# Patient Record
Sex: Female | Born: 1955
Health system: Southern US, Community
[De-identification: ages and names within clinical notes are randomized; demographics above are authoritative.]

## PROBLEM LIST (undated history)

## (undated) DIAGNOSIS — C73 Malignant neoplasm of thyroid gland: Secondary | ICD-10-CM

## (undated) DIAGNOSIS — T8859XA Other complications of anesthesia, initial encounter: Secondary | ICD-10-CM

## (undated) DIAGNOSIS — Z9889 Other specified postprocedural states: Secondary | ICD-10-CM

## (undated) DIAGNOSIS — E039 Hypothyroidism, unspecified: Secondary | ICD-10-CM

## (undated) DIAGNOSIS — E785 Hyperlipidemia, unspecified: Secondary | ICD-10-CM

## (undated) DIAGNOSIS — D649 Anemia, unspecified: Secondary | ICD-10-CM

## (undated) DIAGNOSIS — J189 Pneumonia, unspecified organism: Secondary | ICD-10-CM

## (undated) DIAGNOSIS — R112 Nausea with vomiting, unspecified: Secondary | ICD-10-CM

## (undated) HISTORY — DX: Anemia, unspecified: D64.9

## (undated) HISTORY — PX: THYROIDECTOMY: SHX17

---

## 1998-01-08 ENCOUNTER — Other Ambulatory Visit: Admission: RE | Admit: 1998-01-08 | Discharge: 1998-01-08 | Payer: Self-pay | Admitting: Obstetrics and Gynecology

## 1998-12-12 ENCOUNTER — Inpatient Hospital Stay (HOSPITAL_COMMUNITY): Admission: EM | Admit: 1998-12-12 | Discharge: 1998-12-15 | Payer: Self-pay | Admitting: Internal Medicine

## 1998-12-12 ENCOUNTER — Encounter: Payer: Self-pay | Admitting: Internal Medicine

## 1999-03-11 ENCOUNTER — Encounter: Admission: RE | Admit: 1999-03-11 | Discharge: 1999-03-11 | Payer: Self-pay | Admitting: Obstetrics and Gynecology

## 1999-03-11 ENCOUNTER — Encounter: Payer: Self-pay | Admitting: Obstetrics and Gynecology

## 1999-05-25 ENCOUNTER — Other Ambulatory Visit: Admission: RE | Admit: 1999-05-25 | Discharge: 1999-05-25 | Payer: Self-pay | Admitting: Obstetrics and Gynecology

## 1999-07-09 ENCOUNTER — Encounter: Admission: RE | Admit: 1999-07-09 | Discharge: 1999-07-09 | Payer: Self-pay | Admitting: Urology

## 1999-07-09 ENCOUNTER — Encounter: Payer: Self-pay | Admitting: Urology

## 2000-06-21 ENCOUNTER — Other Ambulatory Visit: Admission: RE | Admit: 2000-06-21 | Discharge: 2000-06-21 | Payer: Self-pay | Admitting: Obstetrics and Gynecology

## 2000-08-23 ENCOUNTER — Other Ambulatory Visit: Admission: RE | Admit: 2000-08-23 | Discharge: 2000-08-23 | Payer: Self-pay | Admitting: Endocrinology

## 2001-07-11 ENCOUNTER — Other Ambulatory Visit: Admission: RE | Admit: 2001-07-11 | Discharge: 2001-07-11 | Payer: Self-pay | Admitting: Obstetrics and Gynecology

## 2002-02-27 ENCOUNTER — Encounter: Admission: RE | Admit: 2002-02-27 | Discharge: 2002-02-27 | Payer: Self-pay | Admitting: Obstetrics and Gynecology

## 2002-02-27 ENCOUNTER — Encounter: Payer: Self-pay | Admitting: Obstetrics and Gynecology

## 2002-09-10 ENCOUNTER — Other Ambulatory Visit: Admission: RE | Admit: 2002-09-10 | Discharge: 2002-09-10 | Payer: Self-pay | Admitting: Obstetrics and Gynecology

## 2003-03-20 ENCOUNTER — Encounter: Admission: RE | Admit: 2003-03-20 | Discharge: 2003-03-20 | Payer: Self-pay | Admitting: Obstetrics and Gynecology

## 2003-09-26 ENCOUNTER — Other Ambulatory Visit: Admission: RE | Admit: 2003-09-26 | Discharge: 2003-09-26 | Payer: Self-pay | Admitting: Obstetrics and Gynecology

## 2004-03-30 ENCOUNTER — Encounter: Admission: RE | Admit: 2004-03-30 | Discharge: 2004-03-30 | Payer: Self-pay | Admitting: Obstetrics and Gynecology

## 2004-09-23 ENCOUNTER — Other Ambulatory Visit: Admission: RE | Admit: 2004-09-23 | Discharge: 2004-09-23 | Payer: Self-pay | Admitting: Obstetrics and Gynecology

## 2005-07-29 IMAGING — CT CT CHEST W/O CM
2 of 6 series · 15 of 37 positions shown, 18 images · non-contrast
Comparison: NONE

CLINICAL DATA: Cough since [REDACTED].  Chest pain. 

CT CHEST FOR EVALUATION OF PULMONARY EMBOLI
TECHNIQUE: Spiral 3 millimeter thick axial images at 
3 mm increments were obtained from the lung apex through the upper 
abdomen after the administration of 73 cc of IV contrast.  Coronal 
reconstructions were obtained.

[Series 5: arterial 2x2 · axial · arterial · 0.68mm/px · z∈[+1437,+1715]mm · 12 of 167 slices shown, 15 images]
[im 14/167  mediastinal]
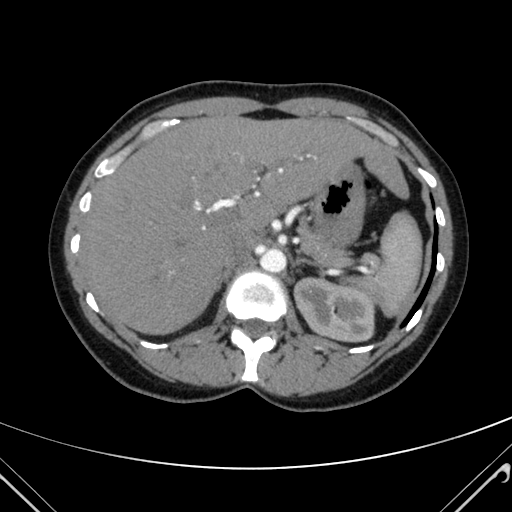
[im 14/167  lung]
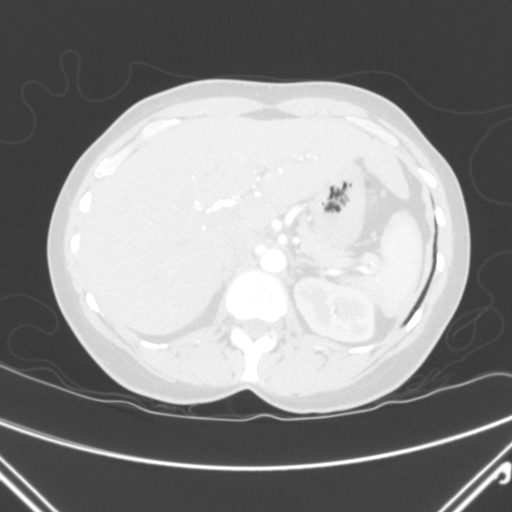
[im 28/167  lung]
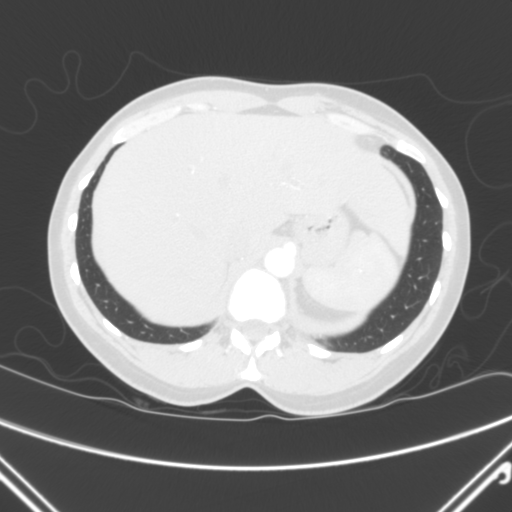
[im 42/167  lung]
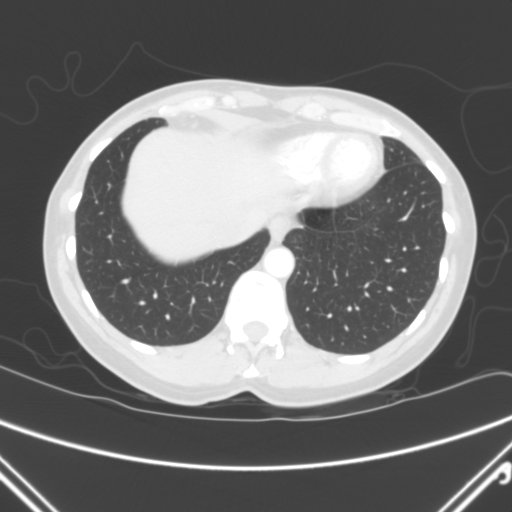
[im 56/167  lung]
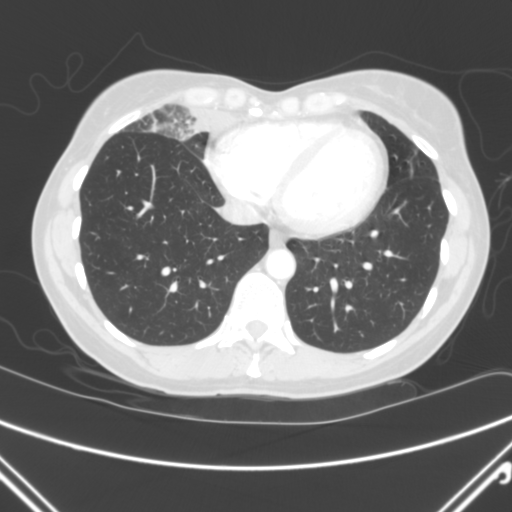
[im 70/167  mediastinal]
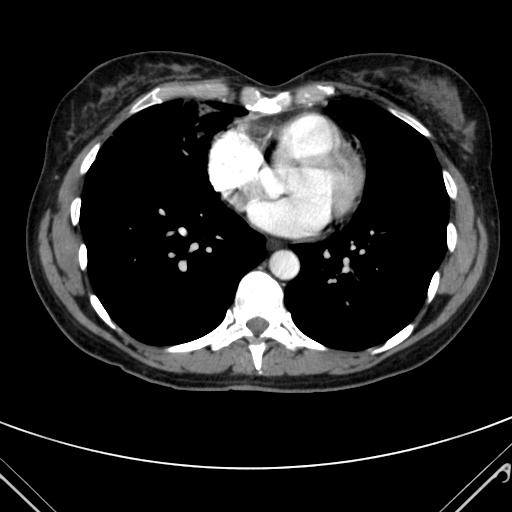
[im 70/167  lung]
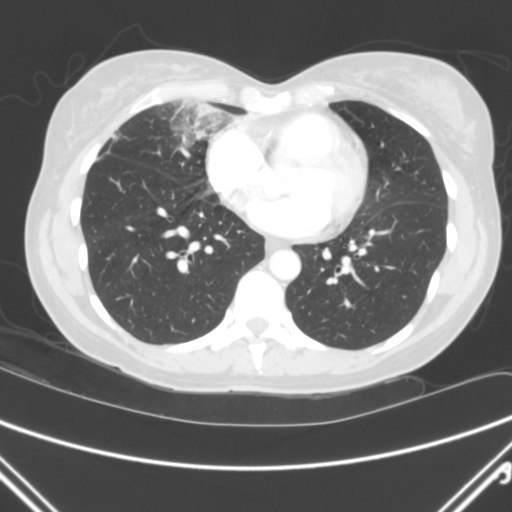
[im 81/167  lung]
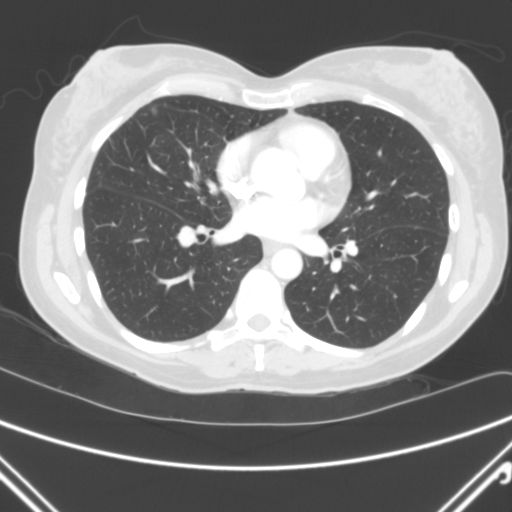
[im 84/167  lung]
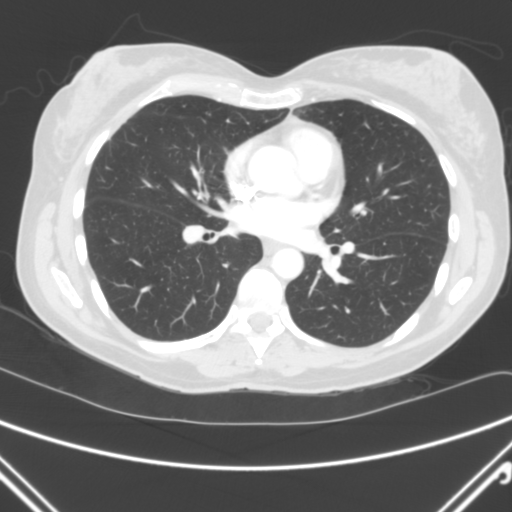
[im 97/167  lung]
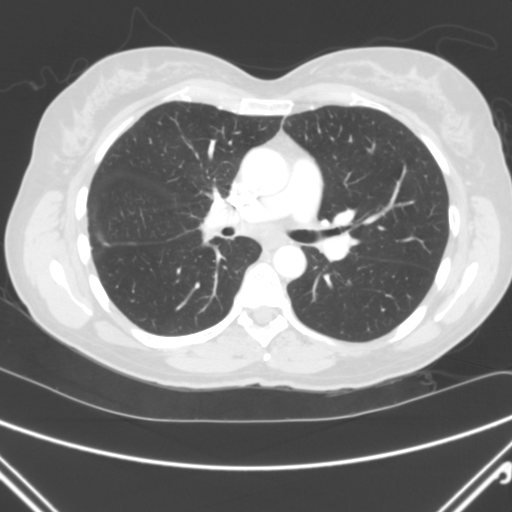
[im 111/167  mediastinal]
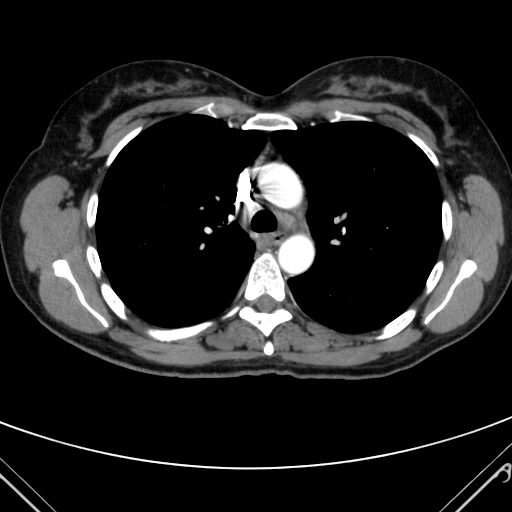
[im 111/167  lung]
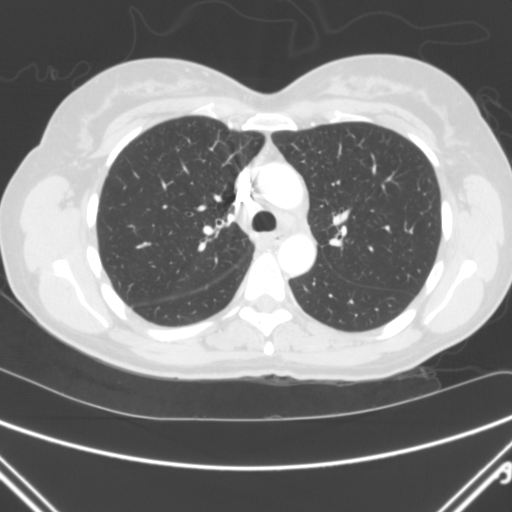
[im 125/167  lung]
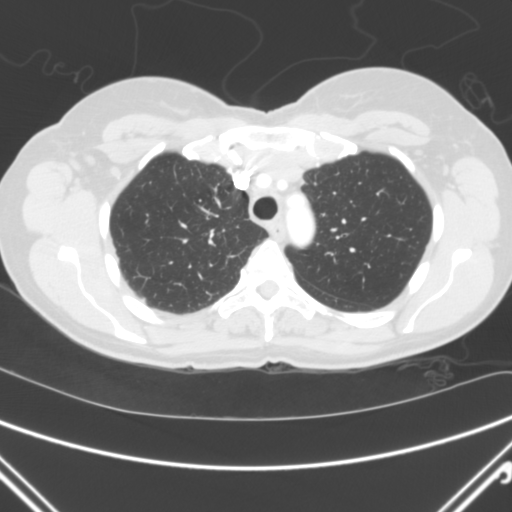
[im 139/167  lung]
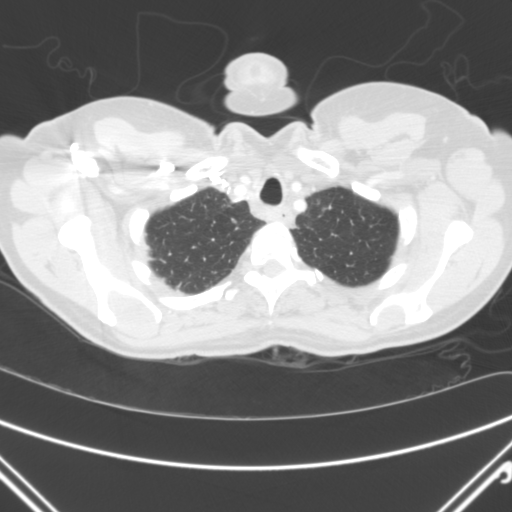
[im 153/167  lung]
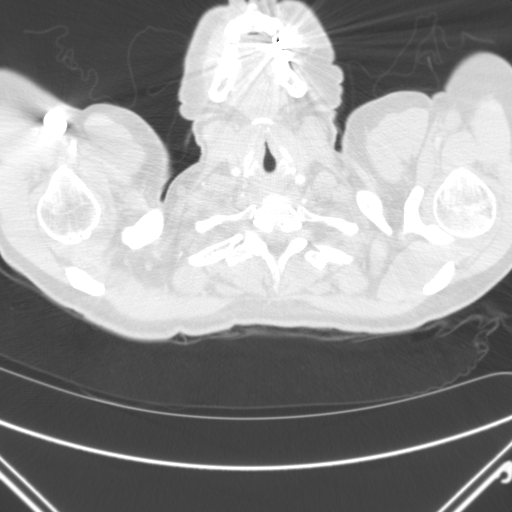

[coronals · coronal · 0.68mm/px · 3 of 102 slices shown]
[im 21/102  lung]
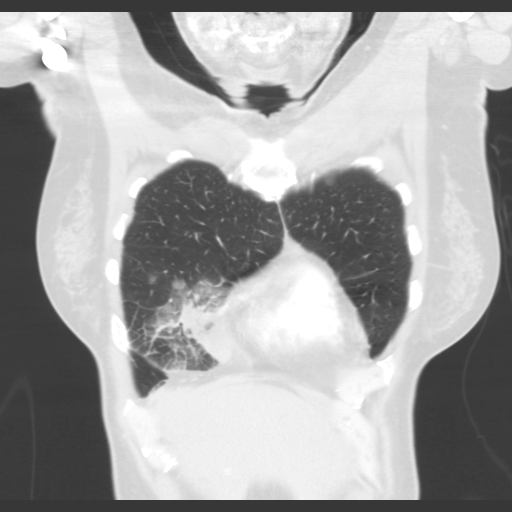
[im 41/102  lung]
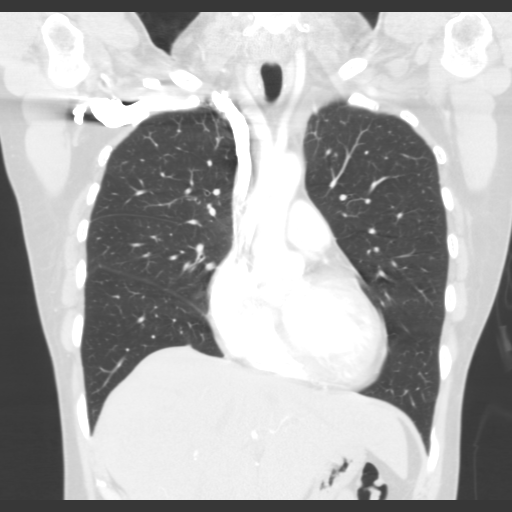
[im 61/102  lung]
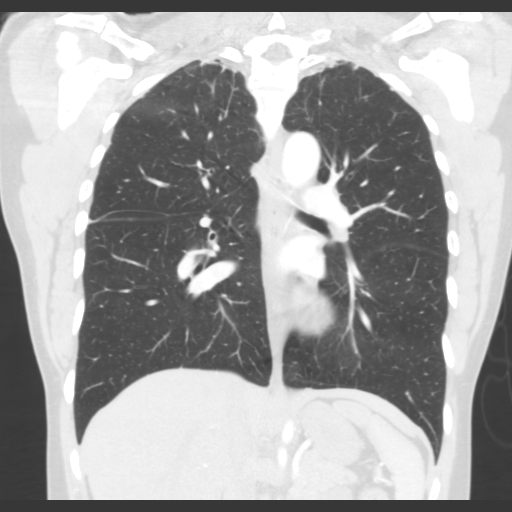

[15 of 37 positions shown; findings below may reference images not displayed]

FINDINGS: No axillary, mediastinal, or hilar mass or adenopathy is 
noted. Visualization of the pulmonary artery and the right and 
left pulmonary artery branches and primary pulmonary artery 
branches demonstrates no evidence of filling defect to suggest 
thromboembolic disease. The dominant finding is an area of 
parenchymal consolidation with minimal air bronchogram effect in 
the right middle lobe.  Corresponding abnormality is noted on a 
chest radiograph obtained on the same day.  No focal lung nodule 
or pleural effusion is noted.  The visualized portions of the 
upper abdominal structures are unremarkable.  No lytic or blastic 
lesions are identified.
IMPRESSION: Right middle lobe parenchymal process most likely an 
area of pneumonia.  Recommend follow-up chest radiograph and/or 
non-contrast CT in three to four weeks to document complete 
resolution of this process.  Neoplasm is unlikely but is a 
differential diagnostic consideration.  Other possible 
considerations would include an area of pulmonary infarction 
although this is unlikely in the absence of thromboembolic 
phenomenon. BREVAN IRIGOYEN electronically reviewed on 
[DATE] Dict Date: [DATE]  Tran Date: [DATE] DAS  [facility]

## 2005-08-16 ENCOUNTER — Encounter: Admission: RE | Admit: 2005-08-16 | Discharge: 2005-08-16 | Payer: Self-pay | Admitting: Obstetrics and Gynecology

## 2005-08-16 IMAGING — MG MM SCREEN MAMMOGRAM BILATERAL
4 series · 4 of 4 positions shown · non-contrast
Comparison: [DATE].

DG SCREEN MAMMOGRAM BILATERAL
Bilateral CC and MLO view(s) were taken.

SCREENING MAMMOGRAM:

[R CC]
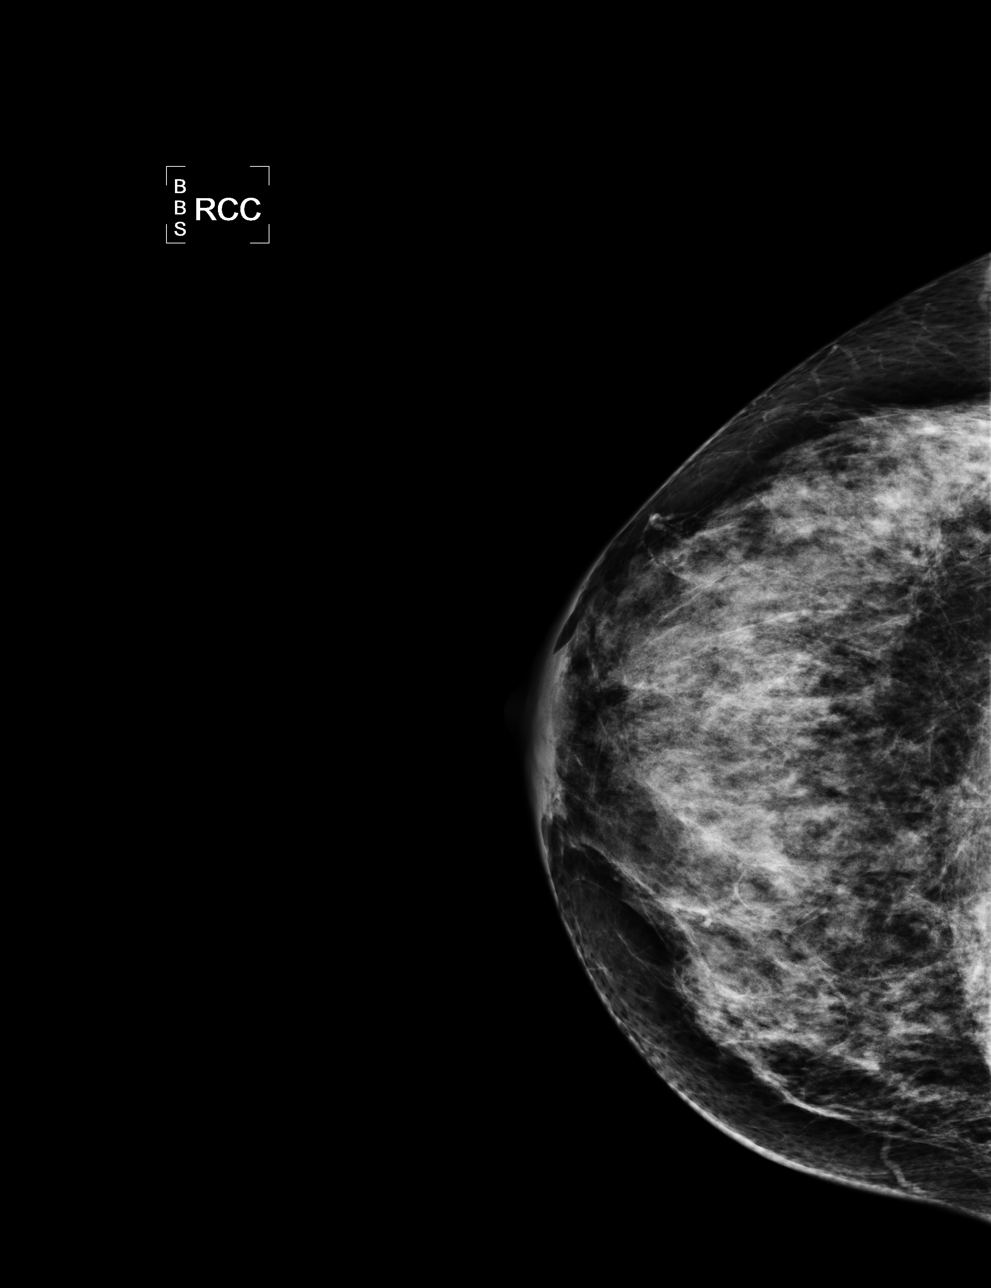

[L CC]
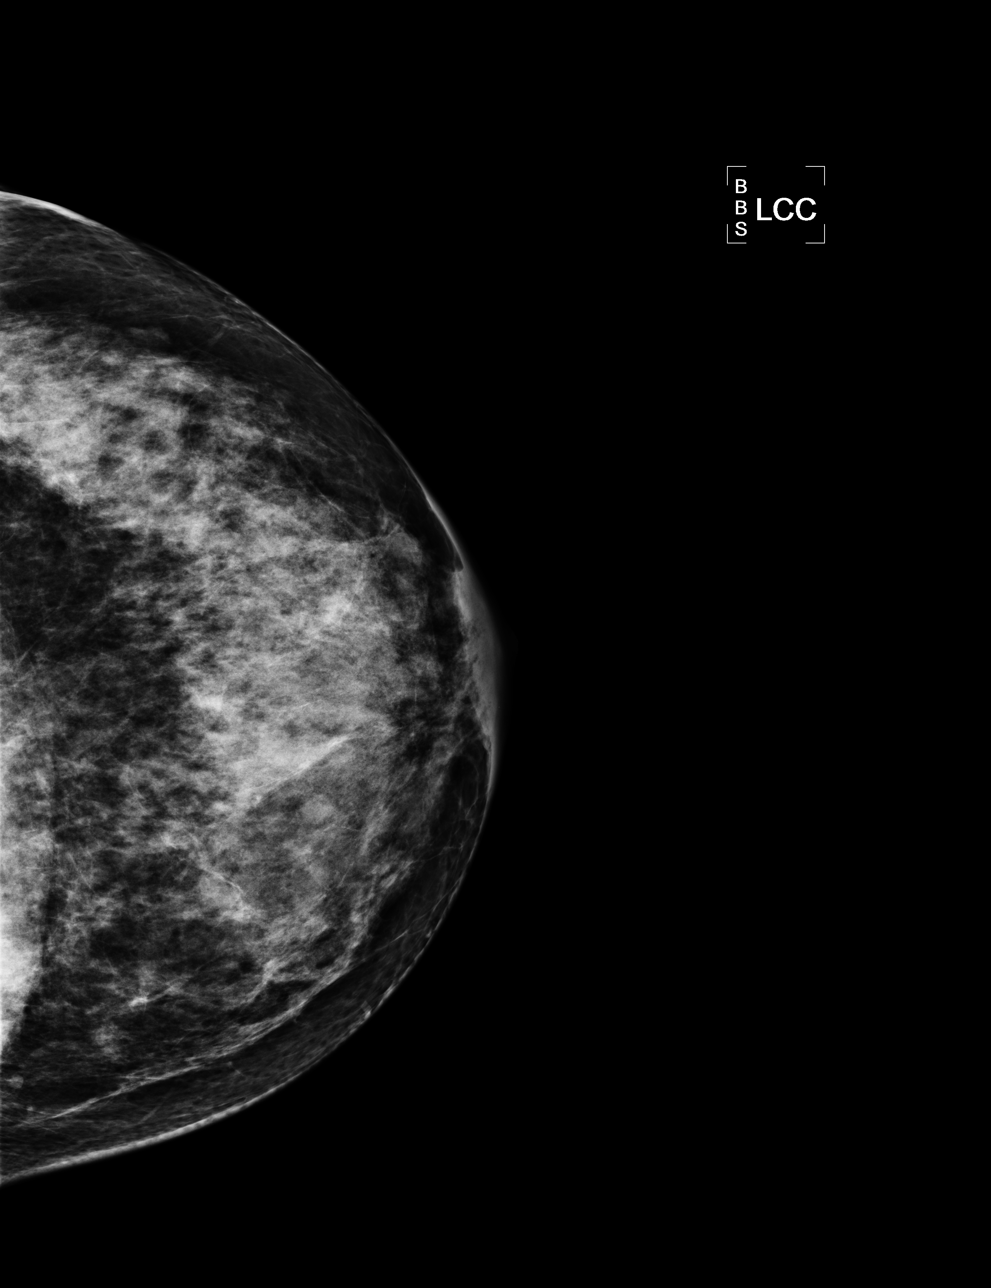

[L MLO]
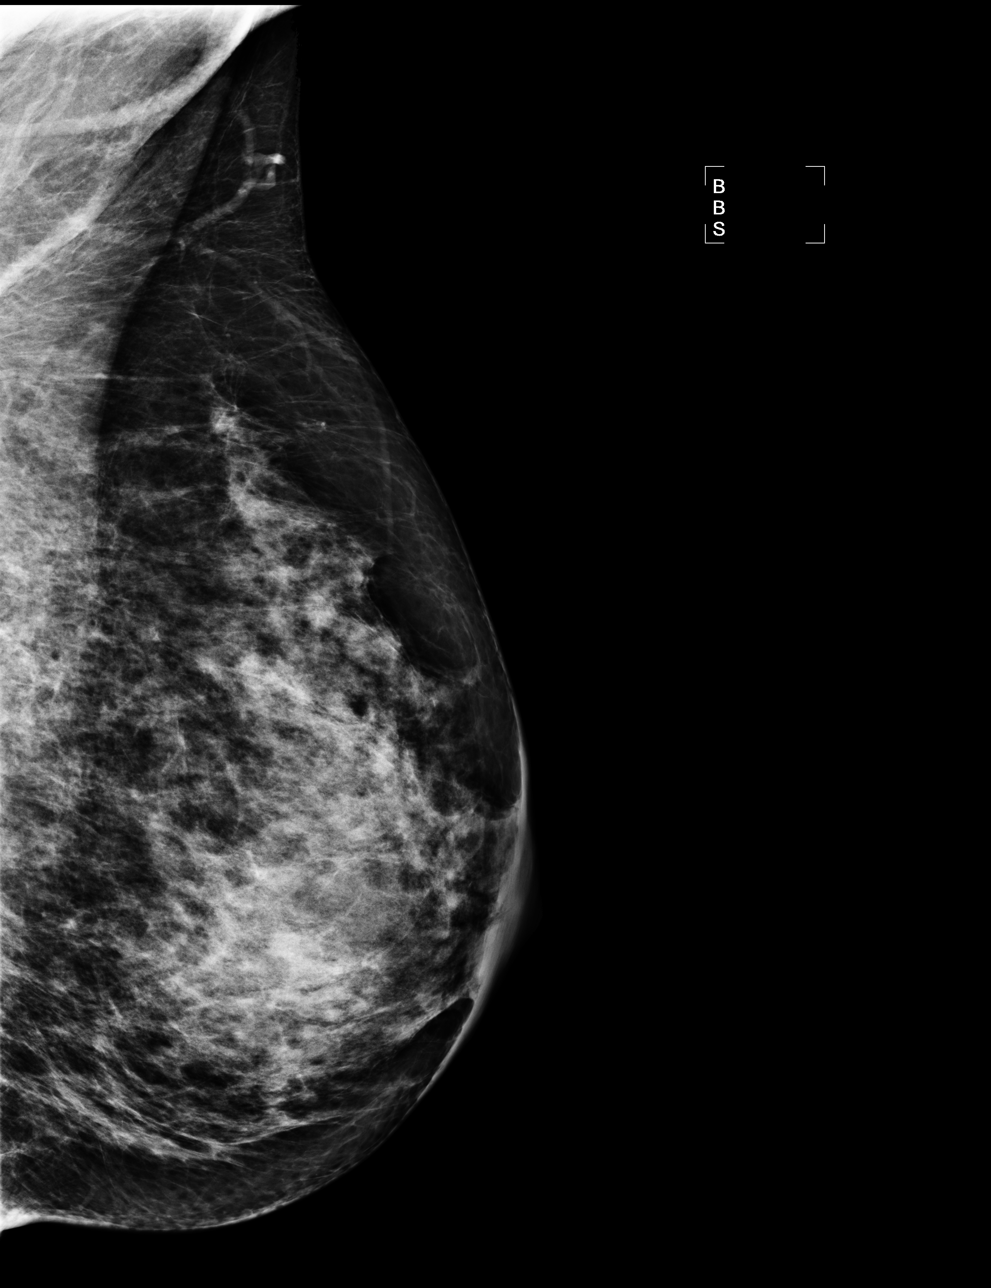

[R MLO]
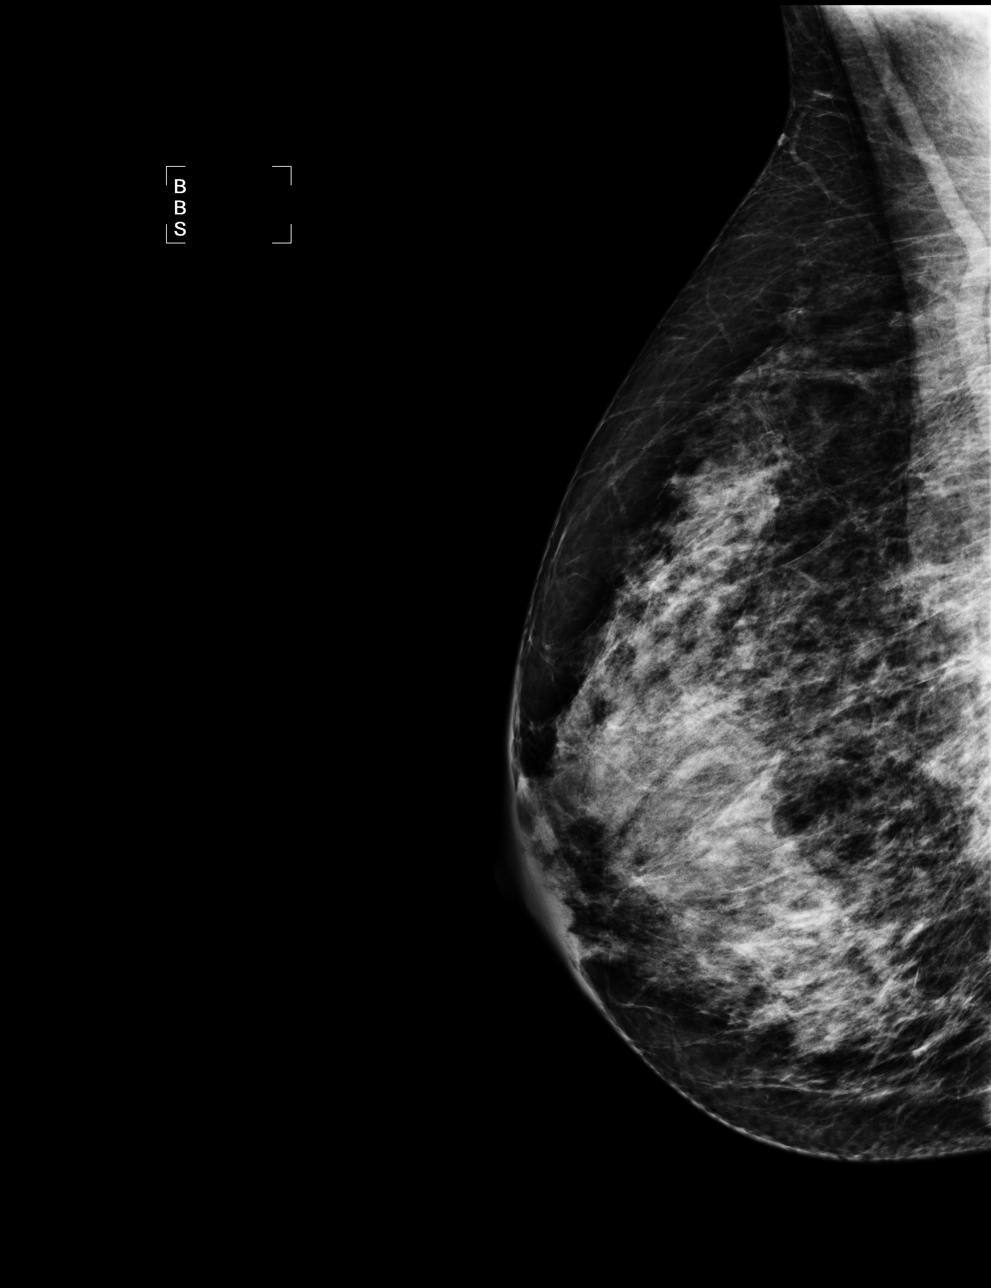

[4 of 4 positions shown; findings below may reference images not displayed]

The breast tissue is extremely dense.  There is no dominant mass, architectural distortion or 
calcification to suggest malignancy.
IMPRESSION: No mammographic evidence of malignancy.  Suggest yearly screening mammography.

ASSESSMENT: Negative - BI-RADS 1

Screening mammogram in 1 year.
ANALYZED BY COMPUTER AIDED DETECTION. , THIS PROCEDURE WAS A DIGITAL MAMMOGRAM.

## 2006-09-14 ENCOUNTER — Encounter: Admission: RE | Admit: 2006-09-14 | Discharge: 2006-09-14 | Payer: Self-pay | Admitting: Obstetrics and Gynecology

## 2006-09-14 IMAGING — MG MM SCREEN MAMMOGRAM BILATERAL
4 series · 4 of 4 positions shown · non-contrast
Comparison: none

DG SCREEN MAMMOGRAM BILATERAL
Bilateral CC and MLO view(s) were taken.
Technologist: CLEMENCEAU

DIGITAL SCREENING MAMMOGRAM WITH CAD:
There is a  dense fibroglandular pattern.  No masses or malignant type calcifications are 
identified.  Compared with prior studies.

[R CC]
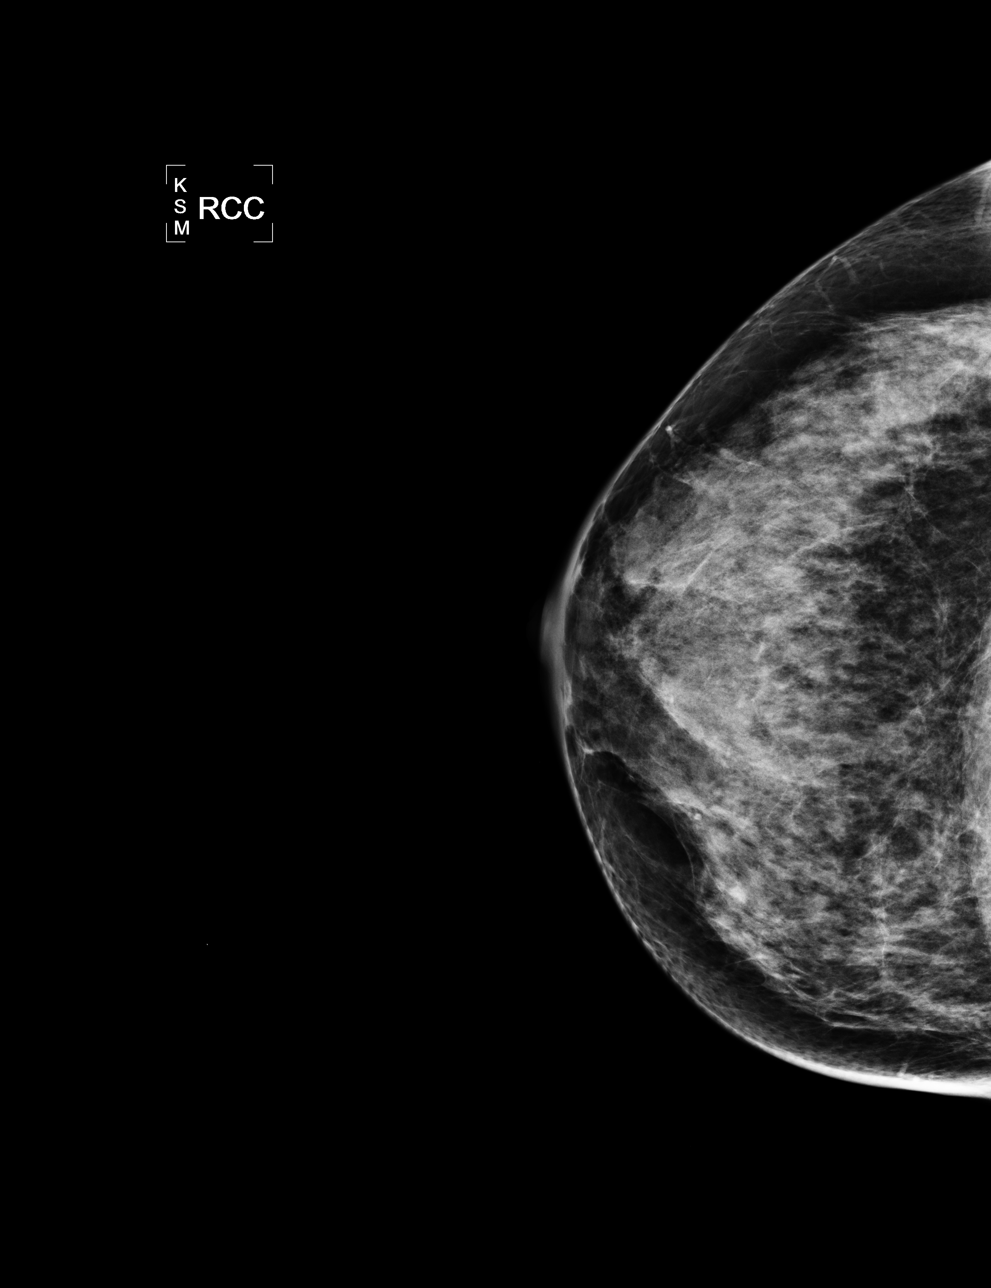

[L CC]
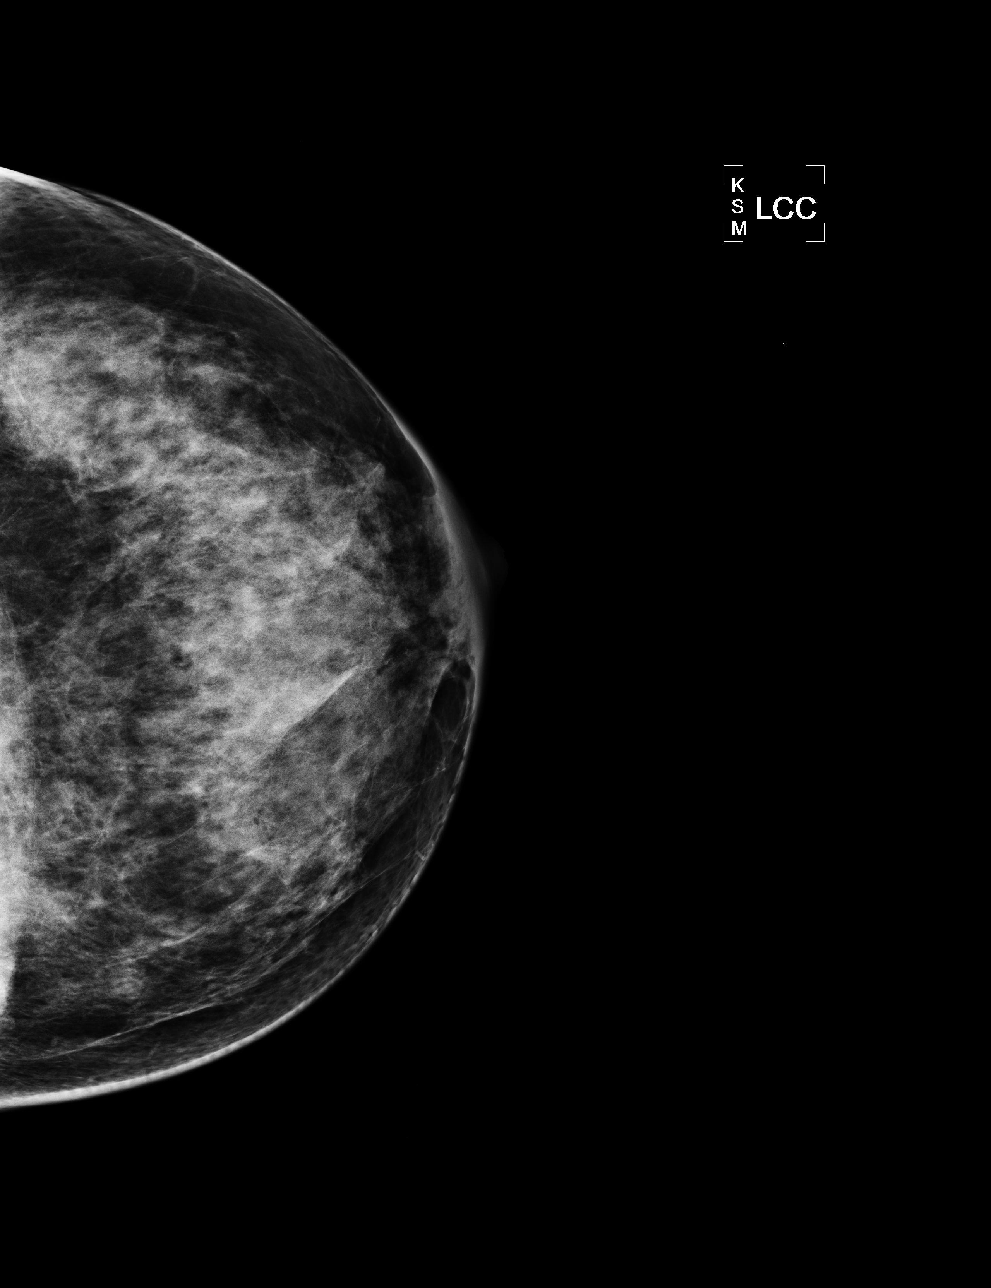

[L MLO]
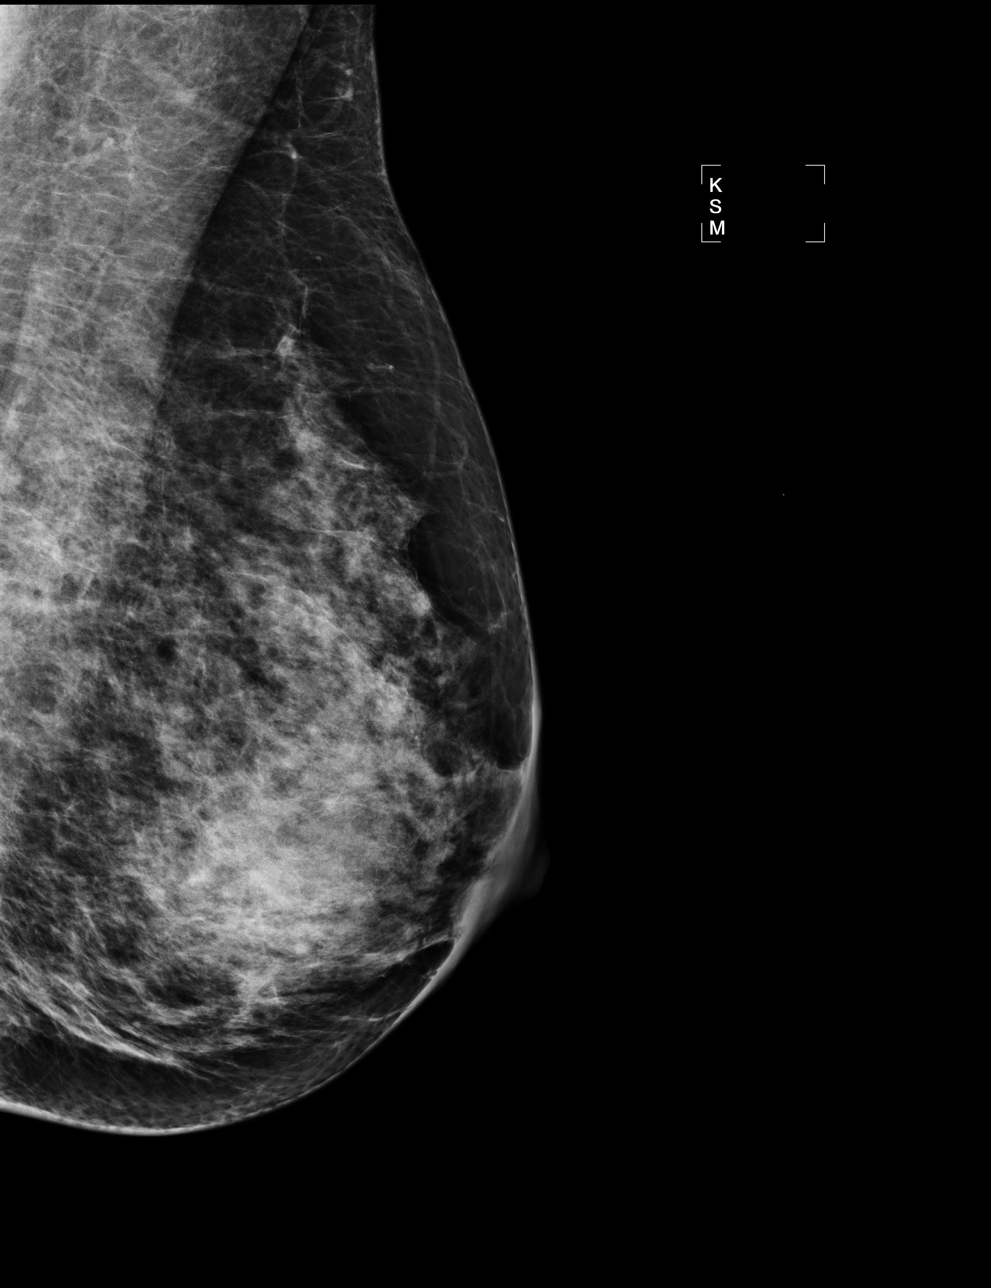

[R MLO]
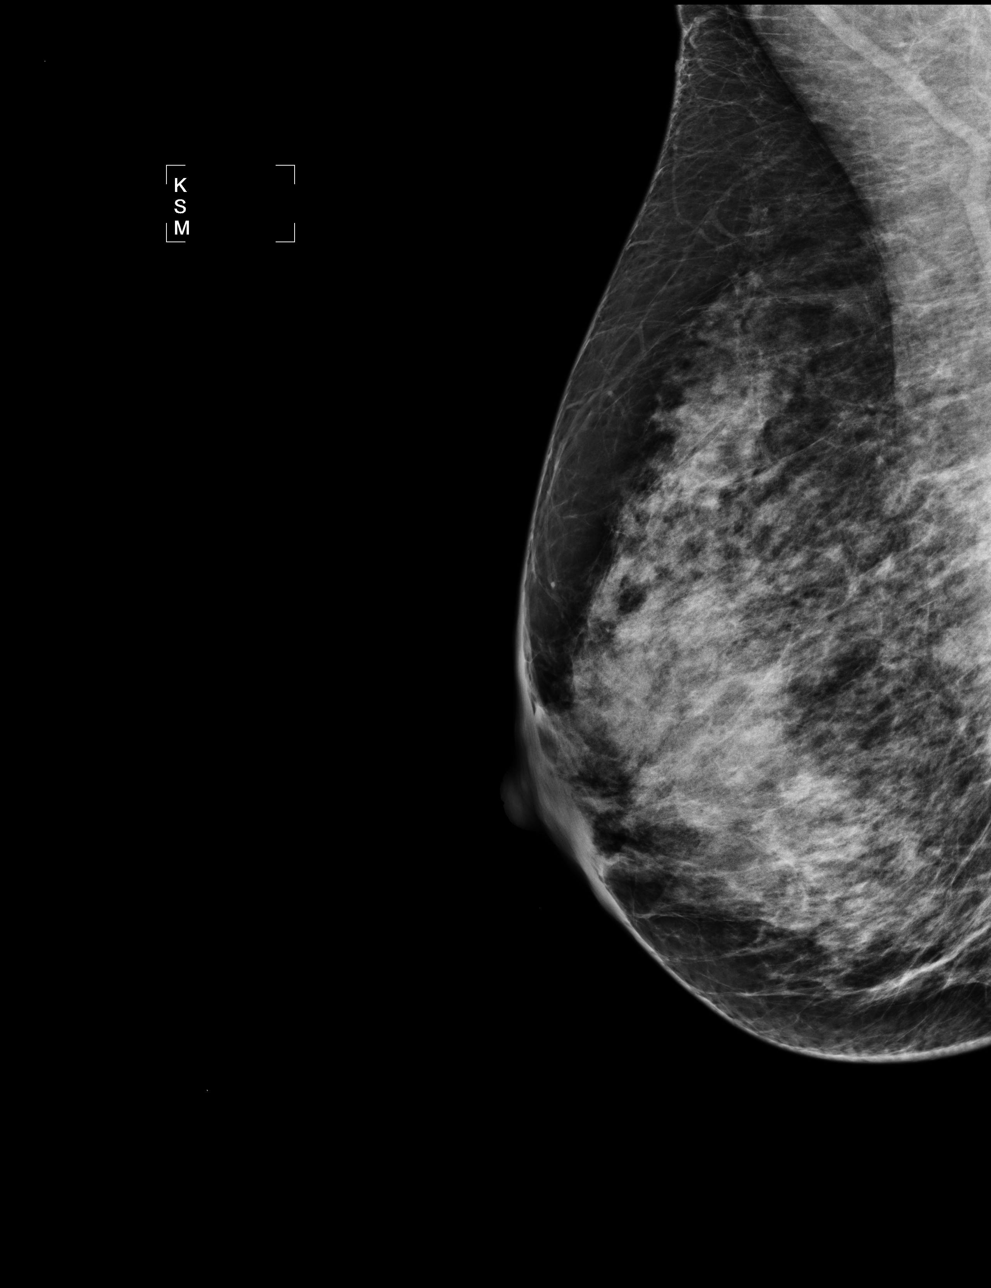

[4 of 4 positions shown; findings below may reference images not displayed]

IMPRESSION: No specific mammographic evidence of malignancy.  Next screening mammogram is recommended in one 
year.

ASSESSMENT: Negative - BI-RADS 1

Screening mammogram in 1 year.
ANALYZED BY COMPUTER AIDED DETECTION. , THIS PROCEDURE WAS A DIGITAL MAMMOGRAM.

## 2007-01-25 ENCOUNTER — Ambulatory Visit: Payer: Self-pay | Admitting: Cardiology

## 2007-02-13 ENCOUNTER — Ambulatory Visit (HOSPITAL_COMMUNITY): Admission: RE | Admit: 2007-02-13 | Discharge: 2007-02-13 | Payer: Self-pay | Admitting: Obstetrics and Gynecology

## 2007-02-13 IMAGING — US US SOFT TISSUE HEAD/NECK
1 series · 14 of 25 positions shown · non-contrast
Comparison: none

CLINICAL DATA: Enlarging thyroid nodule.  
SOFT TISSUE ULTRASOUND - HEAD & NECK- [DATE]:
TECHNIQUE: Ultrasound examination of the soft tissues was performed in the area of clinical concern.

[Series 1: unknown · 0.07mm/px · 14 of 26 slices shown]
[im 1/26]
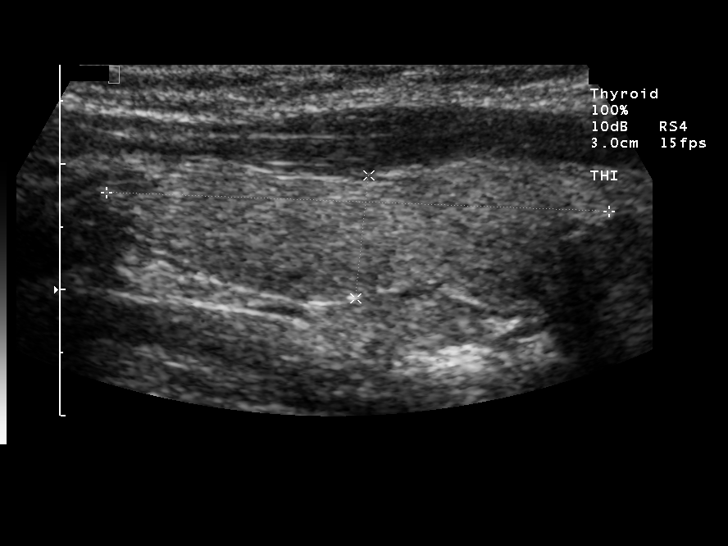
[im 3/26]
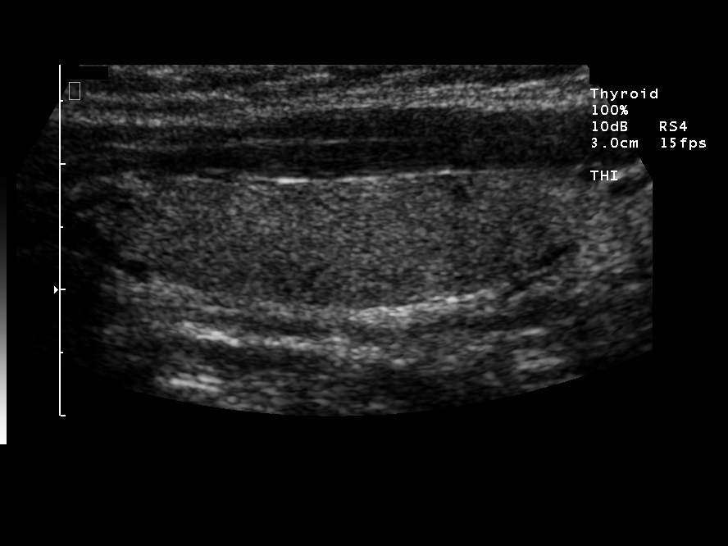
[im 5/26]
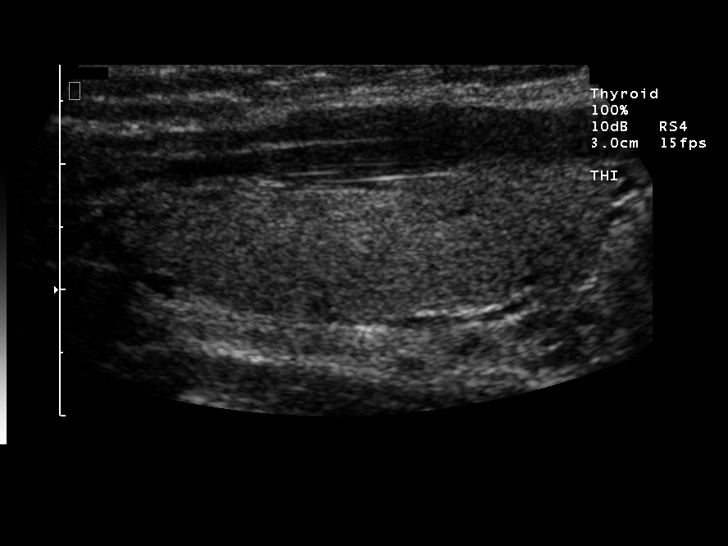
[im 7/26]
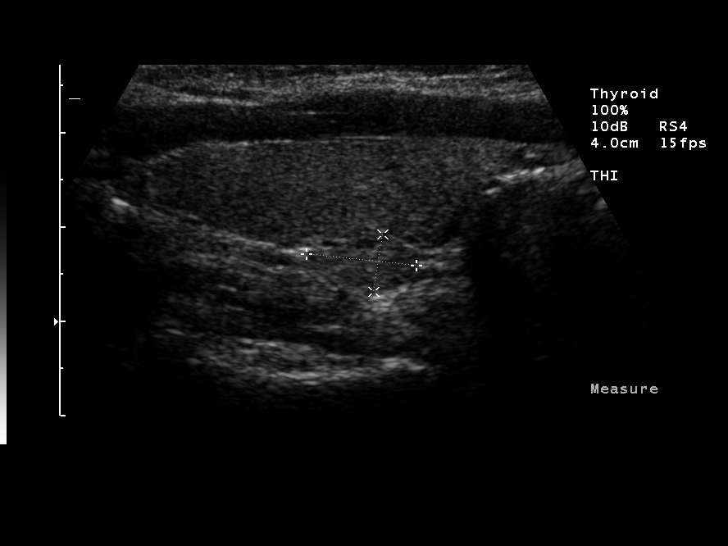
[im 9/26]
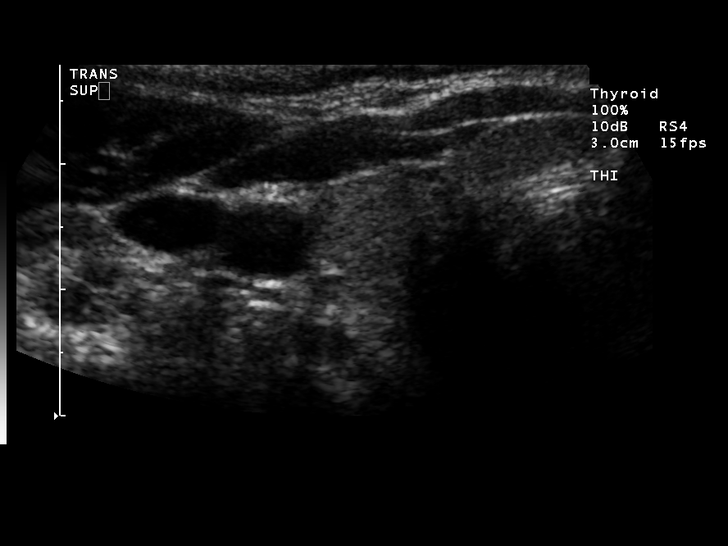
[im 10/26]
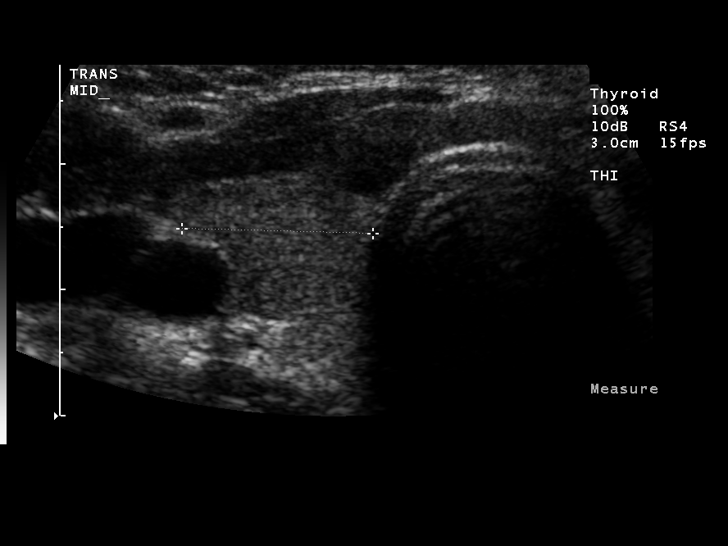
[im 12/26]
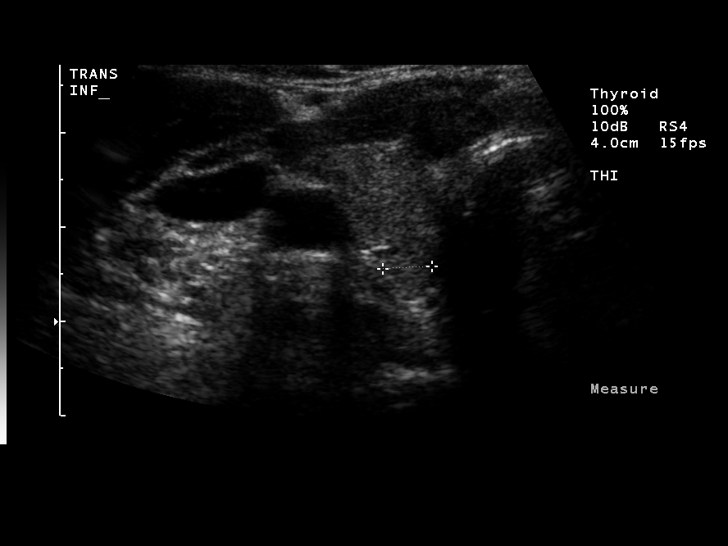
[im 14/26]
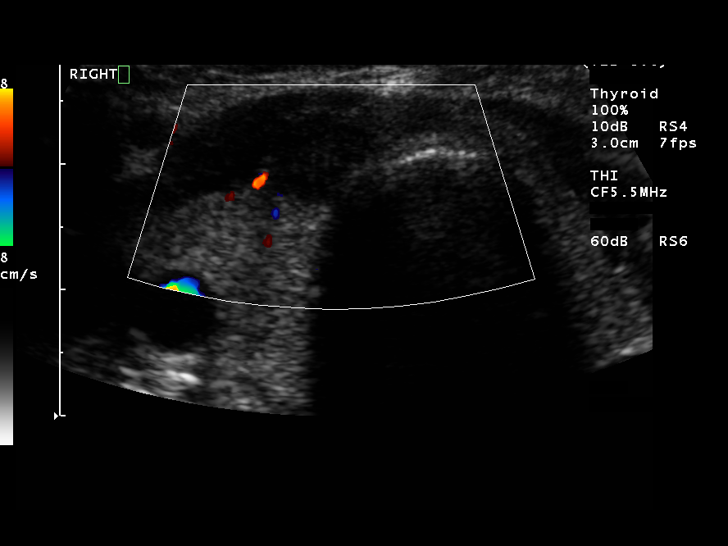
[im 16/26]
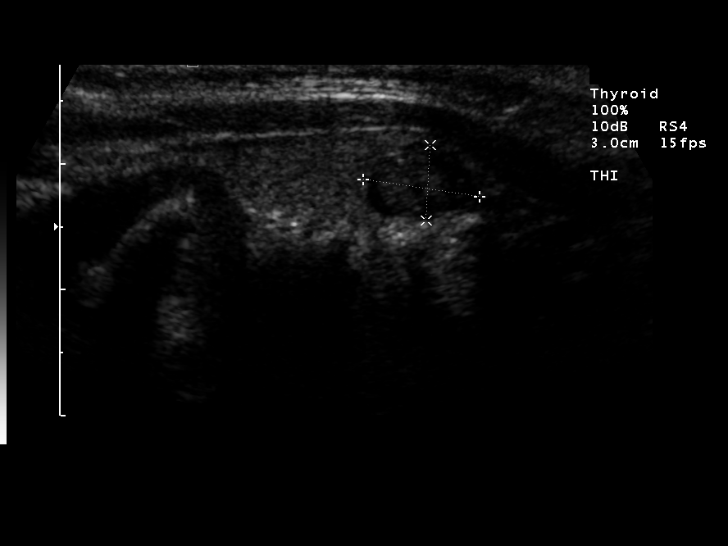
[im 17/26]
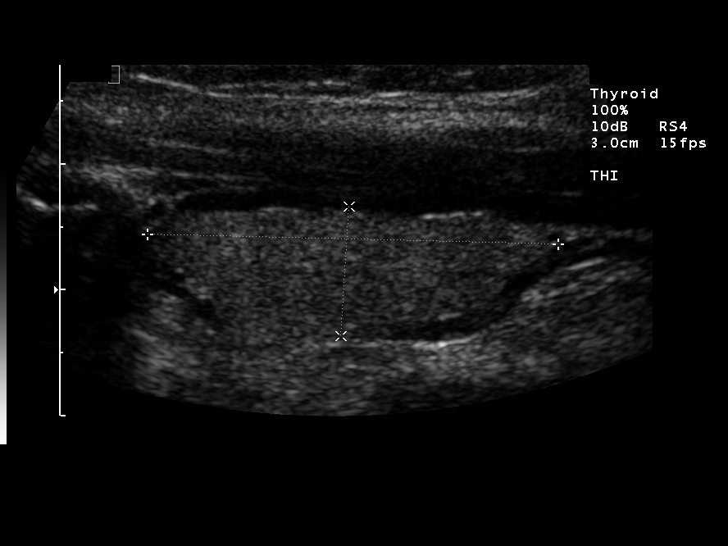
[im 19/26]
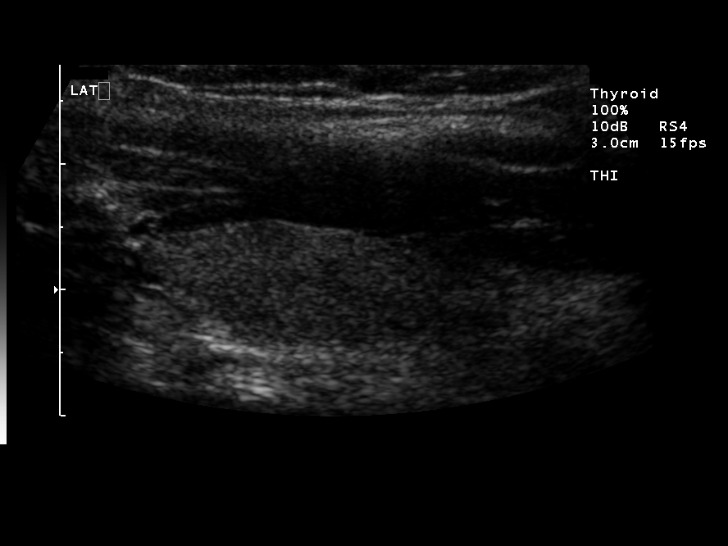
[im 21/26]
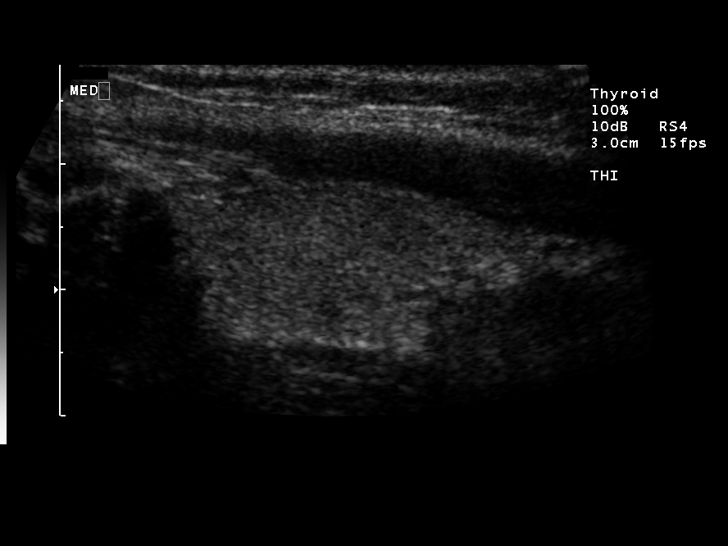
[im 23/26]
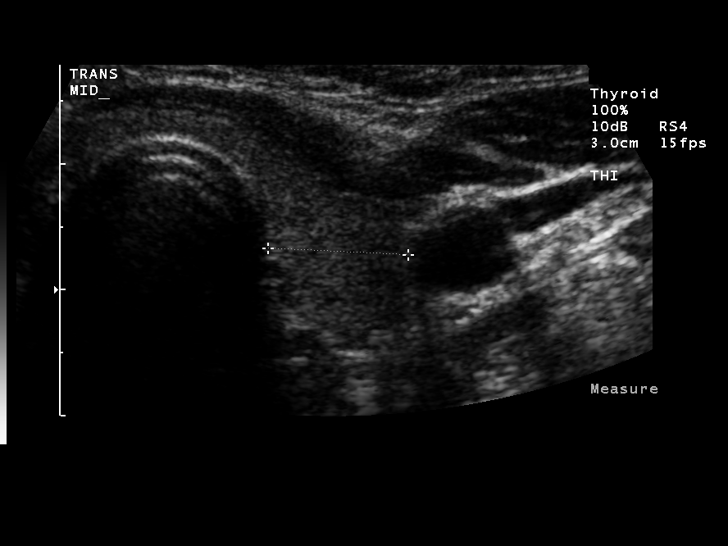
[im 26/26]
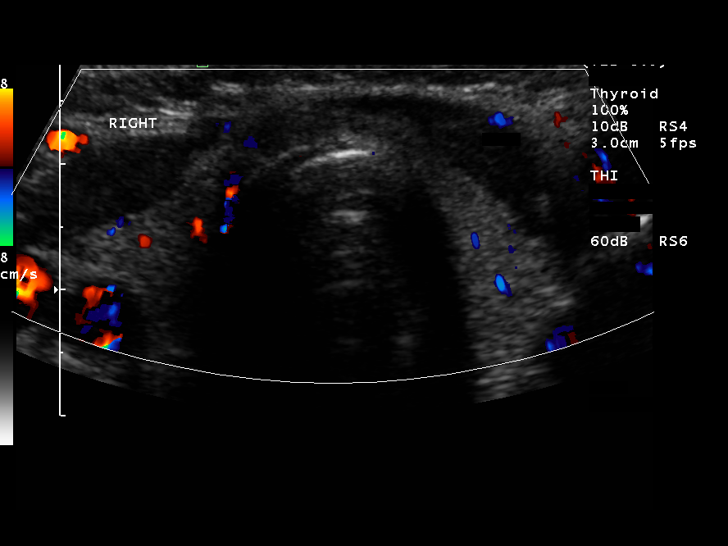

[14 of 25 positions shown; findings below may reference images not displayed]

FINDINGS: There is a 9 x 8 x 6 mm solid nodule in the right side of the thyroid isthmus. There are no other definable nodules in the gland.  There is a small nodule seen posterior to the inferior aspect of the right lobe measuring approximately 11 x 6 x 5 mm in size, probably representing a parathyroid gland.  
The right lobe measures 4.0 x 1.0 x 1.5 cm and the left lobe measures 3.3 x 1.0 x 1.1 cm.    The isthmus is 3 mm thick.
IMPRESSION: Solitary, approximately 9 mm nodule in the right side of the thyroid isthmus.  No other significant abnormality.   Given the solitary nature and dominance and reported enlarging characteristics, fine needle aspiration biopsy may  be indicated.

## 2007-03-30 ENCOUNTER — Encounter: Admission: RE | Admit: 2007-03-30 | Discharge: 2007-03-30 | Payer: Self-pay | Admitting: Surgery

## 2007-03-30 ENCOUNTER — Encounter (INDEPENDENT_AMBULATORY_CARE_PROVIDER_SITE_OTHER): Payer: Self-pay | Admitting: Diagnostic Radiology

## 2007-03-30 ENCOUNTER — Other Ambulatory Visit: Admission: RE | Admit: 2007-03-30 | Discharge: 2007-03-30 | Payer: Self-pay | Admitting: Diagnostic Radiology

## 2007-03-30 IMAGING — US US BIOPSY
1 series · 12 of 12 positions shown · non-contrast
Comparison: none

CLINICAL DATA: Patient with history of enlarging thyroid nodule and thyroid ultrasound at [HOSPITAL] on [DATE] which revealed a 0.9 x 0.8 x 0.6 cm solid nodule in the right side of the thyroid isthmus.  Request is now made for fine needle aspiration of this right thyroid isthmus nodule.
ULTRASOUND-GUIDED FINE NEEDLE ASPIRATION, RIGHT THYROID ISTHMUS NODULE:

[Series 1: us biopsy · 12 acquisitions, 12 frames shown]
[im 1/12]
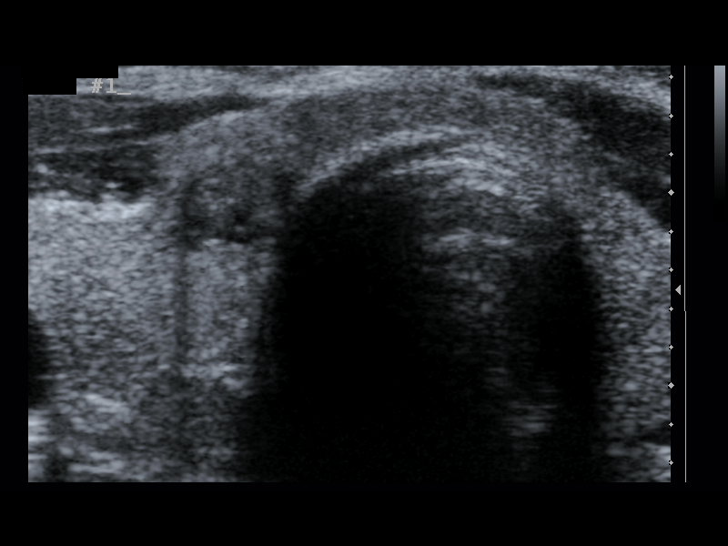
[im 2/12]
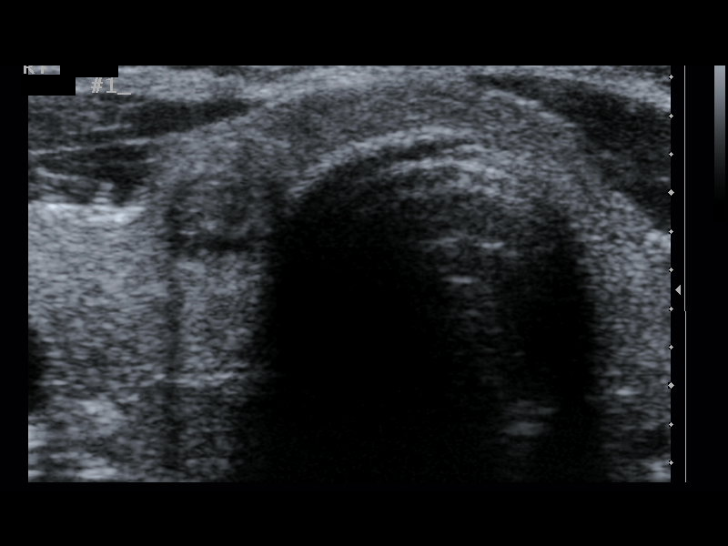
[im 3/12]
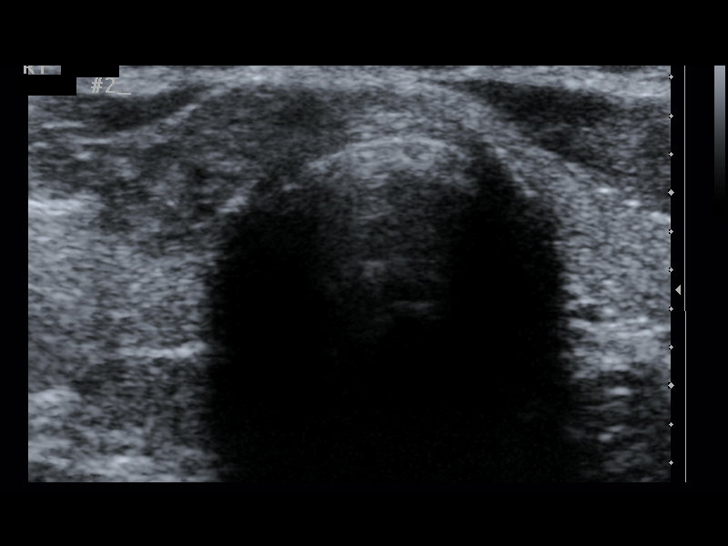
[im 4/12]
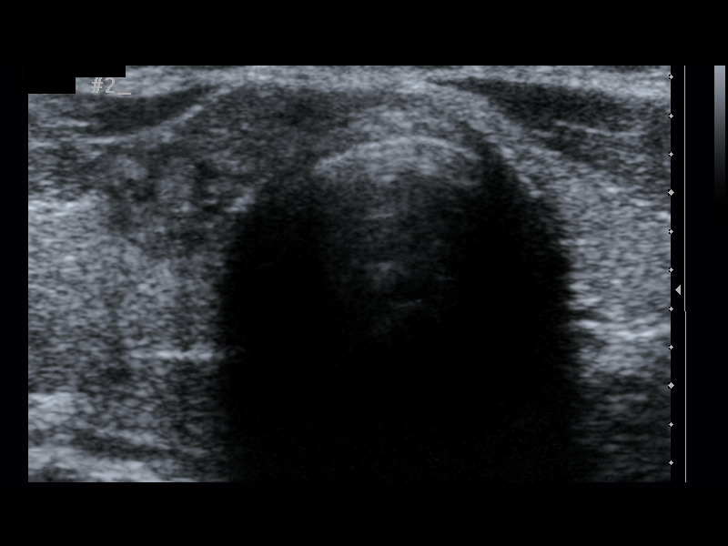
[im 5/12]
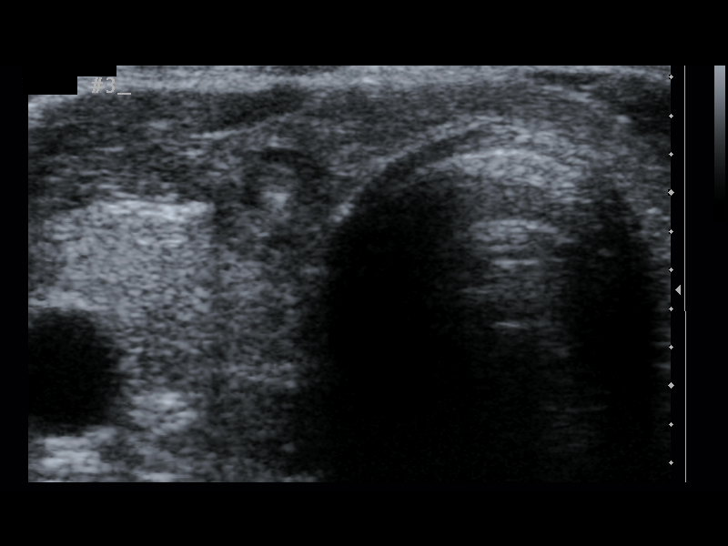
[im 6/12]
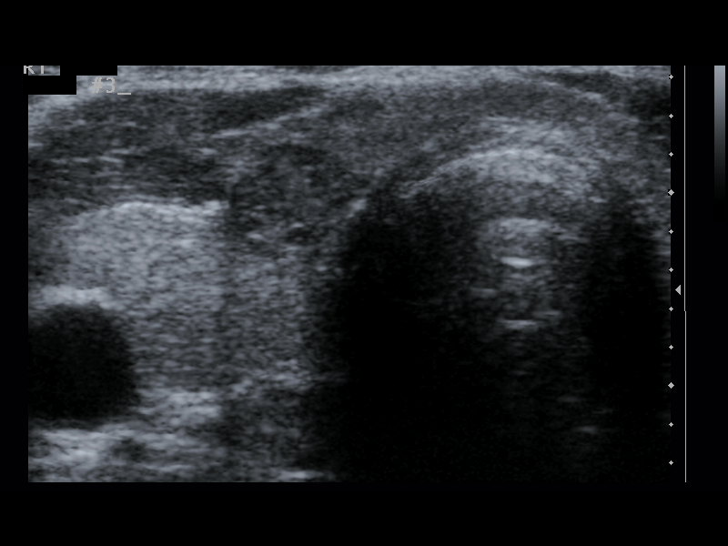
[im 7/12]
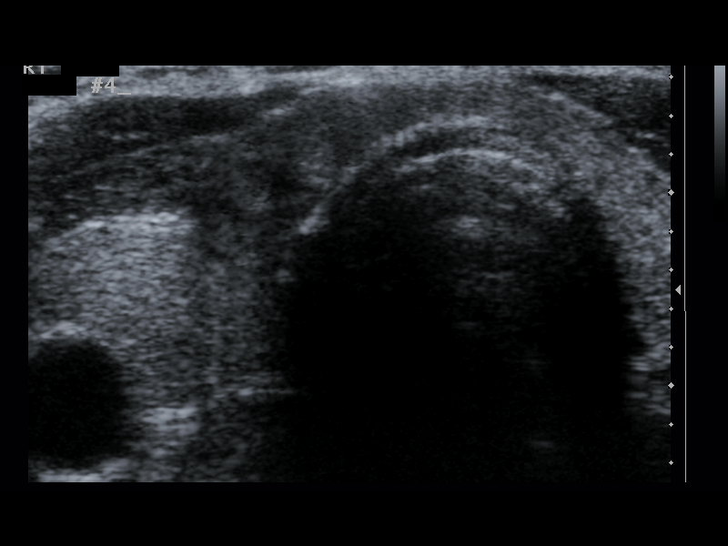
[im 8/12]
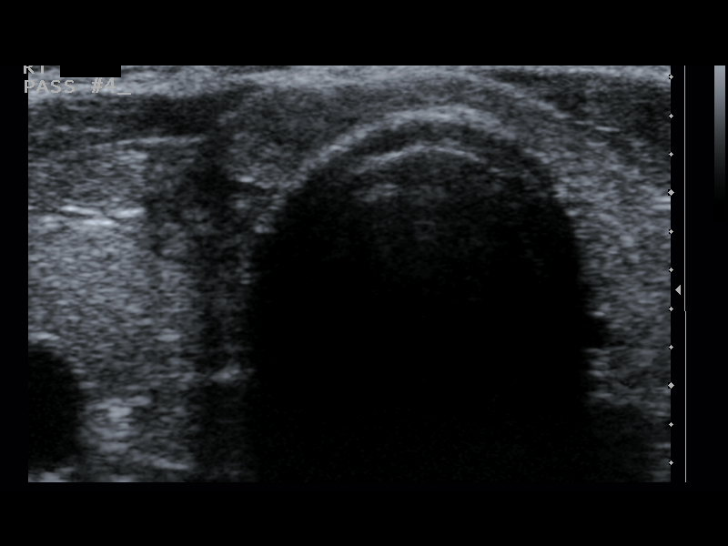
[im 9/12]
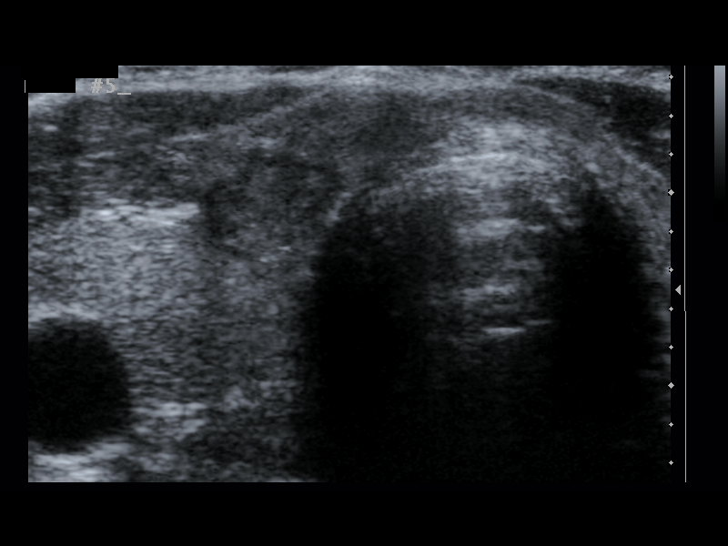
[im 10/12]
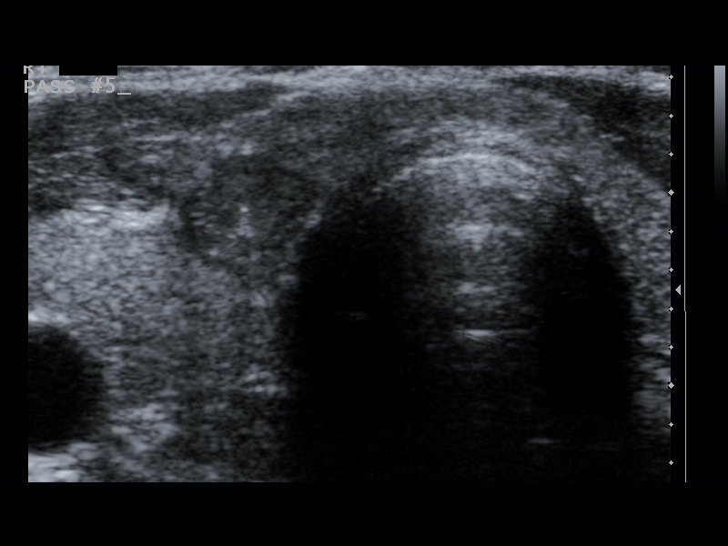
[im 11/12]
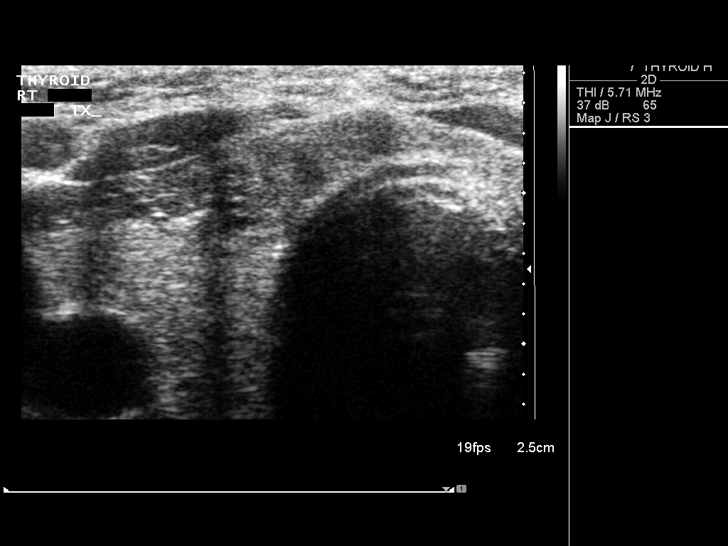
[im 12/12]
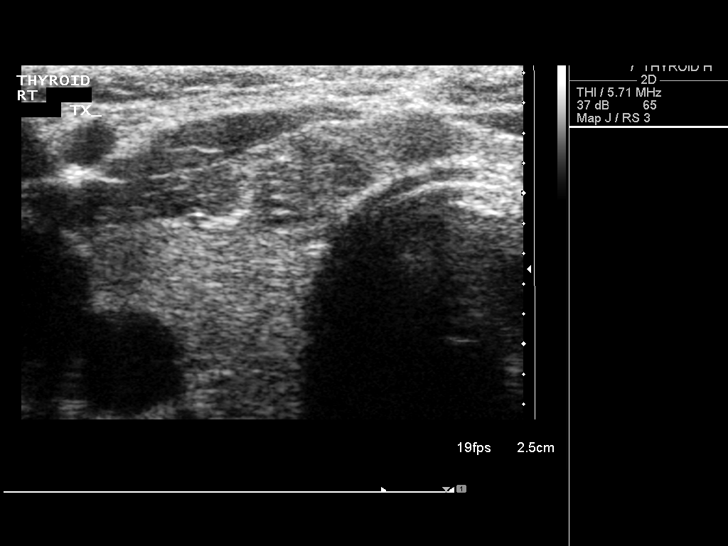

[12 of 12 positions shown; findings below may reference images not displayed]

FINDINGS: The above procedure was thoroughly discussed with the patient and written informed consent was obtained. 
Ultrasound was then performed to localize and mark an adequate site for the biopsy.  The patient was then prepped and draped in a normal sterile fashion.  1% lidocaine was used for local anesthesia.  Under direct ultrasound guidance, five passes were made using  25 gauge hypodermic needles into the nodule located within the right thyroid isthmus.  Ultrasound confirmed placement of the needle on all five occasions.  The specimens were sent to Pathology for further analysis.  Post procedure imaging demonstrated no hematoma or immediate complication.  The patient tolerated the procedure well.
IMPRESSION: Successful ultrasound-guided fine needle aspiration, right thyroid isthmus nodule.  Final pathology pending.

## 2007-06-12 IMAGING — CR DG CHEST 2V
2 series · 2 of 2 positions shown · non-contrast
Comparison: None

CLINICAL DATA: Preop for thyroid nodule

CHEST - 2 VIEW

[w chest pa]
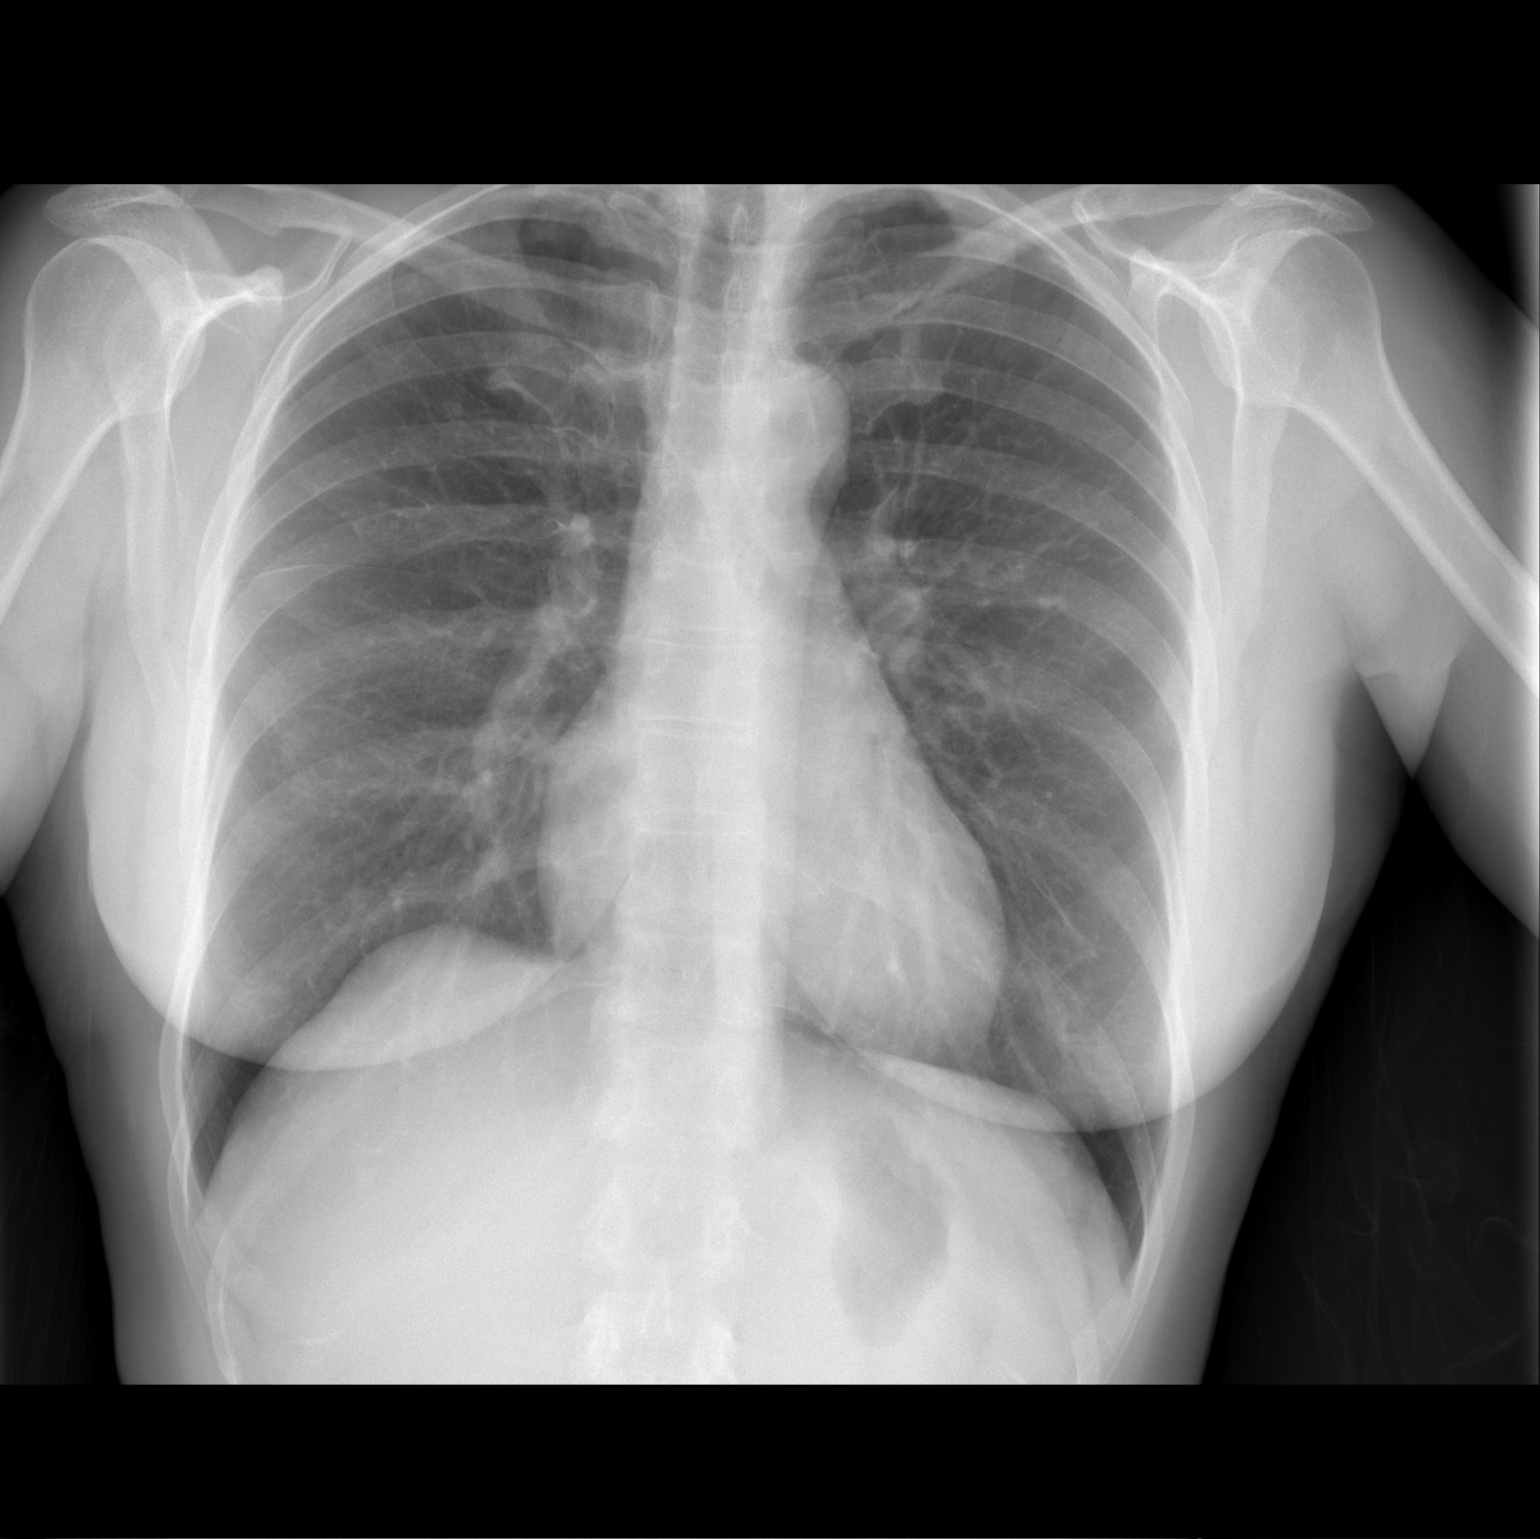

[w chest lat]
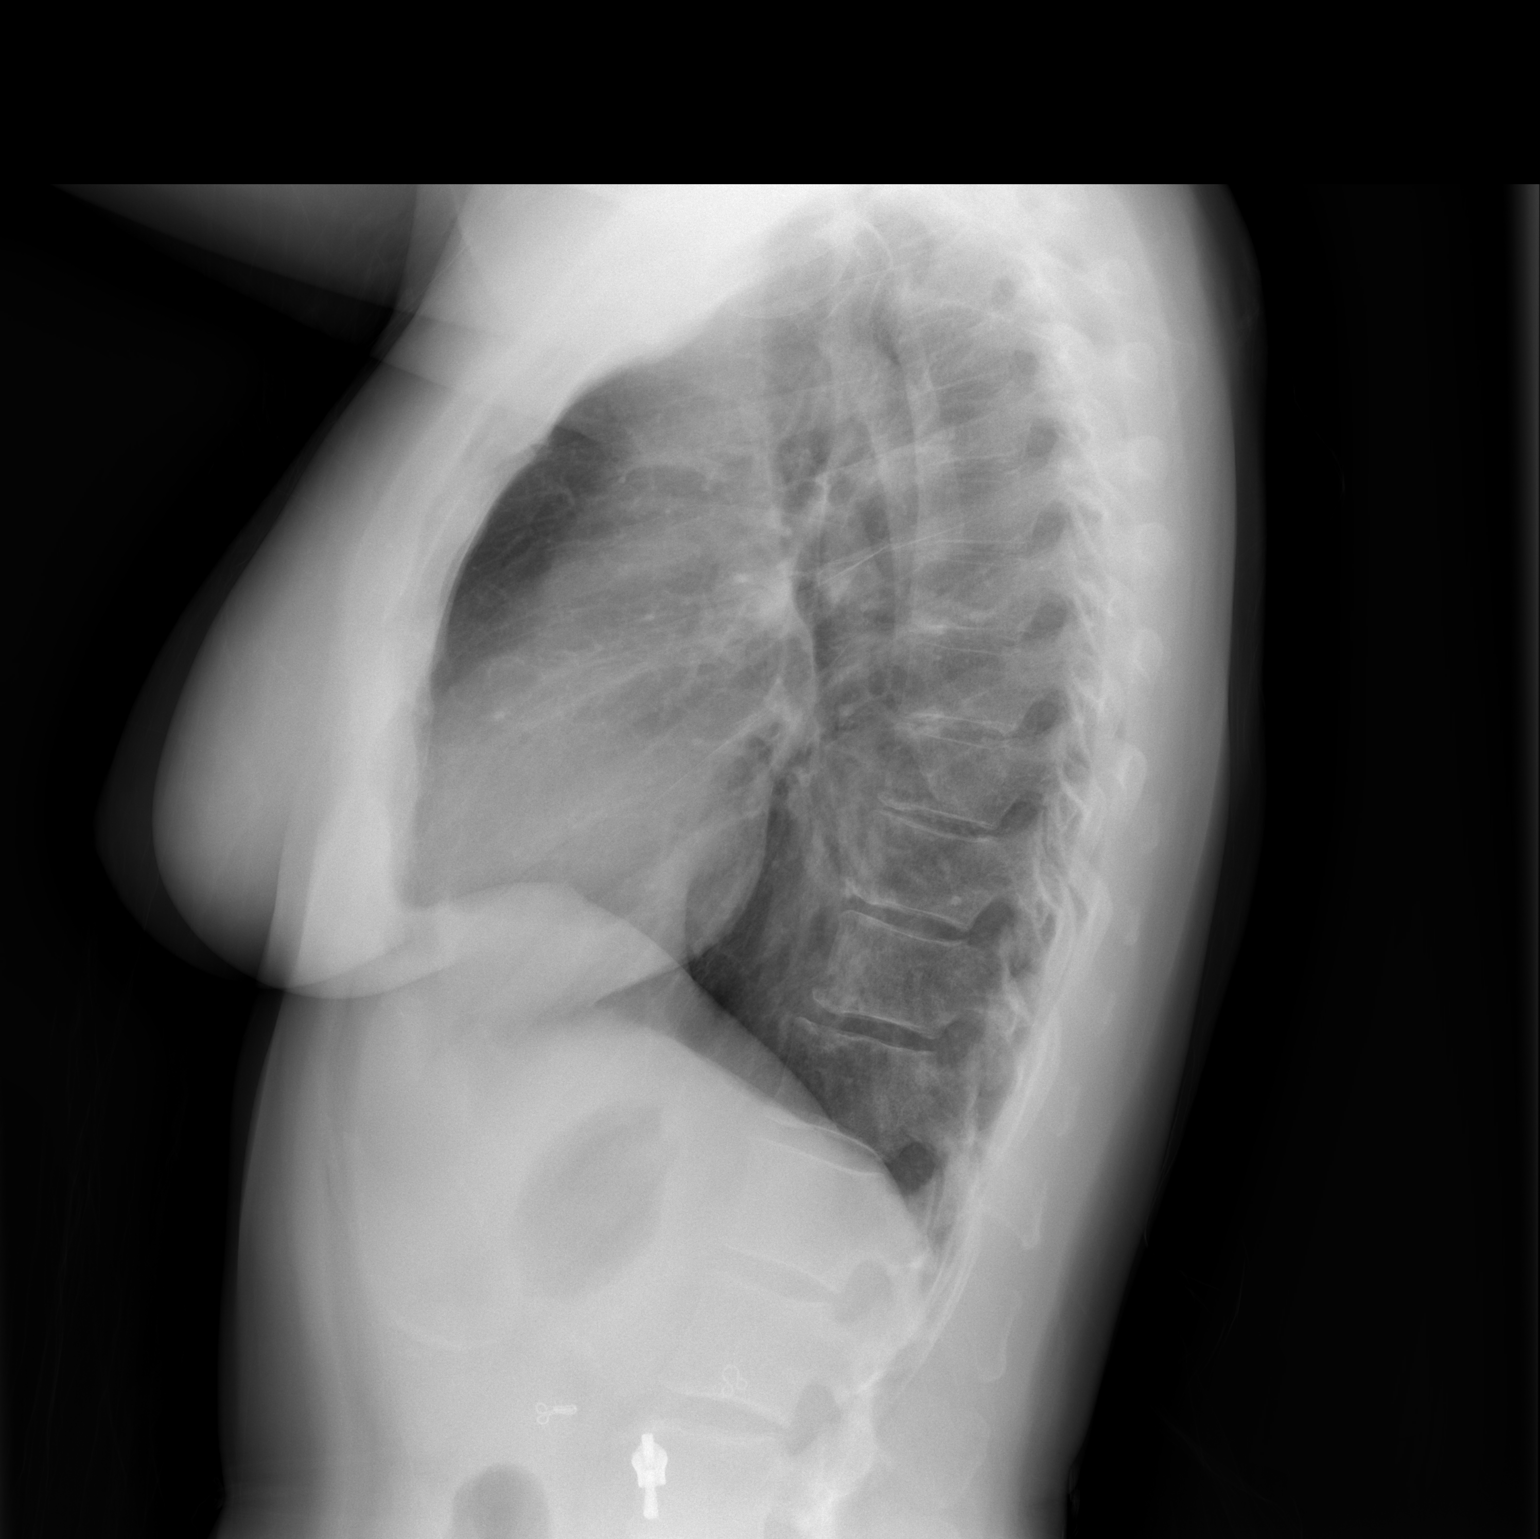

[2 of 2 positions shown; findings below may reference images not displayed]

FINDINGS: Heart and mediastinal contours normal.  Lungs clear.
Osseous structures intact.  No abnormality of the soft tissues.
IMPRESSION: No active disease.

## 2007-06-14 ENCOUNTER — Ambulatory Visit (HOSPITAL_COMMUNITY): Admission: RE | Admit: 2007-06-14 | Discharge: 2007-06-15 | Payer: Self-pay | Admitting: Surgery

## 2007-06-14 ENCOUNTER — Encounter (INDEPENDENT_AMBULATORY_CARE_PROVIDER_SITE_OTHER): Payer: Self-pay | Admitting: Surgery

## 2007-09-13 ENCOUNTER — Ambulatory Visit (HOSPITAL_COMMUNITY): Admission: RE | Admit: 2007-09-13 | Discharge: 2007-09-13 | Payer: Self-pay | Admitting: *Deleted

## 2007-09-13 ENCOUNTER — Encounter (INDEPENDENT_AMBULATORY_CARE_PROVIDER_SITE_OTHER): Payer: Self-pay | Admitting: *Deleted

## 2008-01-18 ENCOUNTER — Ambulatory Visit (HOSPITAL_COMMUNITY): Admission: RE | Admit: 2008-01-18 | Discharge: 2008-01-18 | Payer: Self-pay | Admitting: Endocrinology

## 2008-01-18 IMAGING — US US SOFT TISSUE HEAD/NECK
1 series · 14 of 18 positions shown · non-contrast
Comparison: [DATE], [DATE]

CLINICAL DATA: History of thyroid nodule.  History of right
thyroidectomy.

THYROID ULTRASOUND
TECHNIQUE: Ultrasound examination of the thyroid gland and
adjacent soft tissues was performed.

[Series 1: unknown · 0.09mm/px · 14 of 18 slices shown]
[im 1/18]
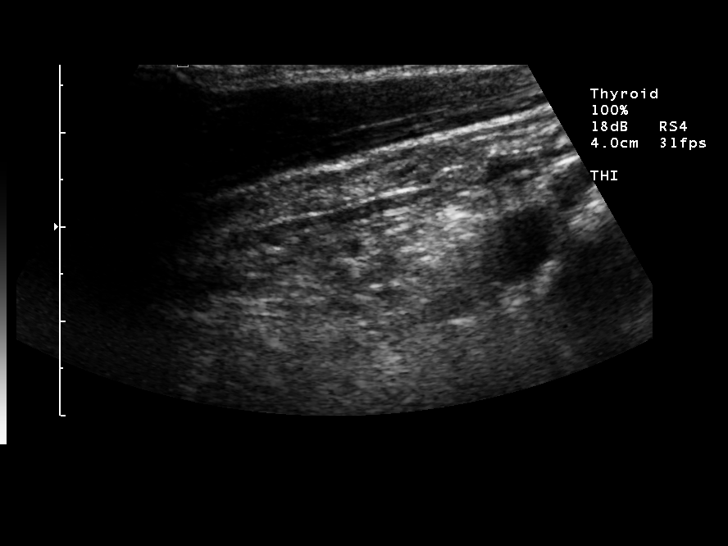
[im 2/18]
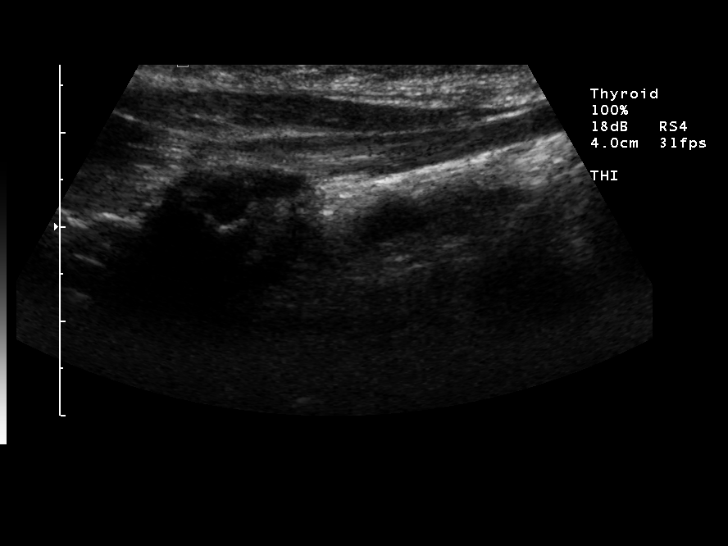
[im 4/18]
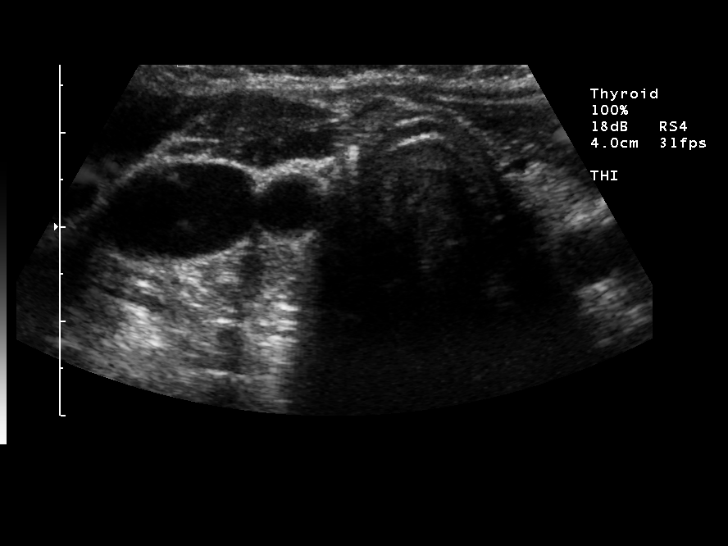
[im 5/18]
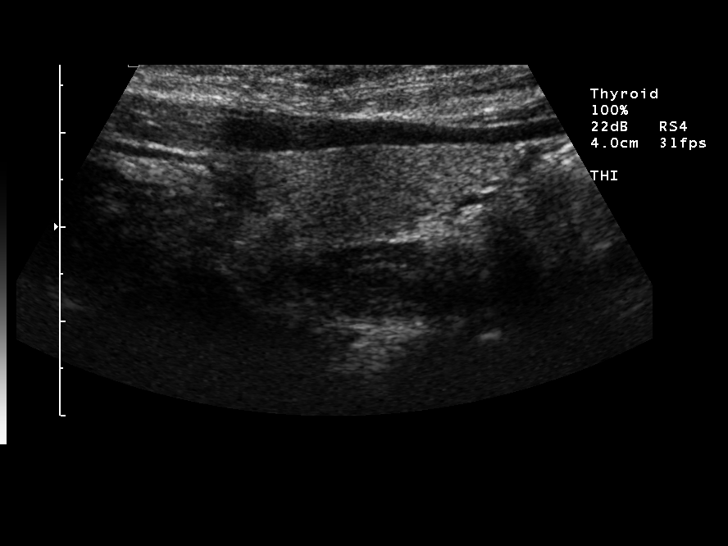
[im 6/18]
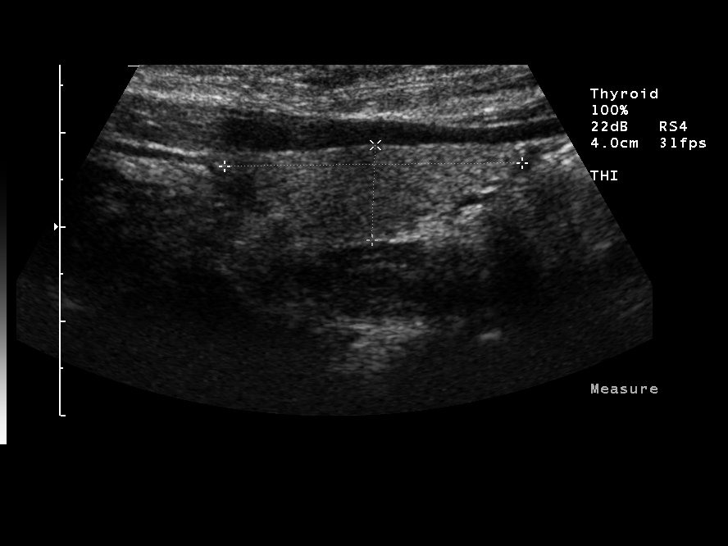
[im 8/18]
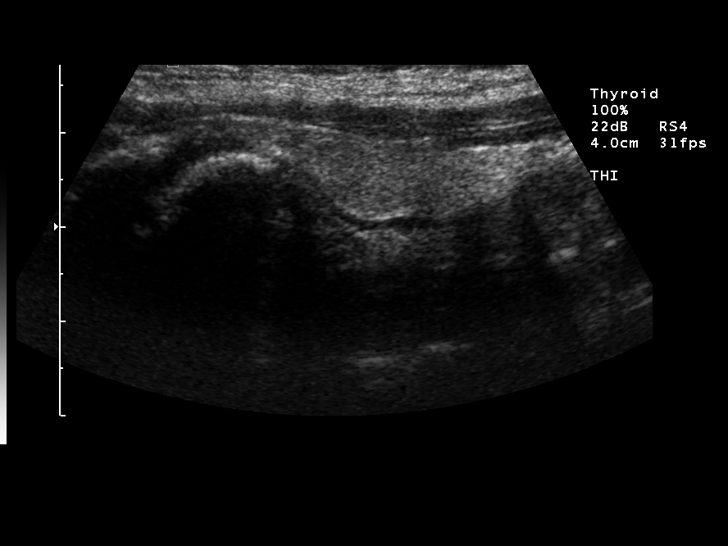
[im 9/18]
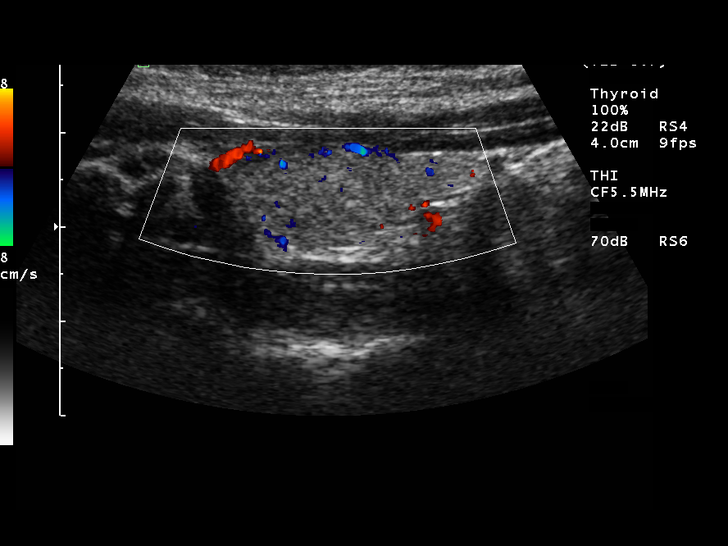
[im 10/18]
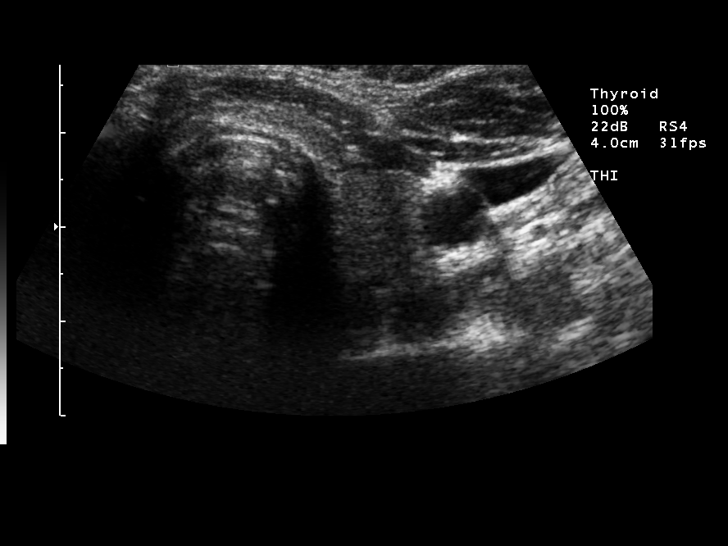
[im 11/18]
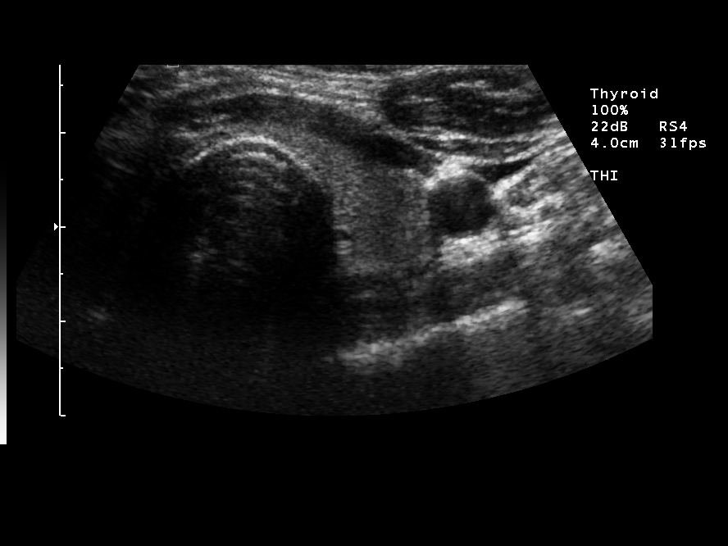
[im 13/18]
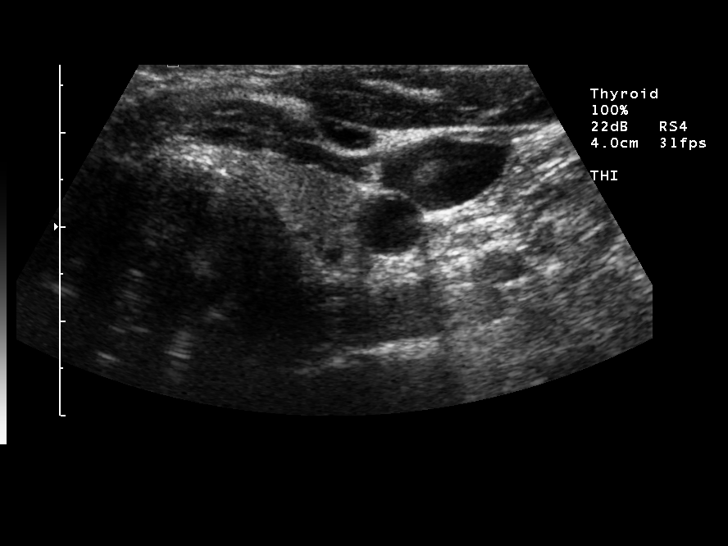
[im 14/18]
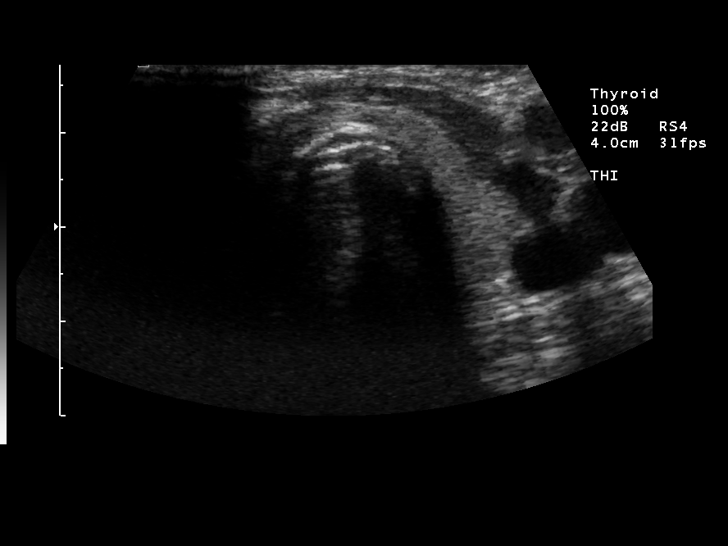
[im 15/18]
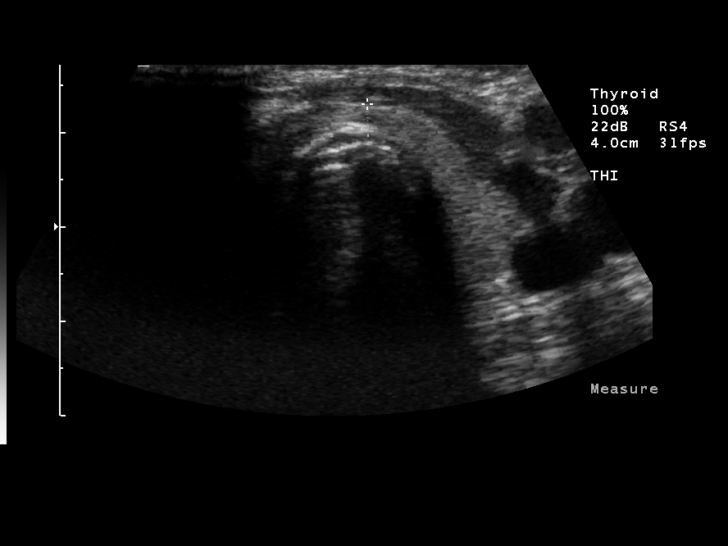
[im 17/18]
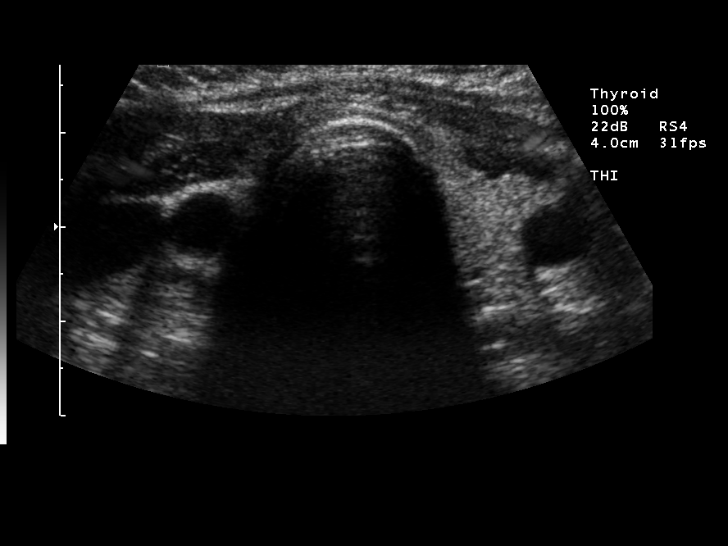
[im 18/18]
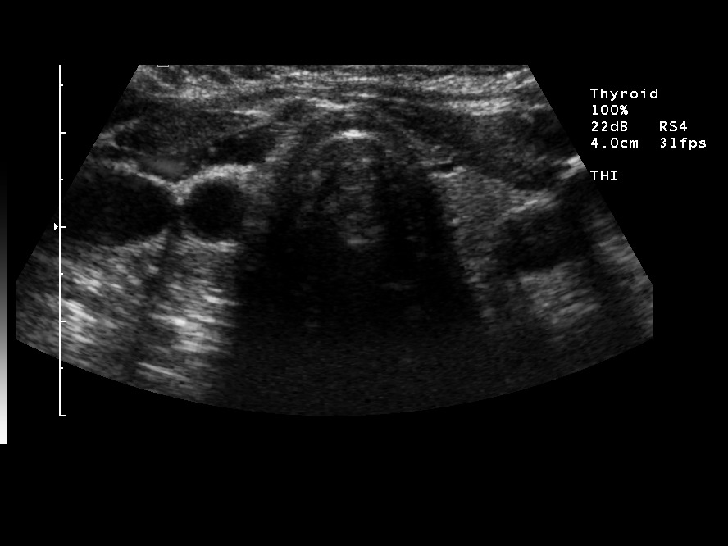

[14 of 18 positions shown; findings below may reference images not displayed]

FINDINGS: The right lobe is surgically absent.  The left lobe is
3.2 x 1.0 x 1.0 cm.  The isthmus measures 3 mm in thickness.
Nodule previously seen along the right aspect of the isthmus is no
longer apparent.  No nodule is identified on the left.
IMPRESSION: Status post resection of the right lobe of the thyroid and right
aspect of the isthmus.  There has been resection of the nodule
noted on the prior studies.  No evidence for nodule on the left.

## 2010-03-09 ENCOUNTER — Encounter: Payer: Self-pay | Admitting: Endocrinology

## 2010-06-30 NOTE — Assessment & Plan Note (Signed)
HEALTHCARE                            CARDIOLOGY OFFICE NOTE   NAME:SPECKMANAshantee, [name]                     MRN:          [id number]  DATE:01/25/2007                            DOB:          09/24/1955    I was asked by Dr. Richardean Chimera to consult on [name] with a  family history of coronary artery disease.   HISTORY OF PRESENT ILLNESS:  [name] is a delightful 55 year old  married white female, third-grade teacher at [employer], who has a  family history of coronary disease.  Her mother had an angioplasty at  age 78 in 6.  She subsequently had a stent placed in that artery in  07/20/1999.  She fell and died of a stroke in 07/20/06.   She did have a history of high cholesterol.  She did not have any other  major conventional risk factors as far as the patient can remember.   [name], on the other hand, is extremely health conscious.  She  walks 3 hours or more a week.  She says her cholesterol is usually  around a little less than 200 but cannot remember the fractionation.  We  will retrieve those labs from Dr. Lisbeth Ply office.   She does not smoke, does not have hypertension, no history of diabetes  and is not overweight.   She is asymptomatic at present.   PAST MEDICAL HISTORY:  She has had a DYE reaction in the past.  She is  not allergic to any other medications.  Her current medications are  birth control pills, multivitamin and aspirin 81 mg q.h.s.   She drinks three glasses of wine per week.  She does not smoke.  She  does not use any recreational products.  As I mentioned above, she likes  to walk.   REVIEW OF SYSTEMS:  Negative other than the HPI.  She did have a nodule  detected on her thyroid recently.  She is having an ultrasound on  February 13, 2007.   EXAMINATION:  She is a very pleasant lady in no acute distress.  Her  blood pressure is 138/90, her pulse is 78 and regular, weight is 142.  She is 5 feet 7  inches.  HEENT:  Normocephalic, atraumatic.  PERRLA.  Extraocular movements  intact.  Sclerae clear, facial symmetry is normal.  Carotid upstrokes  are equal bilaterally without bruits.  No JVD.  Thyroid is not enlarged,  trachea is midline.  LUNGS:  Clear.  HEART:  Reveals a regular rate and rhythm, nondisplaced PMI.  ABDOMEN:  Soft, good bowel sounds, no midline bruit.  EXTREMITIES:  Reveal no cyanosis, clubbing or edema.  Pulses are intact.  NEUROLOGIC:  Intact.   EKG is normal.   ASSESSMENT:  I spent a good 20 minutes talking to [name] about  the perspective to put on her family history of coronary disease in her  mother.  This is only one risk factor she has, particularly if her  lipids are as good as she says they are.  I will get a copy of those  from Dr. Lisbeth Ply office.  If that is the case, her Framingham risk  analysis is less than 1% over the next 10 years of having a coronary  event.  It will probably only go up a few percent 10 years from now if  things stay the same.   I encouraged her to continue her therapeutic lifestyle and to continue  walking particularly.  I will see her back on a p.r.n. basis.     Thomas C. Daleen Squibb, MD, [facility]  Electronically Signed    TCW/MedQ  DD: 01/25/2007  DT: 01/26/2007  Job #: [id number]   cc:   Juluis Mire, M.D.

## 2010-06-30 NOTE — Op Note (Signed)
NAMEANALIAH, DRUM              ACCOUNT NO.:  [id number]   MEDICAL RECORD NO.:  [id number]          PATIENT TYPE:  AMB   LOCATION:  ENDO                         FACILITY:  [facility]   PHYSICIAN:  Georgiana Spinner, M.D.    DATE OF BIRTH:  [birth year]   DATE OF PROCEDURE:  09/13/2007  DATE OF DISCHARGE:                               OPERATIVE REPORT   PROCEDURE:  Colonoscopy.   INDICATIONS:  Colon cancer screening.   ANESTHESIA:  Fentanyl 100 mcg, Versed 10 mg.   PROCEDURE:  With the patient mildly sedated, in the left lateral  decubitus position, the Pentax videoscopic colonoscope was inserted in  the rectum and passed under direct vision to the cecum, identified by  ileocecal valve and base of cecum, both of which were photographed.  From this point, the colonoscope was slowly withdrawn taking  circumferential views of the colonic mucosa, stopping only at  approximately 20 cm from the anal verge, at which point we saw a change  in the mucosa which I photographed and biopsied to rule out polypoid  tissue.  The endoscope was then withdrawn to the rectum which appeared  normal on direct and showed hemorrhoids on retroflexed view.  The  endoscope was straightened and withdrawn.  The patient's vital signs and  pulse oximeter remained stable.  The patient tolerated procedure well,  without apparent complications.   FINDINGS:  Question of polyp at 20 cm, await biopsy report.  The patient  will call me for results and follow up with me as an outpatient.  Internal hemorrhoids also noted.           ______________________________  Georgiana Spinner, M.D.     GMO/MEDQ  D:  09/13/2007  T:  09/13/2007  Job:  [id number]

## 2010-06-30 NOTE — Op Note (Signed)
[name], [name]              ACCOUNT NO.:  [id number]   MEDICAL RECORD NO.:  [id number]          PATIENT TYPE:  AMB   LOCATION:  DAY                          [facility]AC[facility]  [facility]   PHYSICIAN:  Velora Heckler, MD      DATE OF BIRTH:  1955-07-22   DATE OF PROCEDURE:  06/14/2007  DATE OF DISCHARGE:                               OPERATIVE REPORT   PREOPERATIVE DIAGNOSIS:  Right thyroid nodule with atypia.   POSTOPERATIVE DIAGNOSIS:  Right thyroid nodule with atypia.   PROCEDURE:  Right thyroid lobectomy.   SURGEON:  Velora Heckler, MD, FACS   ANESTHESIA:  General.   ESTIMATED BLOOD LOSS:  Minimal.   PREPARATION:  Betadine.   COMPLICATIONS:  None.   INDICATIONS:  The patient is a 55 year old white female referred by Dr.  Richardean Chimera for newly diagnosed thyroid nodule.  This was found on  routine physical examination.  The patient underwent ultrasound in  December 2008.  This showed a 9 mm nodule in the right thyroid lobe at  the junction with the isthmus.  Ultrasound guided fine needle aspiration  biopsy was obtained.  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  The patient is now  brought to the operating room for resection for definitive diagnosis.   DESCRIPTION OF PROCEDURE:  The procedure is done in OR #11 at the [facility].  The patient is brought to the operating room  and placed in the supine position on the operating room table.  Following administration of general anesthesia the patient is positioned  and then prepped and draped in the usual strict aseptic fashion.  After  ascertaining that an adequate level of anesthesia had been achieved, a  Kocher incision was made with a #15 blade.  Dissection was carried down  through subcutaneous tissues and platysma.  Hemostasis was obtained with  electrocautery.  Skin flaps were elevated cephalad and caudad from the  thyroid notch to the sternal notch.   A Mahorner self-retaining retractor  was placed for exposure.  Strap muscles were incised in the midline.  Left thyroid lobe was exposed.  It appears small and relatively normal.  No gross abnormality is identified.  No palpable nodules were  identified.   On the right side there is a palpable nodule in the anterior right lobe.  This measures less than a centimeter in size.  Remainder of the lobe was  essentially normal.  Strap muscles were reflected laterally.  Middle  thyroid vein was divided between small Ligaclips with the harmonic  scalpel.  Superior pole was dissected out.  Superior pole vessels were  ligated in continuity with 2-0 silk ties and divided with the harmonic  scalpel.  Gland is rolled anteriorly.  Inferior venous tributaries were  divided between medium Ligaclips with the harmonic scalpel.  There was a  prominent tubercle which is gently dissected out.  Recurrent nerve was  identified and preserved.  Branches of the inferior thyroid artery are  divided between small Ligaclips with the harmonic scalpel.  Ligament of  Allyson Sabal is transected with electrocautery and the lobe is rolled up and  onto the anterior surface of the trachea.  Next a small pyramidal lobe  was resected with the specimen.  Isthmus is mobilized across the  midline.  Thyroid parenchyma is divided with the harmonic scalpel at the  junction of the isthmus and left thyroid lobe.  Specimen was marked with  a suture at the right superior pole and submitted to pathology for  permanent review only.   The neck is irrigated with warm saline.  Good hemostasis was noted.  Surgicel was placed in the operative field.  Strap muscles were  reapproximated in the midline with interrupted 3-0 Vicryl sutures.  Platysma was closed with interrupted 3-0 Vicryl sutures.  Skin was  closed with running 4-0 Monocryl subcuticular suture.  Wound is washed  and dried and Benzoin and Steri-Strips were applied.  Sterile dressings   were applied.  The patient is awakened from anesthesia and brought to  the recovery room in stable condition.  The patient tolerated the  procedure well.      Velora Heckler, MD  Electronically Signed     TMG/MEDQ  D:  06/14/2007  T:  06/14/2007  Job:  [id number]   cc:   Soyla Murphy. Renne Crigler, M.D.  Fax: [phone]   Juluis Mire, M.D.  Fax: [phone]   Velora Heckler, MD  [phone] [place]  [place] [address]

## 2010-07-03 NOTE — Assessment & Plan Note (Signed)
[facility]                            CARDIOLOGY OFFICE NOTE   NAME:[name], [name]                     MRN:          [id number]  DATE:01/26/2007                            DOB:          [birth year]    I have received the lab work from Dr. Lisbeth Ply office.  Her total  cholesterol was 223, triglycerides 110, HDL 62, LDL 139.  Her total  cholesterol/HDL ratio is 3.6 which is quite good.  Her fasting blood  sugar was 84 which is normal.  Her thyroid profile was normal.   At this time, I would not recommend pharmacological therapy.  Please see  my complete clinic note from yesterday.  I would recommend a fasting  lipid panel each year through Dr. Arelia Sneddon.     Thomas C. Daleen Squibb, MD, [facility]  Electronically Signed    TCW/MedQ  DD: 01/26/2007  DT: 01/27/2007  Job #: [id number]   cc:   Juluis Mire, M.D.

## 2010-09-14 ENCOUNTER — Other Ambulatory Visit: Payer: Self-pay | Admitting: Internal Medicine

## 2010-09-14 DIAGNOSIS — C73 Malignant neoplasm of thyroid gland: Secondary | ICD-10-CM

## 2010-09-21 ENCOUNTER — Ambulatory Visit
Admission: RE | Admit: 2010-09-21 | Discharge: 2010-09-21 | Disposition: A | Discharge status: Home / Self Care | Payer: BC Managed Care – PPO | Source: Ambulatory Visit | Attending: Internal Medicine | Admitting: Internal Medicine

## 2010-09-21 DIAGNOSIS — C73 Malignant neoplasm of thyroid gland: Secondary | ICD-10-CM

## 2010-09-21 IMAGING — US US SOFT TISSUE HEAD/NECK
1 series · 14 of 21 positions shown · non-contrast
Comparison: [DATE]

CLINICAL DATA: History of thyroid cancer.  Prior hemithyroidectomy.

THYROID ULTRASOUND
TECHNIQUE: Ultrasound examination of the thyroid gland and
adjacent soft tissues was performed.

[Series 1: us soft tissue head/neck · 0.07mm/px · 14 of 21 slices shown]
[im 1/21]
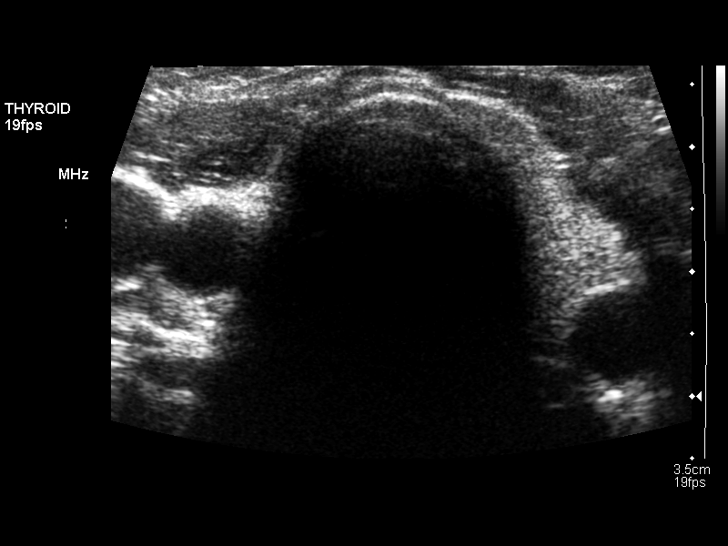
[im 3/21]
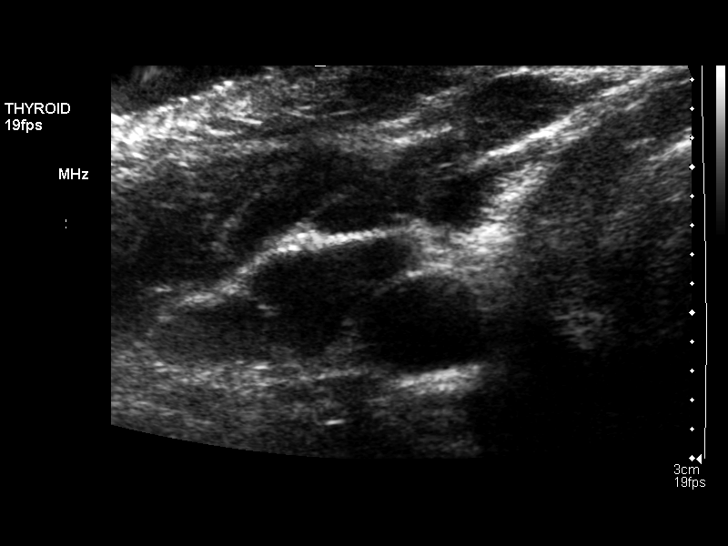
[im 4/21]
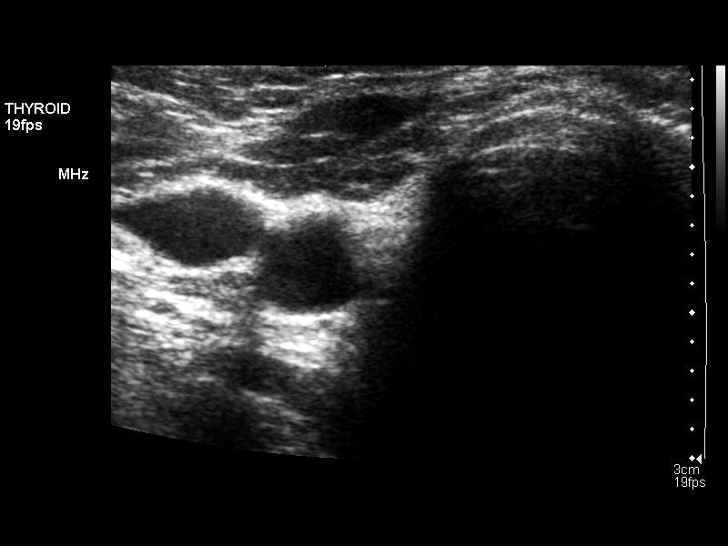
[im 6/21]
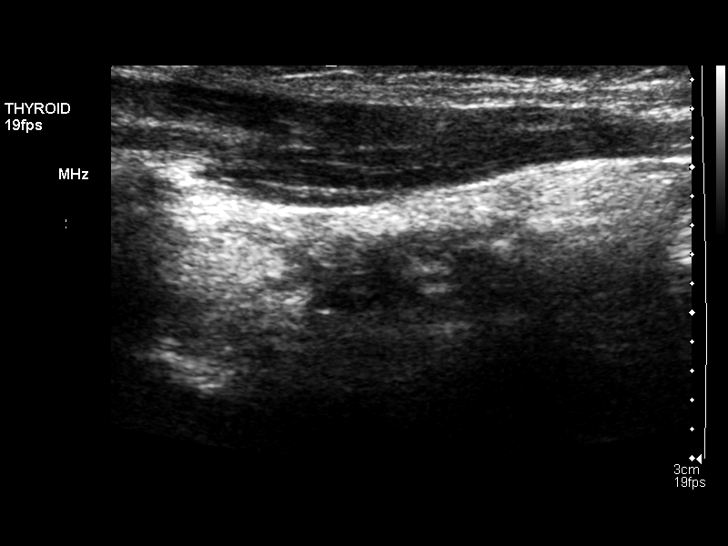
[im 7/21]
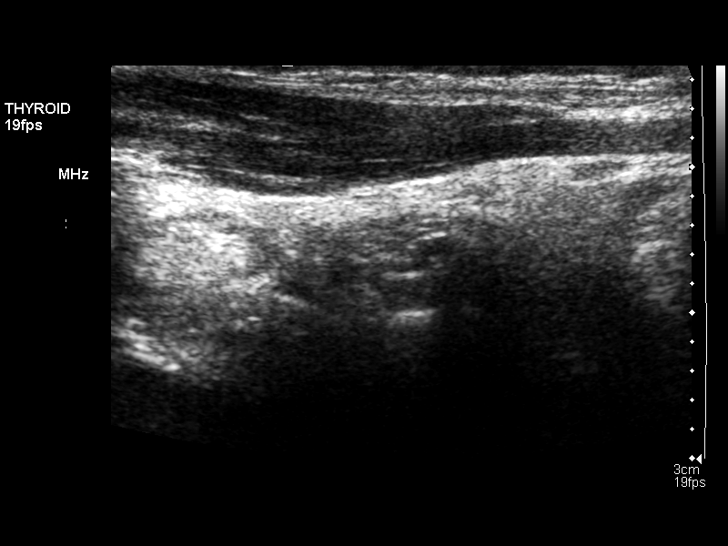
[im 9/21]
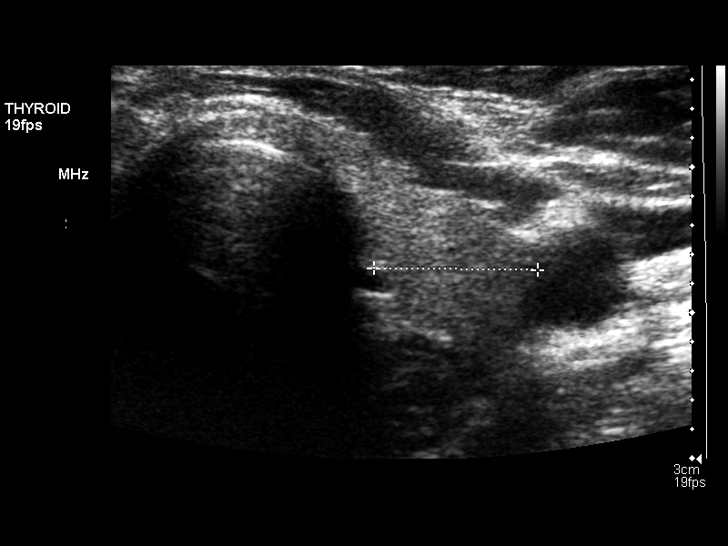
[im 10/21]
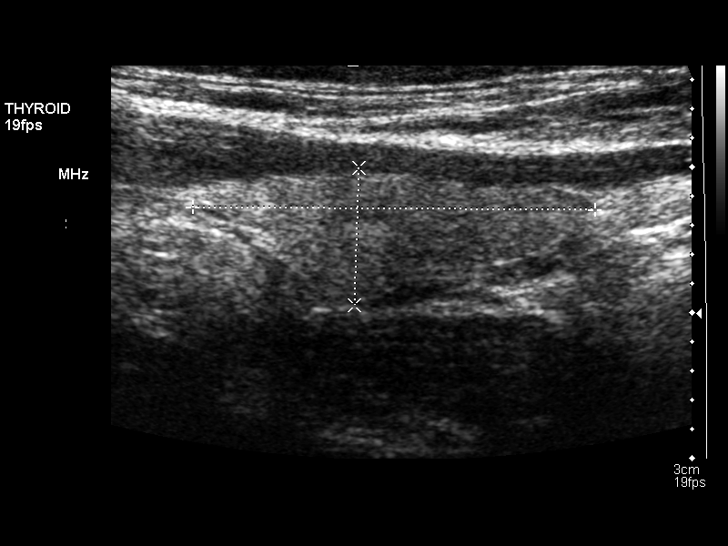
[im 12/21]
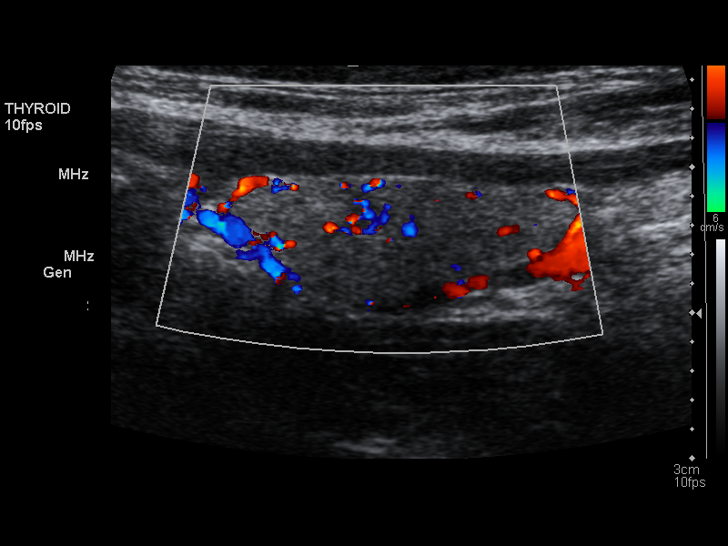
[im 13/21]
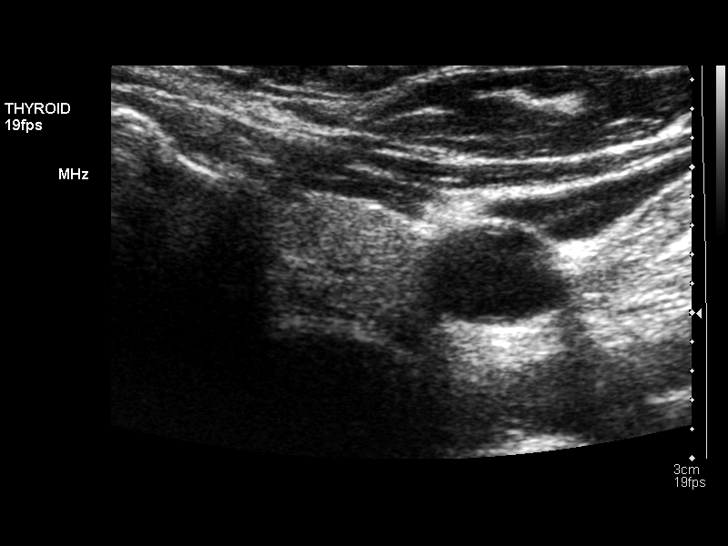
[im 15/21]
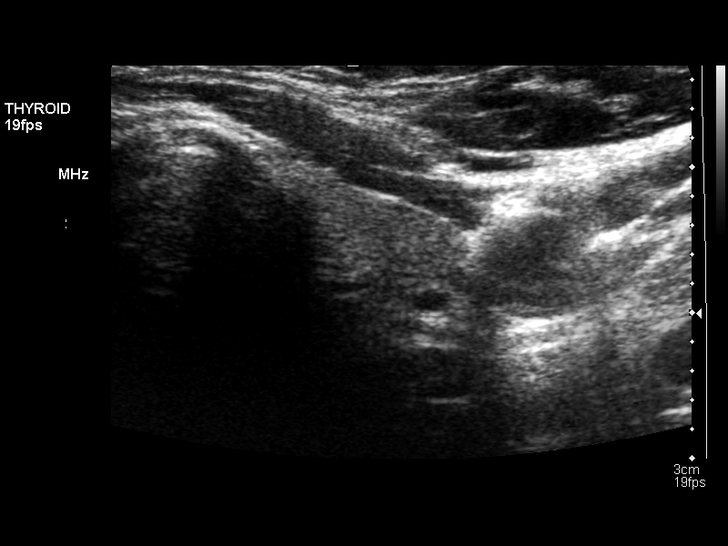
[im 16/21]
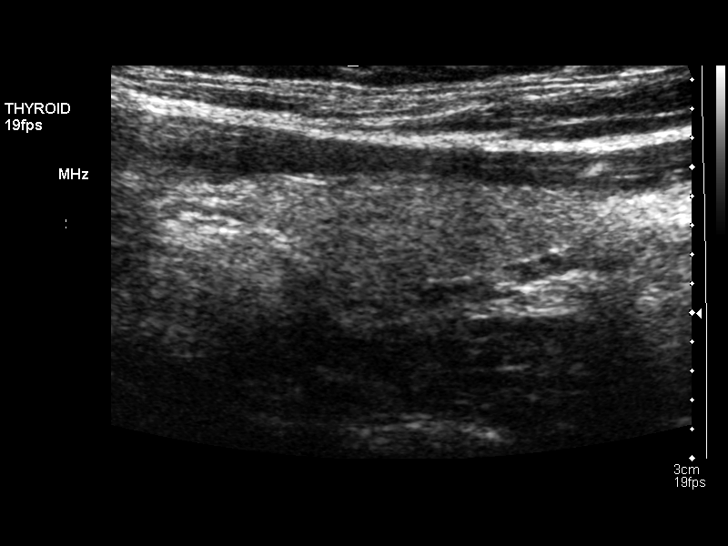
[im 18/21]
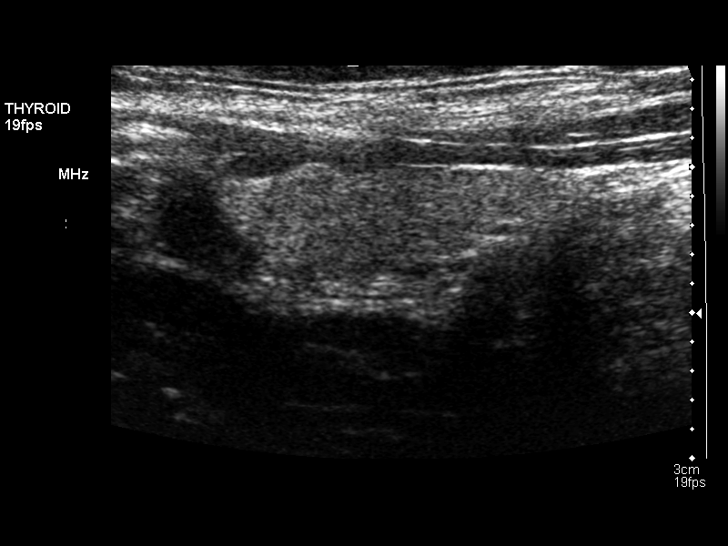
[im 19/21]
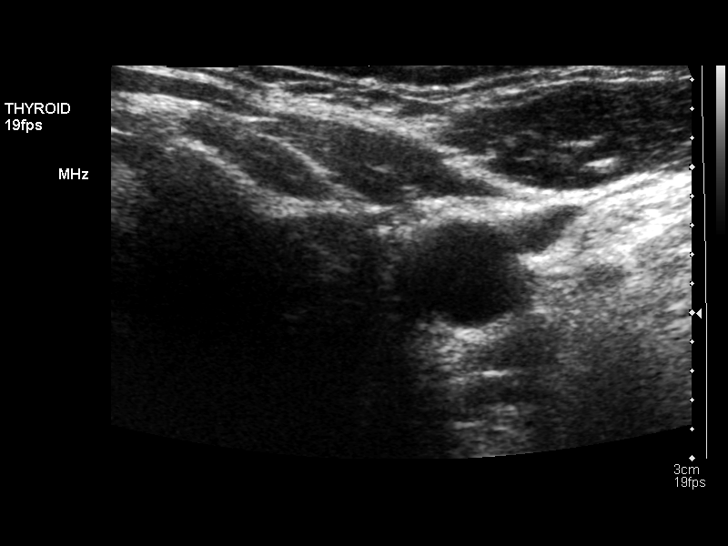
[im 21/21]
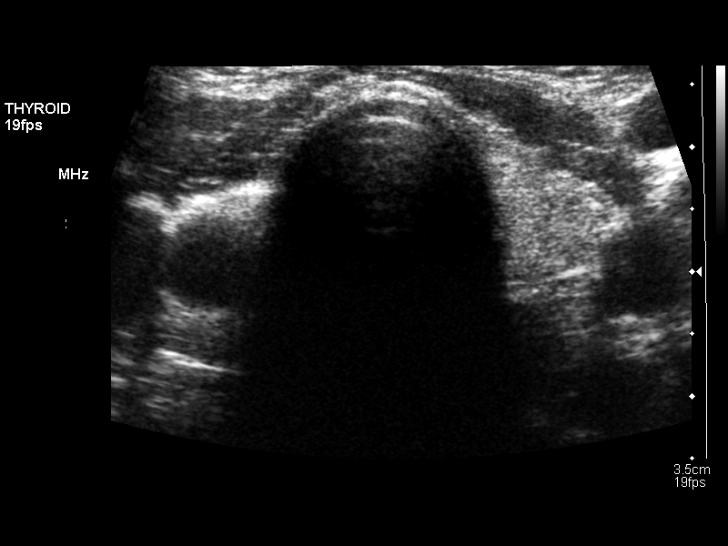

[14 of 21 positions shown; findings below may reference images not displayed]

FINDINGS: The right thyroid lobe is surgically absent.  The left lobe
measures 2.8 x 0.9 x 1.1 cm.  The isthmus is not well seen.
Thyroid parenchyma in the left gland is homogeneous.  No evidence
for left thyroid nodule.
IMPRESSION: Status post right hemithyroidectomy.

No evidence for left thyroid nodule.

## 2010-11-10 LAB — DIFFERENTIAL
Basophils Relative: 1
Eosinophils Absolute: 0.2
Eosinophils Relative: 3
Lymphocytes Relative: 22
Monocytes Relative: 5

## 2010-11-10 LAB — URINALYSIS, ROUTINE W REFLEX MICROSCOPIC
Specific Gravity, Urine: 1.006
Urobilinogen, UA: 0.2

## 2010-11-10 LAB — BASIC METABOLIC PANEL
BUN: 10
Calcium: 8.9
GFR calc Af Amer: >60
GFR calc non Af Amer: >60
Glucose, Bld: 111 — ABNORMAL HIGH

## 2010-11-10 LAB — CBC
Hemoglobin: 13.4
MCHC: 35.2
MCV: 90.4
RBC: 4.21

## 2010-11-10 LAB — PREGNANCY, URINE: Preg Test, Ur: NEGATIVE

## 2010-11-10 LAB — URINE MICROSCOPIC-ADD ON

## 2014-11-28 ENCOUNTER — Other Ambulatory Visit: Payer: Self-pay | Admitting: Internal Medicine

## 2014-11-28 DIAGNOSIS — Z85850 Personal history of malignant neoplasm of thyroid: Secondary | ICD-10-CM

## 2014-12-06 ENCOUNTER — Other Ambulatory Visit: Payer: Self-pay

## 2014-12-13 ENCOUNTER — Other Ambulatory Visit: Payer: Self-pay

## 2014-12-20 ENCOUNTER — Ambulatory Visit
Admission: RE | Admit: 2014-12-20 | Discharge: 2014-12-20 | Disposition: A | Discharge status: Home / Self Care | Payer: 59 | Source: Ambulatory Visit | Attending: Internal Medicine | Admitting: Internal Medicine

## 2014-12-20 DIAGNOSIS — Z85850 Personal history of malignant neoplasm of thyroid: Secondary | ICD-10-CM

## 2014-12-20 IMAGING — US US SOFT TISSUE HEAD/NECK
1 series · 14 of 25 positions shown · non-contrast
Comparison: Thyroid ultrasound - [DATE]; [DATE]

CLINICAL DATA: History of thyroid cancer, post right thyroidectomy.

EXAM:
THYROID ULTRASOUND
TECHNIQUE: Ultrasound examination of the thyroid gland and adjacent soft
tissues was performed.

[Series 1: us soft tissue head/neck · 0.06mm/px · 14 of 28 slices shown]
[im 1/28]
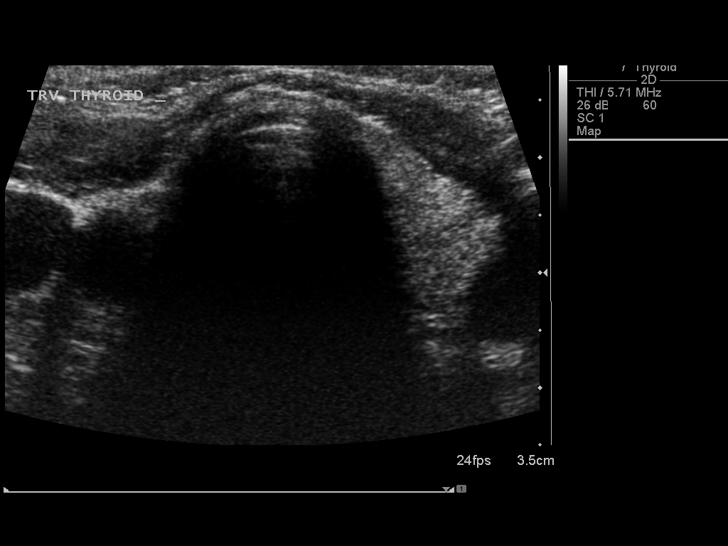
[im 3/28]
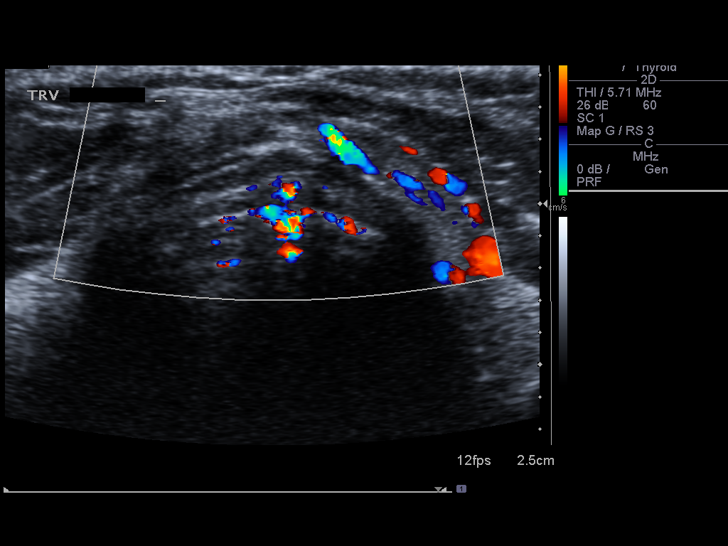
[im 5/28]
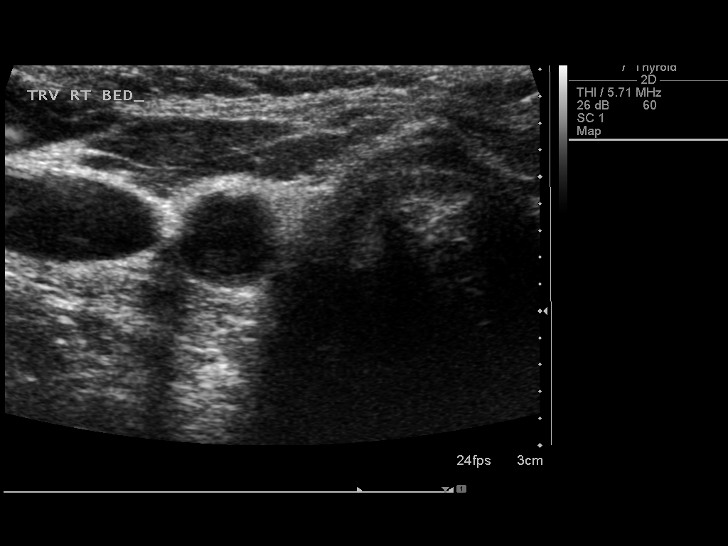
[im 7/28]
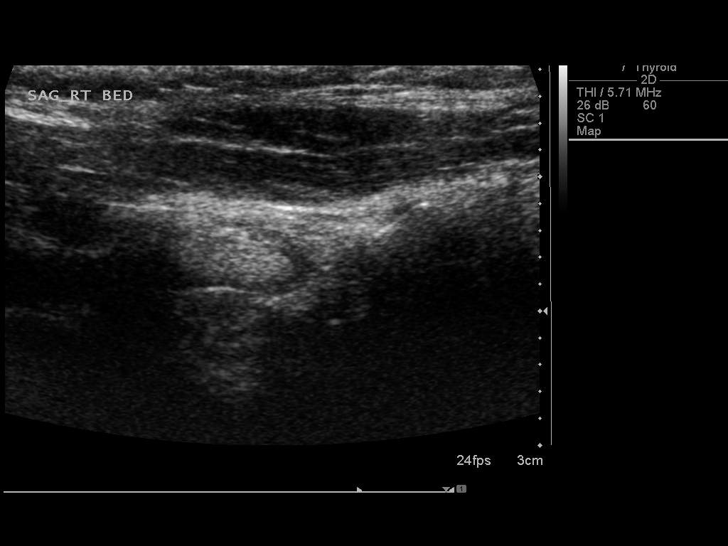
[im 10/28]
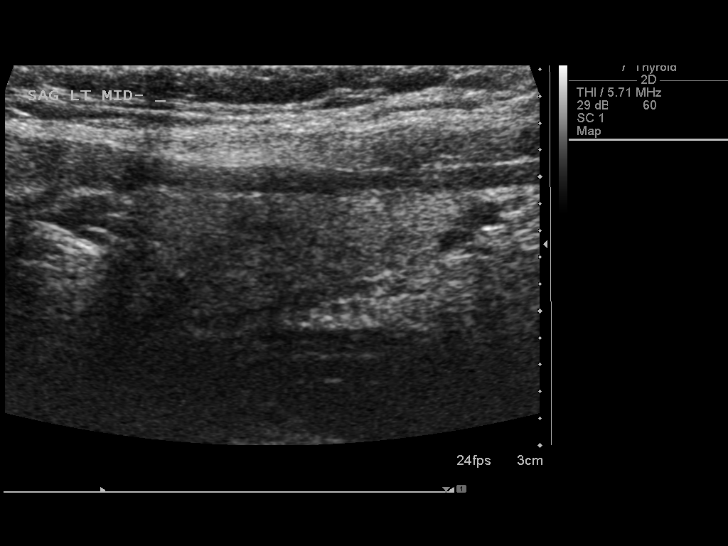
[im 11/28]
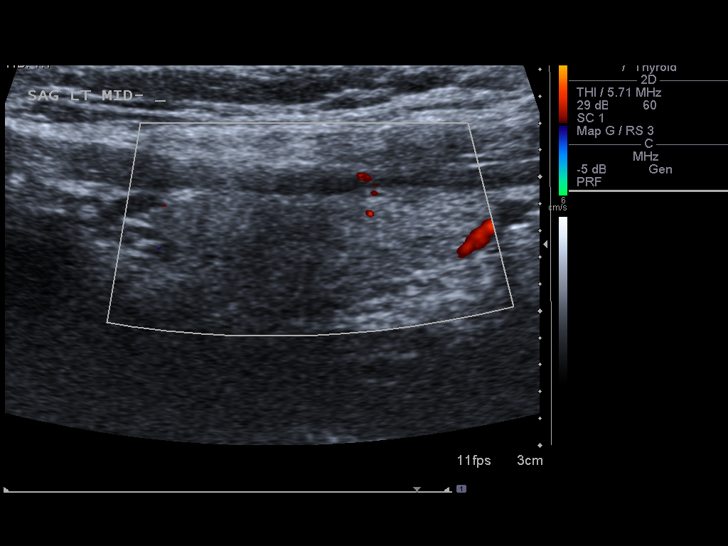
[im 13/28]
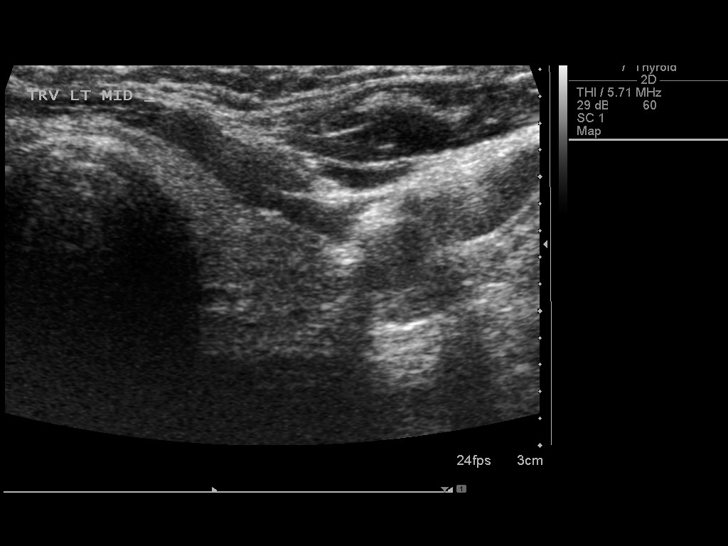
[im 15/28]
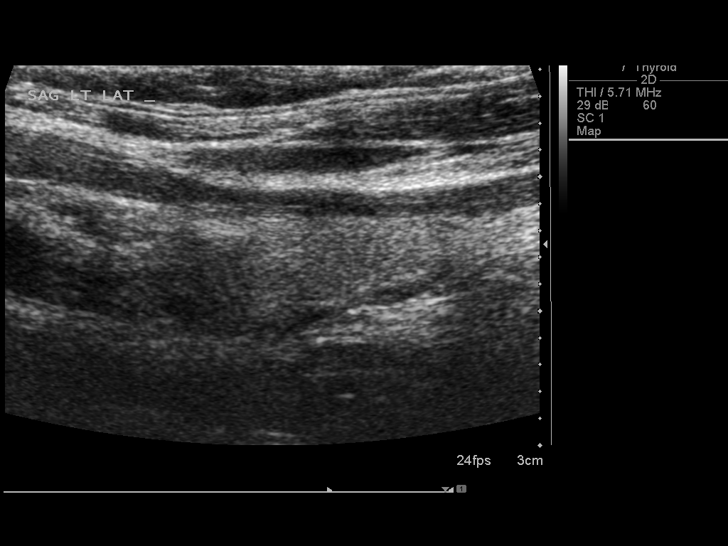
[im 17/28]
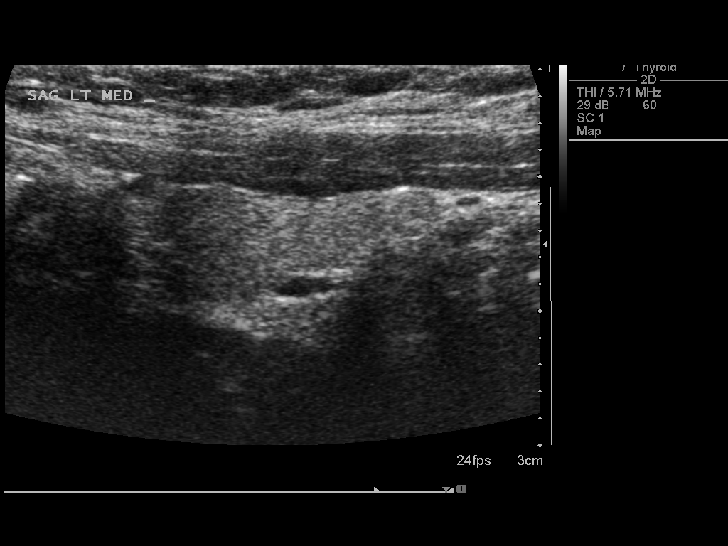
[im 19/28]
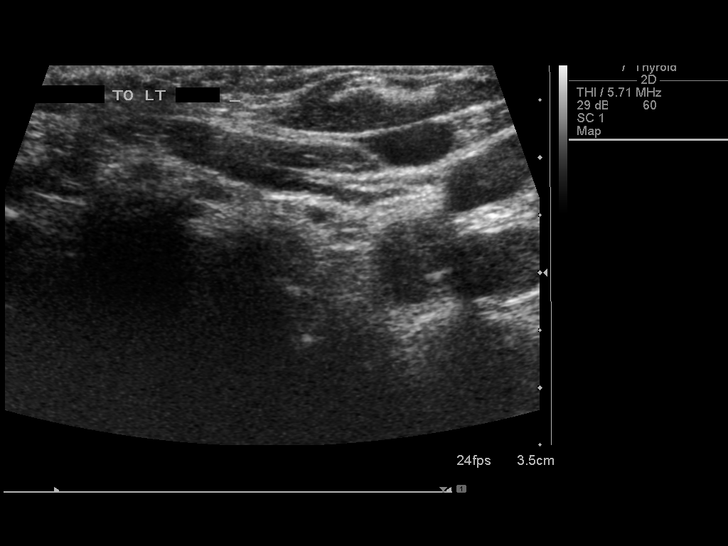
[im 21/28]
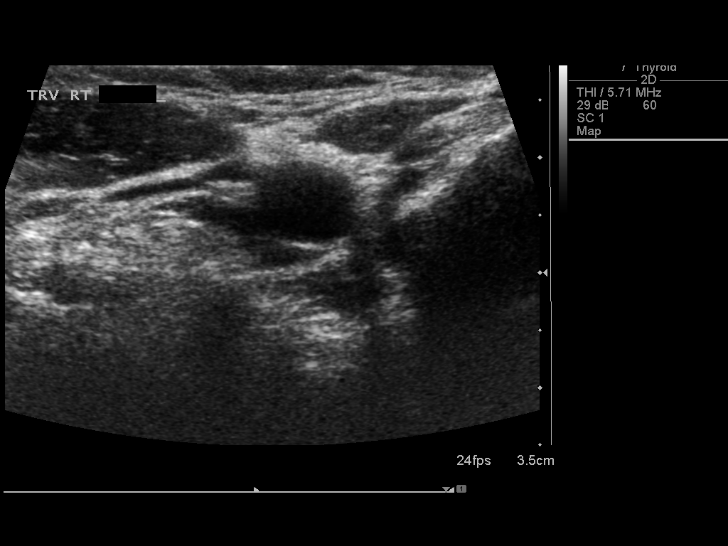
[im 23/28]
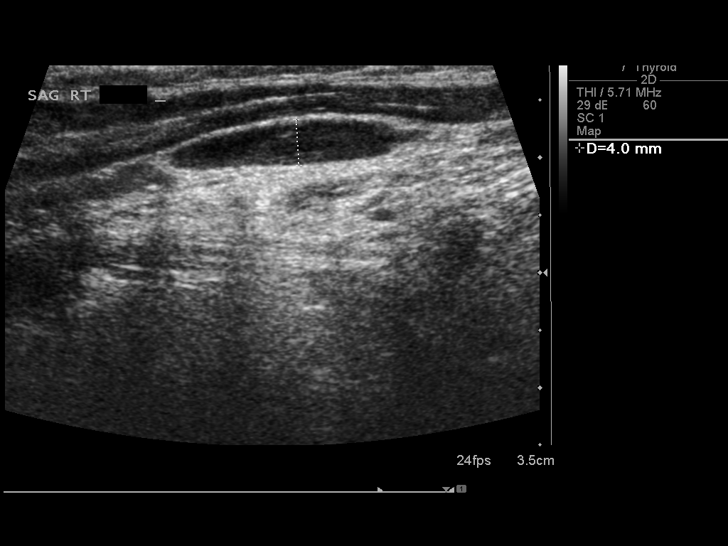
[im 25/28]
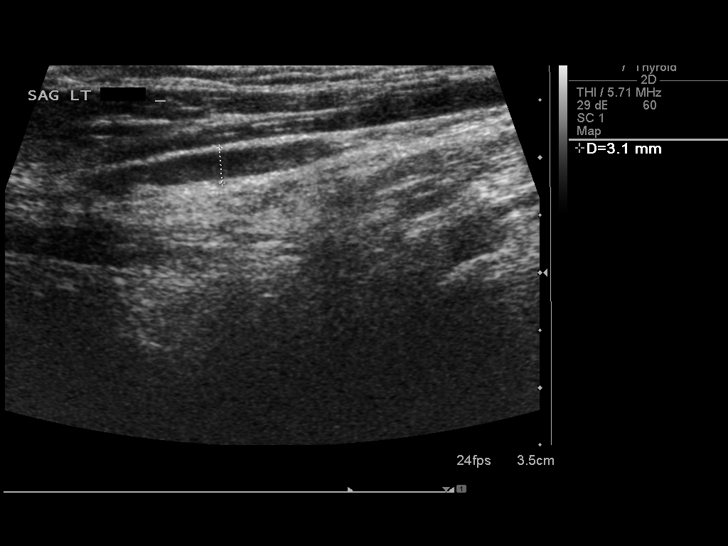
[im 28/28]
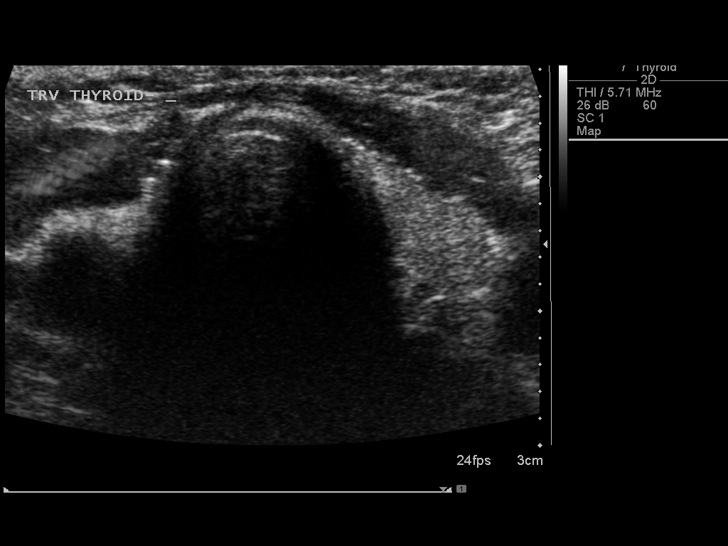

[14 of 25 positions shown; findings below may reference images not displayed]

FINDINGS: Right thyroid lobe

Surgically absent. There is no residual nodular soft tissue within
the right thyroidectomy bed.

Left thyroid lobe

Measurements: Slightly diminutive in size measuring 2.6 x 0.9 x
cm.

No discrete nodule or mass is identified within the left lobe of the
thyroid.

Isthmus

Thickness: Normal in size measures 0.2 cm in diameter.

No discrete nodule or mass identified within the thyroid isthmus.

Lymphadenopathy

None visualized.
IMPRESSION: Post right thyroid lobectomy without evidence of locally recurrent
or metastatic disease.

## 2017-02-14 DIAGNOSIS — E7800 Pure hypercholesterolemia, unspecified: Secondary | ICD-10-CM | POA: Diagnosis not present

## 2017-02-14 DIAGNOSIS — E039 Hypothyroidism, unspecified: Secondary | ICD-10-CM | POA: Diagnosis not present

## 2017-02-14 DIAGNOSIS — Z0000 Encounter for general adult medical examination without abnormal findings: Secondary | ICD-10-CM | POA: Diagnosis not present

## 2017-02-18 DIAGNOSIS — R3129 Other microscopic hematuria: Secondary | ICD-10-CM | POA: Diagnosis not present

## 2017-02-18 DIAGNOSIS — Z0000 Encounter for general adult medical examination without abnormal findings: Secondary | ICD-10-CM | POA: Diagnosis not present

## 2017-02-18 DIAGNOSIS — Z85850 Personal history of malignant neoplasm of thyroid: Secondary | ICD-10-CM | POA: Diagnosis not present

## 2017-02-25 DIAGNOSIS — H5213 Myopia, bilateral: Secondary | ICD-10-CM | POA: Diagnosis not present

## 2017-04-11 DIAGNOSIS — E039 Hypothyroidism, unspecified: Secondary | ICD-10-CM | POA: Diagnosis not present

## 2017-07-12 DIAGNOSIS — Z1231 Encounter for screening mammogram for malignant neoplasm of breast: Secondary | ICD-10-CM | POA: Diagnosis not present

## 2017-07-12 DIAGNOSIS — Z13820 Encounter for screening for osteoporosis: Secondary | ICD-10-CM | POA: Diagnosis not present

## 2017-07-12 DIAGNOSIS — Z6823 Body mass index (BMI) 23.0-23.9, adult: Secondary | ICD-10-CM | POA: Diagnosis not present

## 2017-07-12 DIAGNOSIS — Z01419 Encounter for gynecological examination (general) (routine) without abnormal findings: Secondary | ICD-10-CM | POA: Diagnosis not present

## 2017-08-29 DIAGNOSIS — M4692 Unspecified inflammatory spondylopathy, cervical region: Secondary | ICD-10-CM | POA: Diagnosis not present

## 2017-08-29 DIAGNOSIS — M67912 Unspecified disorder of synovium and tendon, left shoulder: Secondary | ICD-10-CM | POA: Diagnosis not present

## 2017-08-29 DIAGNOSIS — M25512 Pain in left shoulder: Secondary | ICD-10-CM | POA: Diagnosis not present

## 2017-09-05 DIAGNOSIS — S46012D Strain of muscle(s) and tendon(s) of the rotator cuff of left shoulder, subsequent encounter: Secondary | ICD-10-CM | POA: Diagnosis not present

## 2017-09-05 DIAGNOSIS — M25612 Stiffness of left shoulder, not elsewhere classified: Secondary | ICD-10-CM | POA: Diagnosis not present

## 2017-09-13 DIAGNOSIS — M25612 Stiffness of left shoulder, not elsewhere classified: Secondary | ICD-10-CM | POA: Diagnosis not present

## 2017-09-13 DIAGNOSIS — S46012D Strain of muscle(s) and tendon(s) of the rotator cuff of left shoulder, subsequent encounter: Secondary | ICD-10-CM | POA: Diagnosis not present

## 2017-09-15 DIAGNOSIS — M25612 Stiffness of left shoulder, not elsewhere classified: Secondary | ICD-10-CM | POA: Diagnosis not present

## 2017-09-15 DIAGNOSIS — S46012D Strain of muscle(s) and tendon(s) of the rotator cuff of left shoulder, subsequent encounter: Secondary | ICD-10-CM | POA: Diagnosis not present

## 2017-09-19 DIAGNOSIS — M25612 Stiffness of left shoulder, not elsewhere classified: Secondary | ICD-10-CM | POA: Diagnosis not present

## 2017-09-19 DIAGNOSIS — S46012D Strain of muscle(s) and tendon(s) of the rotator cuff of left shoulder, subsequent encounter: Secondary | ICD-10-CM | POA: Diagnosis not present

## 2017-09-22 DIAGNOSIS — M25612 Stiffness of left shoulder, not elsewhere classified: Secondary | ICD-10-CM | POA: Diagnosis not present

## 2017-09-22 DIAGNOSIS — S46012D Strain of muscle(s) and tendon(s) of the rotator cuff of left shoulder, subsequent encounter: Secondary | ICD-10-CM | POA: Diagnosis not present

## 2017-09-30 DIAGNOSIS — S46012D Strain of muscle(s) and tendon(s) of the rotator cuff of left shoulder, subsequent encounter: Secondary | ICD-10-CM | POA: Diagnosis not present

## 2017-09-30 DIAGNOSIS — M25612 Stiffness of left shoulder, not elsewhere classified: Secondary | ICD-10-CM | POA: Diagnosis not present

## 2017-10-04 DIAGNOSIS — M25612 Stiffness of left shoulder, not elsewhere classified: Secondary | ICD-10-CM | POA: Diagnosis not present

## 2017-10-04 DIAGNOSIS — S46012D Strain of muscle(s) and tendon(s) of the rotator cuff of left shoulder, subsequent encounter: Secondary | ICD-10-CM | POA: Diagnosis not present

## 2017-10-07 DIAGNOSIS — M25612 Stiffness of left shoulder, not elsewhere classified: Secondary | ICD-10-CM | POA: Diagnosis not present

## 2017-10-07 DIAGNOSIS — S46012D Strain of muscle(s) and tendon(s) of the rotator cuff of left shoulder, subsequent encounter: Secondary | ICD-10-CM | POA: Diagnosis not present

## 2017-10-11 DIAGNOSIS — M542 Cervicalgia: Secondary | ICD-10-CM | POA: Diagnosis not present

## 2017-10-11 DIAGNOSIS — M25612 Stiffness of left shoulder, not elsewhere classified: Secondary | ICD-10-CM | POA: Diagnosis not present

## 2017-11-04 DIAGNOSIS — Z23 Encounter for immunization: Secondary | ICD-10-CM | POA: Diagnosis not present

## 2019-07-18 ENCOUNTER — Other Ambulatory Visit: Payer: Self-pay | Admitting: Obstetrics and Gynecology

## 2019-07-18 DIAGNOSIS — N6310 Unspecified lump in the right breast, unspecified quadrant: Secondary | ICD-10-CM

## 2019-07-26 ENCOUNTER — Ambulatory Visit
Admission: RE | Admit: 2019-07-26 | Discharge: 2019-07-26 | Disposition: A | Discharge status: Home / Self Care | Payer: 59 | Source: Ambulatory Visit | Attending: Obstetrics and Gynecology | Admitting: Obstetrics and Gynecology

## 2019-07-26 ENCOUNTER — Other Ambulatory Visit: Payer: Self-pay | Admitting: Obstetrics and Gynecology

## 2019-07-26 ENCOUNTER — Other Ambulatory Visit: Payer: Self-pay

## 2019-07-26 DIAGNOSIS — N6310 Unspecified lump in the right breast, unspecified quadrant: Secondary | ICD-10-CM

## 2019-07-26 DIAGNOSIS — R2231 Localized swelling, mass and lump, right upper limb: Secondary | ICD-10-CM

## 2019-07-26 IMAGING — US US BREAST*R* LIMITED INC AXILLA
1 series · 12 of 25 positions shown · non-contrast
Comparison: Previous exam(s).

CLINICAL DATA: Pea-sized mass felt by the patient for the past
month in the lower outer right breast adjacent to a red skin lesion
that she thinks may be an insect bite. This initially had associated
blue coloration of the skin at the site of the presumed bite. She
had a [TE] vaccine in the left arm [DATE].

EXAM:
DIGITAL DIAGNOSTIC BILATERAL MAMMOGRAM WITH CAD AND TOMO
ULTRASOUND RIGHT BREAST

[Series 1: us breast*right* limited inc axilla · 0.06mm/px · 12 of 33 slices shown]
[im 2/33]
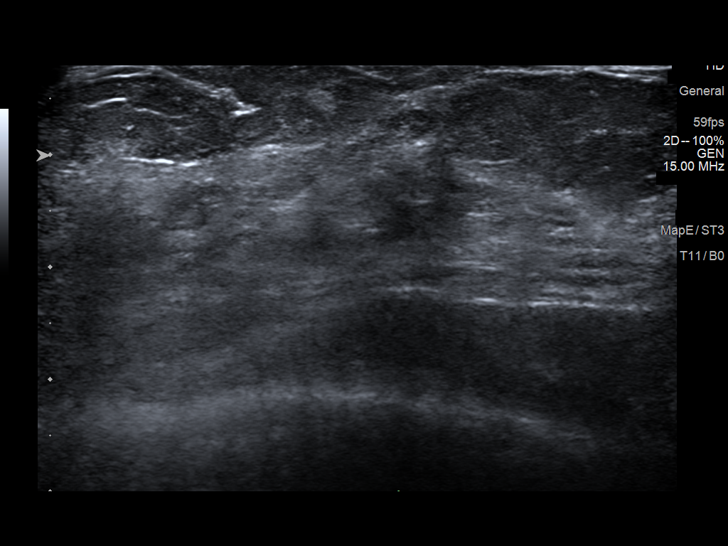
[im 5/33]
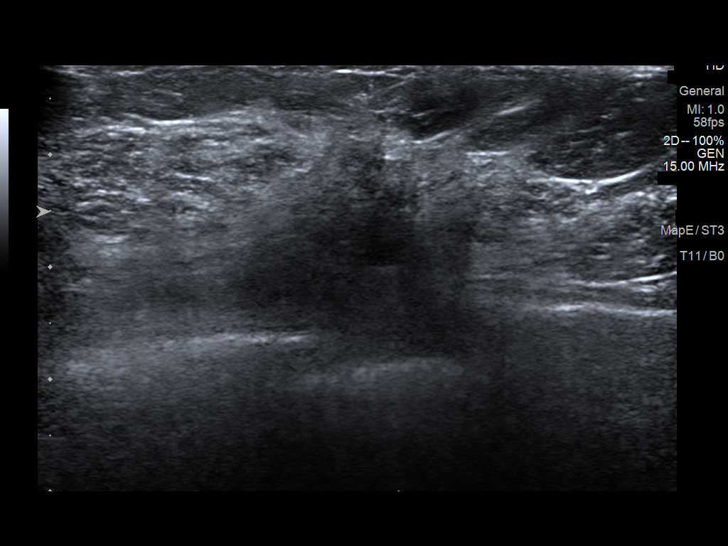
[im 7/33]
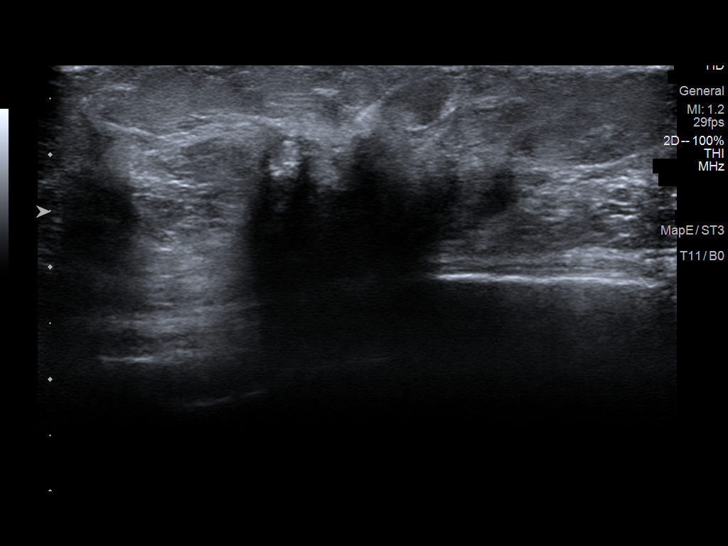
[im 10/33]
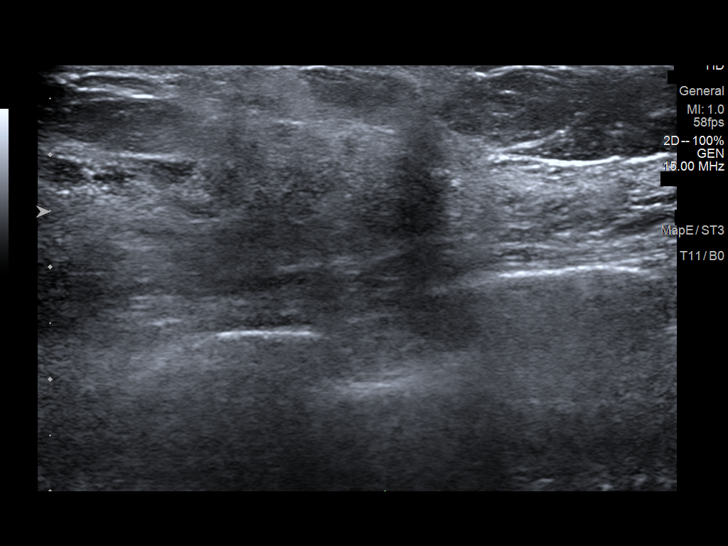
[im 13/33]
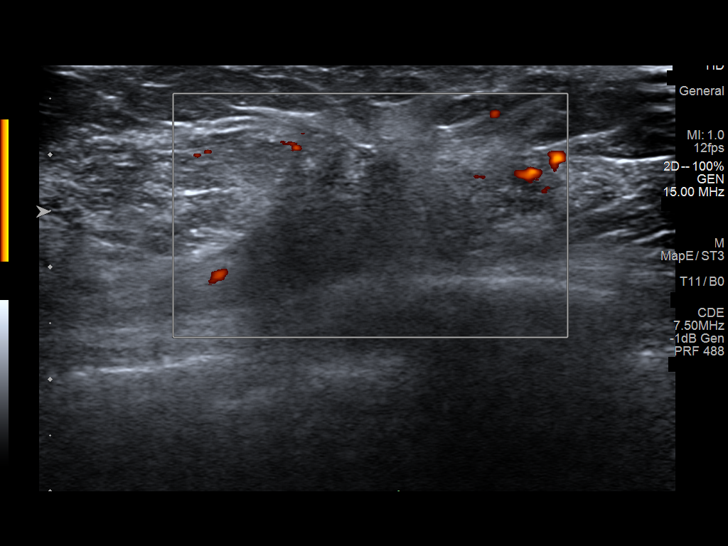
[im 15/33]
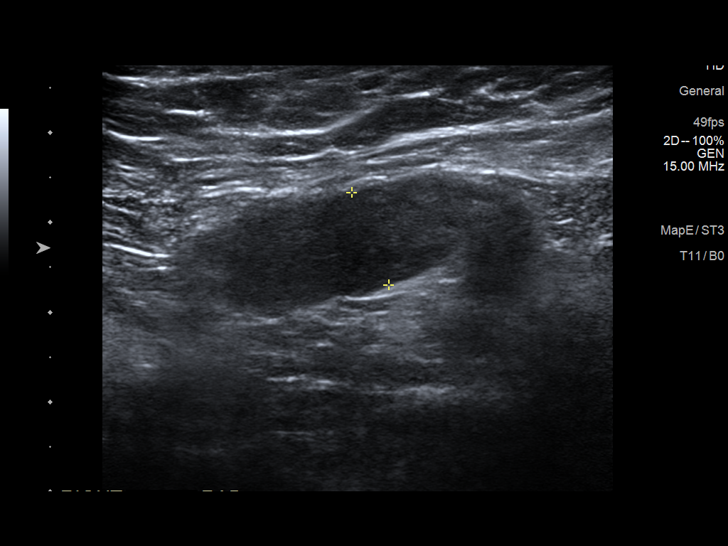
[im 18/33]
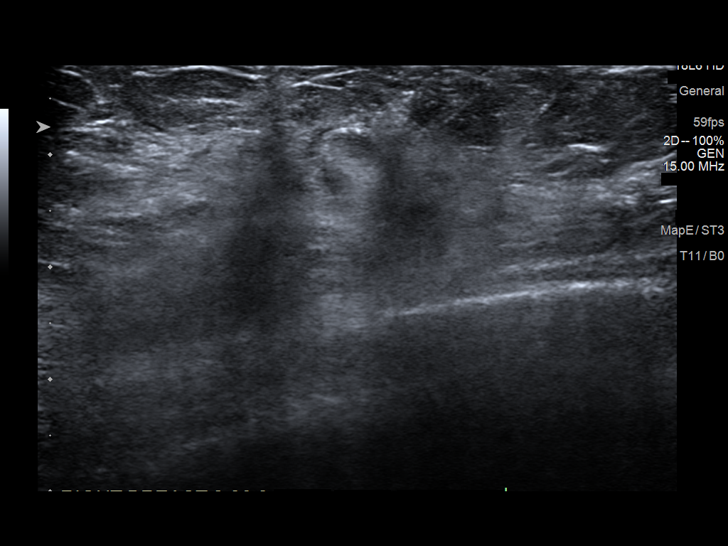
[im 21/33]
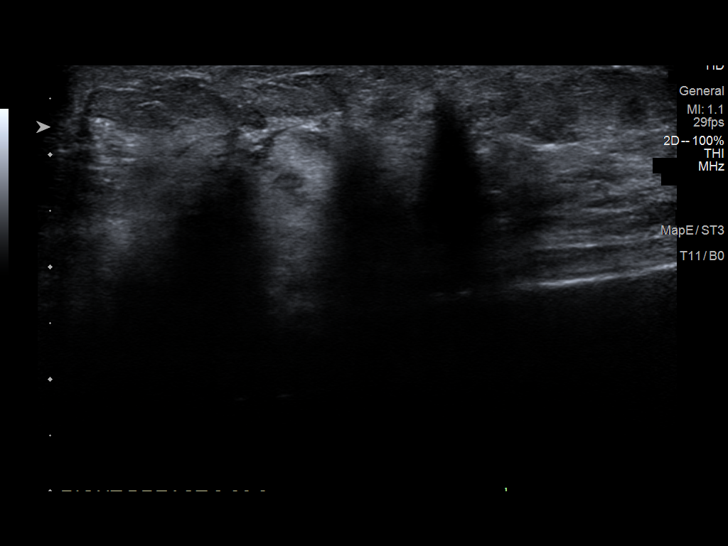
[im 23/33]
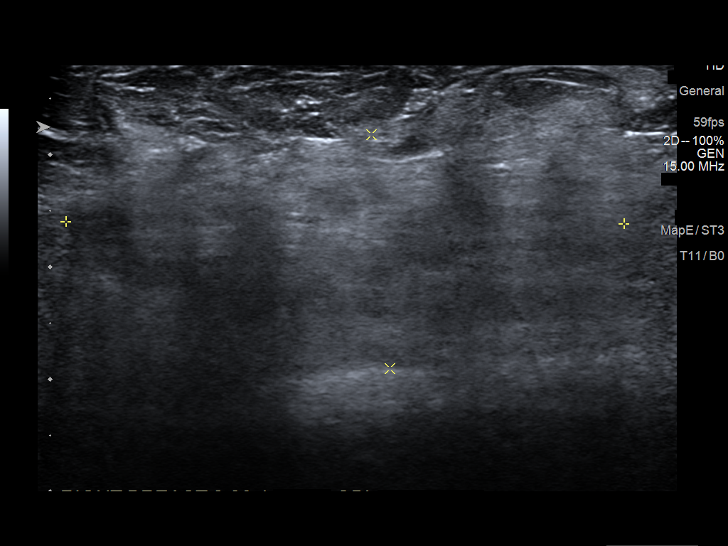
[im 26/33]
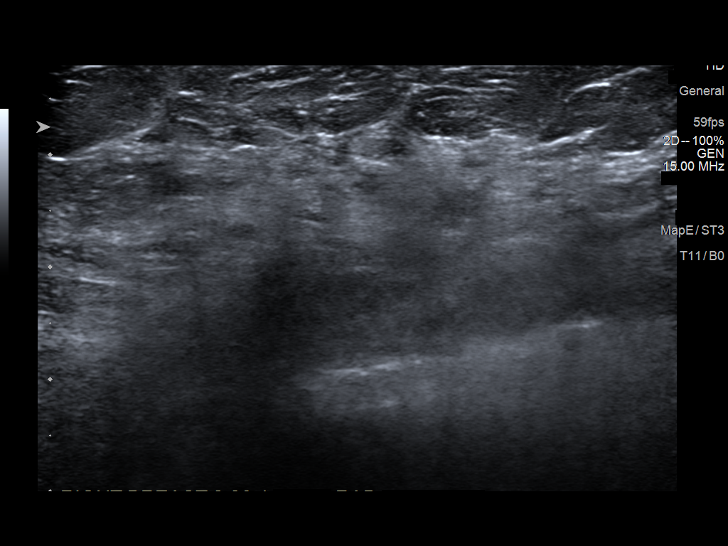
[im 29/33]
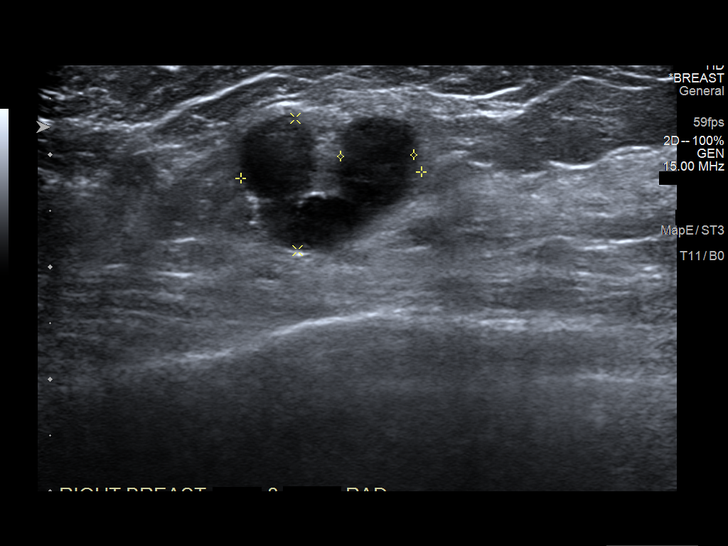
[im 31/33]
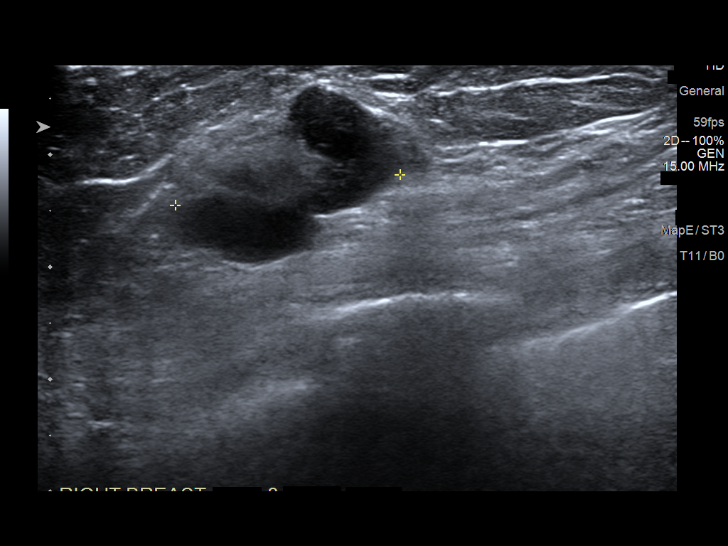

[12 of 25 positions shown; findings below may reference images not displayed]

ACR Breast Density Category c: The breast tissue is heterogeneously
dense, which may obscure small masses.
FINDINGS: There is a mild skin indentation at the location of palpable concern
in the lower outer right breast, marked with a metallic marker.
There is also a mild distortion in the posterior aspect of the lower
outer quadrant of the right breast. There is also an interval oval,
circumscribed mass in axillary tail region of the right breast or
inferior right axilla. There are also 2 partially included abnormal
appearing right axillary lymph nodes. No skin thickening or
suspicious calcifications are seen.

An area of asymmetrical density in the posterior aspect of the upper
inner left breast is mammographically stable over many years,
compatible with a benign asymmetry. No interval findings suspicious
for malignancy in the left breast.

Mammographic images were processed with CAD.

On physical exam, there is an approximately 5 x 3 cm crescent-shaped
irregular, palpable mass in the periphery of the lower outer
quadrant of the right breast, spanning the entire quadrant. There is
a small, benign appearing, oval red skin lesion at that location,
corresponding to the area of patient concern. There is also dimpling
of the skin in that region.

Also demonstrated is an approximately 2 cm oval, circumscribed,
palpable mass in the 10 o'clock position of the right breast, 8 cm
from the nipple.

There are no palpable right axillary lymph nodes.

Targeted ultrasound is performed, showing an irregular hypoechoic
area with intermixed and surrounding ill-defined increased
echogenicity and posterior acoustical shadowing in the lower outer
quadrant of the right breast. This spans an area measuring
approximately 5.0 x 4.0 x 1.9 cm, spanning entire lower outer
quadrant of the breast. This is more focal with associated
architectural distortion in the 8:30 o'clock position, 5 cm from the
nipple, measuring approximately 3.0 x 2.7 x 1.6 cm in maximum
dimensions.

In the 10 o'clock position of the right breast, 8 cm from the
nipple, there is an enlarged intramammary lymph node with oval,
lobulated areas of cortical thickening measuring up to 6.5 mm in
maximum thickness with compression of the normal fatty hilum,
ill-defined decreased echogenicity in the fatty hilum and some
ill-defined increased and decreased echogenicity in the adjacent
breast. This has very prominent internal blood flow with power
Doppler.

In the right axilla, there are 2 at adjacent abnormal appearing
right axillary nodes. The larger has marked eccentric cortical
thickening measuring 11.1 mm in maximum thickness. The adjacent node
has a maximum cortical thickness of 4.5 mm.
IMPRESSION: 1. Ill-defined heterogeneous mass with posterior acoustical
shadowing spanning the entire lower outer quadrant of the right
breast and measuring approximately 5.0 x 4.0 x 1.9 cm in maximum
dimensions with associated architectural distortion and skin
dimpling. This is suspicious for invasive lobular carcinoma.
2. Probable metastatic intramammary lymph node in the 10 o'clock
position of the right breast.
3. 2 probable metastatic right axillary lymph nodes.

RECOMMENDATION:
1. Ultrasound-guided core needle biopsy of the 3.0 cm more focal
abnormality with associated architectural distortion in the 8:30
o'clock position of the right breast, 5 cm from the nipple.
2. Ultrasound-guided core needle biopsy of the larger abnormal right
axillary lymph node.
3. If the biopsies are positive for malignancy and especially if
they have a lobular phenotype, further evaluation with pre and
postcontrast magnetic resonance imaging of the breasts would also be
recommended.

The results and recommendations have been discussed with the patient
and the biopsies have been scheduled at [DATE] a.m. on [DATE].

I have discussed the findings and recommendations with the patient.
If applicable, a reminder letter will be sent to the patient
regarding the next appointment.

BI-RADS CATEGORY  5: Highly suggestive of malignancy.

## 2019-07-26 IMAGING — MG DIGITAL DIAGNOSTIC BILAT W/ TOMO W/ CAD
5 of 10 series · 5 of 30 positions shown · non-contrast
Comparison: Previous exam(s).

CLINICAL DATA: Pea-sized mass felt by the patient for the past
month in the lower outer right breast adjacent to a red skin lesion
that she thinks may be an insect bite. This initially had associated
blue coloration of the skin at the site of the presumed bite. She
had a [TE] vaccine in the left arm [DATE].

EXAM:
DIGITAL DIAGNOSTIC BILATERAL MAMMOGRAM WITH CAD AND TOMO
ULTRASOUND RIGHT BREAST

[L CC synth-2D]
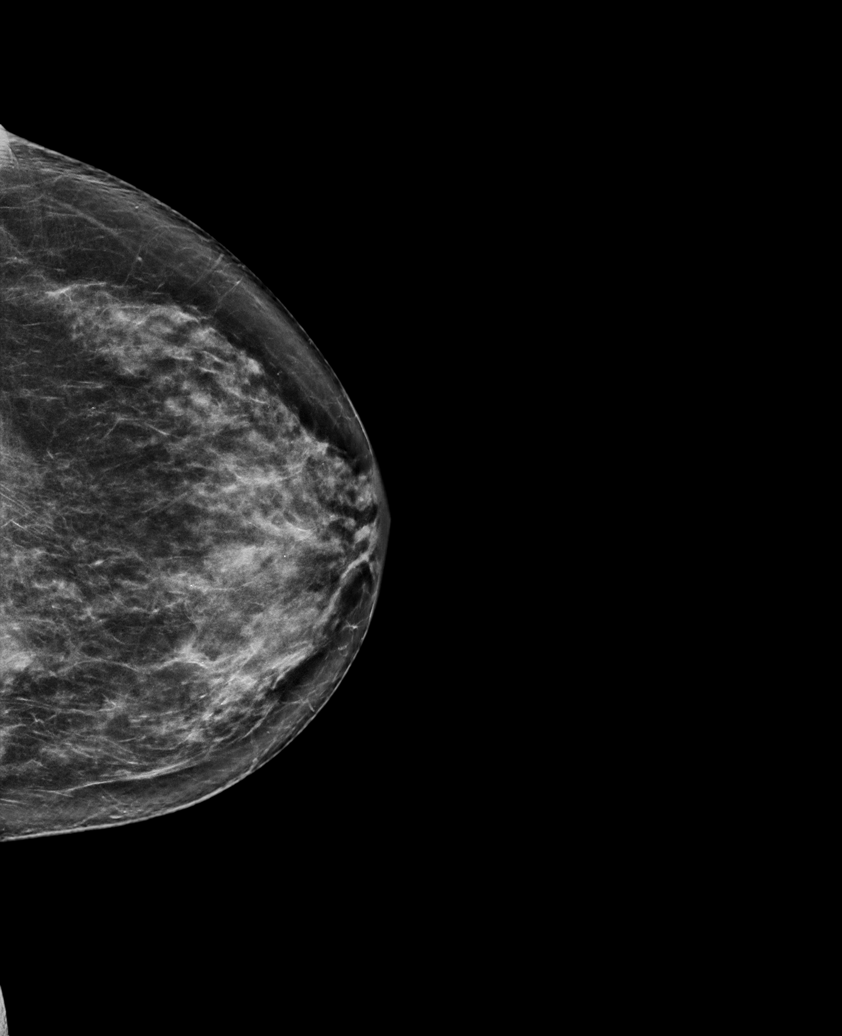

[R CC synth-2D]
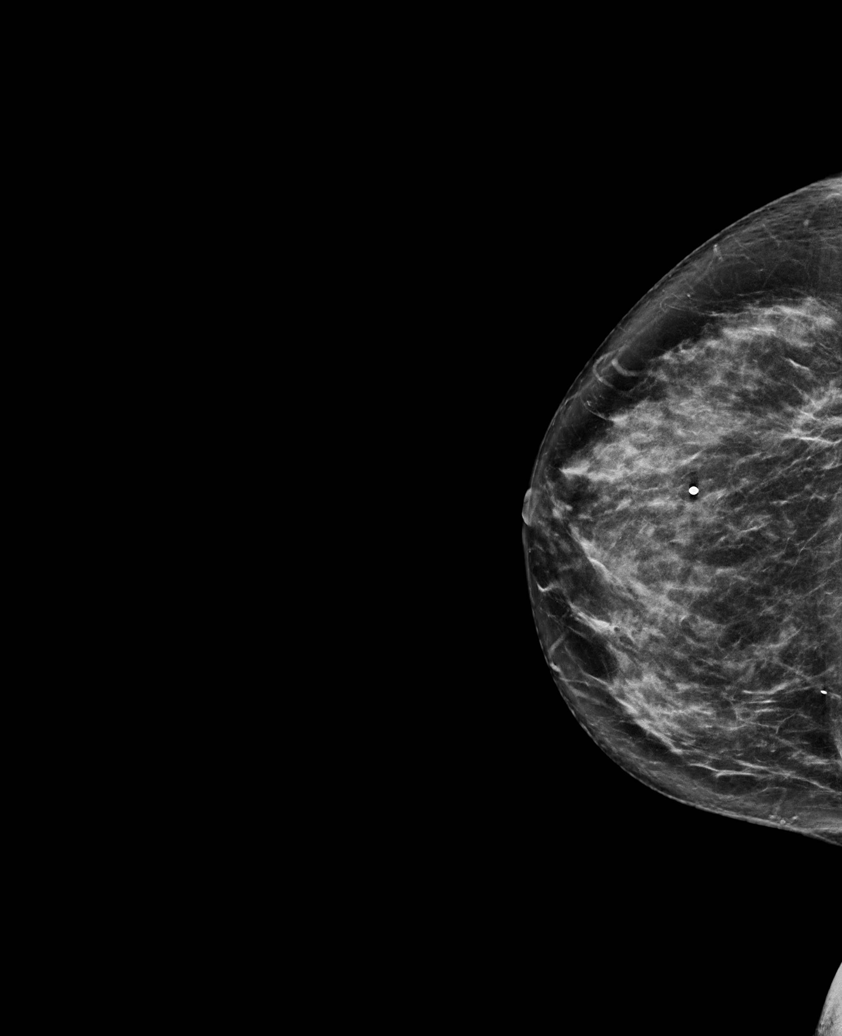

[R MLO synth-2D]
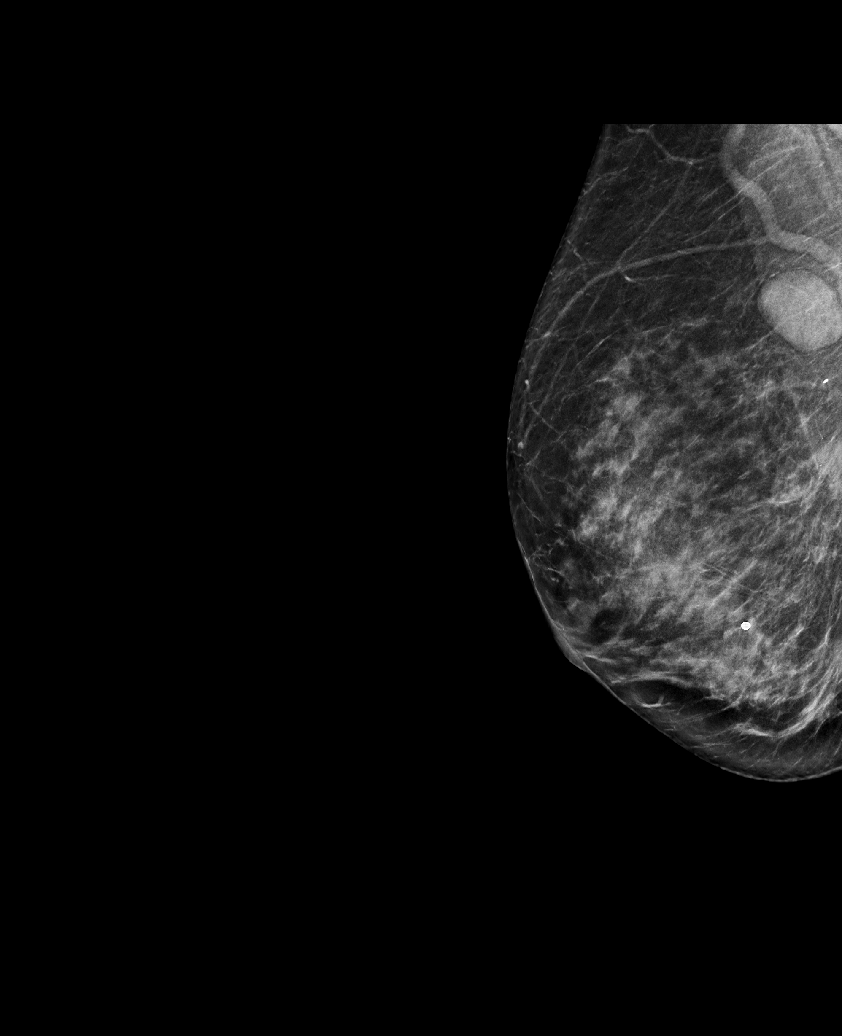

[R TAN synth-2D]
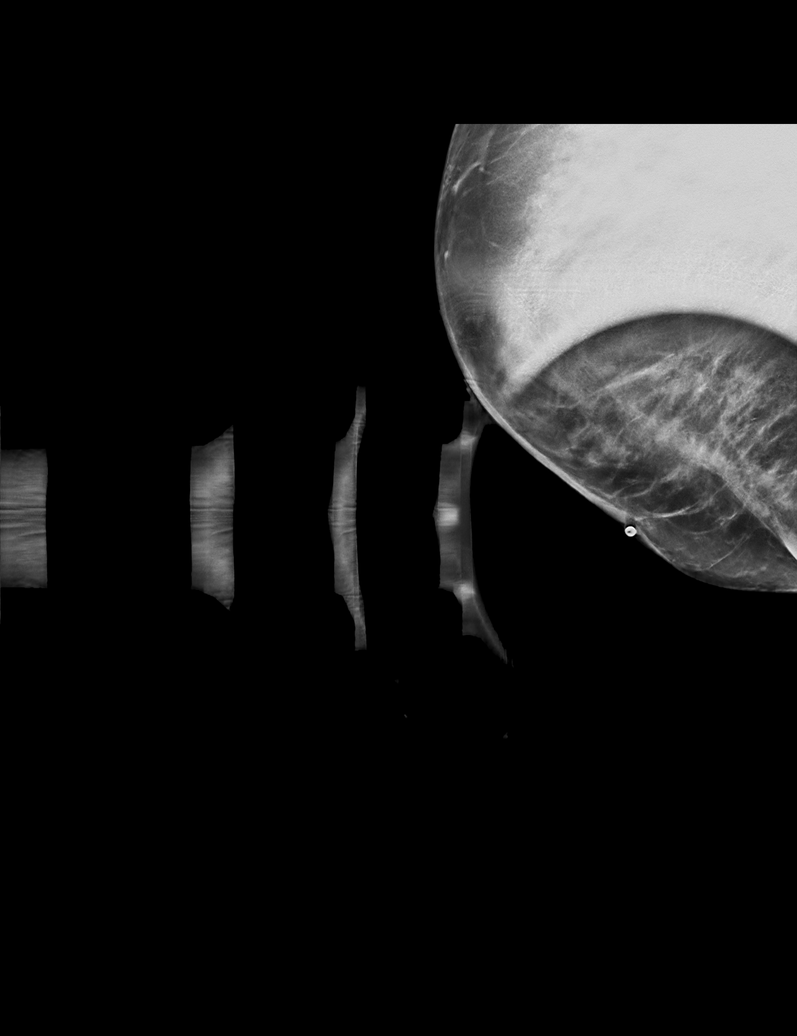

[L MLO synth-2D]
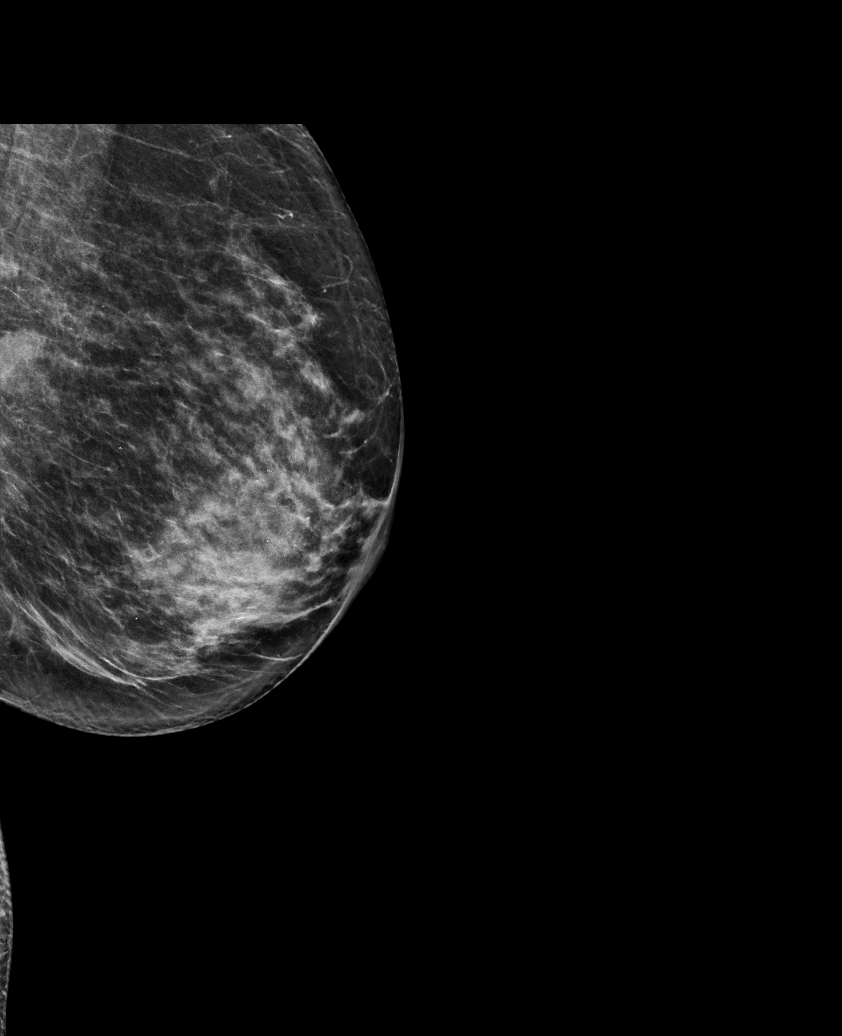

[5 of 30 positions shown; findings below may reference images not displayed]

ACR Breast Density Category c: The breast tissue is heterogeneously
dense, which may obscure small masses.
FINDINGS: There is a mild skin indentation at the location of palpable concern
in the lower outer right breast, marked with a metallic marker.
There is also a mild distortion in the posterior aspect of the lower
outer quadrant of the right breast. There is also an interval oval,
circumscribed mass in axillary tail region of the right breast or
inferior right axilla. There are also 2 partially included abnormal
appearing right axillary lymph nodes. No skin thickening or
suspicious calcifications are seen.

An area of asymmetrical density in the posterior aspect of the upper
inner left breast is mammographically stable over many years,
compatible with a benign asymmetry. No interval findings suspicious
for malignancy in the left breast.

Mammographic images were processed with CAD.

On physical exam, there is an approximately 5 x 3 cm crescent-shaped
irregular, palpable mass in the periphery of the lower outer
quadrant of the right breast, spanning the entire quadrant. There is
a small, benign appearing, oval red skin lesion at that location,
corresponding to the area of patient concern. There is also dimpling
of the skin in that region.

Also demonstrated is an approximately 2 cm oval, circumscribed,
palpable mass in the 10 o'clock position of the right breast, 8 cm
from the nipple.

There are no palpable right axillary lymph nodes.

Targeted ultrasound is performed, showing an irregular hypoechoic
area with intermixed and surrounding ill-defined increased
echogenicity and posterior acoustical shadowing in the lower outer
quadrant of the right breast. This spans an area measuring
approximately 5.0 x 4.0 x 1.9 cm, spanning entire lower outer
quadrant of the breast. This is more focal with associated
architectural distortion in the 8:30 o'clock position, 5 cm from the
nipple, measuring approximately 3.0 x 2.7 x 1.6 cm in maximum
dimensions.

In the 10 o'clock position of the right breast, 8 cm from the
nipple, there is an enlarged intramammary lymph node with oval,
lobulated areas of cortical thickening measuring up to 6.5 mm in
maximum thickness with compression of the normal fatty hilum,
ill-defined decreased echogenicity in the fatty hilum and some
ill-defined increased and decreased echogenicity in the adjacent
breast. This has very prominent internal blood flow with power
Doppler.

In the right axilla, there are 2 at adjacent abnormal appearing
right axillary nodes. The larger has marked eccentric cortical
thickening measuring 11.1 mm in maximum thickness. The adjacent node
has a maximum cortical thickness of 4.5 mm.
IMPRESSION: 1. Ill-defined heterogeneous mass with posterior acoustical
shadowing spanning the entire lower outer quadrant of the right
breast and measuring approximately 5.0 x 4.0 x 1.9 cm in maximum
dimensions with associated architectural distortion and skin
dimpling. This is suspicious for invasive lobular carcinoma.
2. Probable metastatic intramammary lymph node in the 10 o'clock
position of the right breast.
3. 2 probable metastatic right axillary lymph nodes.

RECOMMENDATION:
1. Ultrasound-guided core needle biopsy of the 3.0 cm more focal
abnormality with associated architectural distortion in the 8:30
o'clock position of the right breast, 5 cm from the nipple.
2. Ultrasound-guided core needle biopsy of the larger abnormal right
axillary lymph node.
3. If the biopsies are positive for malignancy and especially if
they have a lobular phenotype, further evaluation with pre and
postcontrast magnetic resonance imaging of the breasts would also be
recommended.

The results and recommendations have been discussed with the patient
and the biopsies have been scheduled at [DATE] a.m. on [DATE].

I have discussed the findings and recommendations with the patient.
If applicable, a reminder letter will be sent to the patient
regarding the next appointment.

BI-RADS CATEGORY  5: Highly suggestive of malignancy.

## 2019-08-01 ENCOUNTER — Other Ambulatory Visit: Payer: Self-pay

## 2019-08-01 ENCOUNTER — Ambulatory Visit
Admission: RE | Admit: 2019-08-01 | Discharge: 2019-08-01 | Disposition: A | Discharge status: Home / Self Care | Payer: Commercial Managed Care - PPO | Source: Ambulatory Visit | Attending: Obstetrics and Gynecology | Admitting: Obstetrics and Gynecology

## 2019-08-01 ENCOUNTER — Other Ambulatory Visit: Payer: Self-pay | Admitting: Obstetrics and Gynecology

## 2019-08-01 ENCOUNTER — Other Ambulatory Visit: Payer: Self-pay | Admitting: Diagnostic Radiology

## 2019-08-01 DIAGNOSIS — N6310 Unspecified lump in the right breast, unspecified quadrant: Secondary | ICD-10-CM

## 2019-08-01 DIAGNOSIS — R2231 Localized swelling, mass and lump, right upper limb: Secondary | ICD-10-CM

## 2019-08-01 IMAGING — MG MM BREAST LOCALIZATION CLIP
6 of 10 series · 6 of 30 positions shown · non-contrast
Comparison: Previous exam(s).

CLINICAL DATA: 63-year-old female status post 3 area
ultrasound-guided biopsy of the right breast/axilla.

EXAM:
DIAGNOSTIC RIGHT MAMMOGRAM POST ULTRASOUND BIOPSY

[R LM synth-2D]
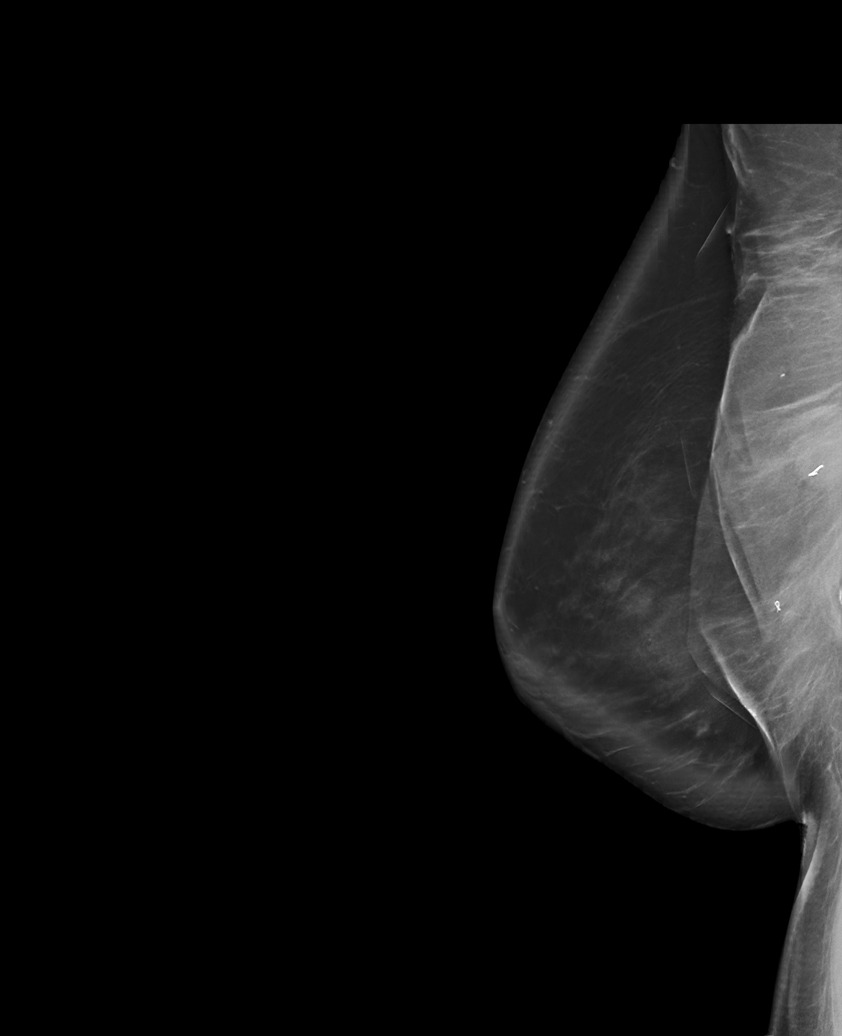

[R CC synth-2D]
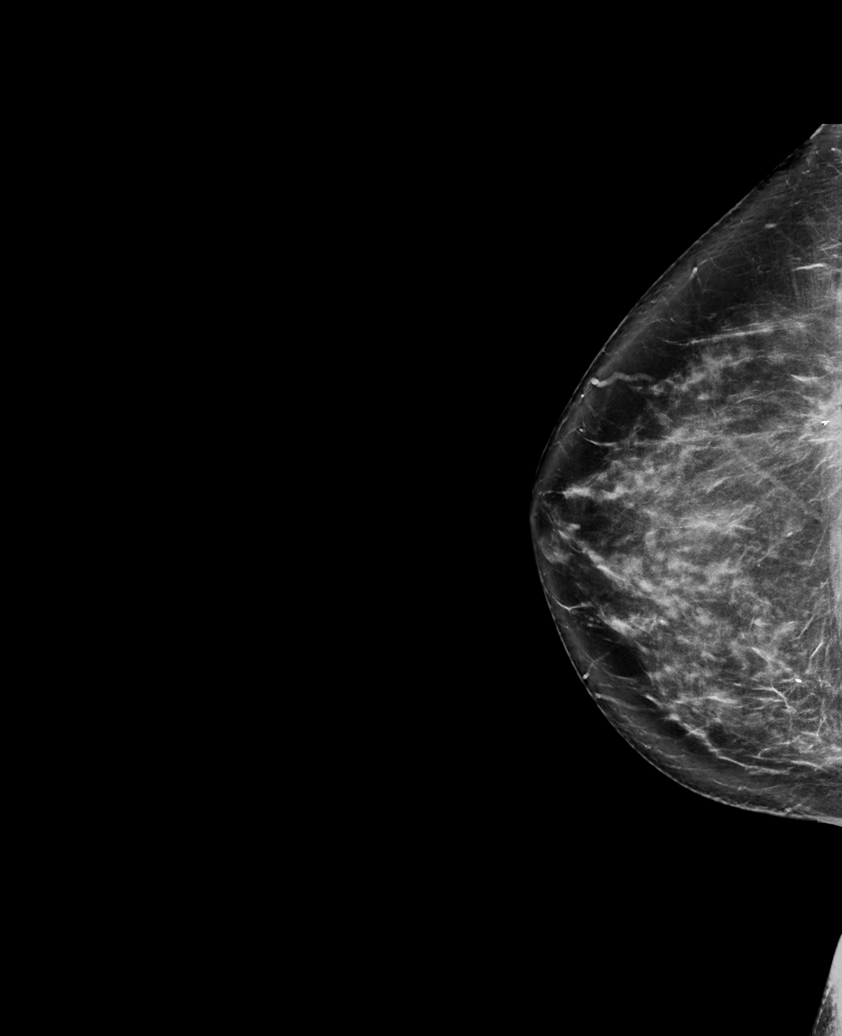

[R MLO synth-2D (1 of 2)]
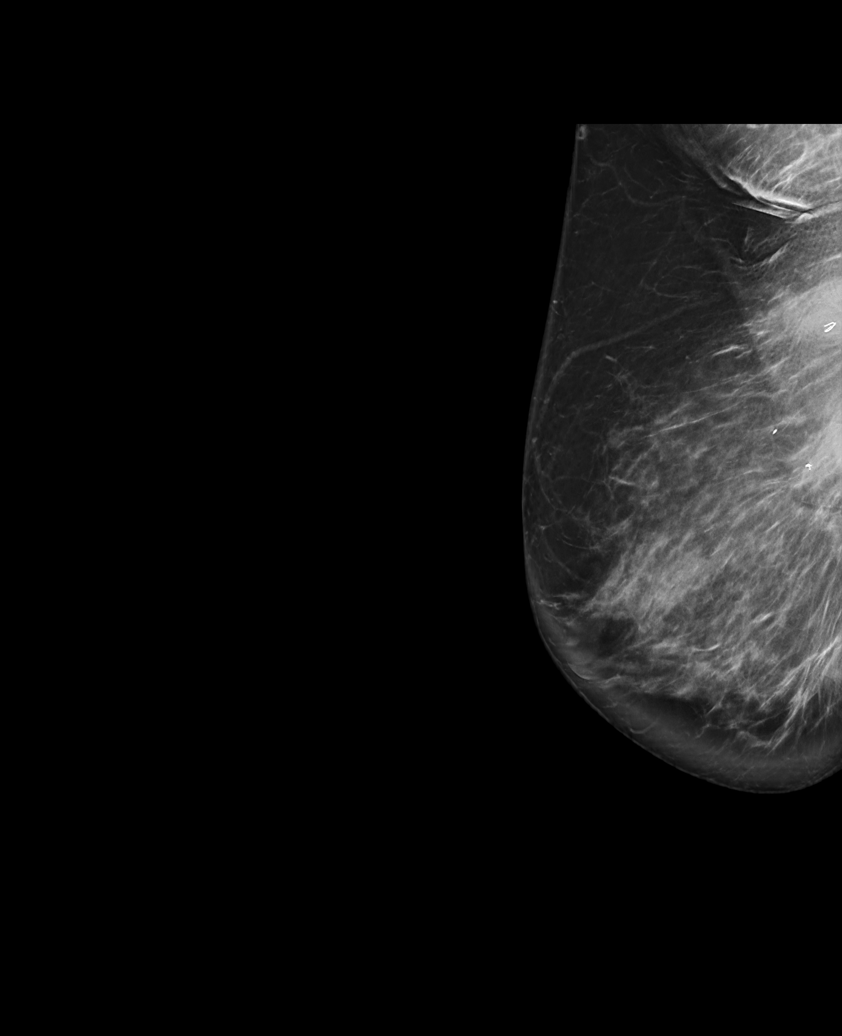

[R XCCL synth-2D]
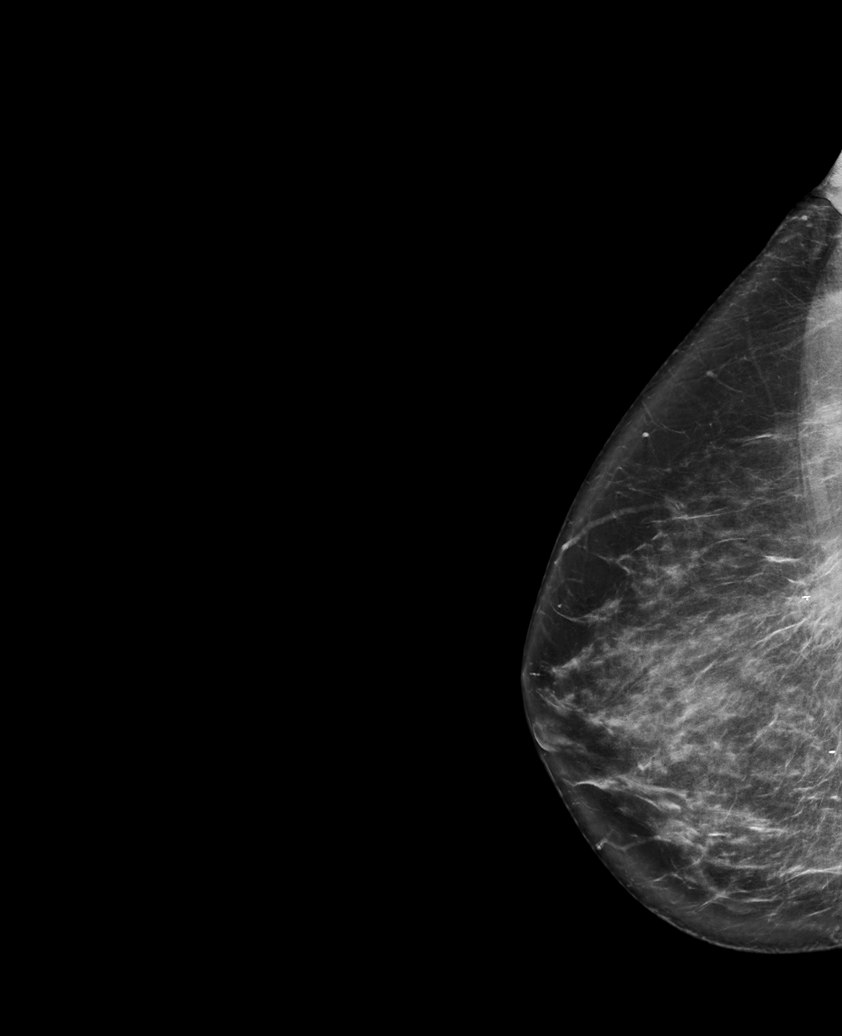

[R MLO synth-2D (2 of 2)]
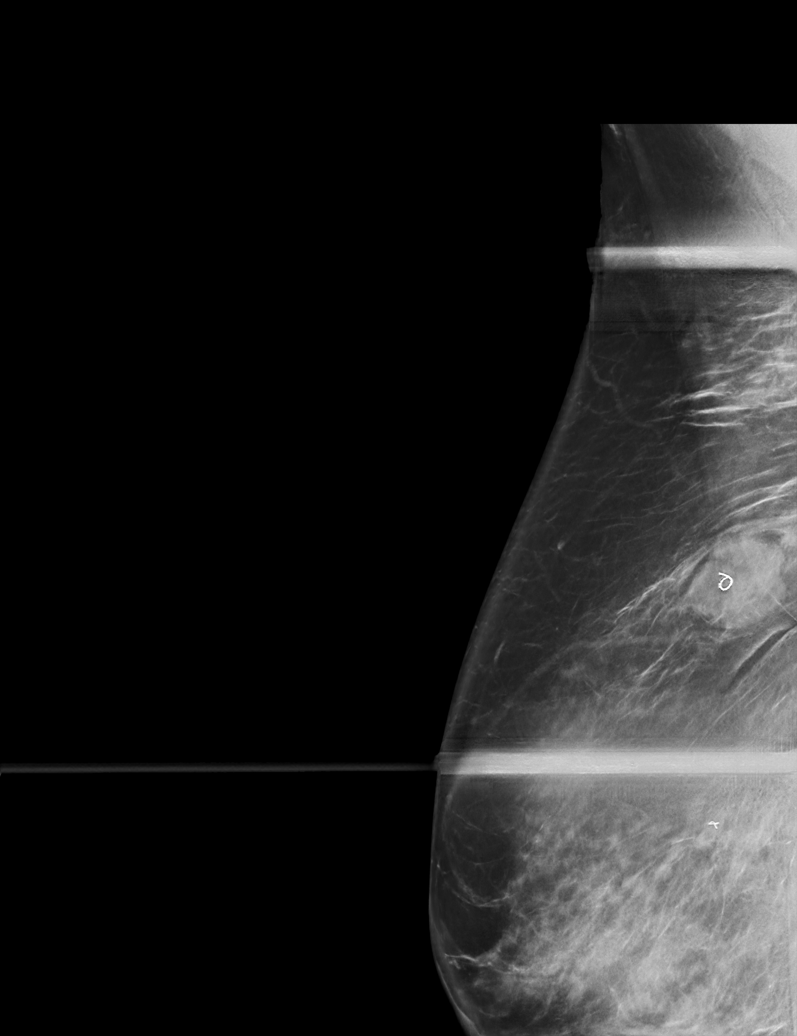

[R CC tomo · tomo slice 42/83.0]
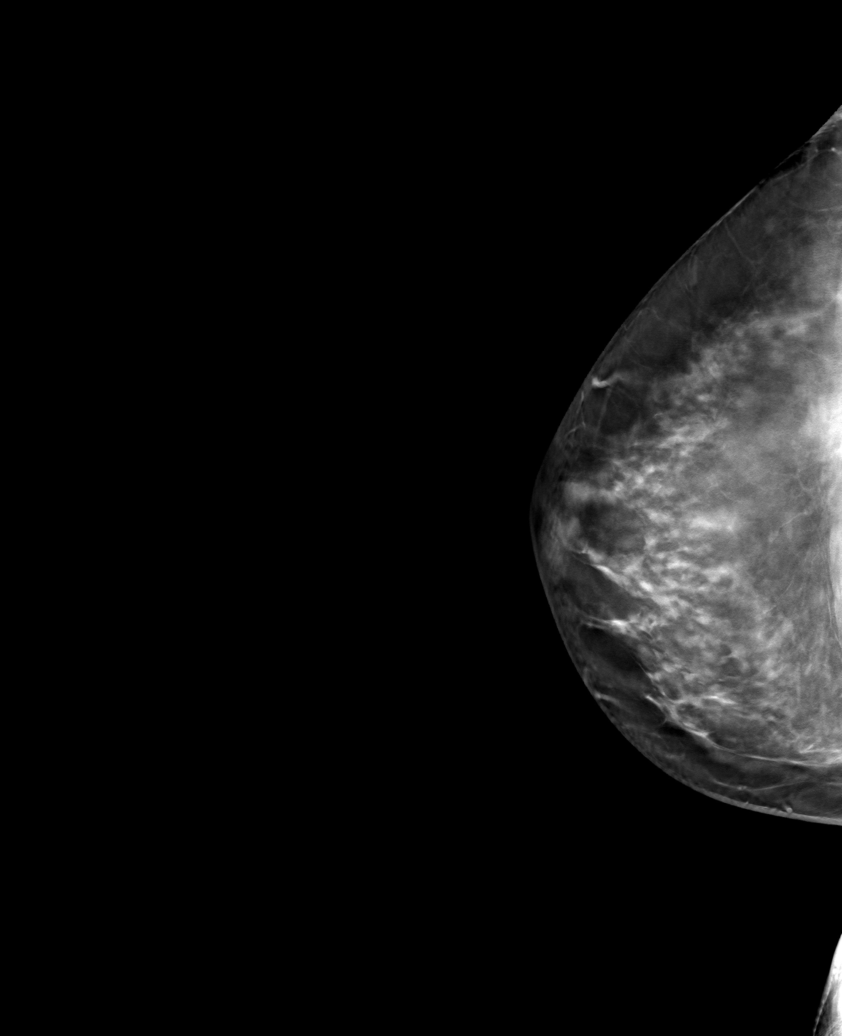

[6 of 30 positions shown; findings below may reference images not displayed]

FINDINGS: Mammographic images were obtained following ultrasound guided biopsy
of the right breast and axilla x3. The visualized biopsy marking
clips are in the expected location in the lower outer right breast
at posterior depth and an upper outer quadrant intramammary lymph
node. The right axillary lymph node is not visualized on today's
views due to the deep location.
IMPRESSION: Appropriate positioning of the ribbon in Q shaped shaped biopsy
marking clip at the site of biopsy in the lateral, far posterior
right breast.

Final Assessment: Post Procedure Mammograms for Marker Placement

## 2019-08-01 IMAGING — US US  BREAST BX W/ LOC DEV 1ST LESION IMG BX SPEC US GUIDE*R*
1 series · 16 of 22 positions shown · non-contrast
Comparison: Previous exam(s).
COMPARISON: Previous exam(s).

Addendum:
CLINICAL DATA: 63-year-old female with a suspicious right breast
mass, intramammary lymph node and right axillary lymph node.

EXAM:
ULTRASOUND GUIDED RIGHT BREAST/AXILLA CORE NEEDLE BIOPSY x 3

[Series 1: us breast bx w/ loc dev 1st lesion img bx spec us  · 0.07mm/px · 16 of 22 slices shown]
[im 1/22]
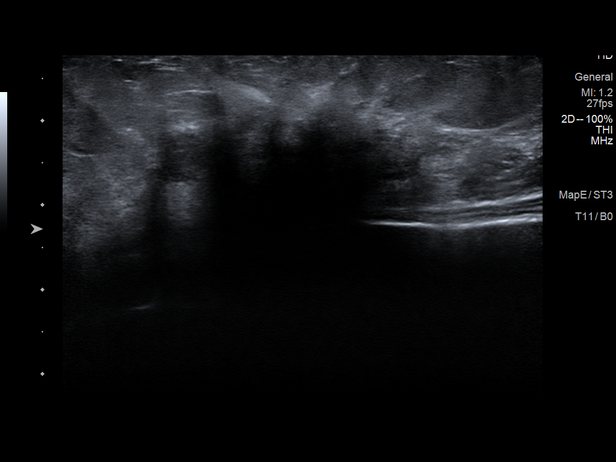
[im 3/22]
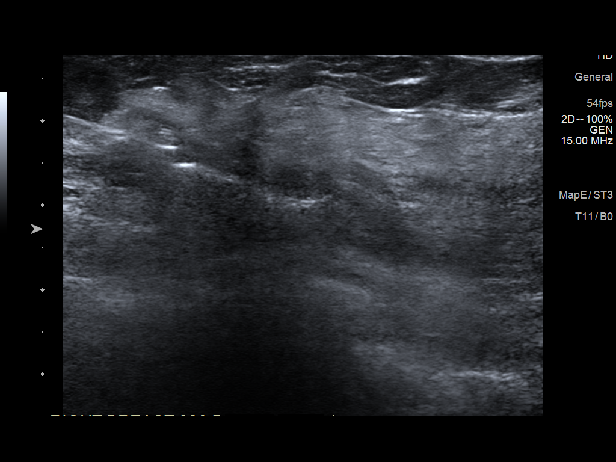
[im 4/22]
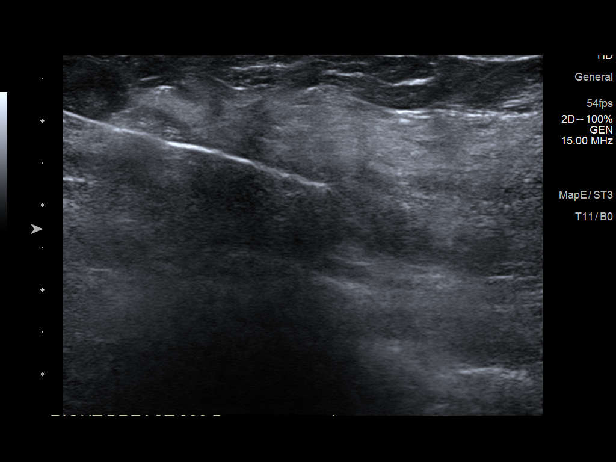
[im 5/22]
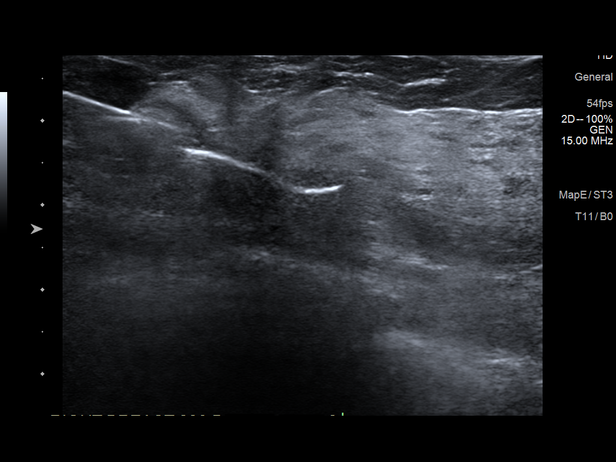
[im 7/22]
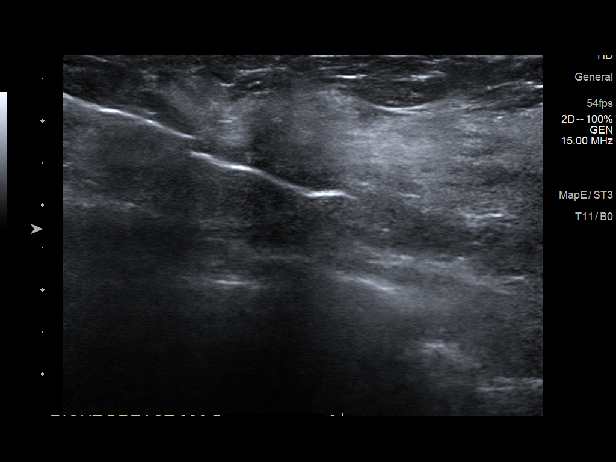
[im 8/22]
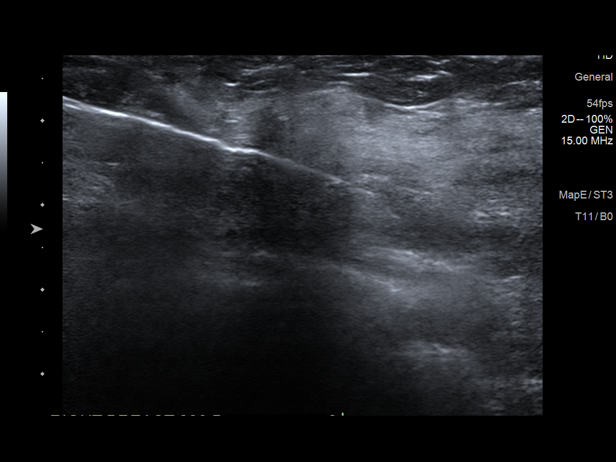
[im 9/22]
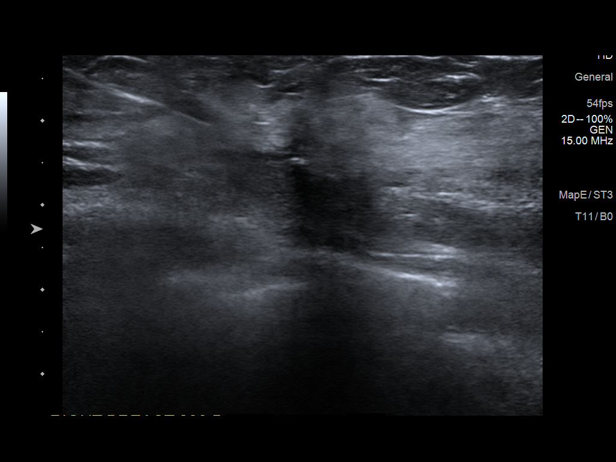
[im 11/22]
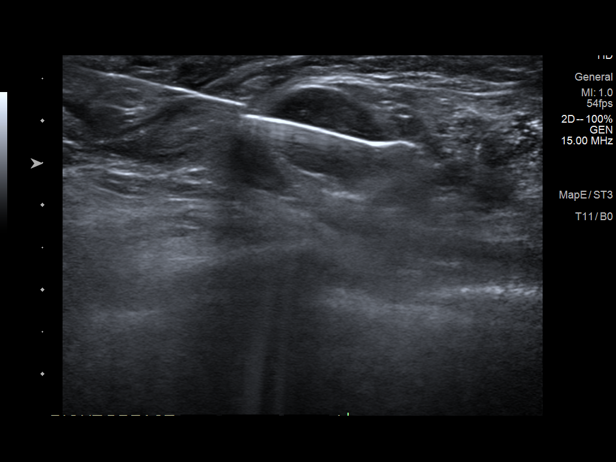
[im 12/22]
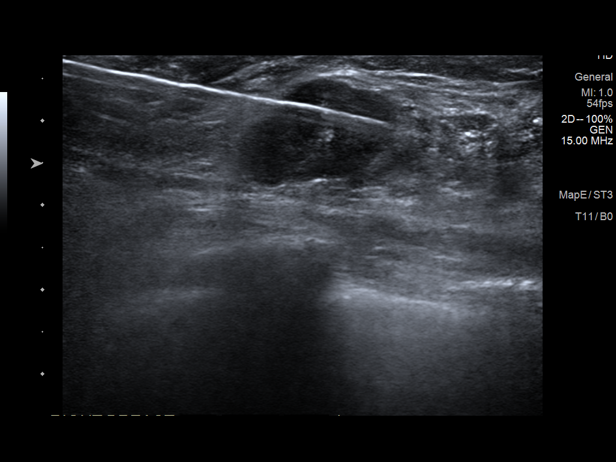
[im 14/22]
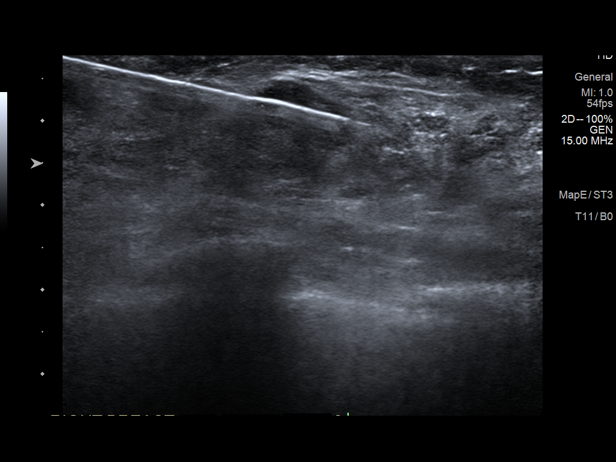
[im 15/22]
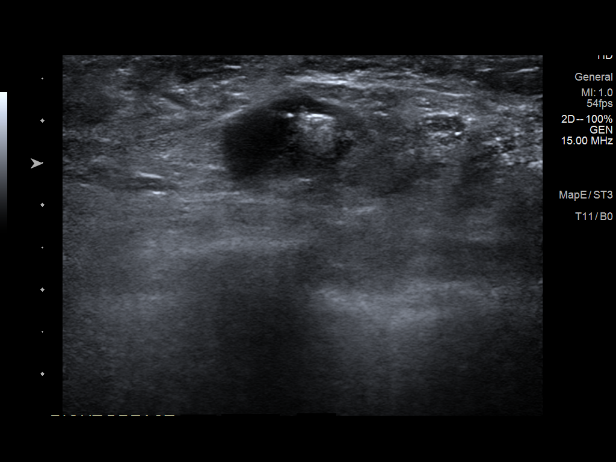
[im 16/22]
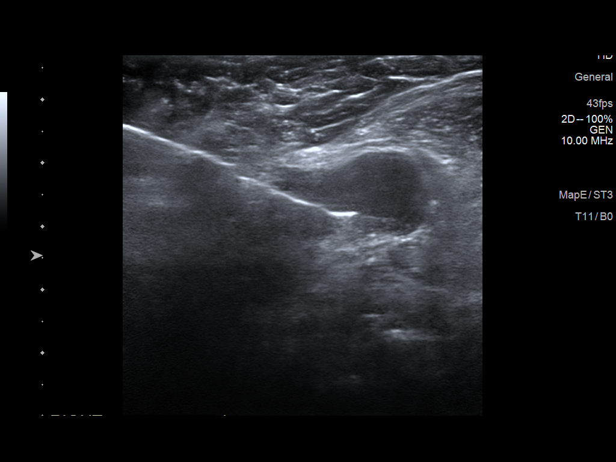
[im 18/22]
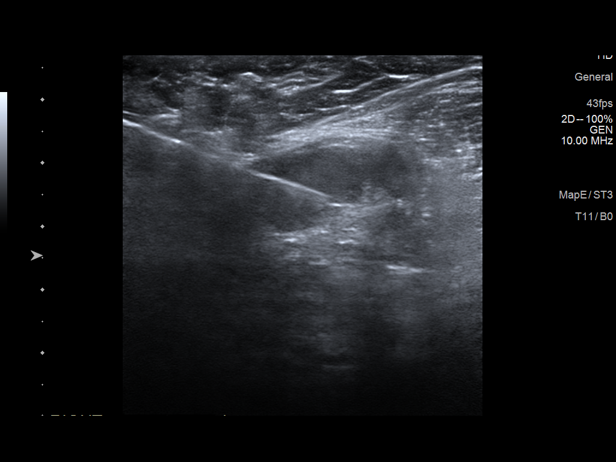
[im 19/22]
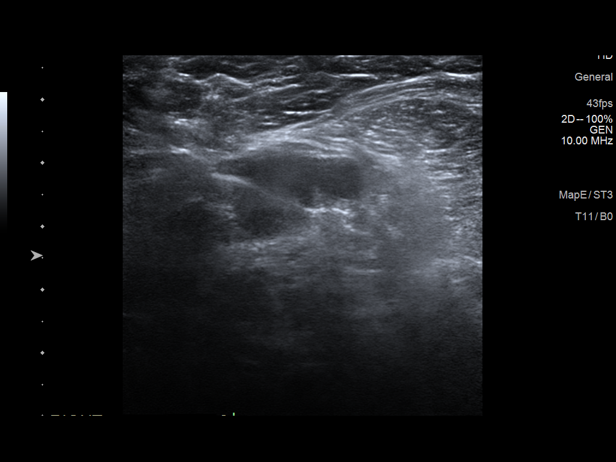
[im 20/22]
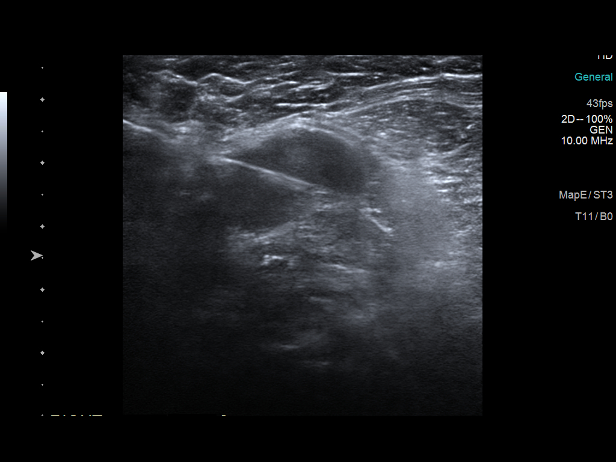
[im 22/22]
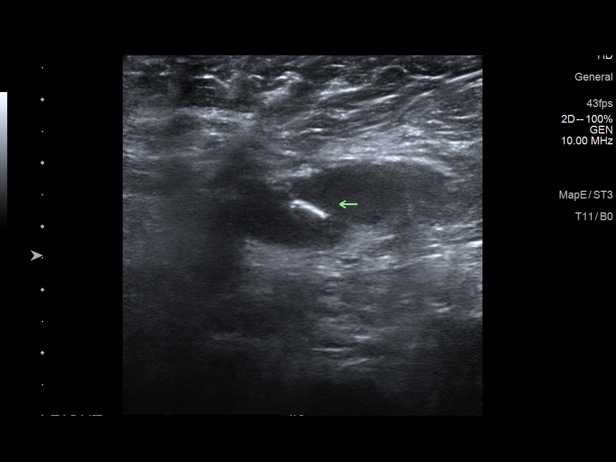

[16 of 22 positions shown; findings below may reference images not displayed]



Lesion quadrant: Lower outer quadrant

Using sterile technique and 1% Lidocaine as local anesthetic, under
direct ultrasound visualization, a 12 gauge ERGEIS device was
used to perform biopsy of a shadowing mass at the [DATE] position 5 cm
from the nipple using a lateral approach. At the conclusion of the
procedure ribbon shaped tissue marker clip was deployed into the
biopsy cavity.

Using sterile technique and 1% Lidocaine as local anesthetic, under
direct ultrasound visualization, a 14 gauge ERGEIS device was
used to perform biopsy of an intramammary lymph node at the 10
o'clock position 8 cm from the nipple using a lateral approach. At
the conclusion of the procedure Q shaped tissue marker clip was
deployed into the biopsy cavity.

Using sterile technique and 1% Lidocaine as local anesthetic, under
direct ultrasound visualization, a 14 gauge ERGEIS device was
used to perform biopsy of an axillary lymph node using a lateral
approach. At the conclusion of the procedure HydroMARK tissue marker
clip was deployed into the biopsy cavity. The first HydroMARK clip
slipped to the periphery of the node. Therefore, a second HydroMARK
clip was placed centrally within the lymph node.

Follow up 2 view mammogram was performed and dictated separately.
IMPRESSION: Ultrasound guided biopsy of a right breast mass, a right
intramammary lymph node and a right axillary lymph node. No apparent
complications.

ADDENDUM:
Pathology revealed GRADE I INVASIVE MAMMARY CARCINOMA, MAMMARY
CARCINOMA IN SITU of the Right breast, 8:30 o'clock, [0A]. This was
found to be concordant by Dr. ERGEIS.

Pathology revealed INTRAMAMMARY LYMPH NODE WITH METASTATIC MAMMARY
CARCINOMA of the Right breast, 10 o'clock, [0A]. This was found to
be concordant by Dr. ERGEIS.

Pathology revealed INVASIVE MAMMARY CARCINOMA of the Right axillary
lymph node. There is no residual lymph node tissue and this likely
represents a completely replaced lymph node. This was found to be
concordant by Dr. ERGEIS.

Pathology results were discussed with the patient by telephone. The
patient reported doing well after the biopsies with minimal
tenderness at the sites. Post biopsy instructions and care were
reviewed and questions were answered. The patient was encouraged to
call The [REDACTED] for any additional
concerns. My direct phone number was provided for the patient.

Surgical consultation has been arranged with Dr. ERGEIS at
[REDACTED] on [DATE].

Recommendation for a bilateral breast MRI for further evaluation of
extent of disease.

Pathology results reported by ERGEIS, RN on [DATE].



Lesion quadrant: Lower outer quadrant

Using sterile technique and 1% Lidocaine as local anesthetic, under
direct ultrasound visualization, a 12 gauge ERGEIS device was
used to perform biopsy of a shadowing mass at the [DATE] position 5 cm
from the nipple using a lateral approach. At the conclusion of the
procedure ribbon shaped tissue marker clip was deployed into the
biopsy cavity.

Using sterile technique and 1% Lidocaine as local anesthetic, under
direct ultrasound visualization, a 14 gauge ERGEIS device was
used to perform biopsy of an intramammary lymph node at the 10
o'clock position 8 cm from the nipple using a lateral approach. At
the conclusion of the procedure Q shaped tissue marker clip was
deployed into the biopsy cavity.

Using sterile technique and 1% Lidocaine as local anesthetic, under
direct ultrasound visualization, a 14 gauge ERGEIS device was
used to perform biopsy of an axillary lymph node using a lateral
approach. At the conclusion of the procedure HydroMARK tissue marker
clip was deployed into the biopsy cavity. The first HydroMARK clip
slipped to the periphery of the node. Therefore, a second HydroMARK
clip was placed centrally within the lymph node.

Follow up 2 view mammogram was performed and dictated separately.
IMPRESSION: Ultrasound guided biopsy of a right breast mass, a right
intramammary lymph node and a right axillary lymph node. No apparent
complications.

## 2019-08-02 ENCOUNTER — Telehealth: Payer: Self-pay | Admitting: Hematology and Oncology

## 2019-08-02 NOTE — Telephone Encounter (Signed)
Received a msg from breast navigator to schedule a new pt appt for breast cancer. [name] has been cld and scheduled to see Dr. Lindi Adie on 6/23 at 345pm. Pt aware to arrive 15 minutes early.

## 2019-08-07 NOTE — Progress Notes (Signed)
[facility] CONSULT NOTE  Patient Care Team: Patient, No Pcp Per as PCP - General (General Practice)  CHIEF COMPLAINTS/PURPOSE OF CONSULTATION:  Newly diagnosed breast cancer  HISTORY OF PRESENTING ILLNESS:  [name] 64 y.o. female is here because of recent diagnosis of right breast cancer. Patient palpated a right breast mass x1 month. Mammogram and [place] on 07/26/19 showed a lower outer quadrant right breast mass, 5.0cm, an area of architectural distortion at the 8:30 position, 3.0cm, an enlarged intramammary lymph node at the 10 o'clock position, and 2 enlarged right axillary lymph nodes. Biopsy on 08/01/19 showed invasive and in situ mammary carcinoma in the breast at the 8:30 and 10 o'clock positions, and in the right axilla, grade 1, HER-2 equivocal by IHC, negative by FISH, ER+ 90%, PR+ 10%, Ki67 10%. She presents to the clinic today for initial evaluation and discussion of treatment options.   I reviewed her records extensively and collaborated the history with the patient.  SUMMARY OF ONCOLOGIC HISTORY: Oncology History  Malignant neoplasm of lower-outer quadrant of right breast of female, estrogen receptor positive ([place])  08/01/2019 Initial Diagnosis   Palpable right breast mass for 1 month.  Mammogram revealed a 5 cm mass 8:30 position, 3 cm intramammary lymph node at 10 o'clock position and 2 enlarged right axillary lymph nodes.  Biopsy revealed grade 1 IDC ER 90%, PR 10%, Ki-67 10%, HER-2 negative.  Intramammary node and axillary lymph node both positive with similar prognostic profile     MEDICAL HISTORY: Hypercholesterolemia  SURGICAL HISTORY: No prior surgeries  SOCIAL HISTORY: Drinks occasional wine, denies any tobacco or recreational drug use FAMILY HISTORY: Family History  Problem Relation Age of Onset  . Breast cancer Paternal Grandmother 69    ALLERGIES:  has no allergies on file.  MEDICATIONS:   REVIEW OF SYSTEMS:   Constitutional: Denies  fevers, chills or abnormal night sweats Eyes: Denies blurriness of vision, double vision or watery eyes Ears, nose, mouth, throat, and face: Denies mucositis or sore throat Respiratory: Denies cough, dyspnea or wheezes Cardiovascular: Denies palpitation, chest discomfort or lower extremity swelling Gastrointestinal:  Denies nausea, heartburn or change in bowel habits Skin: Denies abnormal skin rashes Lymphatics: Denies new lymphadenopathy or easy bruising Neurological:Denies numbness, tingling or new weaknesses Behavioral/Psych: Mood is stable, no new changes  Breast: Palpable right breast mass, skin dimpling  All other systems were reviewed with the patient and are negative.  PHYSICAL EXAMINATION: ECOG PERFORMANCE STATUS: 1 - Symptomatic but completely ambulatory  Vitals:   08/08/19 1536  BP: 132/76  Pulse: 89  Resp: 17  Temp: 98.9 F (37.2 C)  SpO2: 98%   Filed Weights   08/08/19 1536  Weight: 142 lb 3.2 oz (64.5 kg)    GENERAL:alert, no distress and comfortable SKIN: skin color, texture, turgor are normal, no rashes or significant lesions EYES: normal, conjunctiva are pink and non-injected, sclera clear OROPHARYNX:no exudate, no erythema and lips, buccal mucosa, and tongue normal  NECK: supple, thyroid normal size, non-tender, without nodularity LYMPH:  no palpable lymphadenopathy in the cervical, axillary or inguinal LUNGS: clear to auscultation and percussion with normal breathing effort HEART: regular rate & rhythm and no murmurs and no lower extremity edema ABDOMEN:abdomen soft, non-tender and normal bowel sounds Musculoskeletal:no cyanosis of digits and no clubbing  PSYCH: alert & oriented x 3 with fluent speech NEURO: no focal motor/sensory deficits  LABORATORY DATA:  I have reviewed the data as listed Lab Results  Component Value Date  WBC 7.8 06/12/2007   HGB 13.4 06/12/2007   HCT 38.0 06/12/2007   MCV 90.4 06/12/2007   PLT 314 06/12/2007   Lab Results   Component Value Date   NA 137 06/12/2007   K 3.7 06/12/2007   CL 105 06/12/2007   CO2 26 06/12/2007    RADIOGRAPHIC STUDIES: I have personally reviewed the radiological reports and agreed with the findings in the report.  ASSESSMENT AND PLAN:  Malignant neoplasm of lower-outer quadrant of right breast of female, estrogen receptor positive ([place]) 08/01/2019:Palpable right breast mass for 1 month.  Mammogram revealed a 5 cm mass 8:30 position, 3 cm intramammary lymph node at 10 o'clock position and 2 enlarged right axillary lymph nodes.  Biopsy revealed grade 1 IDC ER 90%, PR 10%, Ki-67 10%, HER-2 negative.  Intramammary node and axillary lymph node both positive with similar prognostic profile  Pathology and radiology counseling: Discussed with the patient, the details of pathology including the type of breast cancer,the clinical staging, the significance of ER, PR and HER-2/neu receptors and the implications for treatment. After reviewing the pathology in detail, we proceeded to discuss the different treatment options between surgery, radiation, chemotherapy, antiestrogen therapies.  Recommendation: 1.  Breast MRI to assess the full extent of the disease and to evaluate the axilla better 2.  MammaPrint testing to determine if she would benefit from chemotherapy 3.  Neoadjuvant chemotherapy versus neoadjuvant antiestrogen therapy based on MammaPrint test result 4.  Mastectomy versus breast conservation and targeted node dissection 5.  Adjuvant radiation therapy 6.  Follow-up adjuvant antiestrogen therapy  Staging: Stage IIa, T2N1  Return to clinic after MammaPrint testing to discuss neoadjuvant treatment options. We will conduct a virtual visit on 08/16/2019 to discuss the MRI report. In view of the delay in making all these decisions, I recommended that we start her today on antiestrogen therapy with anastrozole.  I sent the prescription to get to the pharmacy.  I discussed with her the  risks and benefits of anastrozole therapy.  She tells me that she had a normal bone density recently.   All questions were answered. The patient knows to call the clinic with any problems, questions or concerns.   Rulon Eisenmenger, MD, MPH 08/08/2019    I, Molly Dorshimer, am acting as scribe for [name], MD.  I have reviewed the above documentation for accuracy and completeness, and I agree with the above.

## 2019-08-08 ENCOUNTER — Other Ambulatory Visit: Payer: Self-pay | Admitting: General Surgery

## 2019-08-08 ENCOUNTER — Other Ambulatory Visit: Payer: Self-pay

## 2019-08-08 ENCOUNTER — Inpatient Hospital Stay: Payer: Commercial Managed Care - PPO | Attending: Hematology and Oncology | Admitting: Hematology and Oncology

## 2019-08-08 ENCOUNTER — Telehealth: Payer: Self-pay | Admitting: Hematology and Oncology

## 2019-08-08 DIAGNOSIS — C50511 Malignant neoplasm of lower-outer quadrant of right female breast: Secondary | ICD-10-CM | POA: Diagnosis present

## 2019-08-08 DIAGNOSIS — Z170 Estrogen receptor positive status [ER+]: Secondary | ICD-10-CM

## 2019-08-08 DIAGNOSIS — Z803 Family history of malignant neoplasm of breast: Secondary | ICD-10-CM | POA: Diagnosis not present

## 2019-08-08 DIAGNOSIS — C773 Secondary and unspecified malignant neoplasm of axilla and upper limb lymph nodes: Secondary | ICD-10-CM | POA: Diagnosis not present

## 2019-08-08 MED ORDER — ANASTROZOLE 1 MG PO TABS
1.0000 mg | ORAL_TABLET | Freq: Every day | ORAL | 3 refills | Status: DC
Start: 2019-08-08 — End: 2020-04-02

## 2019-08-08 NOTE — Assessment & Plan Note (Signed)
08/01/2019:Palpable right breast mass for 1 month.  Mammogram revealed a 5 cm mass 8:30 position, 3 cm intramammary lymph node at 10 o'clock position and 2 enlarged right axillary lymph nodes.  Biopsy revealed grade 1 IDC ER 90%, PR 10%, Ki-67 10%, HER-2 negative.  Intramammary node and axillary lymph node both positive with similar prognostic profile  Pathology and radiology counseling: Discussed with the patient, the details of pathology including the type of breast cancer,the clinical staging, the significance of ER, PR and HER-2/neu receptors and the implications for treatment. After reviewing the pathology in detail, we proceeded to discuss the different treatment options between surgery, radiation, chemotherapy, antiestrogen therapies.  Recommendation: 1.  Breast MRI to assess the full extent of the disease and to evaluate the axilla better 2.  MammaPrint testing to determine if she would benefit from chemotherapy 3.  Neoadjuvant chemotherapy versus neoadjuvant antiestrogen therapy based on MammaPrint test result 4.  Mastectomy versus breast conservation and targeted node dissection 5.  Adjuvant radiation therapy 6.  Follow-up adjuvant antiestrogen therapy  Staging: Stage IIa, T2N1  Return to clinic after MammaPrint testing to discuss neoadjuvant treatment options.

## 2019-08-08 NOTE — Telephone Encounter (Signed)
Scheduled appts per 6/22 los. Gave pt a print out of AVS.  

## 2019-08-09 ENCOUNTER — Encounter: Payer: Self-pay | Admitting: *Deleted

## 2019-08-09 ENCOUNTER — Telehealth: Payer: Self-pay | Admitting: *Deleted

## 2019-08-09 NOTE — Telephone Encounter (Signed)
Received order for mammaprint testing on core bx. Requisition faxed to pathology and [facility]. 

## 2019-08-10 ENCOUNTER — Encounter: Payer: Self-pay | Admitting: Licensed Clinical Social Worker

## 2019-08-10 NOTE — Progress Notes (Signed)
[place] [employer]  [employer] representative attempted to contact patient by phone to offer support and assess for needs for patient with newly diagnosed breast cancer. No answer. Left VM with direct contact information.     Clancy Leiner, [place], [place] Worker [facility]

## 2019-08-13 ENCOUNTER — Other Ambulatory Visit: Payer: Self-pay | Admitting: General Surgery

## 2019-08-15 ENCOUNTER — Ambulatory Visit
Admission: RE | Admit: 2019-08-15 | Discharge: 2019-08-15 | Disposition: A | Discharge status: Home / Self Care | Payer: Commercial Managed Care - PPO | Source: Ambulatory Visit | Attending: General Surgery | Admitting: General Surgery

## 2019-08-15 DIAGNOSIS — C50511 Malignant neoplasm of lower-outer quadrant of right female breast: Secondary | ICD-10-CM

## 2019-08-15 IMAGING — MR MR BREAST BILAT WO/W CM
8 of 13 series · 29 of 48 positions shown · IV contrast (gadavist)
Comparison: Recent mammogram, ultrasound and biopsy examinations.

CLINICAL DATA: Recently diagnosed grade 1 invasive mammary
carcinoma and mammary carcinoma situ 8:30 o'clock position of the
right breast, metastatic intramammary lymph node and metastatic
right axillary lymph node. Family history of breast cancer in her
paternal grandmother. Right breast pain for the past week.

LABS:  None obtained on site today.
EXAM:
BILATERAL BREAST MRI WITH AND WITHOUT CONTRAST
TECHNIQUE: Multiplanar, multisequence MR images of both breasts were obtained
prior to and following the intravenous administration of 6 ml of
Gadavist

[Series 2: t2_tirm_tra ipat (a-p) · axial · 3.0mm · 0.70mm/px · 1 of 55 slices shown]
[im 1/55]
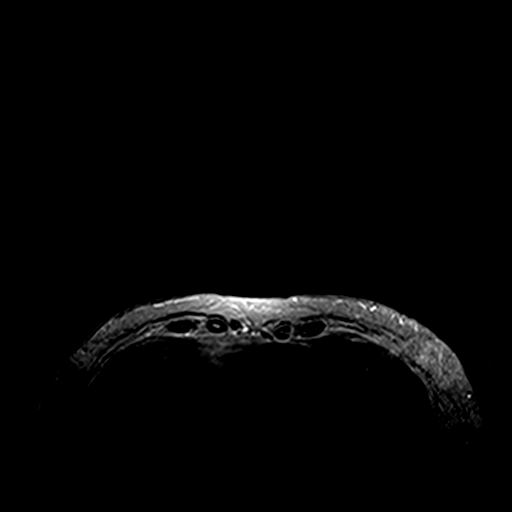

[Series 3: fl3d pre-cm no · axial · non-contrast · 1.2mm · 0.94mm/px · z∈[-74,+98]mm · 5 of 144 slices shown]
[im 1/144]
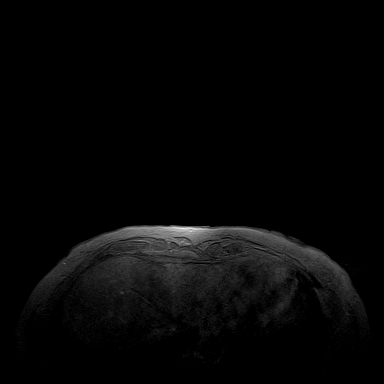
[im 36/144]
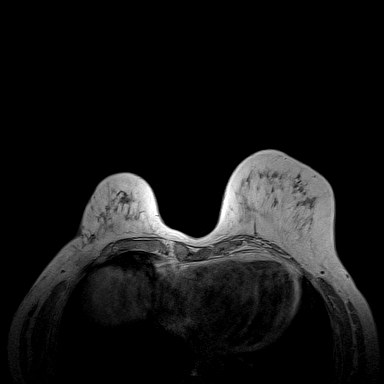
[im 72/144]
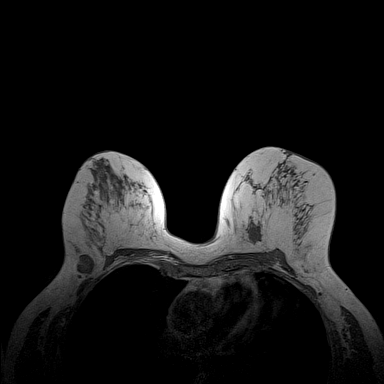
[im 108/144]
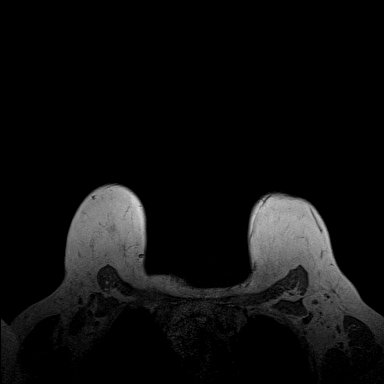
[im 144/144]
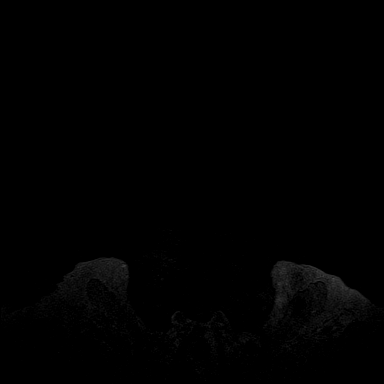

[Series 4: fl3d pre-cm · axial · non-contrast · 1.2mm · 0.94mm/px · z∈[-74,+98]mm · 5 of 144 slices shown]
[im 1/144]
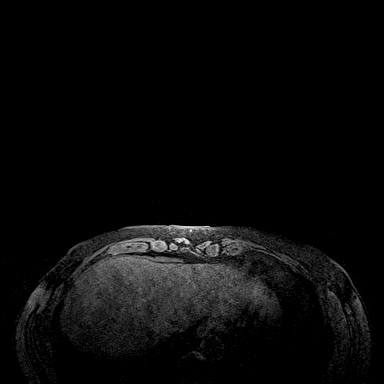
[im 36/144]
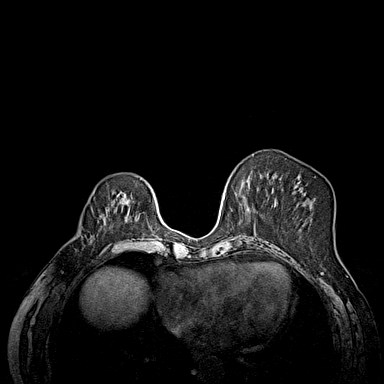
[im 72/144]
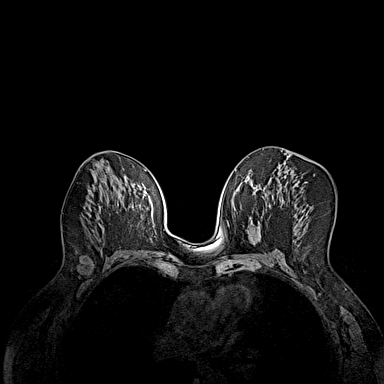
[im 108/144]
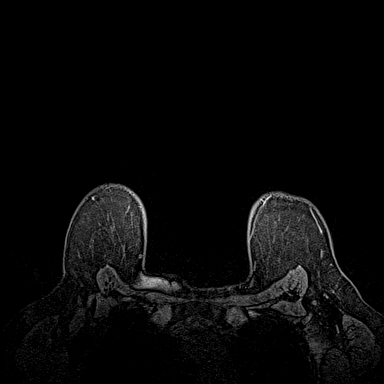
[im 144/144]
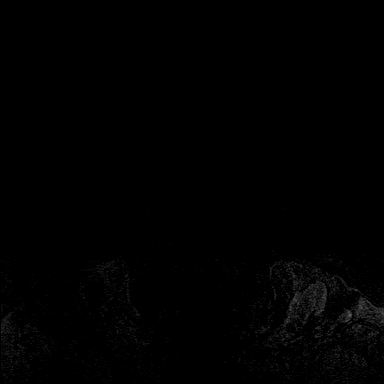

[Series 5: fl3d post-cm 20 · axial · 1.2mm · 0.94mm/px · z∈[-74,+98]mm · 5 of 144 slices shown (1 of 3)]
[im 1/144]
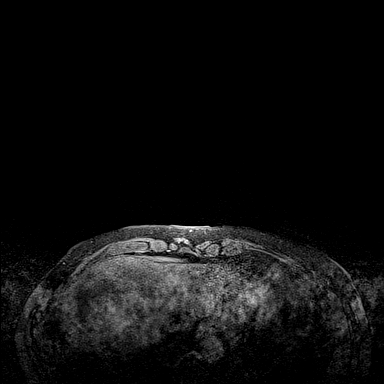
[im 36/144]
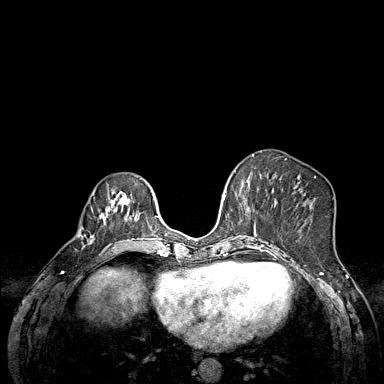
[im 72/144]
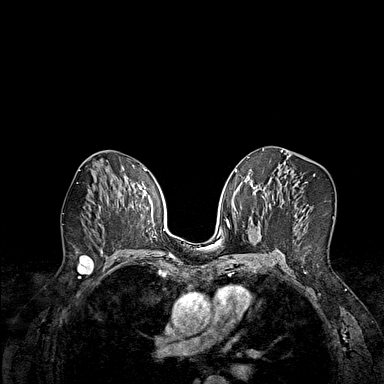
[im 108/144]
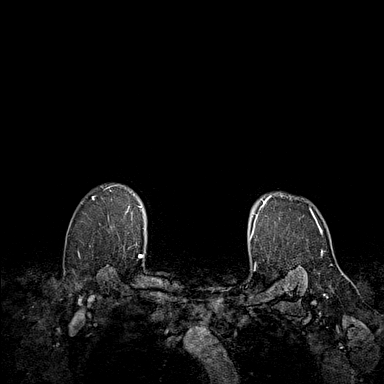
[im 144/144]
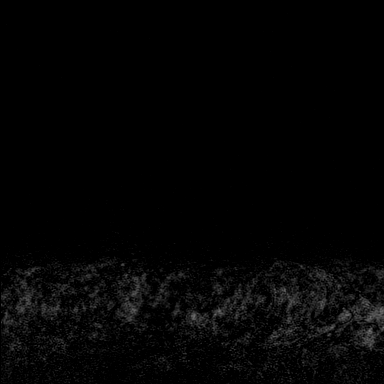

[Series 6: fl3d post-cm 20 · axial · 1.2mm · 0.94mm/px · z∈[-74,+98]mm · 5 of 144 slices shown (2 of 3)]
[im 1/144]
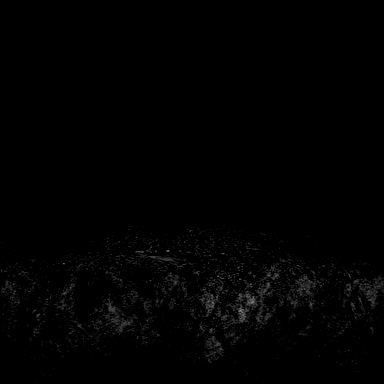
[im 36/144]
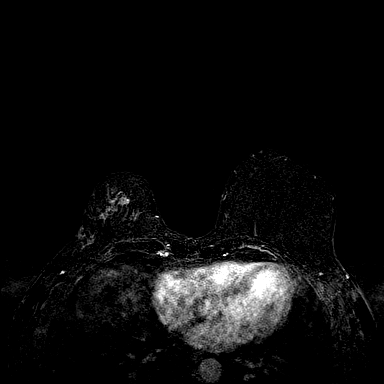
[im 72/144]
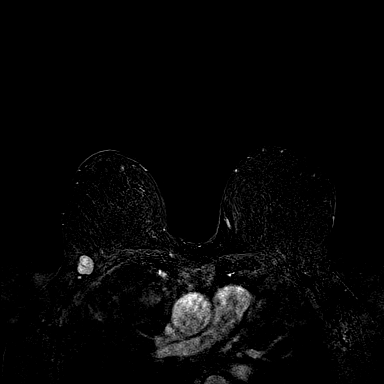
[im 108/144]
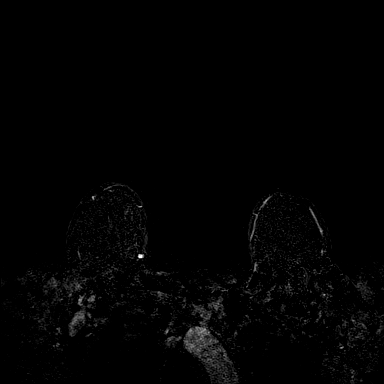
[im 144/144]
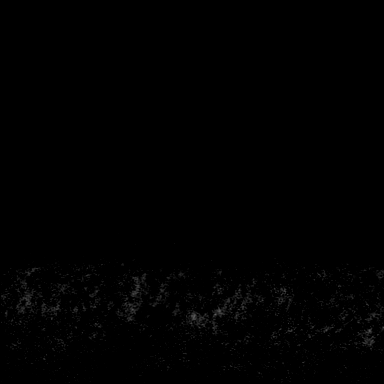

[Series 7: fl3d post-cm 20 · axial · 172.8mm · 0.94mm/px · 1 of 1 slices shown (3 of 3)]
[im 1/1]
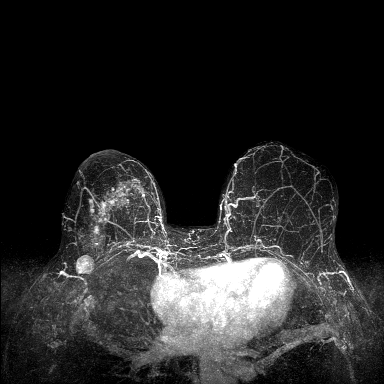

[Series 8: fl3d post-cm 3min · axial · 1.2mm · 0.94mm/px · z∈[-74,+98]mm · 5 of 144 slices shown]
[im 1/144]
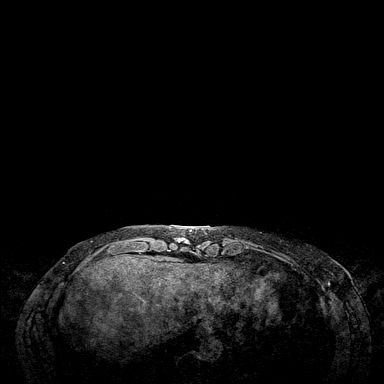
[im 36/144]
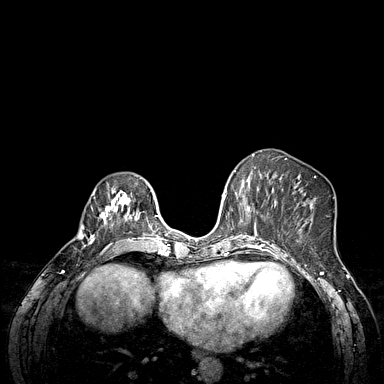
[im 72/144]
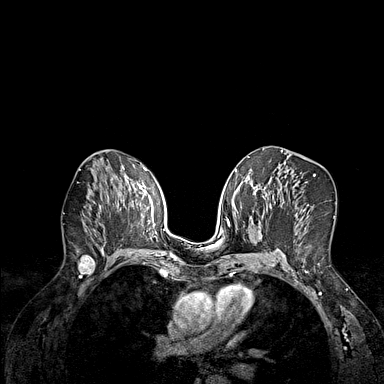
[im 108/144]
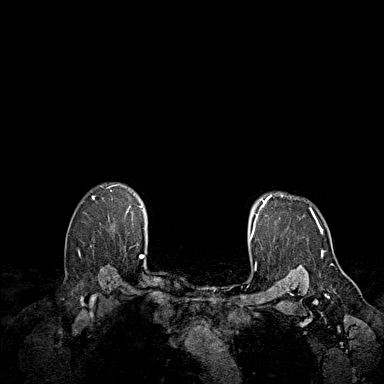
[im 144/144]
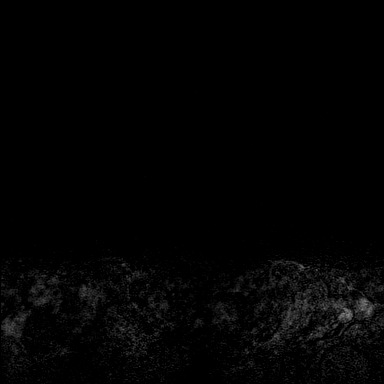

[Series 9: fl3d post-cm 3min_sub · axial · 1.2mm · 0.94mm/px · z∈[-74,-40]mm · 2 of 144 slices shown]
[im 1/144]
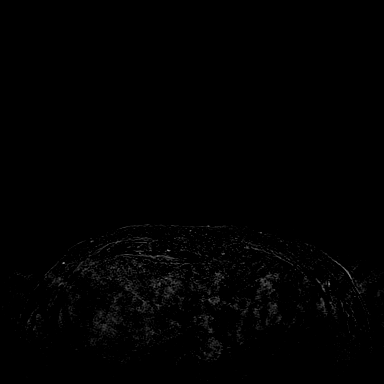
[im 29/144]
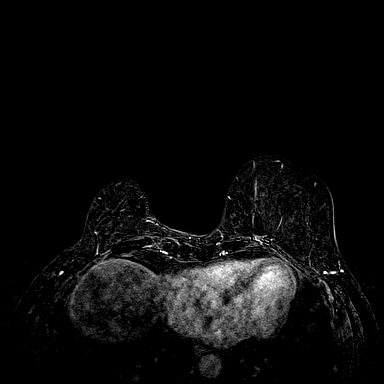

[29 of 48 positions shown; findings below may reference images not displayed]

Three-dimensional MR images were rendered by post-processing of the
original MR data on an independent workstation. The
three-dimensional MR images were interpreted, and findings are
reported in the following complete MRI report for this study. Three
dimensional images were evaluated at the independent [employer]
workstation
FINDINGS: Breast composition: c. Heterogeneous fibroglandular tissue.

Background parenchymal enhancement: Mild

Right breast: The right breast is smaller than the left breast and
there is extensive patchy, non mass enhancement extending from the
posterior to the anterior aspect of the lower outer quadrant of the
breast and across anteriorly into the lower inner quadrant. This
includes the biopsied invasive mammary carcinoma and mammary
carcinoma in the posterior aspect of the lower outer quadrant,
containing a post biopsy marker clip artifact. There is associated
distortion in the area of the biopsy marker clip the non mass
enhancement has a broad base against the underlying lateral aspect
of the pectoralis muscle with mild enhancement in the underlying
portion of the muscle. The patchy non mass enhancement spans an area
measuring 8.7 x 2.8 in the axial plane 2.8 cm in length in the
sagittal. This entire area has predominantly persistent enhancement
kinetics.

No mass or enhancement elsewhere in the right breast suspicious for
malignancy other than the previously biopsied intramammary node in
the axillary tail.

Left breast: No mass or abnormal enhancement.

Lymph nodes: The previously biopsied metastatic node in the axillary
tail of the right breast measures 1.7 x 1.5 cm on image number 76
series 4. This contains a biopsy marker clip artifact.

There is an enlarged level 1 right axillary node measuring 3.5 x
cm on image number 29 series 5, containing a probable tiny biopsy
marker clip artifact laterally. This compatible with the recently
biopsied metastatic node. There is also an adjacent right axillary
node measuring 1.2 x 0.4 cm on image number 38 series 5, most likely
representing the right axillary node with mild cortical thickening
on ultrasound.

There are 3 adjacent asymmetrically mildly enlarged lymph nodes in
the medial right infraclavicular region. The largest has a short
axis diameter of 8 mm on image number 20 series 5.

Ancillary findings:  None.
IMPRESSION: 1. Biopsy-proven malignancy in the posterior aspect of the lower
outer quadrant of the right breast with associated non mass
enhancement involving a large portion of the lower outer quadrant of
the breast and extending into the lower inner quadrant of the breast
anteriorly. This spans 8.7 x 2.8 x 2.8 with probable mild invasion
of the underlying lateral aspect of the pectoralis muscle.
2. Biopsy-proven metastatic node in the axillary tail of the right
breast.
3. Biopsy proven metastatic level 1 right axillary lymph node with
an adjacent lymph node mildly suspicious for a 2nd metastatic node.
4. Mild asymmetrical right infraclavicular adenopathy, suspicious
for level 3 axillary metastatic adenopathy.
5. No evidence of malignancy in the left breast.

RECOMMENDATION:
1. Treatment plan.
2. If breast conservation is desired on the right, an MR guided core
needle biopsy of the anterior extent of the non mass enhancement in
the lower inner quadrant of the right breast would be recommended.

BI-RADS CATEGORY  4: Suspicious.

## 2019-08-15 MED ORDER — GADOBUTROL 1 MMOL/ML IV SOLN
6.0000 mL | Freq: Once | INTRAVENOUS | Status: AC | PRN
  Administered 2019-08-15: 6 mL via INTRAVENOUS

## 2019-08-15 NOTE — Progress Notes (Signed)
HEMATOLOGY-ONCOLOGY MYCHART VIDEO VISIT PROGRESS NOTE  I connected with TYANNA Hunt on 08/16/2019 at  8:15 AM EDT by MyChart video conference and verified that I am speaking with the correct person using two identifiers.  I discussed the limitations, risks, security and privacy concerns of performing an evaluation and management service by MyChart and the availability of in person appointments.  I also discussed with the patient that there may be a patient responsible charge related to this service. The patient expressed understanding and agreed to proceed.  Patient's Location: Home Physician Location: Clinic  CHIEF COMPLIANT: Follow-up of right breast cancer to discuss treatment plan   INTERVAL HISTORY: [name] is a 64 y.o. female with above-mentioned history of right breast cancer currently on neoadjuvant antiestrogen therapy with anastrozole. Breast MRI on 08/15/19 showed the biopsy-proven mass in the lower outer right breast, 8.7cm, the biopsy-proven metastatic nodes in the right breast axillary tail and right axilla, mild right infraclavicular adenopathy suspicious for metastatic adenopathy, and no left breast malignancy. She presents over MyChart today to review the MRI and discuss further treatment.   Oncology History  Malignant neoplasm of lower-outer quadrant of right breast of female, estrogen receptor positive ([place])  08/01/2019 Initial Diagnosis   Palpable right breast mass for 1 month.  Mammogram revealed a 5 cm mass 8:30 position, 3 cm intramammary lymph node at 10 o'clock position and 2 enlarged right axillary lymph nodes.  Biopsy revealed grade 1 IDC ER 90%, PR 10%, Ki-67 10%, HER-2 negative.  Intramammary node and axillary lymph node both positive with similar prognostic profile   08/08/2019 -  Neo-Adjuvant Anti-estrogen oral therapy   Anastrozole while deciding treatment plan     Observations/Objective:  There were no vitals filed for this visit. There is no height  or weight on file to calculate BMI.  I have reviewed the data as listed CMP 06/12/2007  Glucose 111(H)  BUN 10  Creatinine 0.72  Sodium 137  Potassium 3.7  Chloride 105  CO2 26  Calcium 8.9    Lab Results  Component Value Date   WBC 7.8 06/12/2007   HGB 13.4 06/12/2007   HCT 38.0 06/12/2007   MCV 90.4 06/12/2007   PLT 314 06/12/2007   NEUTROABS 5.4 06/12/2007      Assessment Plan:  Malignant neoplasm of lower-outer quadrant of right breast of female, estrogen receptor positive ([place]) 08/01/2019:Palpable right breast mass for 1 month.  Mammogram revealed a 5 cm mass 8:30 position, 3 cm intramammary lymph node at 10 o'clock position and 2 enlarged right axillary lymph nodes.  Biopsy revealed grade 1 IDC ER 90%, PR 10%, Ki-67 10%, HER-2 negative.  Intramammary node and axillary lymph node both positive with similar prognostic profile  Breast MRI 08/15/2019: Biopsy-proven malignancy lower outer quadrant right breast, non-mass enhancement spanning 8.7 cm with probable invasion of underlying pectoral muscle.  Biopsy-proven metastatic lymph node in the right axillary tail.  Mild asymmetric right infraclavicular adenopathy.  Recommendation: Biopsy of non-mass enhancement  Treatment plan: 1.  MammaPrint testing: Pending 2.  Neoadjuvant chemotherapy versus neoadjuvant antiestrogen therapy 3.  Mastectomy and targeted node dissection 4.  Adjuvant radiation 5.  Followed by adjuvant antiestrogen therapy  Because of the infraclavicular lymph node involvement and her vague abdominal symptoms I would like to obtain CT of her chest abdomen pelvis for staging purposes. We will also request biopsy of the non-mass enhancement.  Current treatment: Neoadjuvant antiestrogen therapy with anastrozole 1 mg daily started 08/08/2019 Anastrozole toxicities: Denies  any side effects to anastrozole therapy.  Return to clinic after MammaPrint testing to discuss the role of chemotherapy.  I discussed the  assessment and treatment plan with the patient. The patient was provided an opportunity to ask questions and all were answered. The patient agreed with the plan and demonstrated an understanding of the instructions. The patient was advised to call back or seek an in-person evaluation if the symptoms worsen or if the condition fails to improve as anticipated.   I provided 30 minutes of face-to-face MyChart video visit time, the precharting charting and coordination of care during this encounter.    Katelyn Eisenmenger, MD 08/16/2019   I, [name], am acting as scribe for [name], MD.  I have reviewed the above documentation for accuracy and completeness, and I agree with the above.

## 2019-08-16 ENCOUNTER — Telehealth: Payer: Self-pay | Admitting: *Deleted

## 2019-08-16 ENCOUNTER — Other Ambulatory Visit: Payer: Self-pay | Admitting: Hematology and Oncology

## 2019-08-16 ENCOUNTER — Other Ambulatory Visit: Payer: Self-pay | Admitting: *Deleted

## 2019-08-16 ENCOUNTER — Inpatient Hospital Stay: Payer: Commercial Managed Care - PPO | Attending: Hematology and Oncology | Admitting: Hematology and Oncology

## 2019-08-16 DIAGNOSIS — C50511 Malignant neoplasm of lower-outer quadrant of right female breast: Secondary | ICD-10-CM

## 2019-08-16 DIAGNOSIS — C778 Secondary and unspecified malignant neoplasm of lymph nodes of multiple regions: Secondary | ICD-10-CM | POA: Insufficient documentation

## 2019-08-16 DIAGNOSIS — Z170 Estrogen receptor positive status [ER+]: Secondary | ICD-10-CM

## 2019-08-16 DIAGNOSIS — M899 Disorder of bone, unspecified: Secondary | ICD-10-CM | POA: Insufficient documentation

## 2019-08-16 NOTE — Telephone Encounter (Signed)
Received mammaprint results of low risk.  Patient is aware.  Team notified.

## 2019-08-16 NOTE — Assessment & Plan Note (Signed)
08/01/2019:Palpable right breast mass for 1 month.  Mammogram revealed a 5 cm mass 8:30 position, 3 cm intramammary lymph node at 10 o'clock position and 2 enlarged right axillary lymph nodes.  Biopsy revealed grade 1 IDC ER 90%, PR 10%, Ki-67 10%, HER-2 negative.  Intramammary node and axillary lymph node both positive with similar prognostic profile  Breast MRI 08/15/2019: Biopsy-proven malignancy lower outer quadrant right breast, non-mass enhancement spanning 8.7 cm with probable invasion of underlying pectoral muscle.  Biopsy-proven metastatic lymph node in the right axillary tail.  Mild asymmetric right infraclavicular adenopathy.  Recommendation: Biopsy of non-mass enhancement  Treatment plan: 1.  MammaPrint testing: Pending 2.  Neoadjuvant chemotherapy versus neoadjuvant antiestrogen therapy 3.  Mastectomy and targeted node dissection 4.  Adjuvant radiation 5.  Followed by adjuvant antiestrogen therapy  Current treatment: Neoadjuvant antiestrogen therapy with anastrozole 1 mg daily started 08/08/2019 Anastrozole toxicities:  Return to clinic after MammaPrint testing to discuss the role of chemotherapy.

## 2019-08-17 ENCOUNTER — Encounter: Payer: Self-pay | Admitting: *Deleted

## 2019-08-22 ENCOUNTER — Other Ambulatory Visit: Payer: Self-pay | Admitting: *Deleted

## 2019-08-22 DIAGNOSIS — C50511 Malignant neoplasm of lower-outer quadrant of right female breast: Secondary | ICD-10-CM

## 2019-08-22 DIAGNOSIS — Z170 Estrogen receptor positive status [ER+]: Secondary | ICD-10-CM

## 2019-08-23 ENCOUNTER — Inpatient Hospital Stay: Payer: Commercial Managed Care - PPO

## 2019-08-23 ENCOUNTER — Ambulatory Visit
Admission: RE | Admit: 2019-08-23 | Discharge: 2019-08-23 | Disposition: A | Discharge status: Home / Self Care | Payer: Commercial Managed Care - PPO | Source: Ambulatory Visit | Attending: Hematology and Oncology | Admitting: Hematology and Oncology

## 2019-08-23 ENCOUNTER — Other Ambulatory Visit: Payer: Self-pay

## 2019-08-23 DIAGNOSIS — C50511 Malignant neoplasm of lower-outer quadrant of right female breast: Secondary | ICD-10-CM | POA: Diagnosis present

## 2019-08-23 DIAGNOSIS — M899 Disorder of bone, unspecified: Secondary | ICD-10-CM | POA: Diagnosis not present

## 2019-08-23 DIAGNOSIS — C778 Secondary and unspecified malignant neoplasm of lymph nodes of multiple regions: Secondary | ICD-10-CM | POA: Diagnosis not present

## 2019-08-23 DIAGNOSIS — Z170 Estrogen receptor positive status [ER+]: Secondary | ICD-10-CM | POA: Diagnosis not present

## 2019-08-23 LAB — CMP (CANCER CENTER ONLY)
ALT: 16 U/L (ref 0–44)
AST: 16 U/L (ref 15–41)
Albumin: 4.5 g/dL (ref 3.5–5.0)
Alkaline Phosphatase: 66 U/L (ref 38–126)
Anion gap: 11 (ref 5–15)
BUN: 13 mg/dL (ref 8–23)
CO2: 27 mmol/L (ref 22–32)
Calcium: 9.9 mg/dL (ref 8.9–10.3)
Chloride: 105 mmol/L (ref 98–111)
Creatinine: 0.83 mg/dL (ref 0.44–1.00)
GFR, Est AFR Am: >60 mL/min (ref >60)
GFR, Estimated: >60 mL/min (ref >60)
Glucose, Bld: 122 mg/dL — ABNORMAL HIGH (ref 70–99)
Potassium: 4.4 mmol/L (ref 3.5–5.1)
Sodium: 143 mmol/L (ref 135–145)
Total Bilirubin: 0.5 mg/dL (ref 0.3–1.2)
Total Protein: 7.9 g/dL (ref 6.5–8.1)

## 2019-08-23 IMAGING — MR MR BREAST BX W/ LOC DEV 1ST LEASION IMAGE BX SPEC MR GUIDE*R*
7 of 10 series · 32 of 48 positions shown · IV contrast (6 ml gadavist)
Comparison: Previous exams.
COMPARISON: Previous exams.

Addendum:
CLINICAL DATA: 63-year-old female with newly diagnosed RIGHT breast
cancer and axillary lymph node metastases. For tissue sampling of
anterior aspect of abnormal masslike enhancement extending
throughout the LOWER RIGHT breast.

EXAM:
MRI GUIDED CORE NEEDLE BIOPSY OF THE RIGHT BREAST
TECHNIQUE: Multiplanar, multisequence MR imaging of the RIGHT breast was
performed both before and after administration of intravenous
contrast.
CONTRAST:  6mL GADAVIST GADOBUTROL 1 MMOL/ML IV SOLN

[Series 2: fiducial unilateral · sagittal · 2.0mm · 1.33mm/px · 3 of 52 slices shown]
[im 1/52]
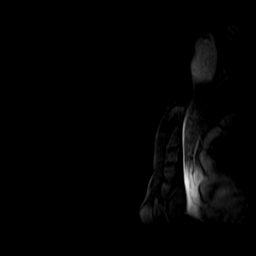
[im 26/52]
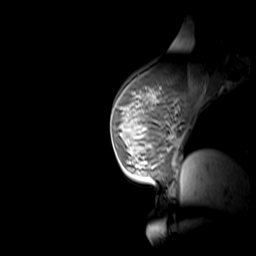
[im 52/52]
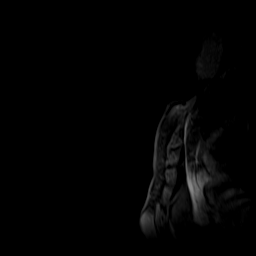

[Series 3: dynamic pre · axial · non-contrast · 1.3mm · 0.73mm/px · z∈[-58,+128]mm · 5 of 144 slices shown]
[im 1/144]
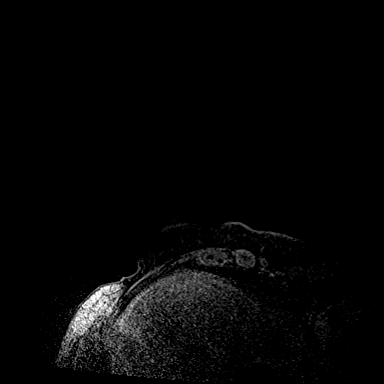
[im 36/144]
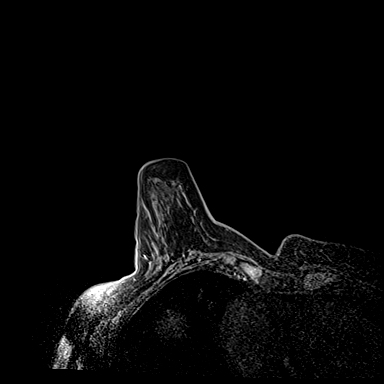
[im 72/144]
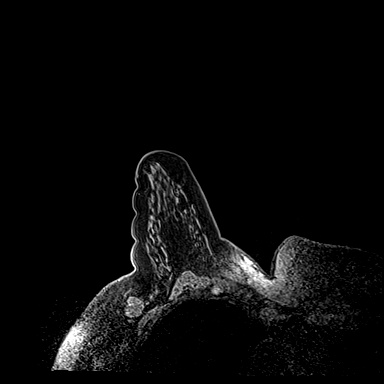
[im 108/144]
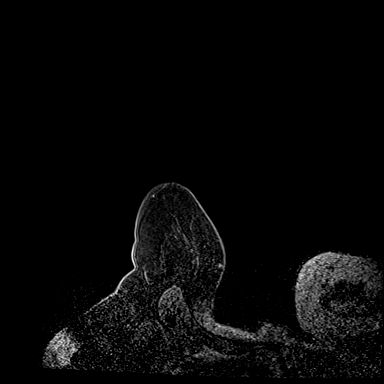
[im 144/144]
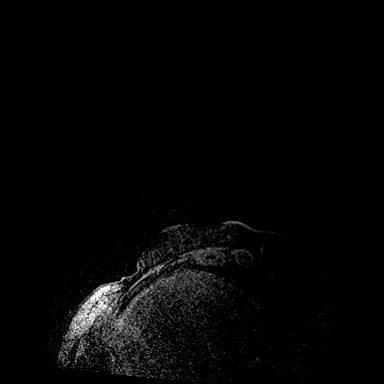

[Series 4: dynamic post 20 · axial · 1.3mm · 0.73mm/px · z∈[-58,+128]mm · 5 of 144 slices shown (1 of 2)]
[im 1/144]
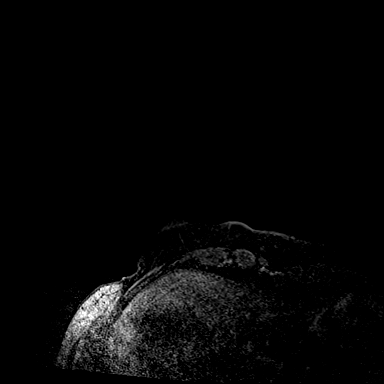
[im 36/144]
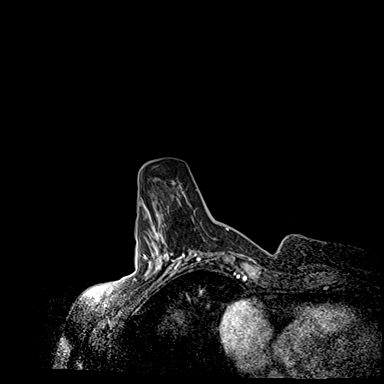
[im 72/144]
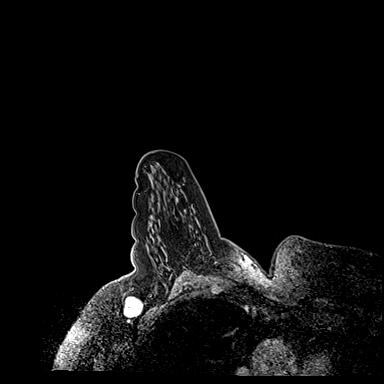
[im 108/144]
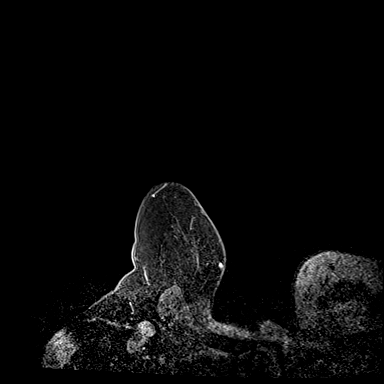
[im 144/144]
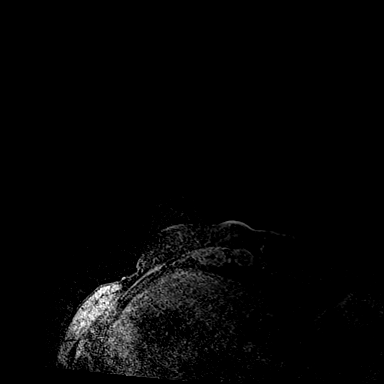

[Series 5: dynamic post 20 · axial · 1.3mm · 0.73mm/px · z∈[-58,+128]mm · 5 of 144 slices shown (2 of 2)]
[im 1/144]
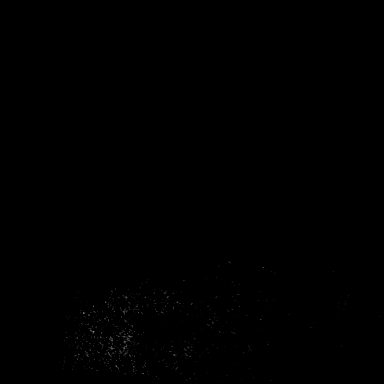
[im 36/144]
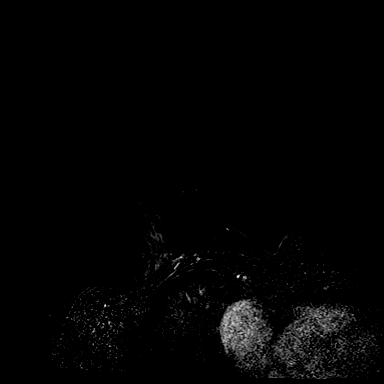
[im 72/144]
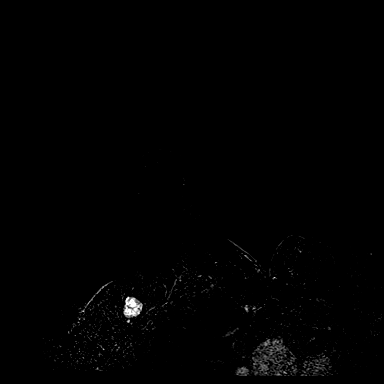
[im 108/144]
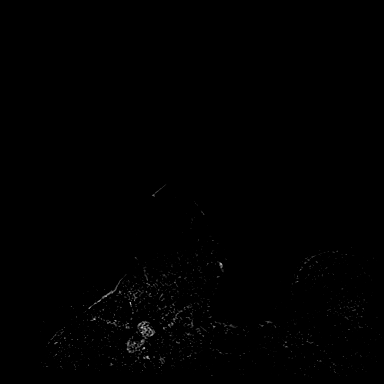
[im 144/144]
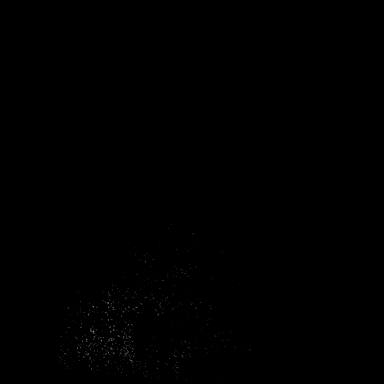

[Series 6: dynamic post 3 · axial · 1.3mm · 0.73mm/px · z∈[-58,+128]mm · 5 of 144 slices shown (1 of 2)]
[im 1/144]
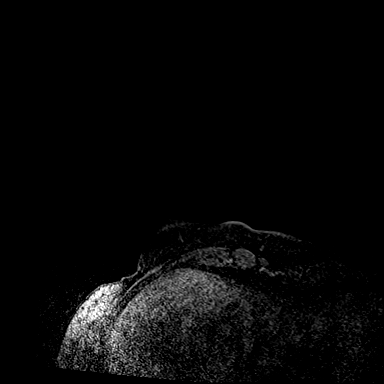
[im 36/144]
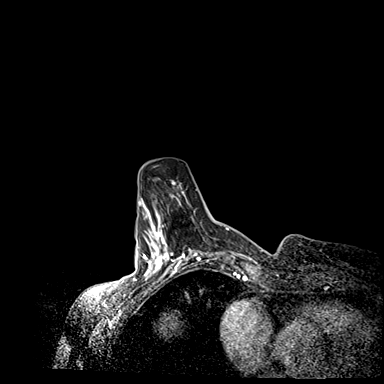
[im 72/144]
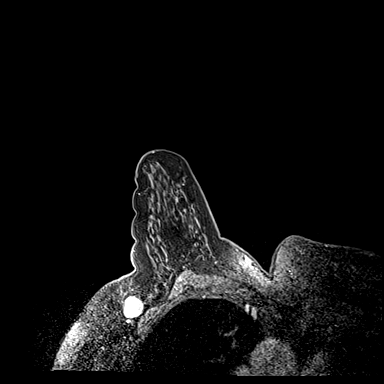
[im 108/144]
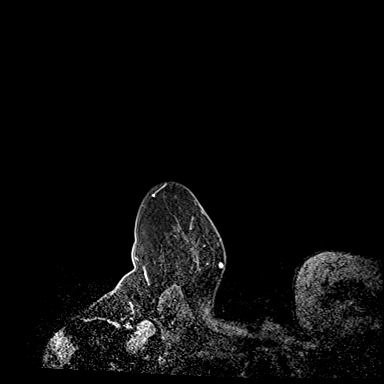
[im 144/144]
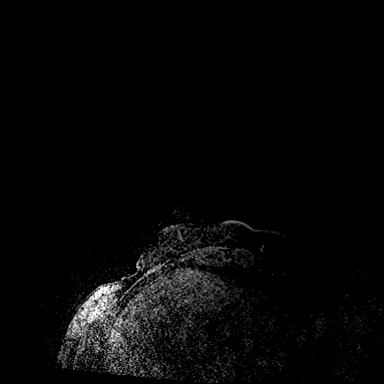

[Series 7: dynamic post 3 · axial · 1.3mm · 0.73mm/px · z∈[-58,+128]mm · 5 of 144 slices shown (2 of 2)]
[im 1/144]
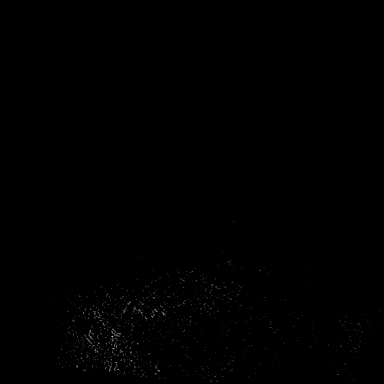
[im 36/144]
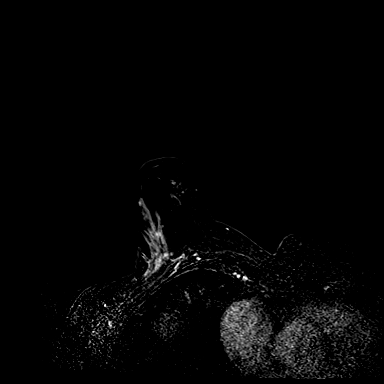
[im 72/144]
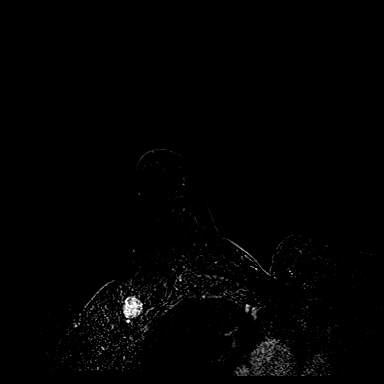
[im 108/144]
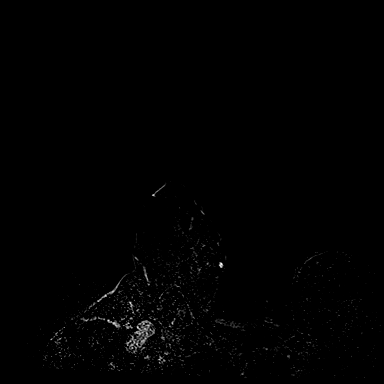
[im 144/144]
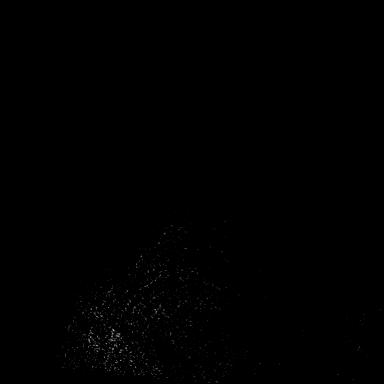

[Series 8: needle confirmation · axial · 1.3mm · 0.73mm/px · z∈[-58,+81]mm · 4 of 144 slices shown]
[im 1/144]
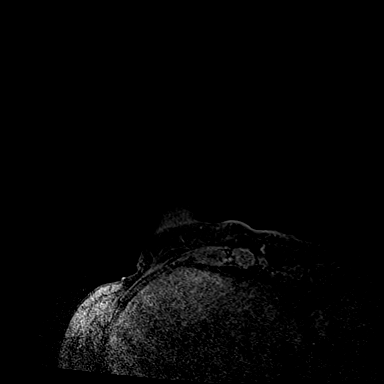
[im 36/144]
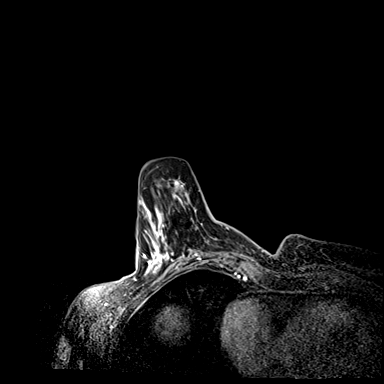
[im 72/144]
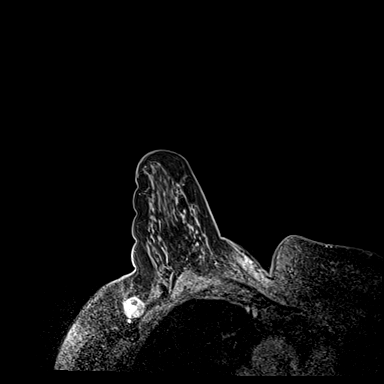
[im 108/144]
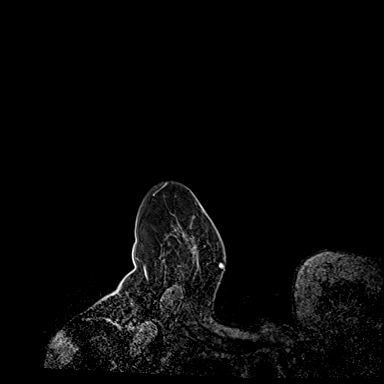

[32 of 48 positions shown; findings below may reference images not displayed]

FINDINGS: I met with the patient, and we discussed the procedure of MRI guided
biopsy, including risks, benefits, and alternatives. Specifically,
we discussed the risks of infection, bleeding, tissue injury, clip
migration, and inadequate sampling. Informed, written consent was
given. The usual time out protocol was performed immediately prior
to the procedure.

Using sterile technique, 1% Lidocaine, MRI guidance, and a 9 gauge
vacuum assisted device, biopsy was performed of the anterior LOWER
aspect of non masslike enhancement within the LOWER RIGHT breast
using a LATERAL approach. At the conclusion of the procedure, a
BARBELL tissue marker clip was assumed to be deployed into the
biopsy cavity. However subsequent 2-view mammogram demonstrates that
the clip did not deploy (dictated in a separate report).
IMPRESSION: MRI guided biopsy of anterior LOWER aspect of non masslike
enhancement within the LOWER RIGHT breast.

Please note that the BARBELL tissue marker clip did not deploy as
demonstrated on subsequent two view mammogram.

ADDENDUM:
Pathology revealed GRADE I INVASIVE MAMMARY CARCINOMA of the RIGHT
breast, anterior lower. This was found to be concordant by Dr. JALVER
JALVER.

Non deployment of the biopsy clip following MR guided biopsy of the
RIGHT breast. If localization of this area is absolutely required
for surgery, clip placement or localization under mammographic
guidance can be performed at a later date.

Pathology results were discussed with the patient by telephone. The
patient reported doing well after the biopsy with tenderness at the
site. Post biopsy instructions and care were reviewed and questions
were answered. The patient was encouraged to call The [REDACTED]

The patient has a recent diagnosis of right breast cancer and should
follow her outlined treatment plan.

Pathology results reported by JALVER RN on [DATE].

*** End of Addendum ***
FINDINGS: I met with the patient, and we discussed the procedure of MRI guided
biopsy, including risks, benefits, and alternatives. Specifically,
we discussed the risks of infection, bleeding, tissue injury, clip
migration, and inadequate sampling. Informed, written consent was
given. The usual time out protocol was performed immediately prior
to the procedure.

Using sterile technique, 1% Lidocaine, MRI guidance, and a 9 gauge
vacuum assisted device, biopsy was performed of the anterior LOWER
aspect of non masslike enhancement within the LOWER RIGHT breast
using a LATERAL approach. At the conclusion of the procedure, a
BARBELL tissue marker clip was assumed to be deployed into the
biopsy cavity. However subsequent 2-view mammogram demonstrates that
the clip did not deploy (dictated in a separate report).
IMPRESSION: MRI guided biopsy of anterior LOWER aspect of non masslike
enhancement within the LOWER RIGHT breast.

Please note that the BARBELL tissue marker clip did not deploy as
demonstrated on subsequent two view mammogram.

## 2019-08-23 IMAGING — MG MM BREAST LOCALIZATION CLIP
4 series · 4 of 12 positions shown · non-contrast
Comparison: Previous exam(s).

CLINICAL DATA: Evaluate biopsy clip placement following MR guided
RIGHT breast biopsy.

EXAM:
DIAGNOSTIC RIGHT MAMMOGRAM POST MRI BIOPSY

[R ML synth-2D]
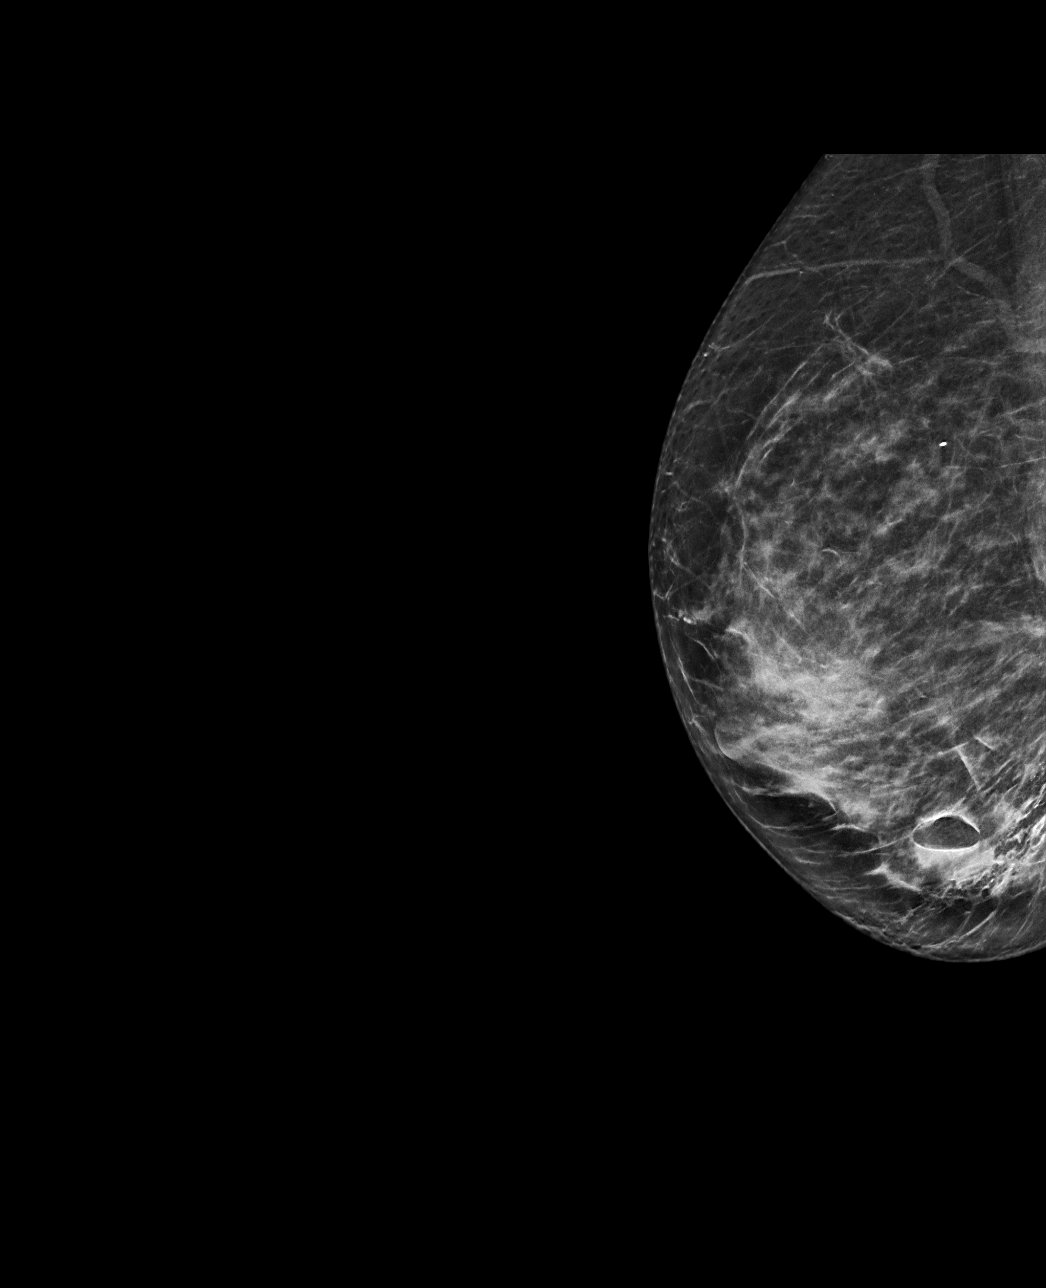

[R CC synth-2D]
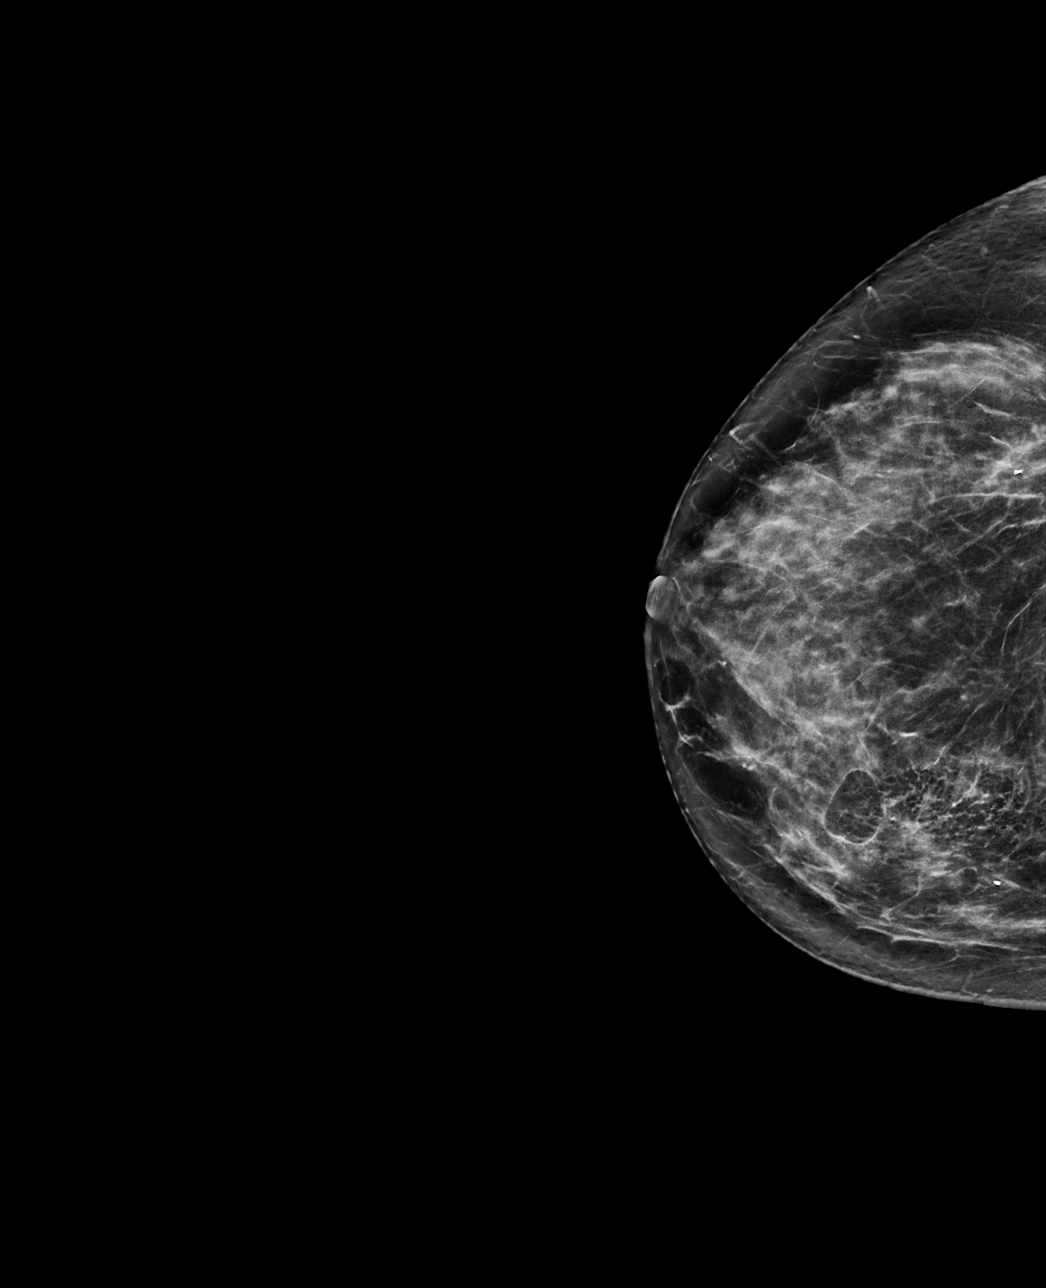

[R CC tomo · tomo slice 35/70.0]
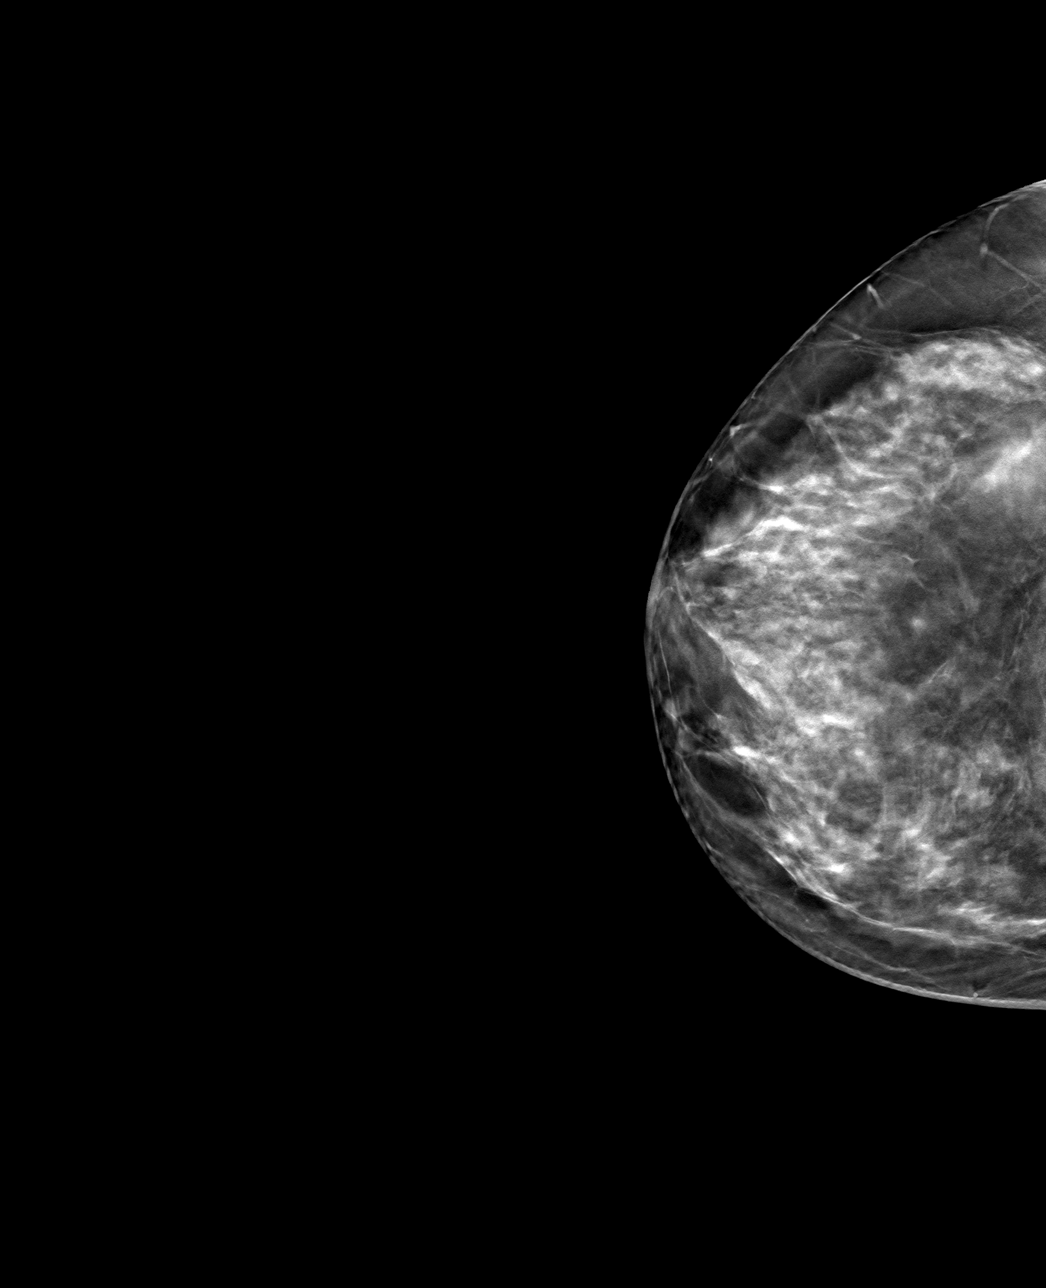

[R ML tomo · tomo slice 34/67.0]
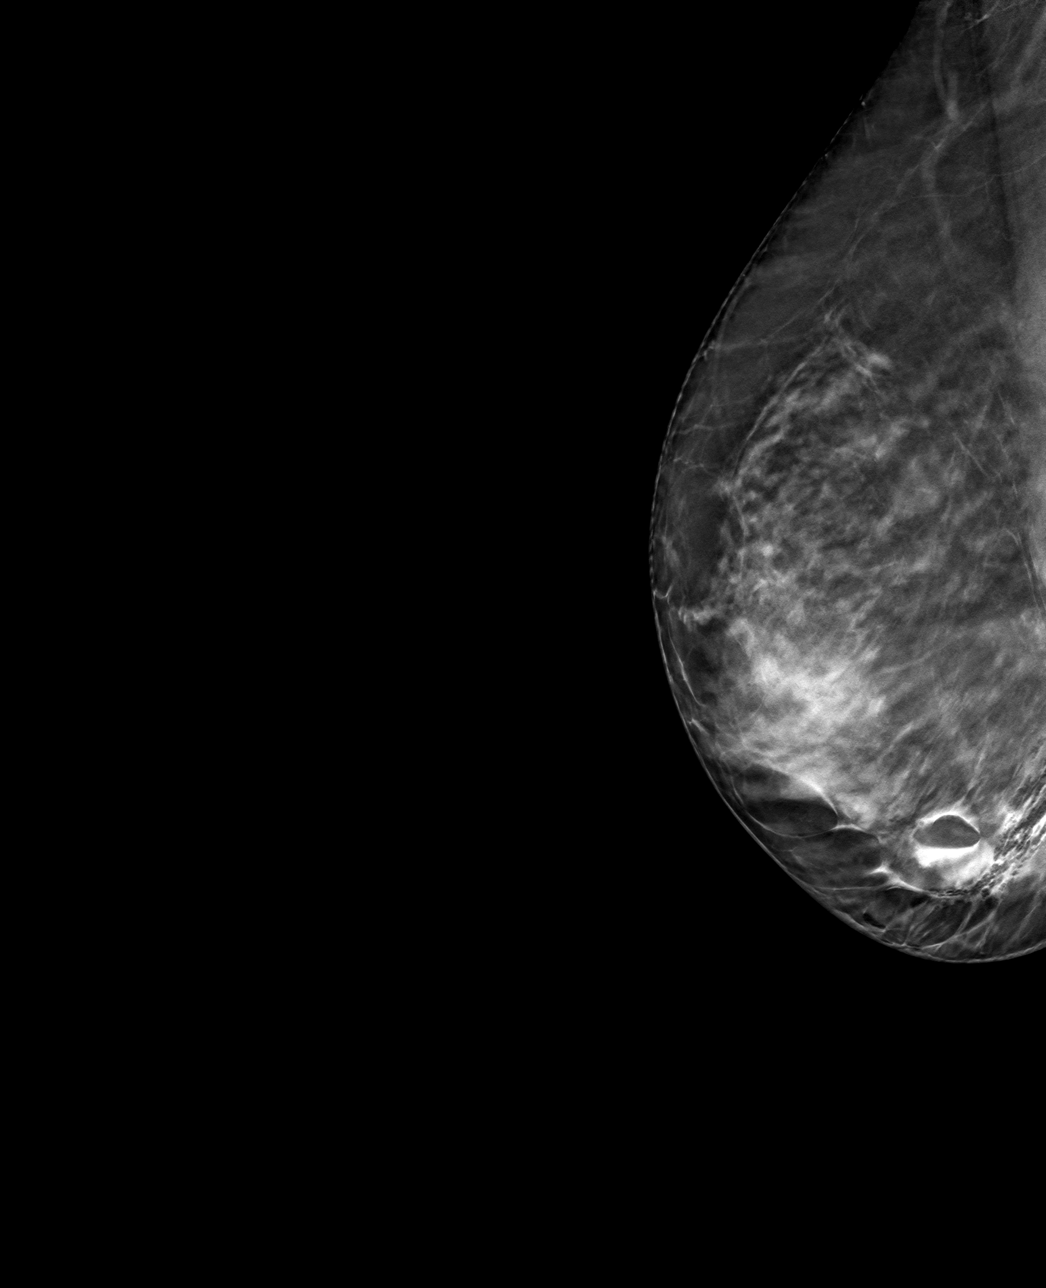

[4 of 12 positions shown; findings below may reference images not displayed]

FINDINGS: Mammographic images were obtained following MR guided biopsy of
anterior aspect of LOWER RIGHT non masslike enhancement.

No biopsy marking clip identified at the site of biopsy within the
INNER LOWER RIGHT breast, at the site of the MR biopsy cavity.
IMPRESSION: Non deployment of the biopsy clip following MR guided biopsy of the
RIGHT breast. If the biopsy is positive for malignancy, biopsy clip
presence is likely not absolutely necessary given widespread area of
the MR abnormality and probable mastectomy. If localization of this
area is required for surgery, clip placement or localization under
mammographic guidance can be performed at a later date.

Final Assessment: Post Procedure Mammograms for Marker Placement

## 2019-08-23 MED ORDER — GADOBUTROL 1 MMOL/ML IV SOLN
6.0000 mL | Freq: Once | INTRAVENOUS | Status: AC | PRN
  Administered 2019-08-23: 6 mL via INTRAVENOUS

## 2019-08-24 ENCOUNTER — Ambulatory Visit (HOSPITAL_COMMUNITY)
Admission: RE | Admit: 2019-08-24 | Discharge: 2019-08-24 | Disposition: A | Discharge status: Home / Self Care | Payer: Commercial Managed Care - PPO | Source: Ambulatory Visit | Attending: Hematology and Oncology | Admitting: Hematology and Oncology

## 2019-08-24 ENCOUNTER — Encounter: Payer: Self-pay | Admitting: *Deleted

## 2019-08-24 ENCOUNTER — Encounter (HOSPITAL_COMMUNITY): Payer: Self-pay

## 2019-08-24 DIAGNOSIS — C50511 Malignant neoplasm of lower-outer quadrant of right female breast: Secondary | ICD-10-CM | POA: Diagnosis present

## 2019-08-24 DIAGNOSIS — Z170 Estrogen receptor positive status [ER+]: Secondary | ICD-10-CM | POA: Insufficient documentation

## 2019-08-24 IMAGING — CT CT ABD-PELV W/ CM
2 of 5 series · 12 of 36 positions shown, 15 images · IV contrast (OMNIPAQUE)
Comparison: [DATE] chest CT.

CLINICAL DATA: New diagnosis of right breast cancer. Antiestrogen
therapy in progress. Staging evaluation.

EXAM:
CT CHEST, ABDOMEN, AND PELVIS WITH CONTRAST
TECHNIQUE: Multidetector CT imaging of the chest, abdomen and pelvis was
performed following the standard protocol during bolus
administration of intravenous contrast.
CONTRAST:  100mL OMNIPAQUE IOHEXOL 300 MG/ML  SOLN

[Series 2: cap with · axial · 0.71mm/px · z∈[-563,-68]mm · 9 of 125 slices shown, 12 images]
[im 13/125  mediastinal]
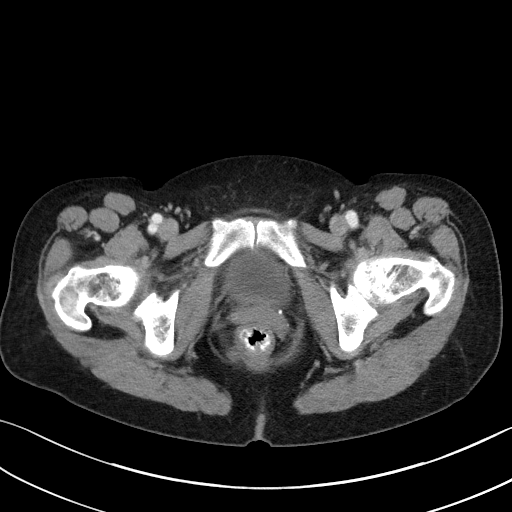
[im 13/125  lung]
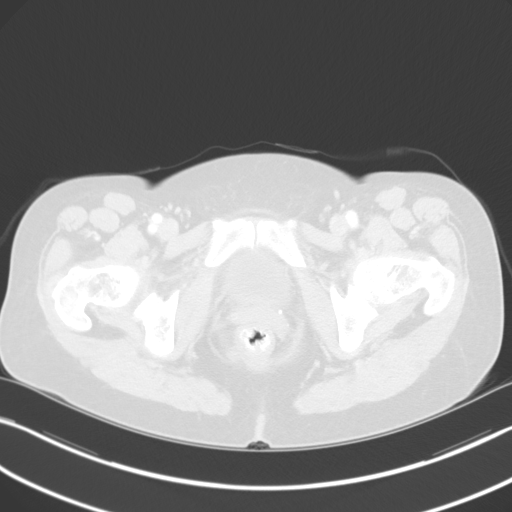
[im 25/125  lung]
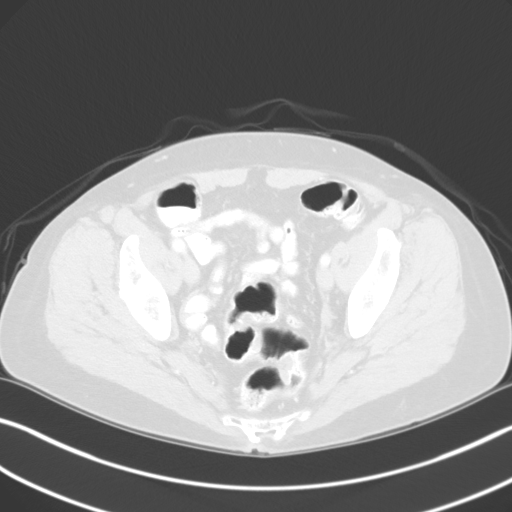
[im 38/125  lung]
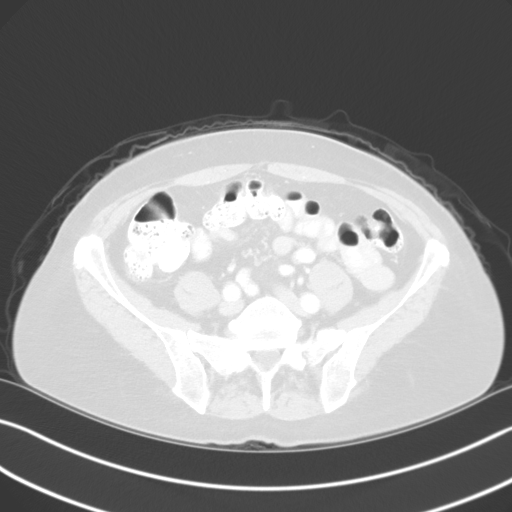
[im 50/125  lung]
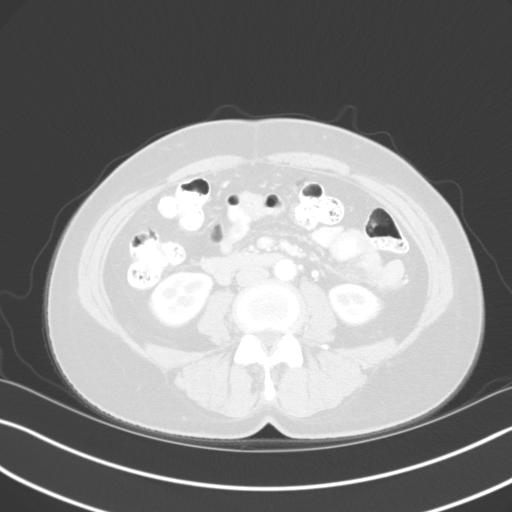
[im 63/125  mediastinal]
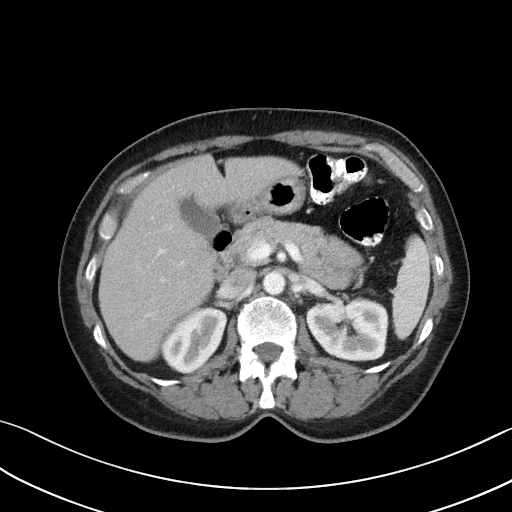
[im 63/125  lung]
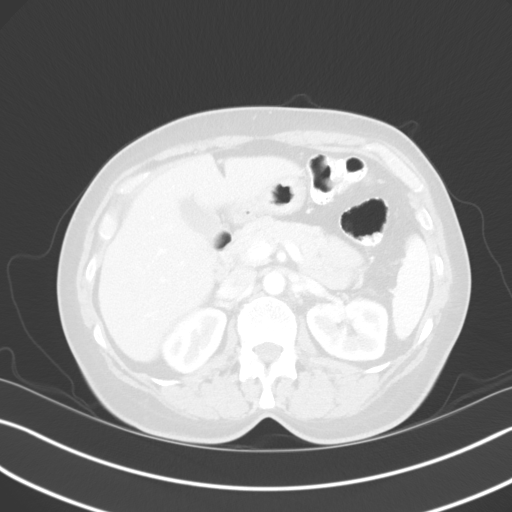
[im 75/125  lung]
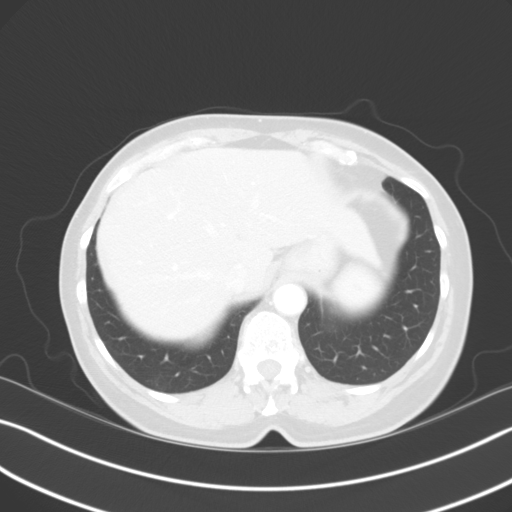
[im 87/125  lung]
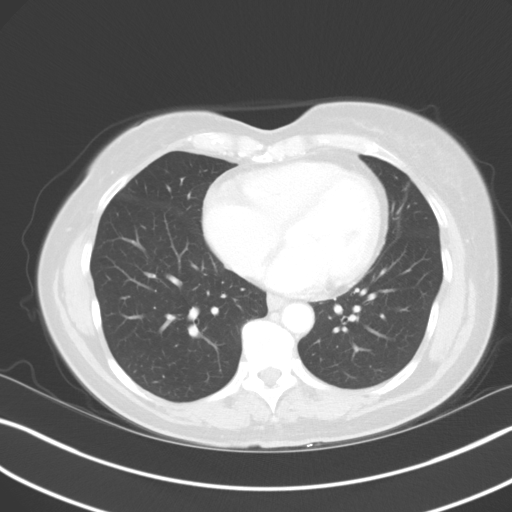
[im 100/125  lung]
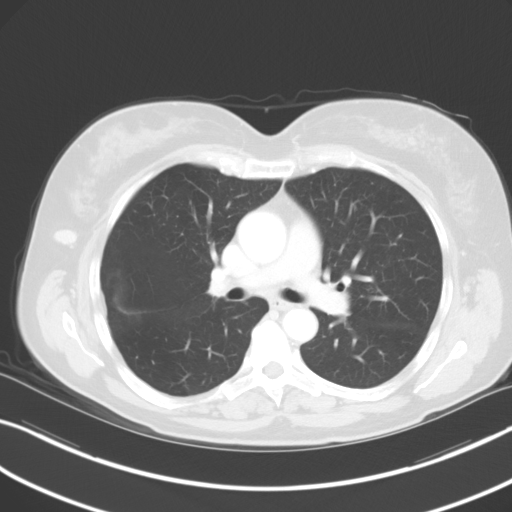
[im 112/125  mediastinal]
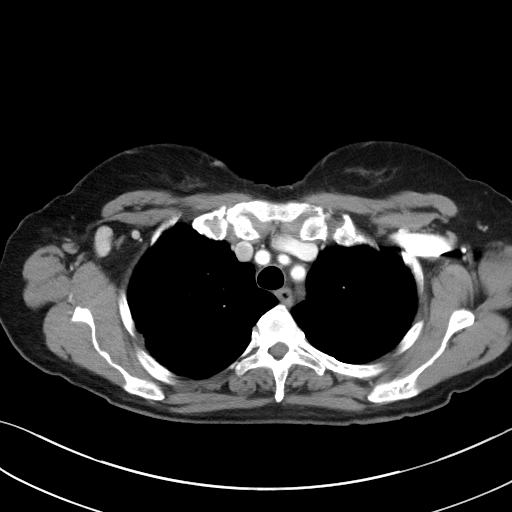
[im 112/125  lung]
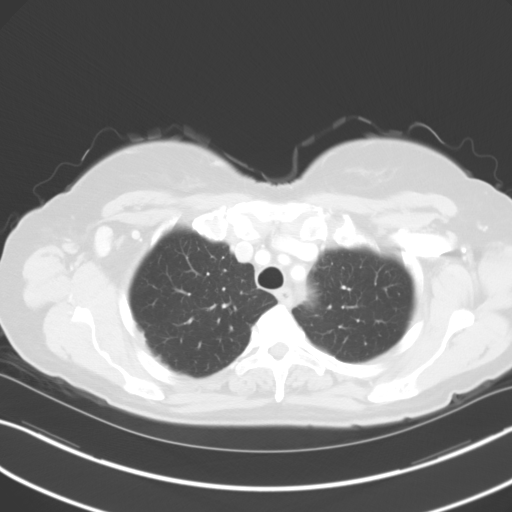

[Series 4: coronals · coronal · 0.76mm/px · 3 of 138 slices shown]
[im 28/138  lung]
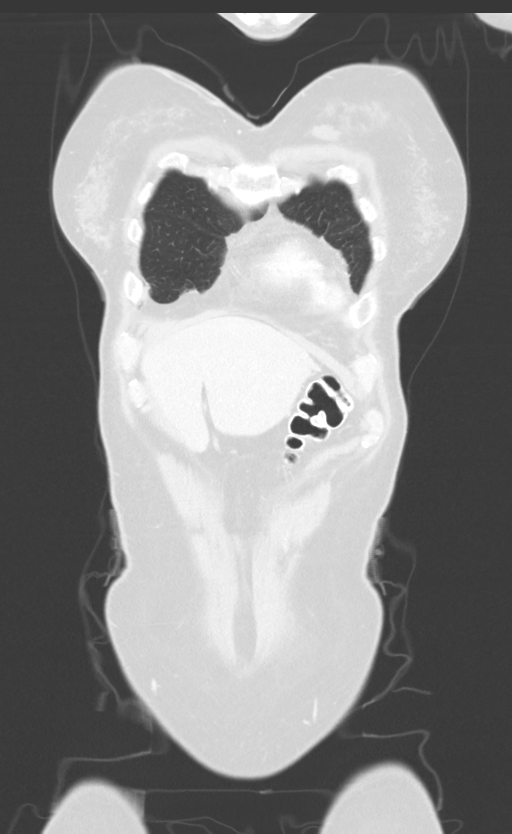
[im 55/138  lung]
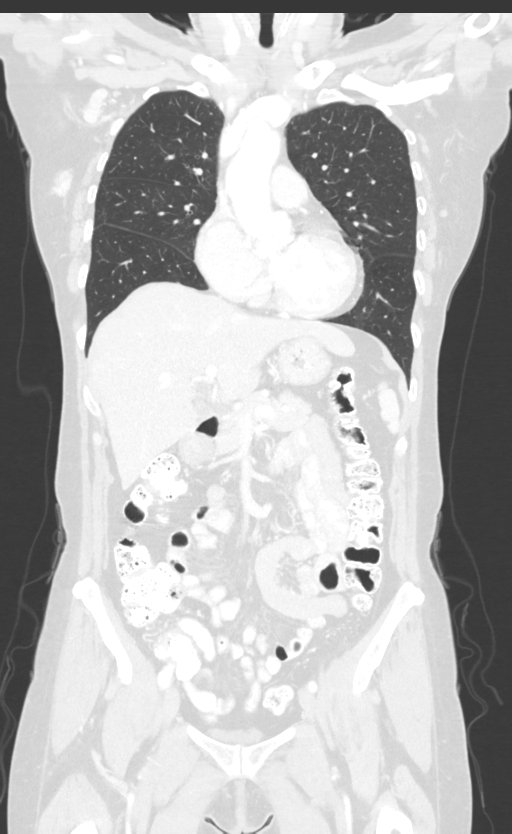
[im 83/138  lung]
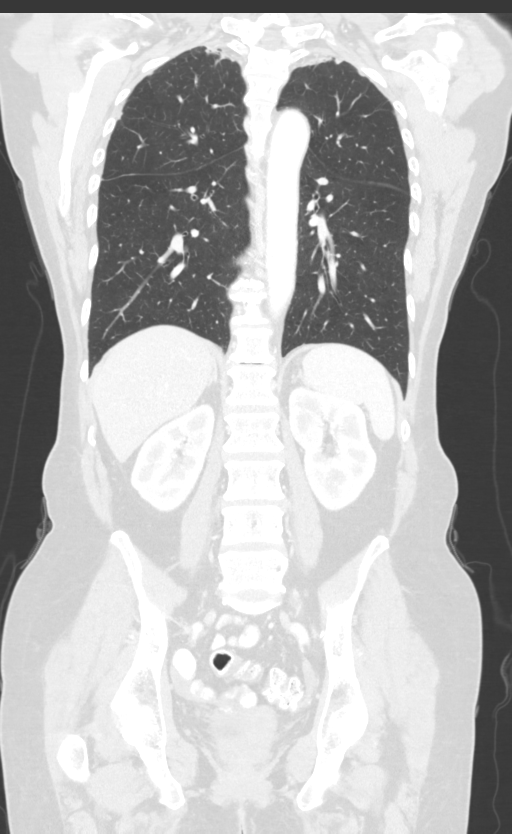

[12 of 36 positions shown; findings below may reference images not displayed]

FINDINGS: CT CHEST FINDINGS

Cardiovascular: Normal heart size. No significant pericardial
effusion/thickening. Mildly atherosclerotic nonaneurysmal thoracic
aorta. Normal caliber pulmonary arteries. No central pulmonary
emboli.

Mediastinum/Nodes: Apparent right hemithyroidectomy. No discrete
left thyroid nodules. Unremarkable esophagus. Mildly enlarged 1.0 cm
short axis diameter right axillary node containing a biopsy clip
(series 2/image 27). Additional enlarged 1.2 cm short axis diameter
right axillary node (series 2/image 16). Two borderline enlarged
round right retropectoral nodes, largest 0.9 cm short axis diameter
(series 2/image 11). No left axillary adenopathy. No pathologically
enlarged mediastinal or hilar nodes.

Lungs/Pleura: No pneumothorax. No pleural effusion. No acute
consolidative airspace disease, lung masses or significant pulmonary
nodules.

Musculoskeletal: No aggressive appearing focal osseous lesions. Mild
lower thoracic spondylosis. Scattered soft tissue gas in the inner
right breast from MR guided biopsy performed 1 day prior. Surgical
clip noted in the outer right breast.

CT ABDOMEN PELVIS FINDINGS

Hepatobiliary: Normal liver with no liver mass. Normal gallbladder
with no radiopaque cholelithiasis. No biliary ductal dilatation.

Pancreas: Normal, with no mass or duct dilation.

Spleen: Normal size spleen. Cystic 1.2 cm anterior splenic lesion,
compatible with a benign lesion such as a lymphangioma. No
additional splenic lesions.

Adrenals/Urinary Tract: Normal adrenals. Normal kidneys with no
hydronephrosis and no renal mass. Normal bladder.

Stomach/Bowel: Normal non-distended stomach. Normal caliber small
bowel with no small bowel wall thickening. Normal appendix. Oral
contrast transits to the rectum. Normal large bowel with no
diverticulosis, large bowel wall thickening or pericolonic fat
stranding.

Vascular/Lymphatic: Atherosclerotic nonaneurysmal abdominal aorta.
Patent portal, splenic, hepatic and renal veins. No pathologically
enlarged lymph nodes in the abdomen or pelvis.

Reproductive: Grossly normal uterus.  No adnexal mass.

Other: No pneumoperitoneum, ascites or focal fluid collection.

Musculoskeletal: Indistinct sclerotic 1.2 cm anterior left
acetabular lesion (series 2/image 110). No additional focal osseous
lesions.
IMPRESSION: 1. Right axillary and right retropectoral lymphadenopathy compatible
with nodal metastases.
2. Indistinct sclerotic 1.2 cm anterior left acetabular lesion,
indeterminate for osseous metastasis. Correlation with bone scan or
PET-CT may be obtained as clinically warranted.
3. No additional potential sites of metastatic disease in the chest,
abdomen or pelvis.
4. Aortic Atherosclerosis ([OU]-[OU]).

## 2019-08-24 IMAGING — CT CT CHEST W/ CM
2 of 5 series · 12 of 36 positions shown, 15 images · IV contrast (OMNIPAQUE)
Comparison: [DATE] chest CT.

CLINICAL DATA: New diagnosis of right breast cancer. Antiestrogen
therapy in progress. Staging evaluation.

EXAM:
CT CHEST, ABDOMEN, AND PELVIS WITH CONTRAST
TECHNIQUE: Multidetector CT imaging of the chest, abdomen and pelvis was
performed following the standard protocol during bolus
administration of intravenous contrast.
CONTRAST:  100mL OMNIPAQUE IOHEXOL 300 MG/ML  SOLN

[Series 2: cap with · axial · 0.71mm/px · z∈[-563,-68]mm · 9 of 125 slices shown, 12 images]
[im 13/125  mediastinal]
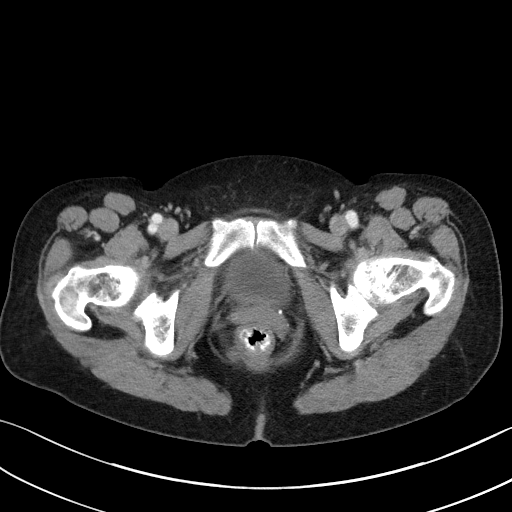
[im 13/125  lung]
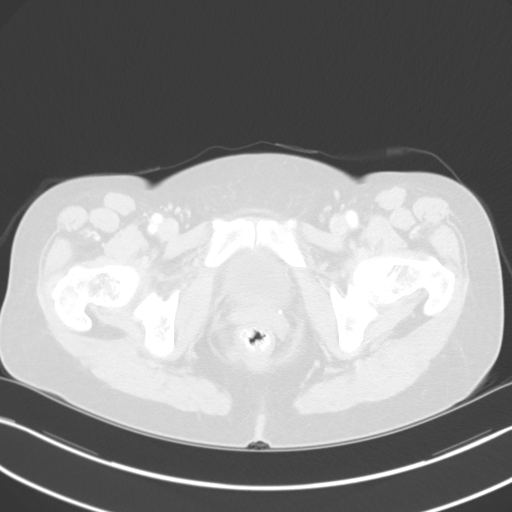
[im 25/125  lung]
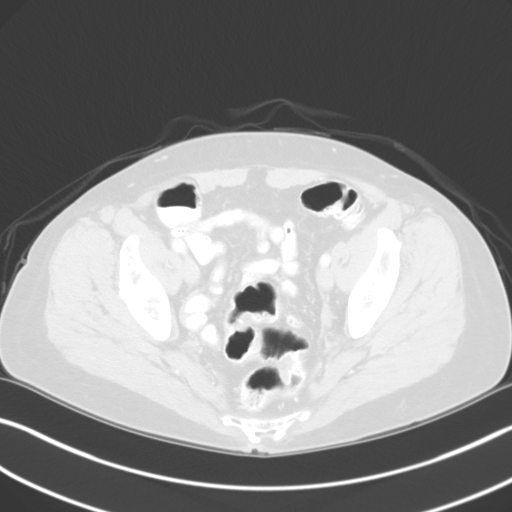
[im 38/125  lung]
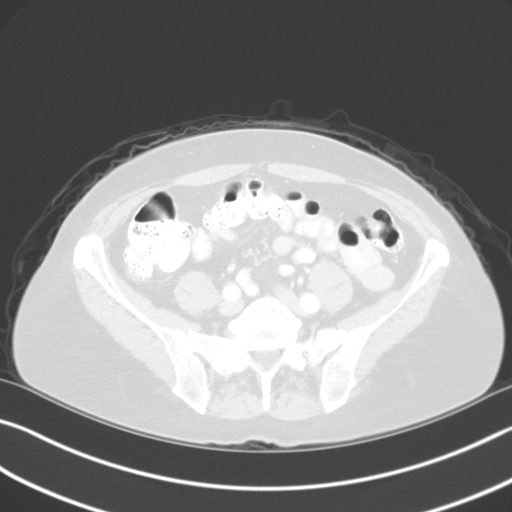
[im 50/125  lung]
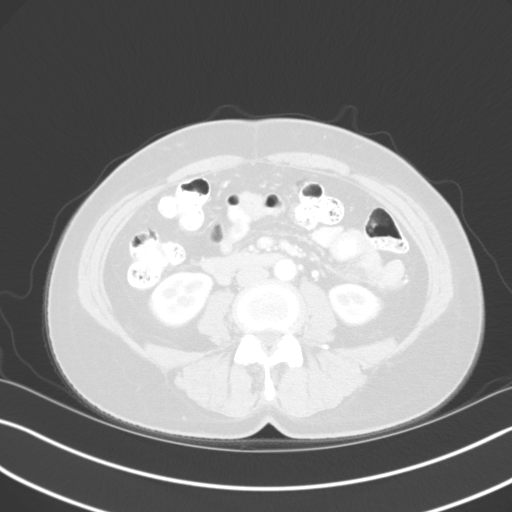
[im 63/125  mediastinal]
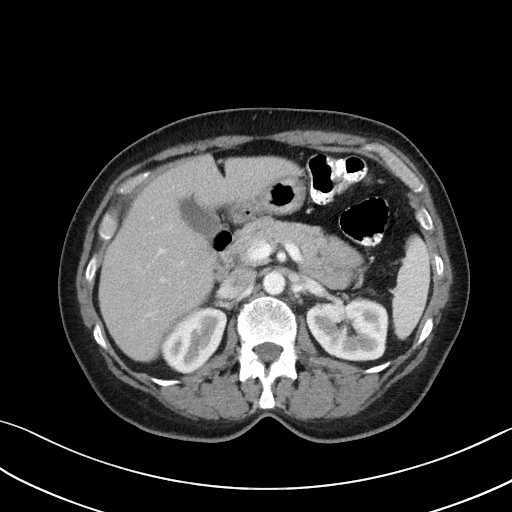
[im 63/125  lung]
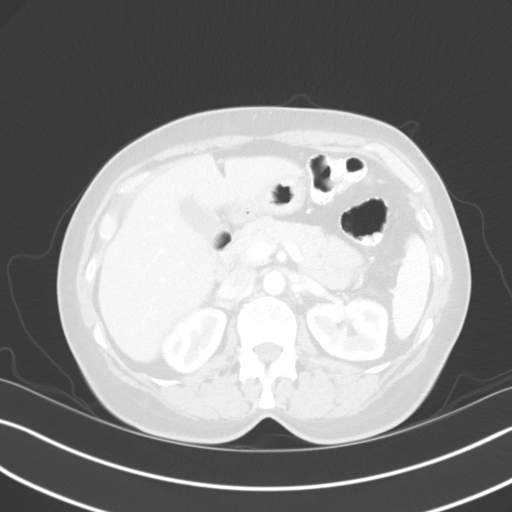
[im 75/125  lung]
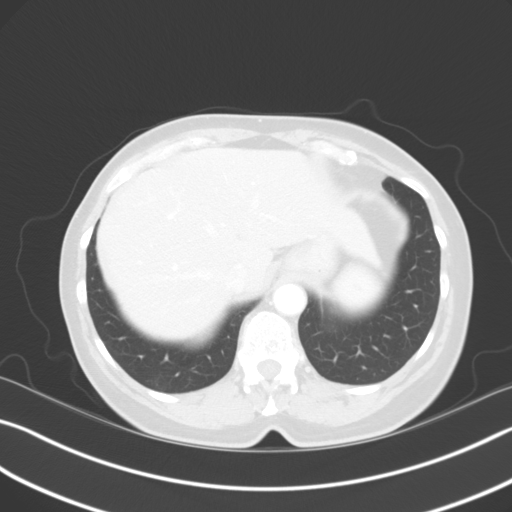
[im 87/125  lung]
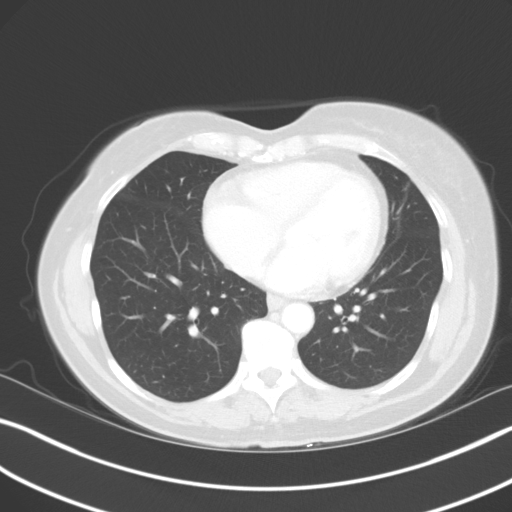
[im 100/125  lung]
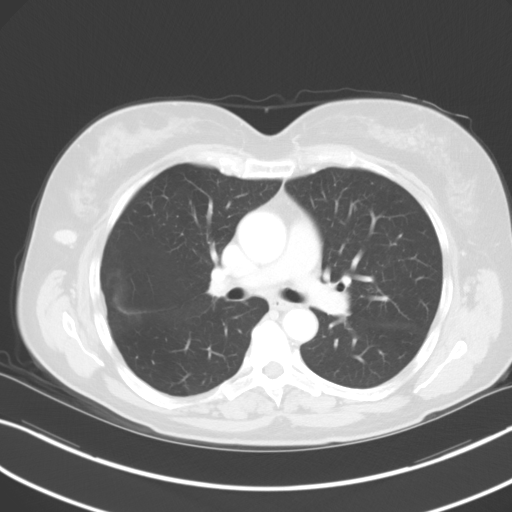
[im 112/125  mediastinal]
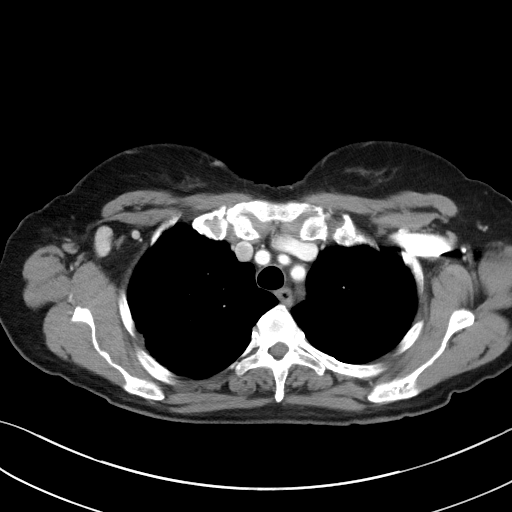
[im 112/125  lung]
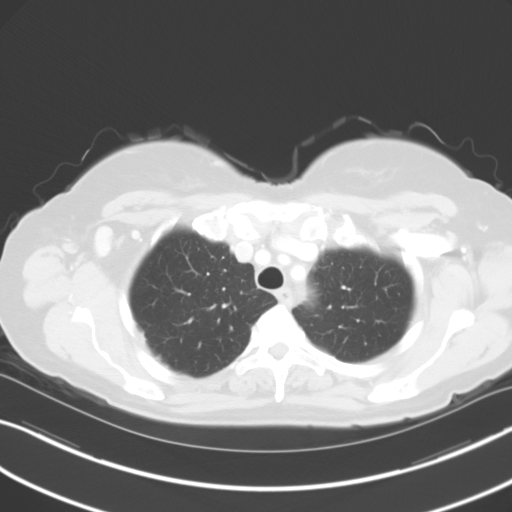

[Series 4: coronals · coronal · 0.76mm/px · 3 of 138 slices shown]
[im 28/138  lung]
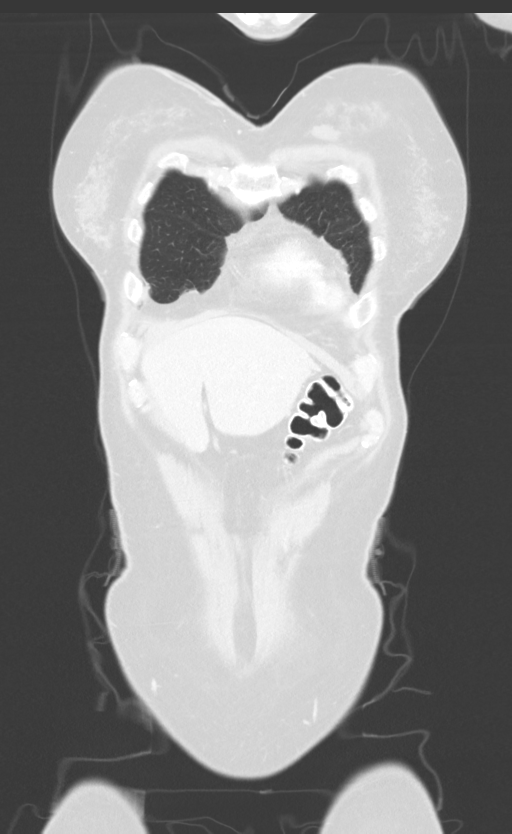
[im 55/138  lung]
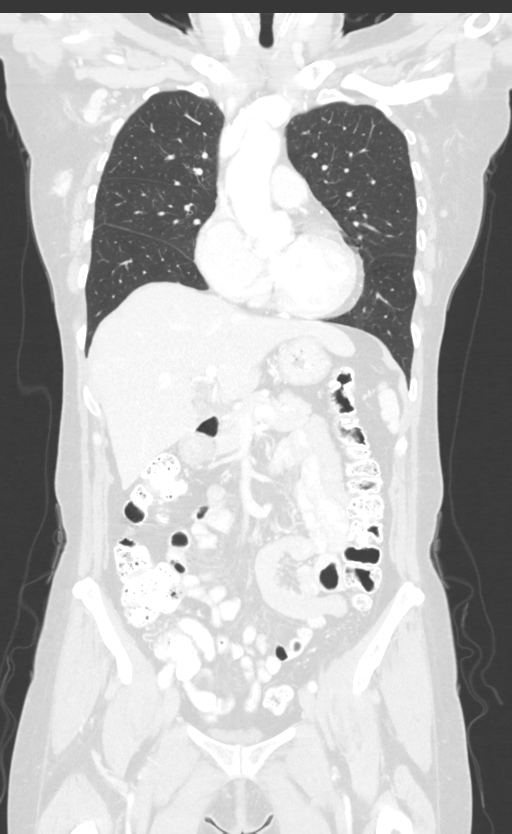
[im 83/138  lung]
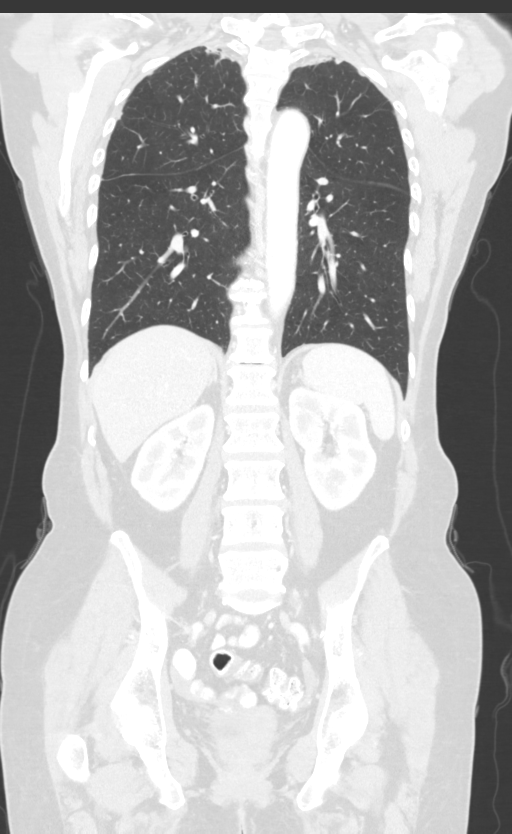

[12 of 36 positions shown; findings below may reference images not displayed]

FINDINGS: CT CHEST FINDINGS

Cardiovascular: Normal heart size. No significant pericardial
effusion/thickening. Mildly atherosclerotic nonaneurysmal thoracic
aorta. Normal caliber pulmonary arteries. No central pulmonary
emboli.

Mediastinum/Nodes: Apparent right hemithyroidectomy. No discrete
left thyroid nodules. Unremarkable esophagus. Mildly enlarged 1.0 cm
short axis diameter right axillary node containing a biopsy clip
(series 2/image 27). Additional enlarged 1.2 cm short axis diameter
right axillary node (series 2/image 16). Two borderline enlarged
round right retropectoral nodes, largest 0.9 cm short axis diameter
(series 2/image 11). No left axillary adenopathy. No pathologically
enlarged mediastinal or hilar nodes.

Lungs/Pleura: No pneumothorax. No pleural effusion. No acute
consolidative airspace disease, lung masses or significant pulmonary
nodules.

Musculoskeletal: No aggressive appearing focal osseous lesions. Mild
lower thoracic spondylosis. Scattered soft tissue gas in the inner
right breast from MR guided biopsy performed 1 day prior. Surgical
clip noted in the outer right breast.

CT ABDOMEN PELVIS FINDINGS

Hepatobiliary: Normal liver with no liver mass. Normal gallbladder
with no radiopaque cholelithiasis. No biliary ductal dilatation.

Pancreas: Normal, with no mass or duct dilation.

Spleen: Normal size spleen. Cystic 1.2 cm anterior splenic lesion,
compatible with a benign lesion such as a lymphangioma. No
additional splenic lesions.

Adrenals/Urinary Tract: Normal adrenals. Normal kidneys with no
hydronephrosis and no renal mass. Normal bladder.

Stomach/Bowel: Normal non-distended stomach. Normal caliber small
bowel with no small bowel wall thickening. Normal appendix. Oral
contrast transits to the rectum. Normal large bowel with no
diverticulosis, large bowel wall thickening or pericolonic fat
stranding.

Vascular/Lymphatic: Atherosclerotic nonaneurysmal abdominal aorta.
Patent portal, splenic, hepatic and renal veins. No pathologically
enlarged lymph nodes in the abdomen or pelvis.

Reproductive: Grossly normal uterus.  No adnexal mass.

Other: No pneumoperitoneum, ascites or focal fluid collection.

Musculoskeletal: Indistinct sclerotic 1.2 cm anterior left
acetabular lesion (series 2/image 110). No additional focal osseous
lesions.
IMPRESSION: 1. Right axillary and right retropectoral lymphadenopathy compatible
with nodal metastases.
2. Indistinct sclerotic 1.2 cm anterior left acetabular lesion,
indeterminate for osseous metastasis. Correlation with bone scan or
PET-CT may be obtained as clinically warranted.
3. No additional potential sites of metastatic disease in the chest,
abdomen or pelvis.
4. Aortic Atherosclerosis ([OU]-[OU]).

## 2019-08-24 MED ORDER — SODIUM CHLORIDE (PF) 0.9 % IJ SOLN
INTRAMUSCULAR | Status: AC
  Filled 2019-08-24: qty 50

## 2019-08-24 MED ORDER — IOHEXOL 300 MG/ML  SOLN
100.0000 mL | Freq: Once | INTRAMUSCULAR | Status: AC | PRN
  Administered 2019-08-24: 100 mL via INTRAVENOUS

## 2019-08-27 ENCOUNTER — Encounter: Payer: Self-pay | Admitting: *Deleted

## 2019-08-27 NOTE — Progress Notes (Signed)
Patient Care Team: Patient, No Pcp Per as PCP - General (General Practice) Mauro Kaufmann, RN as Medical Oncologist Rockwell Germany, RN as Oncology Nurse Navigator  DIAGNOSIS:    ICD-10-CM   1. Malignant neoplasm of lower-outer quadrant of right breast of female, estrogen receptor positive ([place])  C50.511    Z17.0     SUMMARY OF ONCOLOGIC HISTORY: Oncology History  Malignant neoplasm of lower-outer quadrant of right breast of female, estrogen receptor positive ([place])  08/01/2019 Initial Diagnosis   Palpable right breast mass for 1 month.  Mammogram revealed a 5 cm mass 8:30 position, 3 cm intramammary lymph node at 10 o'clock position and 2 enlarged right axillary lymph nodes.  Biopsy revealed grade 1 IDC ER 90%, PR 10%, Ki-67 10%, HER-2 negative.  Intramammary node and axillary lymph node both positive with similar prognostic profile   08/08/2019 -  Neo-Adjuvant Anti-estrogen oral therapy   Anastrozole while deciding treatment plan      CHIEF COMPLIANT: Follow-up to discuss treatment plan   INTERVAL HISTORY: [name] is a 64 y.o. with above-mentioned history of right breast cancer currently on neoadjuvant antiestrogen therapy with anastrozole. Mammaprint testing revealed she was low risk. Biopsy of right breast non-mass enhancement showed invasive ductal carcinoma, grade 1. CT CAP on 08/24/19 showed right axillary and right retropectoral lymphadenopathy compatible with nodal metastases and an indistinct sclerotic anterior left acetabular lesion, indeterminate for osseous metastasis. She presents to the clinic today to discuss further treatment.  She is tolerating the treatment with anastrozole very well without any problems or concerns.  ALLERGIES:  has no allergies on file.  MEDICATIONS:  Current Outpatient Medications  Medication Sig Dispense Refill  . anastrozole (ARIMIDEX) 1 MG tablet Take 1 tablet (1 mg total) by mouth daily. 90 tablet 3   No current  facility-administered medications for this visit.    PHYSICAL EXAMINATION: ECOG PERFORMANCE STATUS: 1 - Symptomatic but completely ambulatory  There were no vitals filed for this visit. There were no vitals filed for this visit.  LABORATORY DATA:  I have reviewed the data as listed CMP Latest Ref Rng & Units 08/23/2019 06/12/2007  Glucose 70 - 99 mg/dL 122(H) 111(H)  BUN 8 - 23 mg/dL 13 10  Creatinine 0.44 - 1.00 mg/dL 0.83 0.72  Sodium 135 - 145 mmol/L 143 137  Potassium 3.5 - 5.1 mmol/L 4.4 3.7  Chloride 98 - 111 mmol/L 105 105  CO2 22 - 32 mmol/L 27 26  Calcium 8.9 - 10.3 mg/dL 9.9 8.9  Total Protein 6.5 - 8.1 g/dL 7.9 -  Total Bilirubin 0.3 - 1.2 mg/dL 0.5 -  Alkaline Phos 38 - 126 U/L 66 -  AST 15 - 41 U/L 16 -  ALT 0 - 44 U/L 16 -    Lab Results  Component Value Date   WBC 7.8 06/12/2007   HGB 13.4 06/12/2007   HCT 38.0 06/12/2007   MCV 90.4 06/12/2007   PLT 314 06/12/2007   NEUTROABS 5.4 06/12/2007    ASSESSMENT & PLAN:  Malignant neoplasm of lower-outer quadrant of right breast of female, estrogen receptor positive ([place]) 08/01/2019:Palpable right breast mass for 1 month. Mammogram revealed a 5 cm mass 8:30 position, 3 cm intramammary lymph node at 10 o'clock position and 2 enlarged right axillary lymph nodes. Biopsy revealed grade 1 IDC ER 90%, PR 10%, Ki-67 10%, HER-2 negative. Intramammary node and axillary lymph node both positive with similar prognostic profile  Breast MRI 08/15/2019: Biopsy-proven malignancy lower  outer quadrant right breast, non-mass enhancement spanning 8.7 cm with probable invasion of underlying pectoral muscle.  Biopsy-proven metastatic lymph node in the right axillary tail.  Mild asymmetric right infraclavicular adenopathy.    Biopsy positive for invasive ductal carcinoma grade 1  CT CAP 08/24/2019: Right axillary and right retropectoral adenopathy compatible with nodal metastases.  Indistinct sclerotic 1.2 cm left acetabular lesion.  No  other distant metastases  Current treatment: Neoadjuvant antiestrogen therapy with anastrozole 1 mg daily started 08/08/2019 Anastrozole toxicities: Denies any side effects to anastrozole therapy.  MammaPrint: Luminal type A, low risk, no benefit to chemotherapy  Plan: Obtain bone scan If the bone scan is positive, then we will add Ibrance.  She will still undergo definitive treatment for oligometastatic disease. I discussed the pros and cons and the mechanism of action of Ibrance. We will call her with the result of this test.  Our plan is to obtain a mammogram and ultrasound in 2 months and follow-up after that. Continue with antiestrogen therapy neo-adjuvantly.     No orders of the defined types were placed in this encounter.  The patient has a good understanding of the overall plan. she agrees with it. she will call with any problems that may develop before the next visit here.  Total time spent: 30 mins including face to face time and time spent for planning, charting and coordination of care  [name], MD 08/28/2019  I, Cloyde Reams Dorshimer, am acting as scribe for Dr. [name].  I have reviewed the above documentation for accuracy and completeness, and I agree with the above.

## 2019-08-28 ENCOUNTER — Inpatient Hospital Stay (HOSPITAL_BASED_OUTPATIENT_CLINIC_OR_DEPARTMENT_OTHER): Payer: Commercial Managed Care - PPO | Admitting: Hematology and Oncology

## 2019-08-28 ENCOUNTER — Other Ambulatory Visit: Payer: Self-pay

## 2019-08-28 DIAGNOSIS — C50511 Malignant neoplasm of lower-outer quadrant of right female breast: Secondary | ICD-10-CM | POA: Diagnosis not present

## 2019-08-28 DIAGNOSIS — Z170 Estrogen receptor positive status [ER+]: Secondary | ICD-10-CM

## 2019-08-28 MED ORDER — LEVOTHYROXINE SODIUM 125 MCG PO TABS
125.0000 ug | ORAL_TABLET | Freq: Every day | ORAL | Status: AC
Start: 2019-08-28 — End: ?

## 2019-08-28 MED ORDER — ROSUVASTATIN CALCIUM 10 MG PO TABS
10.0000 mg | ORAL_TABLET | Freq: Every day | ORAL | Status: AC
Start: 2019-08-28 — End: ?

## 2019-08-28 NOTE — Assessment & Plan Note (Addendum)
08/01/2019:Palpable right breast mass for 1 month. Mammogram revealed a 5 cm mass 8:30 position, 3 cm intramammary lymph node at 10 o'clock position and 2 enlarged right axillary lymph nodes. Biopsy revealed grade 1 IDC ER 90%, PR 10%, Ki-67 10%, HER-2 negative. Intramammary node and axillary lymph node both positive with similar prognostic profile  Breast MRI 08/15/2019: Biopsy-proven malignancy lower outer quadrant right breast, non-mass enhancement spanning 8.7 cm with probable invasion of underlying pectoral muscle.  Biopsy-proven metastatic lymph node in the right axillary tail.  Mild asymmetric right infraclavicular adenopathy.  Recommendation: Biopsy of non-mass enhancement  CT CAP 08/24/2019: Right axillary and right retropectoral adenopathy compatible with nodal metastases.  Indistinct sclerotic 1.2 cm left acetabular lesion.  No other distant metastases  Current treatment: Neoadjuvant antiestrogen therapy with anastrozole 1 mg daily started 08/08/2019 Anastrozole toxicities: Denies any side effects to anastrozole therapy.  MammaPrint: Luminal type A, low risk, no benefit to chemotherapy  Plan: Obtain bone scan Continue with antiestrogen therapy new adjuvantly.

## 2019-08-29 ENCOUNTER — Encounter: Payer: Self-pay | Admitting: *Deleted

## 2019-08-29 ENCOUNTER — Telehealth: Payer: Self-pay | Admitting: Hematology and Oncology

## 2019-08-29 ENCOUNTER — Encounter: Payer: Self-pay | Admitting: Hematology and Oncology

## 2019-08-29 NOTE — Telephone Encounter (Signed)
Scheduled per 7/13 los. Called and spoke with pt, confirmed 9/23 appt

## 2019-09-07 ENCOUNTER — Encounter (HOSPITAL_COMMUNITY)
Admission: RE | Admit: 2019-09-07 | Discharge: 2019-09-07 | Disposition: A | Discharge status: Home / Self Care | Payer: Commercial Managed Care - PPO | Source: Ambulatory Visit | Attending: Hematology and Oncology | Admitting: Hematology and Oncology

## 2019-09-07 ENCOUNTER — Other Ambulatory Visit: Payer: Self-pay

## 2019-09-07 DIAGNOSIS — Z170 Estrogen receptor positive status [ER+]: Secondary | ICD-10-CM | POA: Diagnosis present

## 2019-09-07 DIAGNOSIS — C50511 Malignant neoplasm of lower-outer quadrant of right female breast: Secondary | ICD-10-CM

## 2019-09-07 IMAGING — NM NM BONE WHOLE BODY
2 series · 2 of 2 positions shown · non-contrast
Comparison: CT [DATE]

CLINICAL DATA: Breast cancer. Sclerotic lesion identified within
the left acetabulum on CT. Evaluate for osseous metastatic disease.

EXAM:
NUCLEAR MEDICINE WHOLE BODY BONE SCAN
TECHNIQUE: Whole body anterior and posterior images were obtained approximately
3 hours after intravenous injection of radiopharmaceutical.
RADIOPHARMACEUTICALS:  20.2 mCi [I1] MDP IV

[Series 1: whole body · 2.66mm/px · 1 of 1 slices shown (1 of 2)]
[im 1/1]
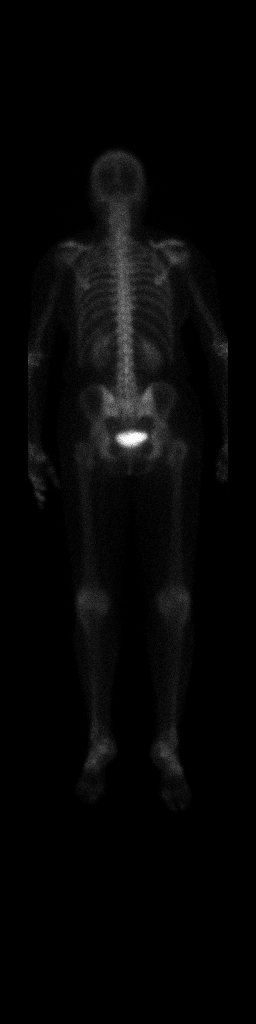

[Series 1: whole body · 2.66mm/px · 1 of 1 slices shown (2 of 2)]
[im 1/1]
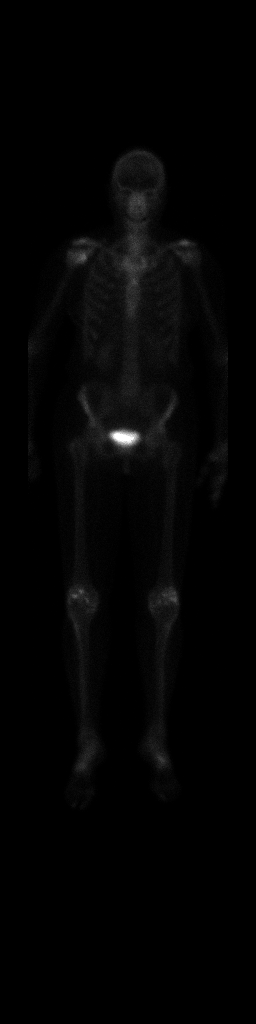

[2 of 2 positions shown; findings below may reference images not displayed]

FINDINGS: Physiologic distribution of radiotracer with uptake identified
within the bilateral kidneys and excretion present within the
urinary bladder. No focal scintigraphic uptake within the left
acetabulum to correspond to previously seen sclerotic bone lesion on
CT [DATE]. Typical distribution of degenerative uptake within
the bilateral shoulders, bilateral knees, and bilateral feet. No
suspicious areas of focal radiotracer uptake to suggest osseous
metastatic disease.
IMPRESSION: 1. No focal scintigraphic uptake within the left acetabulum to
correspond to the previously seen sclerotic bone lesion from CT
[DATE]. In correlation with the previous CT, findings favor the
lesion to represent a benign fibro-osseous lesion or enostosis.
2. No suspicious areas of focal radiotracer uptake to suggest
osseous metastatic disease.

## 2019-09-07 MED ORDER — TECHNETIUM TC 99M MEDRONATE IV KIT
20.2000 | PACK | Freq: Once | INTRAVENOUS | Status: AC
  Administered 2019-09-07: 20.2 via INTRAVENOUS

## 2019-09-10 ENCOUNTER — Encounter: Payer: Self-pay | Admitting: *Deleted

## 2019-09-10 ENCOUNTER — Telehealth: Payer: Self-pay | Admitting: Hematology and Oncology

## 2019-09-10 NOTE — Telephone Encounter (Signed)
I informed her the results of the bone scan was negative for any metastatic disease.

## 2019-10-30 ENCOUNTER — Ambulatory Visit
Admission: RE | Admit: 2019-10-30 | Discharge: 2019-10-30 | Disposition: A | Discharge status: Home / Self Care | Payer: Commercial Managed Care - PPO | Source: Ambulatory Visit | Attending: Hematology and Oncology | Admitting: Hematology and Oncology

## 2019-10-30 ENCOUNTER — Other Ambulatory Visit: Payer: Self-pay

## 2019-10-30 DIAGNOSIS — C50511 Malignant neoplasm of lower-outer quadrant of right female breast: Secondary | ICD-10-CM

## 2019-10-30 DIAGNOSIS — Z170 Estrogen receptor positive status [ER+]: Secondary | ICD-10-CM

## 2019-10-30 IMAGING — US US BREAST*R* LIMITED INC AXILLA
1 series · 13 of 20 positions shown · non-contrast
Comparison: Previous exam(s).

CLINICAL DATA: Reassessment of right breast carcinoma. Patient is
on neoadjuvant hormonal therapy. Patient was diagnosed with right
breast carcinoma and metastatic right axillary lymphadenopathy on
[DATE]. She also underwent additional right breast biopsy under
MRI guidance to assess extent of disease.

EXAM:
DIGITAL DIAGNOSTIC RIGHT MAMMOGRAM WITH CAD AND TOMO
ULTRASOUND RIGHT BREAST

[Series 1: us breast*right* limited inc axilla · 0.07mm/px · 13 of 20 slices shown]
[im 1/20]
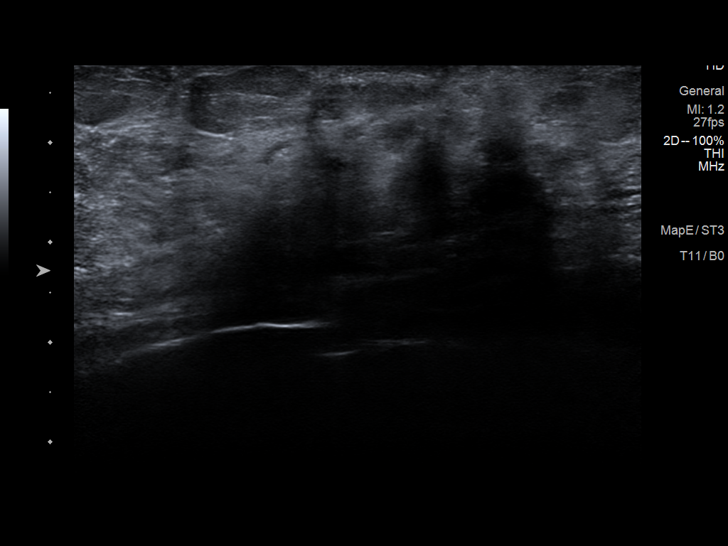
[im 3/20]
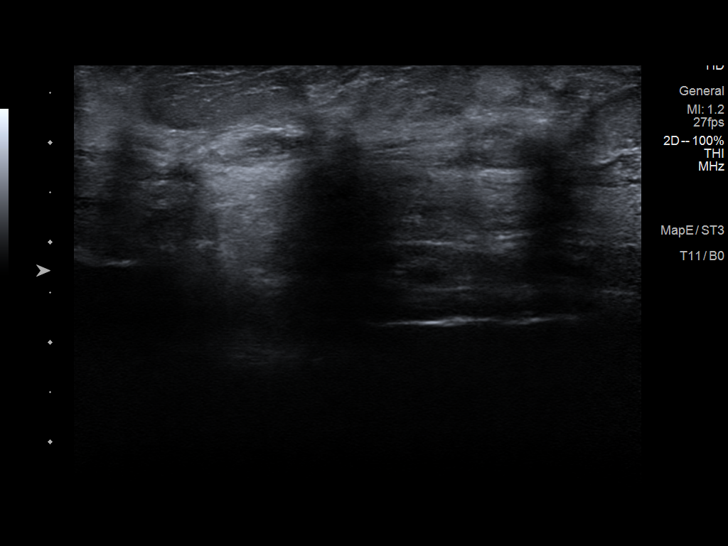
[im 4/20]
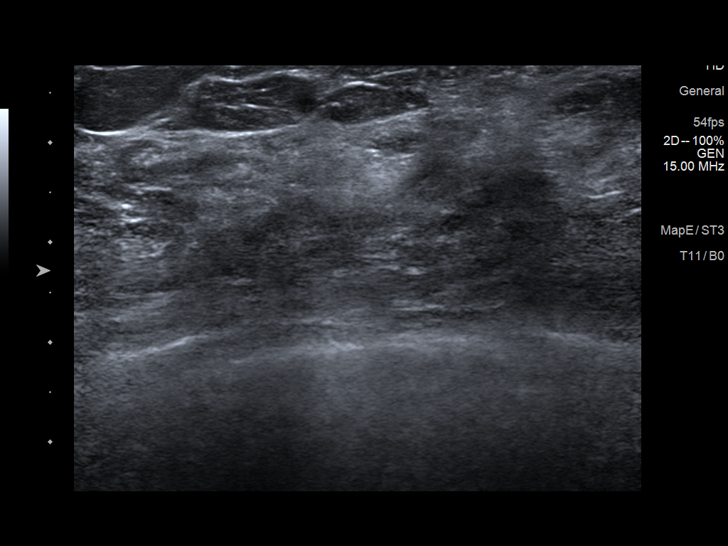
[im 6/20]
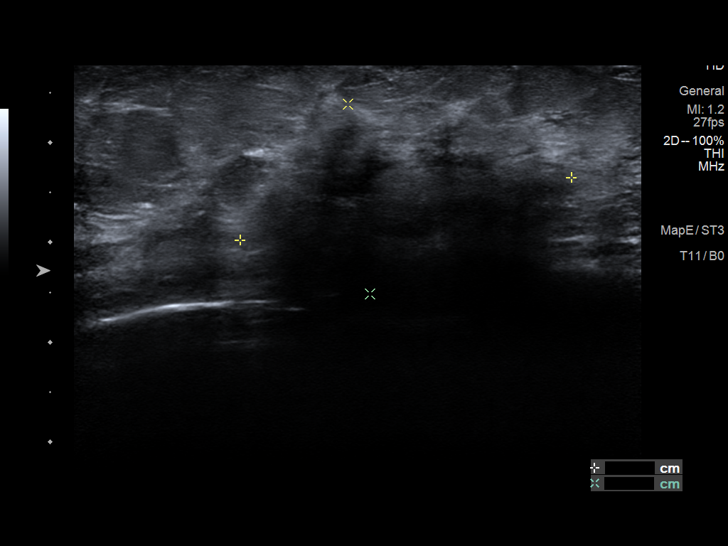
[im 7/20]
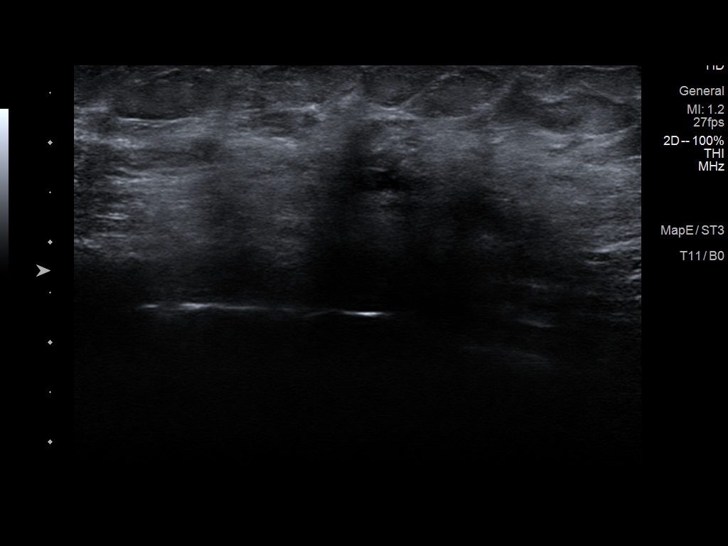
[im 9/20]
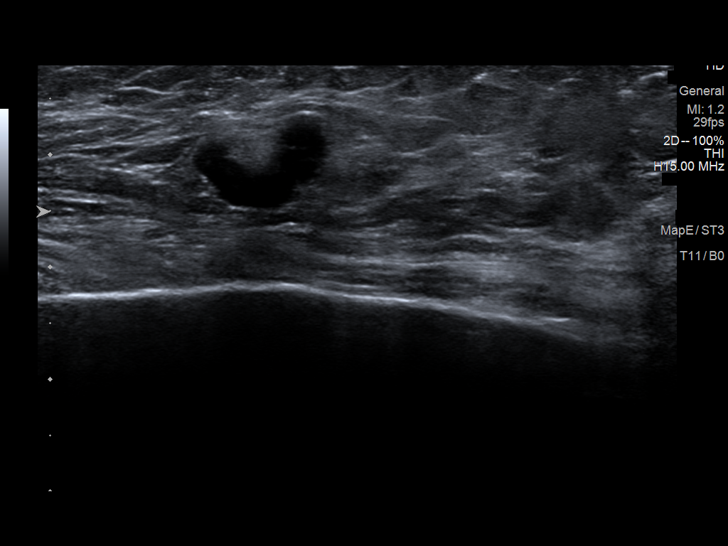
[im 11/20]
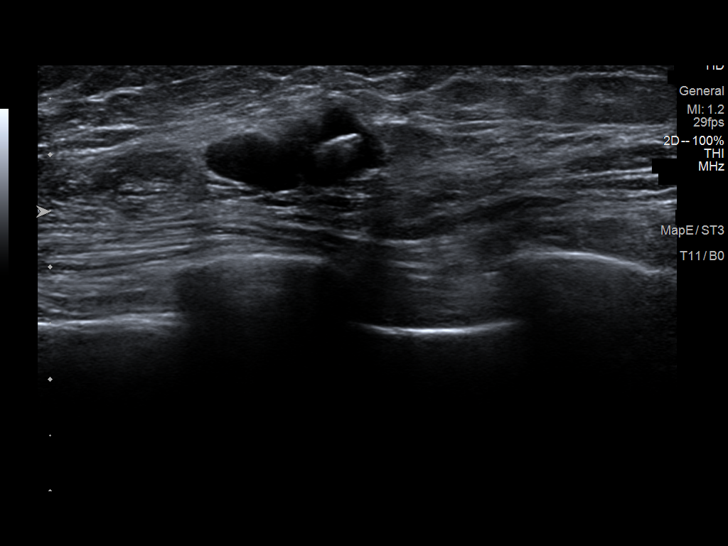
[im 12/20]
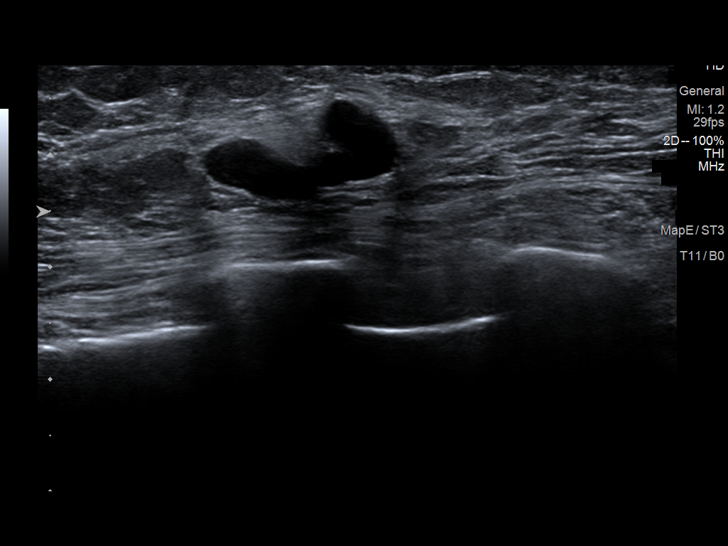
[im 14/20]
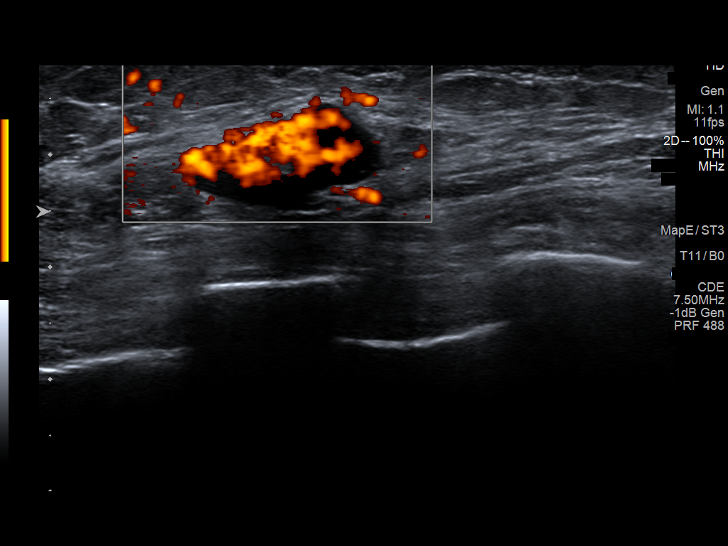
[im 15/20]
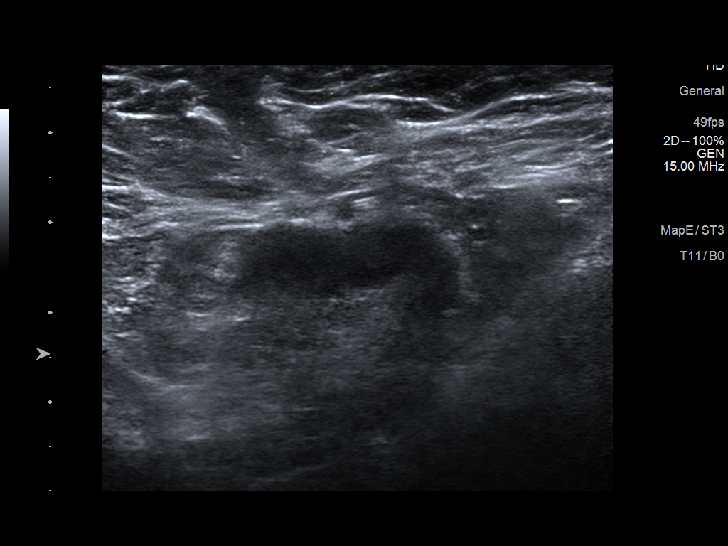
[im 17/20]
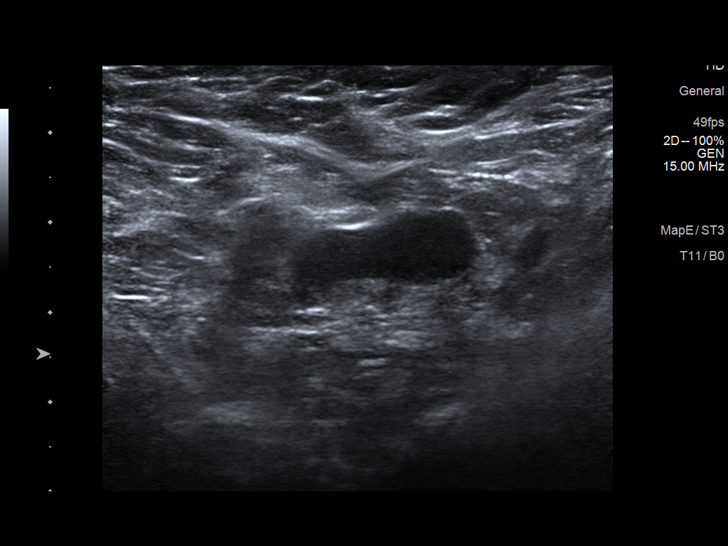
[im 18/20]
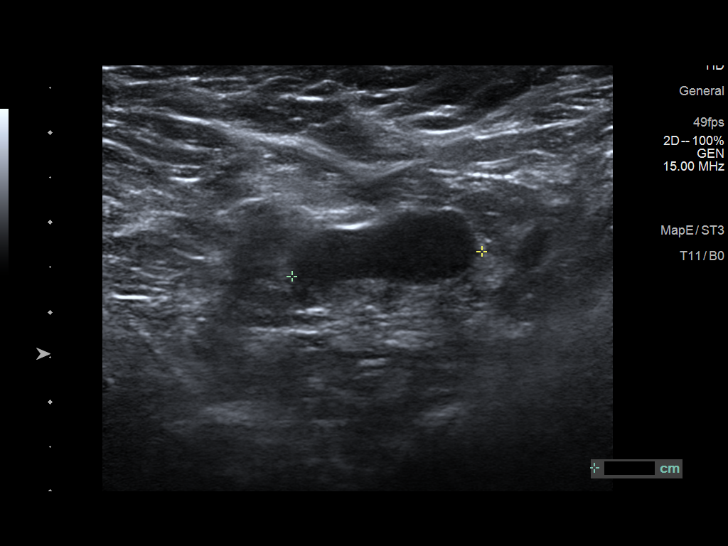
[im 20/20]
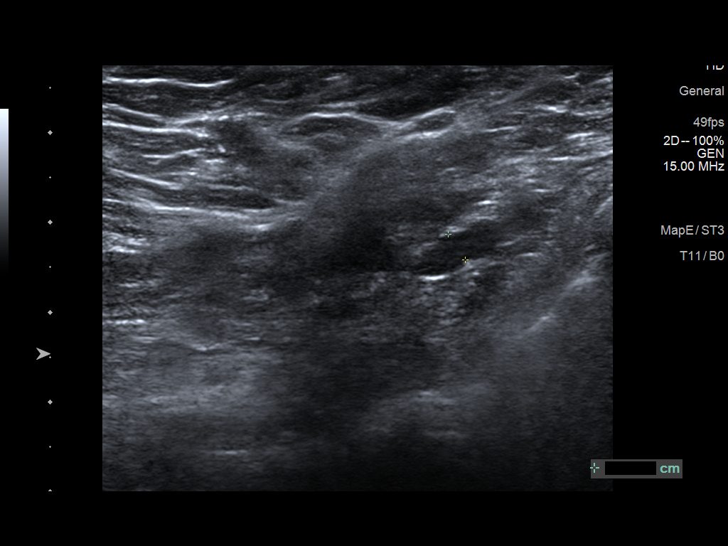

[13 of 20 positions shown; findings below may reference images not displayed]

ACR Breast Density Category c: The breast tissue is heterogeneously
dense, which may obscure small masses.
FINDINGS: The primary breast carcinoma is visualized as an area of focal
opacity and associated architectural distortion in the posterior,
lateral aspect of the right breast. Although the change is subtle,
the focal opacity in the posterolateral right breast appears
improved. Architectural distortion is without significant change.

In the right axilla, the lower abnormal lymph node, containing a Q
shaped biopsy clip, has decreased in size. The second biopsied node
higher in the axilla is less completely defined, but also appears
decreased in size. No new enlarged lymph nodes.

Mammographic images were processed with CAD.

Targeted ultrasound is performed, showing heterogeneous ill-defined
mass, predominantly visualizes areas of irregular shadowing, in the
right breast centered at 8:30 o'clock, 5 cm the nipple. Given the
ill-defined margins, this is difficult to precisely measure, but is
approximately 3.9 x 1.9 x 3.4 cm in size. On the prior study this
measured approximately 4.6 x 1.9 x 4.0 cm.

More definitive is the assessment of the right axillary lymph nodes.
The metastatic lymph node at 10 o'clock, 8 cm the nipple, localized
with a Q shaped biopsy clip, has decreased in size with a current
maximal cortical thickness of 4 mm, previously of all 7 mm. The
other larger lymph node is in the higher axilla, localized with a
HydroMARK clip, has also significantly decreased in size, current
cortical thickness measuring 6 mm, previously 11 mm. An adjacent
lymph node has a cortical thickness of 3 mm, previously 4.5 mm.
IMPRESSION: 1. Interval positive response to neoadjuvant hormonal therapy with a
decrease in size in the index mass in the right breast, which is not
well defined sonographically, and a more definitive decrease in size
of 2 previously biopsied metastatic right axillary lymph nodes.

RECOMMENDATION:
1. Treatment as planned for the known right breast malignancy and
right axillary metastatic lymphadenopathy.

I have discussed the findings and recommendations with the patient.
If applicable, a reminder letter will be sent to the patient
regarding the next appointment.

BI-RADS CATEGORY  6: Known biopsy-proven malignancy.

## 2019-10-30 IMAGING — MG MM DIGITAL DIAGNOSTIC UNILAT*R* W/ TOMO W/ CAD
6 series · 6 of 18 positions shown · non-contrast
Comparison: Previous exam(s).

CLINICAL DATA: Reassessment of right breast carcinoma. Patient is
on neoadjuvant hormonal therapy. Patient was diagnosed with right
breast carcinoma and metastatic right axillary lymphadenopathy on
[DATE]. She also underwent additional right breast biopsy under
MRI guidance to assess extent of disease.

EXAM:
DIGITAL DIAGNOSTIC RIGHT MAMMOGRAM WITH CAD AND TOMO
ULTRASOUND RIGHT BREAST

[R MLO synth-2D (1 of 2)]
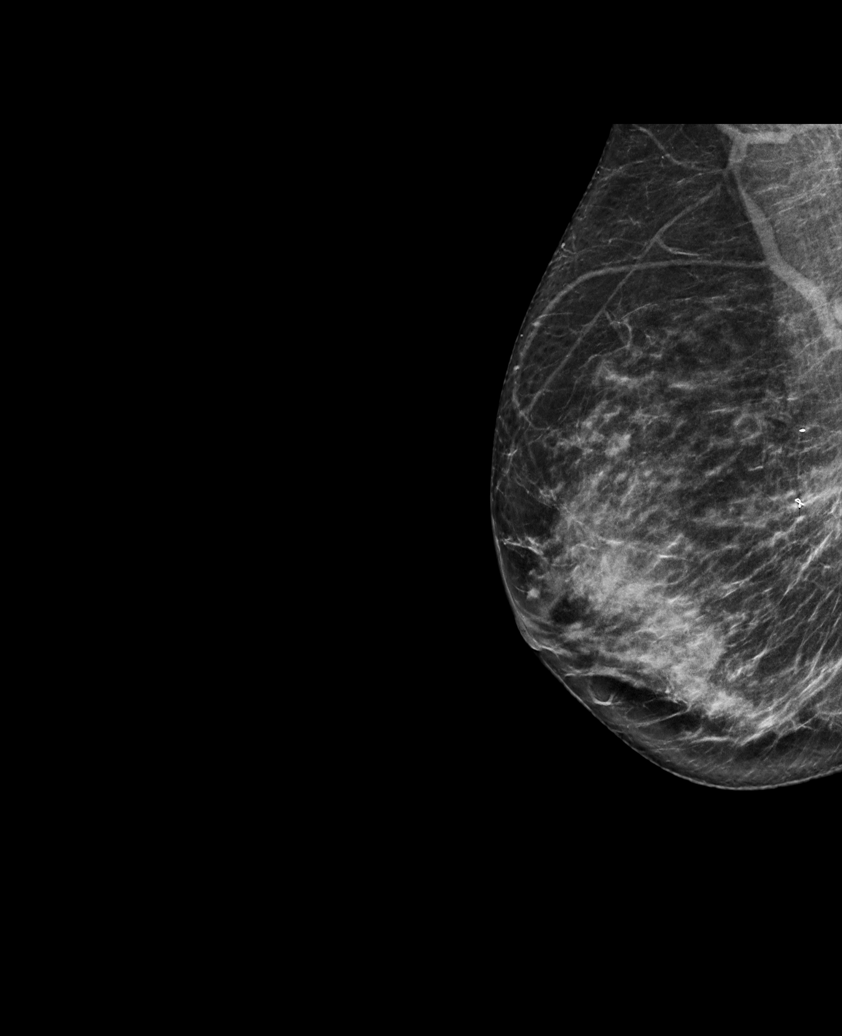

[R CC synth-2D]
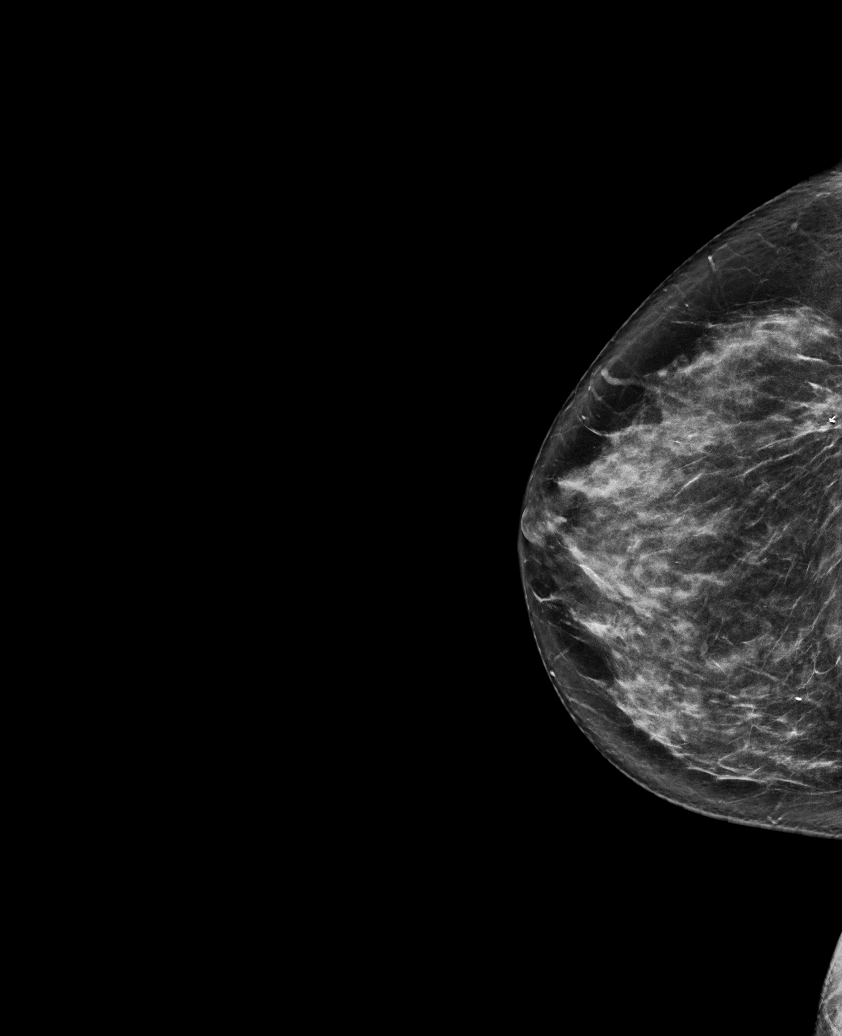

[R MLO synth-2D (2 of 2)]
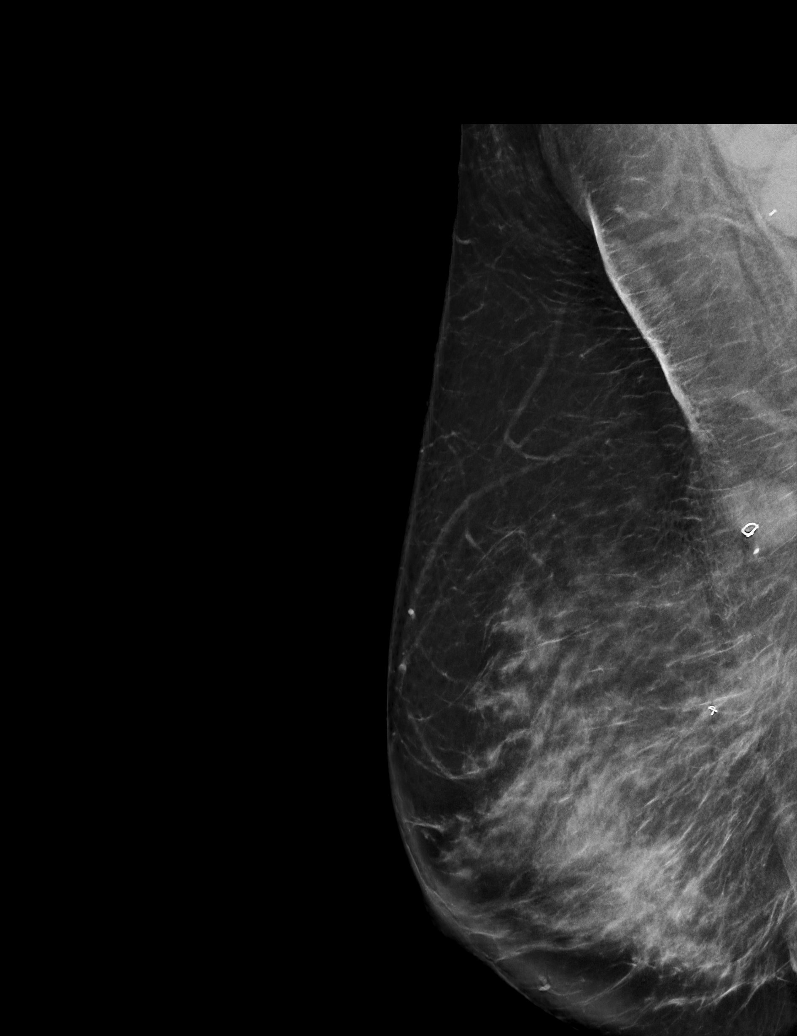

[R CC tomo · tomo slice 37/73.0]
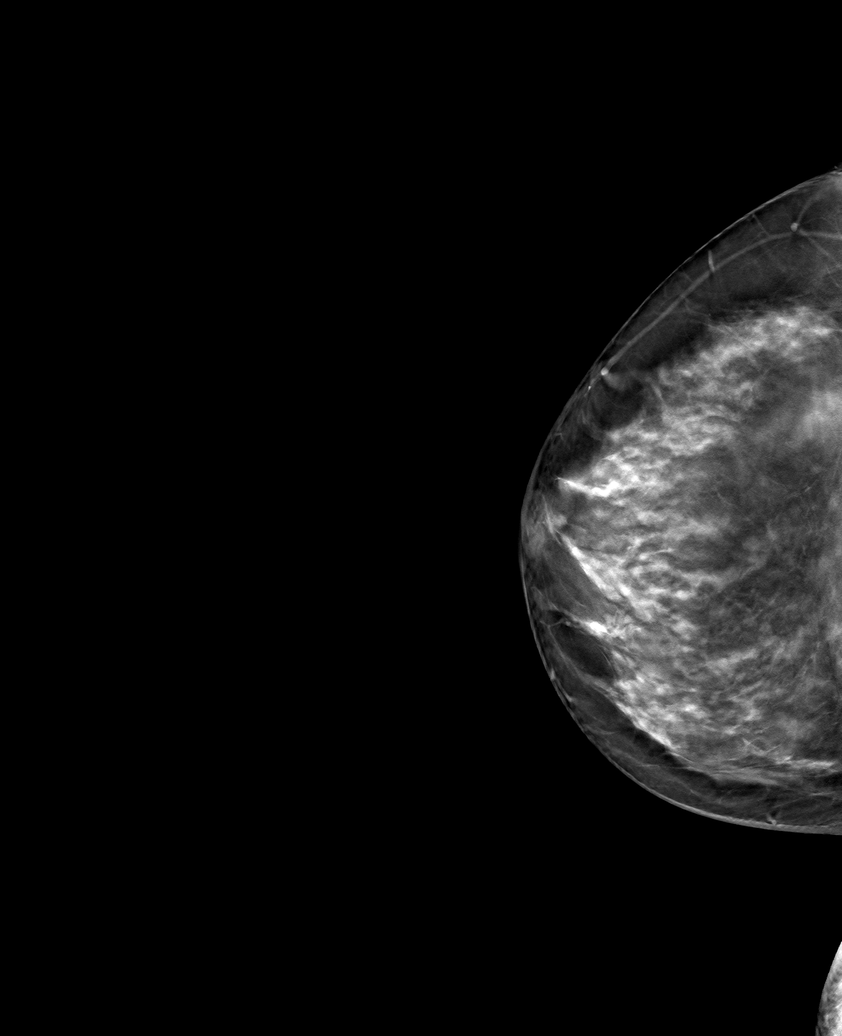

[R MLO tomo (1 of 2) · tomo slice 35/69.0]
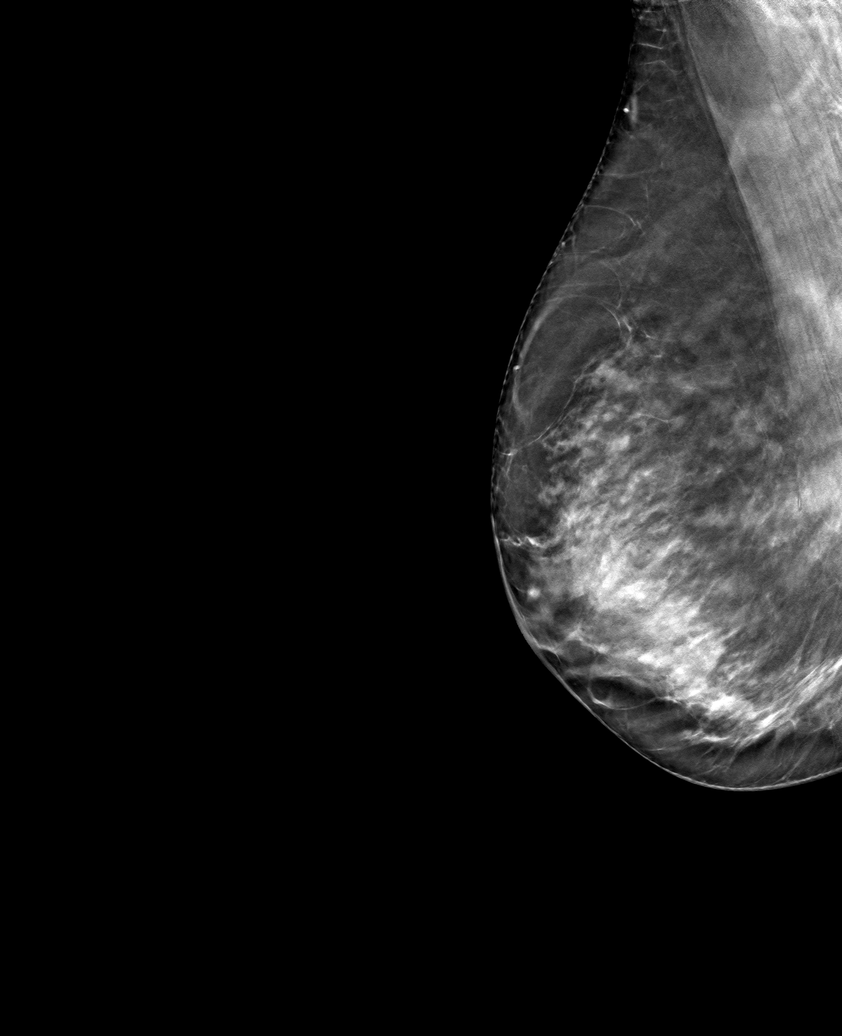

[R MLO tomo (2 of 2) · tomo slice 43/86.0]
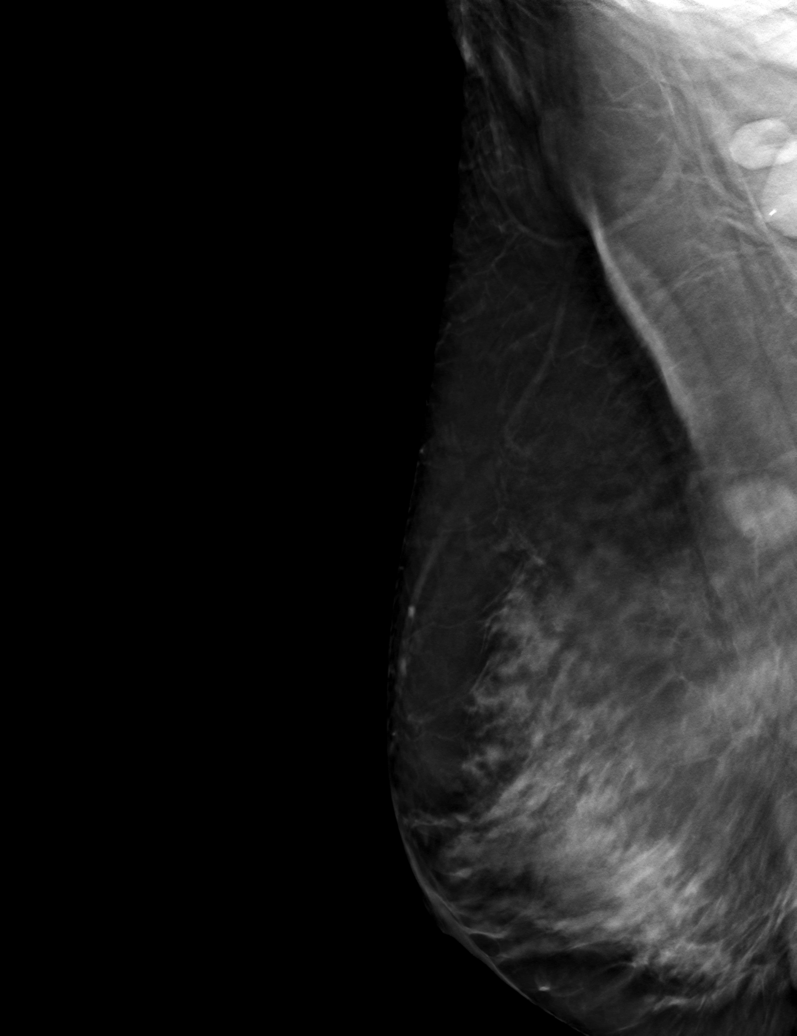

[6 of 18 positions shown; findings below may reference images not displayed]

ACR Breast Density Category c: The breast tissue is heterogeneously
dense, which may obscure small masses.
FINDINGS: The primary breast carcinoma is visualized as an area of focal
opacity and associated architectural distortion in the posterior,
lateral aspect of the right breast. Although the change is subtle,
the focal opacity in the posterolateral right breast appears
improved. Architectural distortion is without significant change.

In the right axilla, the lower abnormal lymph node, containing a Q
shaped biopsy clip, has decreased in size. The second biopsied node
higher in the axilla is less completely defined, but also appears
decreased in size. No new enlarged lymph nodes.

Mammographic images were processed with CAD.

Targeted ultrasound is performed, showing heterogeneous ill-defined
mass, predominantly visualizes areas of irregular shadowing, in the
right breast centered at 8:30 o'clock, 5 cm the nipple. Given the
ill-defined margins, this is difficult to precisely measure, but is
approximately 3.9 x 1.9 x 3.4 cm in size. On the prior study this
measured approximately 4.6 x 1.9 x 4.0 cm.

More definitive is the assessment of the right axillary lymph nodes.
The metastatic lymph node at 10 o'clock, 8 cm the nipple, localized
with a Q shaped biopsy clip, has decreased in size with a current
maximal cortical thickness of 4 mm, previously of all 7 mm. The
other larger lymph node is in the higher axilla, localized with a
HydroMARK clip, has also significantly decreased in size, current
cortical thickness measuring 6 mm, previously 11 mm. An adjacent
lymph node has a cortical thickness of 3 mm, previously 4.5 mm.
IMPRESSION: 1. Interval positive response to neoadjuvant hormonal therapy with a
decrease in size in the index mass in the right breast, which is not
well defined sonographically, and a more definitive decrease in size
of 2 previously biopsied metastatic right axillary lymph nodes.

RECOMMENDATION:
1. Treatment as planned for the known right breast malignancy and
right axillary metastatic lymphadenopathy.

I have discussed the findings and recommendations with the patient.
If applicable, a reminder letter will be sent to the patient
regarding the next appointment.

BI-RADS CATEGORY  6: Known biopsy-proven malignancy.

## 2019-10-31 ENCOUNTER — Encounter: Payer: Self-pay | Admitting: *Deleted

## 2019-11-08 ENCOUNTER — Other Ambulatory Visit: Payer: Self-pay

## 2019-11-08 ENCOUNTER — Inpatient Hospital Stay: Payer: Commercial Managed Care - PPO | Attending: Hematology and Oncology | Admitting: Hematology and Oncology

## 2019-11-08 ENCOUNTER — Encounter: Payer: Self-pay | Admitting: *Deleted

## 2019-11-08 DIAGNOSIS — C50511 Malignant neoplasm of lower-outer quadrant of right female breast: Secondary | ICD-10-CM | POA: Diagnosis present

## 2019-11-08 DIAGNOSIS — Z170 Estrogen receptor positive status [ER+]: Secondary | ICD-10-CM | POA: Diagnosis not present

## 2019-11-08 DIAGNOSIS — C773 Secondary and unspecified malignant neoplasm of axilla and upper limb lymph nodes: Secondary | ICD-10-CM | POA: Insufficient documentation

## 2019-11-08 NOTE — Assessment & Plan Note (Signed)
08/01/2019:Palpable right breast mass for 1 month. Mammogram revealed a 5 cm mass 8:30 position, 3 cm intramammary lymph node at 10 o'clock position and 2 enlarged right axillary lymph nodes. Biopsy revealed grade 1 IDC ER 90%, PR 10%, Ki-67 10%, HER-2 negative. Intramammary node and axillary lymph node both positive with similar prognostic profile  Breast MRI 08/15/2019: Biopsy-proven malignancy lower outer quadrant right breast, non-mass enhancement spanning 8.7 cm with probable invasion of underlying pectoral muscle. Biopsy-proven metastatic lymph node in the right axillary tail. Mild asymmetric right infraclavicular adenopathy.   Biopsy positive for invasive ductal carcinoma grade 1  CT CAP 08/24/2019: Right axillary and right retropectoral adenopathy compatible with nodal metastases.  Indistinct sclerotic 1.2 cm left acetabular lesion.  No other distant metastases  Current treatment: Neoadjuvant antiestrogen therapy with anastrozole 1 mg daily started 08/08/2019 Anastrozole toxicities:Denies any side effects to anastrozole therapy.  MammaPrint: Luminal type A, low risk, no benefit to chemotherapy Bone scan: Negative for metastatic disease  Mammogram and ultrasound 10/30/2019: Interval positive response to neoadjuvant hormone therapy with decrease in the breast mass from 4.6 cm to 3.9 cm, the lymph node right axilla decrease in size, second lymph node also decreased from 11 mm to 6 mm and 4.5 mm to 3 mm  Tumor board discussion: Recommendation is to continue for 3 more months of antiestrogen therapy then repeat the breast MRI and proceed with surgery at that time.  Return to clinic after 3 months after breast MRI

## 2019-11-08 NOTE — Progress Notes (Signed)
Patient Care Team: Patient, No Pcp Per as PCP - General (General Practice) Mauro Kaufmann, RN as Medical Oncologist Rockwell Germany, RN as Oncology Nurse Navigator  DIAGNOSIS:    ICD-10-CM   1. Malignant neoplasm of lower-outer quadrant of right breast of female, estrogen receptor positive ([place])  C50.511    Z17.0     SUMMARY OF ONCOLOGIC HISTORY: Oncology History  Malignant neoplasm of lower-outer quadrant of right breast of female, estrogen receptor positive ([place])  08/01/2019 Initial Diagnosis   Palpable right breast mass for 1 month.  Mammogram revealed a 5 cm mass 8:30 position, 3 cm intramammary lymph node at 10 o'clock position and 2 enlarged right axillary lymph nodes.  Biopsy revealed grade 1 IDC ER 90%, PR 10%, Ki-67 10%, HER-2 negative.  Intramammary node and axillary lymph node both positive with similar prognostic profile   08/08/2019 -  Neo-Adjuvant Anti-estrogen oral therapy   Anastrozole while deciding treatment plan      CHIEF COMPLIANT: Follow-up of right breast cancer on anastrozole   INTERVAL HISTORY: [name] is a 64 y.o. with above-mentioned history of right breast cancer currently on neoadjuvant antiestrogen therapy with anastrozole. Bone scan on 09/07/19 showed no evidence of osseous metastatic disease. Mammogram and [place] on 10/30/19 showed response to therapy with decrease in the size of the right breast mass and right axillary lymph nodes. She presents to the clinic today for follow-up.   ALLERGIES:  has No Known Allergies.  MEDICATIONS:  Current Outpatient Medications  Medication Sig Dispense Refill  . anastrozole (ARIMIDEX) 1 MG tablet Take 1 tablet (1 mg total) by mouth daily. 90 tablet 3  . levothyroxine (SYNTHROID) 125 MCG tablet Take 1 tablet (125 mcg total) by mouth daily before breakfast.    . rosuvastatin (CRESTOR) 10 MG tablet Take 1 tablet (10 mg total) by mouth daily.     No current facility-administered medications for this visit.     PHYSICAL EXAMINATION: ECOG PERFORMANCE STATUS: 1 - Symptomatic but completely ambulatory  Vitals:   11/08/19 1407  BP: 122/68  Pulse: 73  Resp: 18  Temp: 97.6 F (36.4 C)  SpO2: 100%   Filed Weights   11/08/19 1407  Weight: 144 lb 11.2 oz (65.6 kg)    BREAST: No palpable masses or nodules in either right or left breasts. No palpable axillary supraclavicular or infraclavicular adenopathy no breast tenderness or nipple discharge. (exam performed in the presence of a chaperone)  LABORATORY DATA:  I have reviewed the data as listed CMP Latest Ref Rng & Units 08/23/2019 06/12/2007  Glucose 70 - 99 mg/dL 122(H) 111(H)  BUN 8 - 23 mg/dL 13 10  Creatinine 0.44 - 1.00 mg/dL 0.83 0.72  Sodium 135 - 145 mmol/L 143 137  Potassium 3.5 - 5.1 mmol/L 4.4 3.7  Chloride 98 - 111 mmol/L 105 105  CO2 22 - 32 mmol/L 27 26  Calcium 8.9 - 10.3 mg/dL 9.9 8.9  Total Protein 6.5 - 8.1 g/dL 7.9 -  Total Bilirubin 0.3 - 1.2 mg/dL 0.5 -  Alkaline Phos 38 - 126 U/L 66 -  AST 15 - 41 U/L 16 -  ALT 0 - 44 U/L 16 -    Lab Results  Component Value Date   WBC 7.8 06/12/2007   HGB 13.4 06/12/2007   HCT 38.0 06/12/2007   MCV 90.4 06/12/2007   PLT 314 06/12/2007   NEUTROABS 5.4 06/12/2007    ASSESSMENT & PLAN:  Malignant neoplasm of lower-outer quadrant of  right breast of female, estrogen receptor positive ([place]) 08/01/2019:Palpable right breast mass for 1 month. Mammogram revealed a 5 cm mass 8:30 position, 3 cm intramammary lymph node at 10 o'clock position and 2 enlarged right axillary lymph nodes. Biopsy revealed grade 1 IDC ER 90%, PR 10%, Ki-67 10%, HER-2 negative. Intramammary node and axillary lymph node both positive with similar prognostic profile  Breast MRI 08/15/2019: Biopsy-proven malignancy lower outer quadrant right breast, non-mass enhancement spanning 8.7 cm with probable invasion of underlying pectoral muscle. Biopsy-proven metastatic lymph node in the right axillary tail. Mild  asymmetric right infraclavicular adenopathy.   Biopsy positive for invasive ductal carcinoma grade 1  CT CAP 08/24/2019: Right axillary and right retropectoral adenopathy compatible with nodal metastases.  Indistinct sclerotic 1.2 cm left acetabular lesion.  No other distant metastases  Current treatment:  1. Neoadjuvant antiestrogen therapy with anastrozole 1 mg daily started 08/08/2019 2. bilateral mastectomies  Anastrozole toxicities:Denies any side effects to anastrozole therapy.  MammaPrint: Luminal type A, low risk, no benefit to chemotherapy Bone scan: Negative for metastatic disease  Mammogram and ultrasound 10/30/2019: Interval positive response to neoadjuvant hormone therapy with decrease in the breast mass from 4.6 cm to 3.9 cm, the lymph node right axilla decrease in size, second lymph node also decreased from 11 mm to 6 mm and 4.5 mm to 3 mm  Tumor board discussion: Recommendation is to continue for 2-3 more months of antiestrogen therapy then repeat the breast MRI and proceed with surgery at that time.  Return to clinic in mid November after breast MRI She would like to finish the surgery in this calendar year.  She also wants to use the winter break to help recover from the surgery.   No orders of the defined types were placed in this encounter.  The patient has a good understanding of the overall plan. she agrees with it. she will call with any problems that may develop before the next visit here.  Total time spent: 30 mins including face to face time and time spent for planning, charting and coordination of care  [name], MD 11/08/2019  I, Cloyde Reams Dorshimer, am acting as scribe for Dr. [name].  I have reviewed the above documentation for accuracy and completeness, and I agree with the above.

## 2019-11-09 ENCOUNTER — Telehealth: Payer: Self-pay | Admitting: Hematology and Oncology

## 2019-11-09 NOTE — Telephone Encounter (Signed)
Scheduled per 9/23 los. Called and spoke with pt, confirmed 11/16 appt

## 2019-11-14 ENCOUNTER — Encounter: Payer: Self-pay | Admitting: *Deleted

## 2019-12-31 ENCOUNTER — Ambulatory Visit
Admission: RE | Admit: 2019-12-31 | Discharge: 2019-12-31 | Disposition: A | Discharge status: Home / Self Care | Payer: Commercial Managed Care - PPO | Source: Ambulatory Visit | Attending: Hematology and Oncology | Admitting: Hematology and Oncology

## 2019-12-31 ENCOUNTER — Other Ambulatory Visit: Payer: Self-pay

## 2019-12-31 DIAGNOSIS — Z170 Estrogen receptor positive status [ER+]: Secondary | ICD-10-CM

## 2019-12-31 DIAGNOSIS — C50511 Malignant neoplasm of lower-outer quadrant of right female breast: Secondary | ICD-10-CM

## 2019-12-31 IMAGING — MR MR BREAST BILAT WO/W CM
8 of 13 series · 30 of 48 positions shown · IV contrast (6 ml Gadavist)
Comparison: Prior studies including MRI on [DATE]

CLINICAL DATA: Assess treatment response. Status post neoadjuvant
treatment for RIGHT breast cancer.
TECHNIQUE: Multiplanar, multisequence MR images of both breasts were obtained
prior to and following the intravenous administration of 6 ml of
Gadavist

[Series 2: t2_tirm_tra ipat (a-p) · axial · 3.0mm · 0.70mm/px · 1 of 55 slices shown]
[im 1/55]
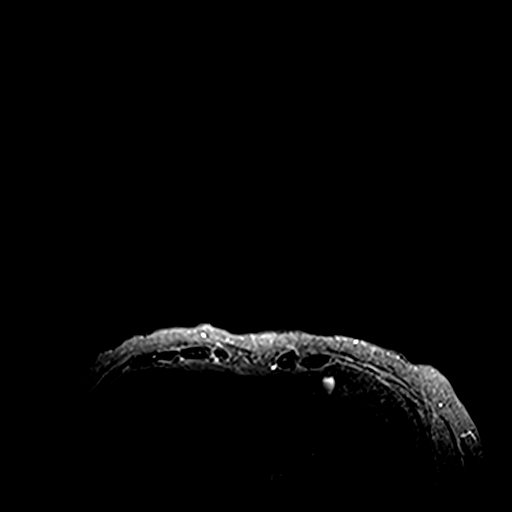

[Series 3: fl3d pre-cm no · axial · non-contrast · 1.2mm · 0.94mm/px · z∈[-73,+118]mm · 5 of 160 slices shown]
[im 1/160]
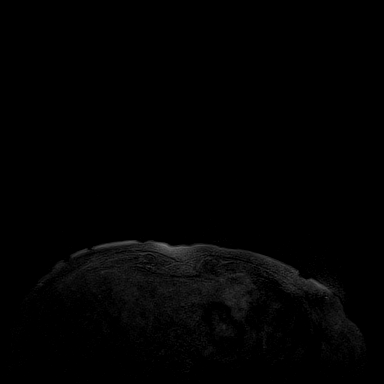
[im 40/160]
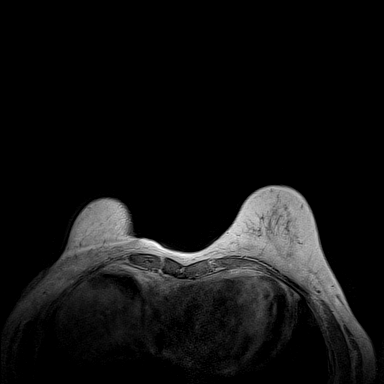
[im 80/160]
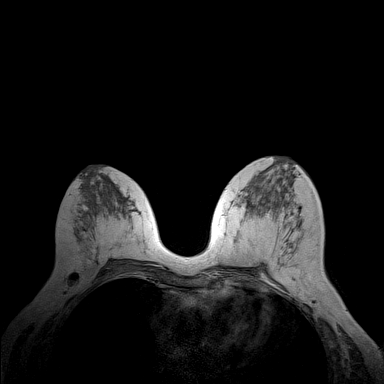
[im 120/160]
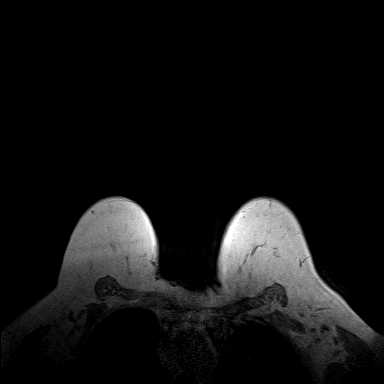
[im 160/160]
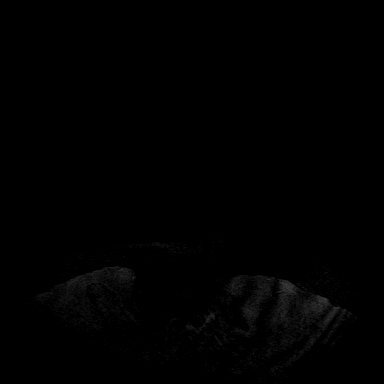

[Series 4: fl3d pre-cm · axial · non-contrast · 1.2mm · 0.94mm/px · z∈[-73,+118]mm · 5 of 160 slices shown]
[im 1/160]
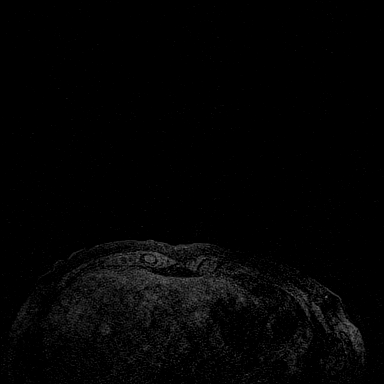
[im 40/160]
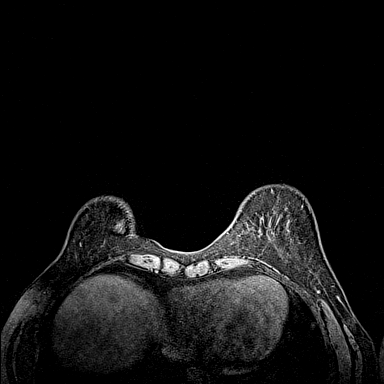
[im 80/160]
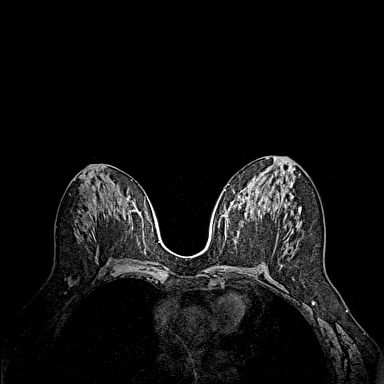
[im 120/160]
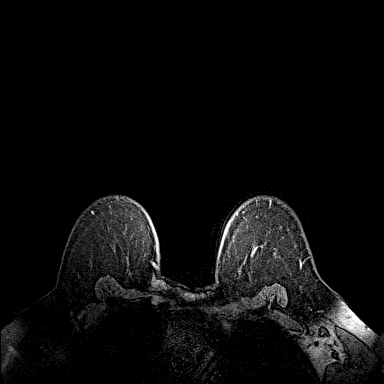
[im 160/160]
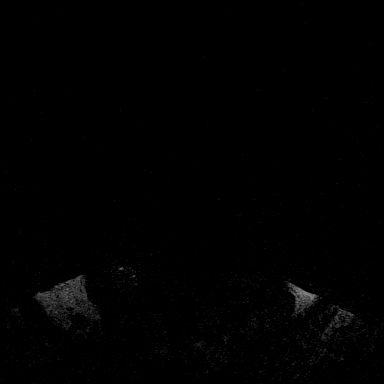

[Series 5: fl3d post-cm 20 · axial · 1.2mm · 0.94mm/px · z∈[-73,+118]mm · 5 of 160 slices shown (1 of 3)]
[im 1/160]
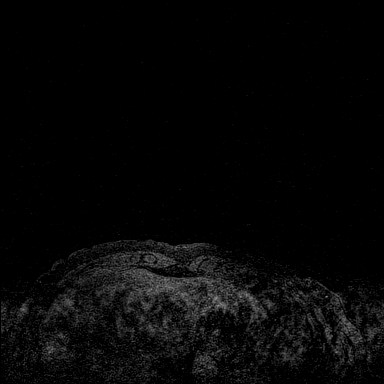
[im 40/160]
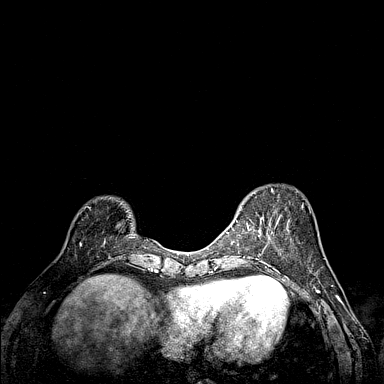
[im 80/160]
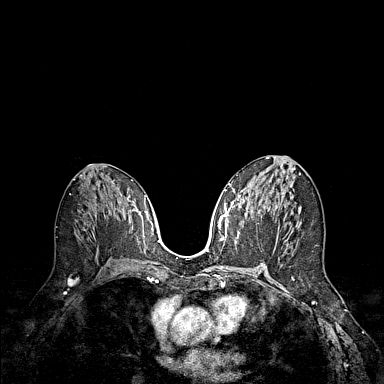
[im 120/160]
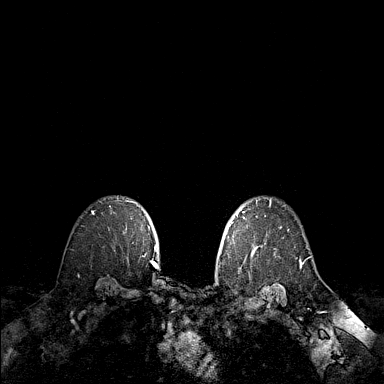
[im 160/160]
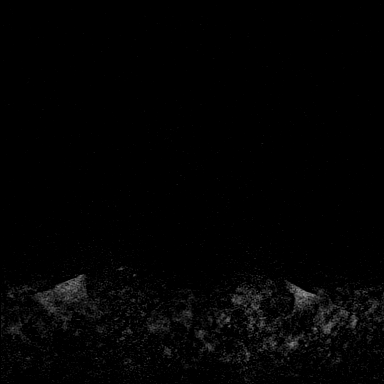

[Series 6: fl3d post-cm 20 · axial · 1.2mm · 0.94mm/px · z∈[-73,+118]mm · 5 of 160 slices shown (2 of 3)]
[im 1/160]
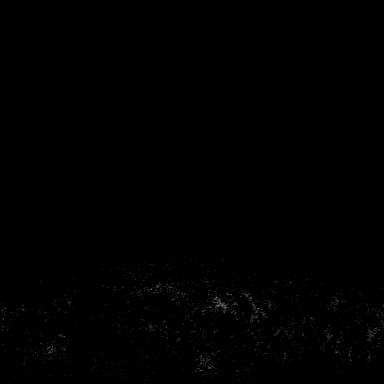
[im 40/160]
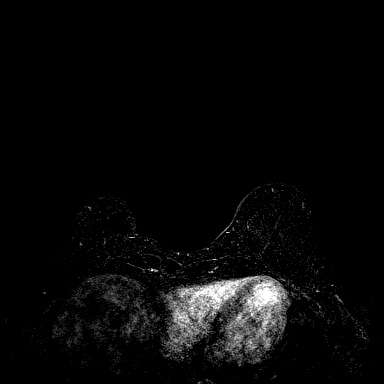
[im 80/160]
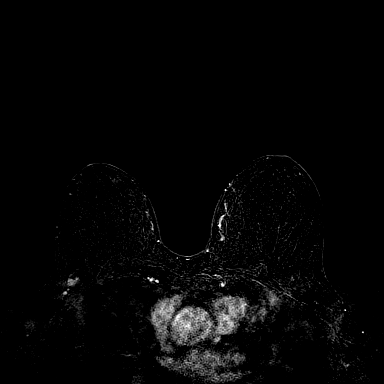
[im 120/160]
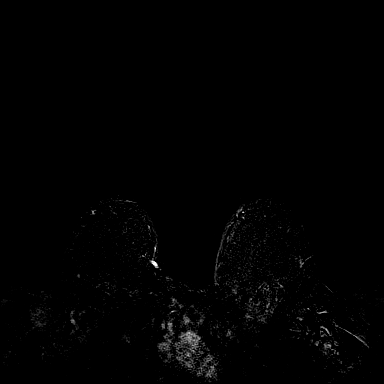
[im 160/160]
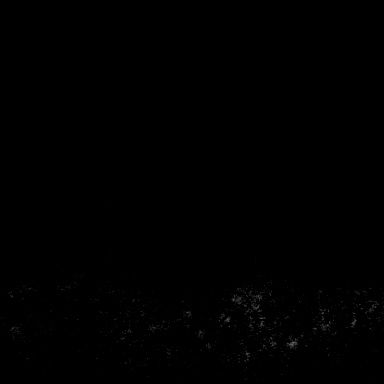

[Series 7: fl3d post-cm 20 · axial · 192.0mm · 0.94mm/px · 1 of 1 slices shown (3 of 3)]
[im 1/1]
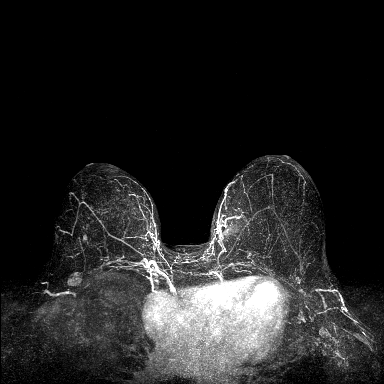

[Series 8: fl3d post-cm 3min · axial · 1.2mm · 0.94mm/px · z∈[-73,+118]mm · 5 of 160 slices shown]
[im 1/160]
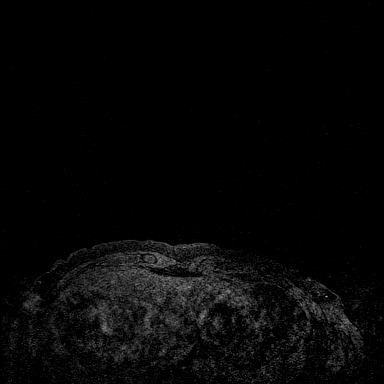
[im 40/160]
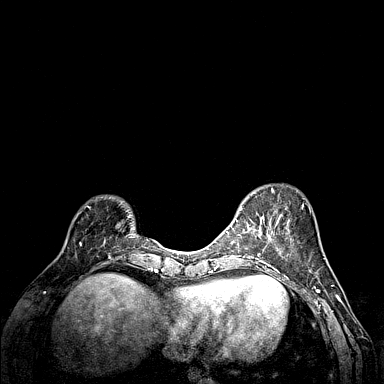
[im 80/160]
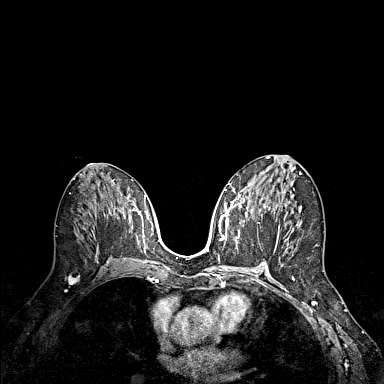
[im 120/160]
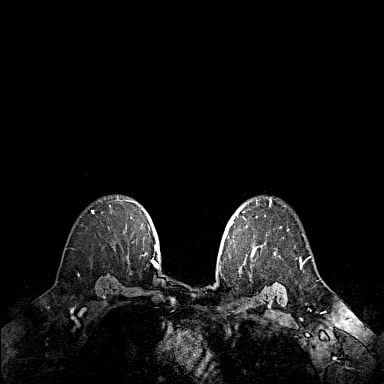
[im 160/160]
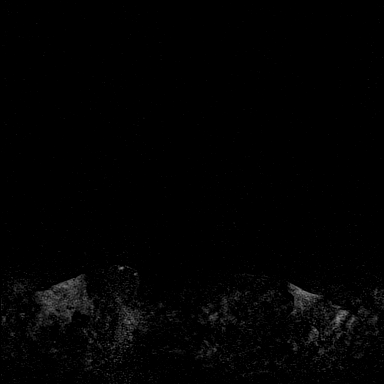

[Series 9: fl3d post-cm 3min_sub · axial · 1.2mm · 0.94mm/px · z∈[-73,+3]mm · 3 of 160 slices shown]
[im 1/160]
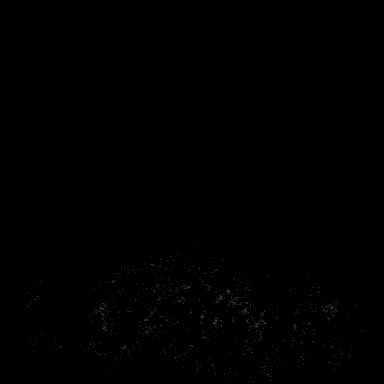
[im 32/160]
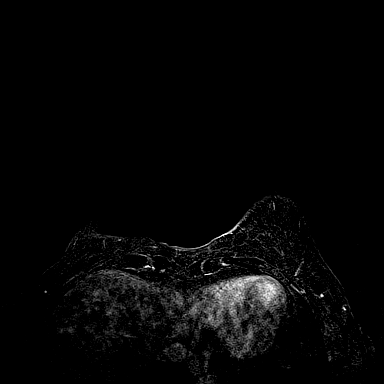
[im 64/160]
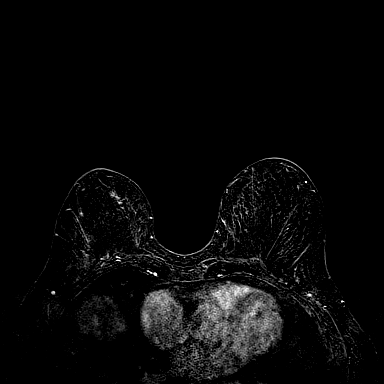

[30 of 48 positions shown; findings below may reference images not displayed]

Patient originally had ultrasound-guided core biopsy of mass in the
8:30 o'clock location of the RIGHT breast, showing grade 1 invasive
mammary carcinoma and mammary carcinoma in situ. Biopsy of an
intramammary lymph node showed metastatic carcinoma in the 10
o'clock location 8 centimeters from the nipple.

Biopsy of RIGHT axillary lymph node showed invasive mammary
carcinoma with no residual nodal tissue, consistent with a
completely replaced lymph node. Patient had subsequent MRI which
demonstrated a large area of enhancement involving the LOWER OUTER
and LOWER INNER quadrants of the RIGHT breast, spanning up to
centimeters. MRI guided core biopsy was performed of the anterior
aspect of non mass enhancement in the LOWER INNER QUADRANT of the
RIGHT breast which showed grade 1 invasive mammary carcinoma. Clip
did not deploy at the time of MR guided core biopsy.

LABS:  None obtained at the time of imaging.

EXAM:
BILATERAL BREAST MRI WITH AND WITHOUT CONTRAST
Three-dimensional MR images were rendered by post-processing of the
original MR data on an independent workstation. The
three-dimensional MR images were interpreted, and findings are
reported in the following complete MRI report for this study. Three
dimensional images were evaluated at the independent interpreting
workstation using the DynaCAD thin client.
FINDINGS: Breast composition: c. Heterogeneous fibroglandular tissue.

Background parenchymal enhancement: Minimal

Right breast: Previously biopsied intramammary lymph node in the 10
o'clock location of the RIGHT breast now measures 1.4 centimeters on
image 77 of series 6. Previously, node measured 1.8 centimeters.
Tissue marker clip is present within the lymph node following biopsy
which showed metastatic carcinoma.

Tissue marker clip is identified in the LATERAL portion of the RIGHT
breast, at the site of biopsied mass in the 8:30 o'clock location.
There is no significant residual enhancement in this region.

There is heterogeneous non mass enhancement throughout the LOWER
OUTER and extending into LOWER INNER quadrants of the RIGHT breast.
This area of enhancement currently measures 8.6 x 2.5 x 2.0 and
shows delayed wash-in and persistent type kinetics. Previously, this
area of enhancement measured 8.7 x 2.8 x 2.8 centimeters and showed
more homogeneous rapid persistent type enhancement. Although the
size of the non mass enhancement is similar, there is significantly
less homogeneous enhancement throughout this region compared to
prior study. There is no evidence for enhancement of the pectoralis
muscle on today's exam.

No new enhancement of the RIGHT breast.

Left breast: No mass or abnormal enhancement.

Lymph nodes: RIGHT axillary lymph node nausea is cortical thickening
of 7 millimeters, previously 15 millimeters. No enlarged internal
mammary or infraclavicular lymph nodes identified.

Ancillary findings:  None.
IMPRESSION: 1. Persistent large area of non mass enhancement in the LOWER OUTER
and LOWER INNER quadrants of the RIGHT breast, but with more
heterogeneous internal enhancement compared to prior study.
Enhancement measures 8.6 x 2.5 x 2.0 centimeters.
2. No evidence for involvement of the pectoralis muscle.
3. Smaller intramammary lymph node in the 10 o'clock location of the
RIGHT breast, now 1.4 centimeters.
4. Smaller RIGHT axillary lymph node, now with cortical thickness of
7 millimeters.
5. No new suspicious findings.

RECOMMENDATION:
Treatment plan for known RIGHT breast malignancy.

BI-RADS CATEGORY  6: Known biopsy-proven malignancy.

## 2019-12-31 MED ORDER — GADOBUTROL 1 MMOL/ML IV SOLN
6.0000 mL | Freq: Once | INTRAVENOUS | Status: AC | PRN
  Administered 2019-12-31: 6 mL via INTRAVENOUS

## 2019-12-31 NOTE — Progress Notes (Signed)
Patient Care Team: Patient, No Pcp Per as PCP - General (General Practice) Mauro Kaufmann, RN as Medical Oncologist Rockwell Germany, RN as Oncology Nurse Navigator  DIAGNOSIS:    ICD-10-CM   1. Malignant neoplasm of lower-outer quadrant of right breast of female, estrogen receptor positive ([place])  C50.511    Z17.0     SUMMARY OF ONCOLOGIC HISTORY: Oncology History  Malignant neoplasm of lower-outer quadrant of right breast of female, estrogen receptor positive ([place])  08/01/2019 Initial Diagnosis   Palpable right breast mass for 1 month.  Mammogram revealed a 5 cm mass 8:30 position, 3 cm intramammary lymph node at 10 o'clock position and 2 enlarged right axillary lymph nodes.  Biopsy revealed grade 1 IDC ER 90%, PR 10%, Ki-67 10%, HER-2 negative.  Intramammary node and axillary lymph node both positive with similar prognostic profile   08/08/2019 -  Neo-Adjuvant Anti-estrogen oral therapy   Anastrozole while deciding treatment plan      CHIEF COMPLIANT: Follow-up of right breast cancer on anastrozole   INTERVAL HISTORY: [name] is a 64 y.o. with above-mentioned history of right breast cancercurrently on neoadjuvant antiestrogen therapy with anastrozole.Breast MRI on 12/31/19 showed a persistent area of non mass enhancement in the right breast, with more internal enhancement measuring 8.6cm, and decrease in the size of the right intramammary and axillary lymph nodes. She presents to the clinic today for follow-up.   ALLERGIES:  has No Known Allergies.  MEDICATIONS:  Current Outpatient Medications  Medication Sig Dispense Refill  . anastrozole (ARIMIDEX) 1 MG tablet Take 1 tablet (1 mg total) by mouth daily. 90 tablet 3  . levothyroxine (SYNTHROID) 125 MCG tablet Take 1 tablet (125 mcg total) by mouth daily before breakfast.    . rosuvastatin (CRESTOR) 10 MG tablet Take 1 tablet (10 mg total) by mouth daily.     No current facility-administered medications for this  visit.    PHYSICAL EXAMINATION: ECOG PERFORMANCE STATUS: 1 - Symptomatic but completely ambulatory  Vitals:   01/01/20 1347  BP: 134/83  Pulse: 95  Resp: 20  Temp: 97.7 F (36.5 C)  SpO2: 97%   Filed Weights   01/01/20 1347  Weight: 149 lb 4.8 oz (67.7 kg)     LABORATORY DATA:  I have reviewed the data as listed CMP Latest Ref Rng & Units 08/23/2019 06/12/2007  Glucose 70 - 99 mg/dL 122(H) 111(H)  BUN 8 - 23 mg/dL 13 10  Creatinine 0.44 - 1.00 mg/dL 0.83 0.72  Sodium 135 - 145 mmol/L 143 137  Potassium 3.5 - 5.1 mmol/L 4.4 3.7  Chloride 98 - 111 mmol/L 105 105  CO2 22 - 32 mmol/L 27 26  Calcium 8.9 - 10.3 mg/dL 9.9 8.9  Total Protein 6.5 - 8.1 g/dL 7.9 -  Total Bilirubin 0.3 - 1.2 mg/dL 0.5 -  Alkaline Phos 38 - 126 U/L 66 -  AST 15 - 41 U/L 16 -  ALT 0 - 44 U/L 16 -    Lab Results  Component Value Date   WBC 7.8 06/12/2007   HGB 13.4 06/12/2007   HCT 38.0 06/12/2007   MCV 90.4 06/12/2007   PLT 314 06/12/2007   NEUTROABS 5.4 06/12/2007    ASSESSMENT & PLAN:  Malignant neoplasm of lower-outer quadrant of right breast of female, estrogen receptor positive ([place]) 08/01/2019:Palpable right breast mass for 1 month. Mammogram revealed a 5 cm mass 8:30 position, 3 cm intramammary lymph node at 10 o'clock position and 2 enlarged  right axillary lymph nodes. Biopsy revealed grade 1 IDC ER 90%, PR 10%, Ki-67 10%, HER-2 negative. Intramammary node and axillary lymph node both positive with similar prognostic profile  Breast MRI 08/15/2019: Biopsy-proven malignancy lower outer quadrant right breast, non-mass enhancement spanning 8.7 cm with probable invasion of underlying pectoral muscle. Biopsy-proven metastatic lymph node in the right axillary tail. Mild asymmetric right infraclavicular adenopathy.Biopsy positive for invasive ductal carcinoma grade 1  CT CAP 08/24/2019: Right axillary and right retropectoral adenopathy compatible with nodal metastases. Indistinct  sclerotic 1.2 cm left acetabular lesion. No other distant metastases  Current treatment: 1. Neoadjuvant antiestrogen therapy with anastrozole 1 mg daily started 08/08/2019 2. bilateral mastectomies 3.  Adj radiation  Anastrozole toxicities:Denies any side effects to anastrozole therapy.  MammaPrint: Luminal type A, low risk, no benefit to chemotherapy Bone scan: Negative for metastatic disease  Breast MRI 12/31/2019: Persistent large non-mass enhancement lower outer and lower outer quadrants right breast 8.6 x 2.4 x 2 cm. No evidence of involvement of pectoralis muscle. Small intramammary lymph node 1.4 cm, smaller right axillary lymph nodes.  I reviewed the results of the breast MRI and discussed the treatment implications. Return to clinic after bilateral mastectomies to discuss results.     No orders of the defined types were placed in this encounter.  The patient has a good understanding of the overall plan. she agrees with it. she will call with any problems that may develop before the next visit here.  Total time spent: 30 mins including face to face time and time spent for planning, charting and coordination of care  [name], MD 01/01/2020  I, Cloyde Reams Dorshimer, am acting as scribe for Dr. [name].  I have reviewed the above documentation for accuracy and completeness, and I agree with the above.

## 2020-01-01 ENCOUNTER — Encounter: Payer: Self-pay | Admitting: *Deleted

## 2020-01-01 ENCOUNTER — Inpatient Hospital Stay: Payer: Commercial Managed Care - PPO | Attending: Hematology and Oncology | Admitting: Hematology and Oncology

## 2020-01-01 ENCOUNTER — Other Ambulatory Visit: Payer: Self-pay

## 2020-01-01 DIAGNOSIS — C50511 Malignant neoplasm of lower-outer quadrant of right female breast: Secondary | ICD-10-CM | POA: Insufficient documentation

## 2020-01-01 DIAGNOSIS — Z170 Estrogen receptor positive status [ER+]: Secondary | ICD-10-CM | POA: Diagnosis not present

## 2020-01-01 DIAGNOSIS — C773 Secondary and unspecified malignant neoplasm of axilla and upper limb lymph nodes: Secondary | ICD-10-CM | POA: Diagnosis not present

## 2020-01-01 DIAGNOSIS — Z9013 Acquired absence of bilateral breasts and nipples: Secondary | ICD-10-CM | POA: Diagnosis not present

## 2020-01-01 DIAGNOSIS — Z923 Personal history of irradiation: Secondary | ICD-10-CM | POA: Insufficient documentation

## 2020-01-01 NOTE — Assessment & Plan Note (Signed)
08/01/2019:Palpable right breast mass for 1 month. Mammogram revealed a 5 cm mass 8:30 position, 3 cm intramammary lymph node at 10 o'clock position and 2 enlarged right axillary lymph nodes. Biopsy revealed grade 1 IDC ER 90%, PR 10%, Ki-67 10%, HER-2 negative. Intramammary node and axillary lymph node both positive with similar prognostic profile  Breast MRI 08/15/2019: Biopsy-proven malignancy lower outer quadrant right breast, non-mass enhancement spanning 8.7 cm with probable invasion of underlying pectoral muscle. Biopsy-proven metastatic lymph node in the right axillary tail. Mild asymmetric right infraclavicular adenopathy.Biopsy positive for invasive ductal carcinoma grade 1  CT CAP 08/24/2019: Right axillary and right retropectoral adenopathy compatible with nodal metastases. Indistinct sclerotic 1.2 cm left acetabular lesion. No other distant metastases  Current treatment: 1. Neoadjuvant antiestrogen therapy with anastrozole 1 mg daily started 08/08/2019 2. bilateral mastectomies 3. Plus or minus radiation  Anastrozole toxicities:Denies any side effects to anastrozole therapy.  MammaPrint: Luminal type A, low risk, no benefit to chemotherapy Bone scan: Negative for metastatic disease  Breast MRI 12/31/2019: Persistent large non-mass enhancement lower outer and lower outer quadrants right breast 8.6 x 2.4 x 2 cm. No evidence of involvement of pectoralis muscle. Small intramammary lymph node 1.4 cm, smaller right axillary lymph nodes.  I reviewed the results of the breast MRI and discussed the treatment implications. Return to clinic after bilateral mastectomies to discuss results.

## 2020-01-02 ENCOUNTER — Ambulatory Visit: Payer: Self-pay | Admitting: General Surgery

## 2020-01-02 DIAGNOSIS — Z170 Estrogen receptor positive status [ER+]: Secondary | ICD-10-CM

## 2020-01-02 DIAGNOSIS — C50511 Malignant neoplasm of lower-outer quadrant of right female breast: Secondary | ICD-10-CM

## 2020-01-04 ENCOUNTER — Telehealth: Payer: Self-pay | Admitting: Hematology and Oncology

## 2020-01-04 NOTE — Telephone Encounter (Signed)
No 11/16 los, no changes made to pt schedule

## 2020-01-07 ENCOUNTER — Other Ambulatory Visit (HOSPITAL_COMMUNITY): Payer: Commercial Managed Care - PPO

## 2020-01-07 ENCOUNTER — Other Ambulatory Visit: Payer: Self-pay | Admitting: General Surgery

## 2020-01-07 DIAGNOSIS — Z170 Estrogen receptor positive status [ER+]: Secondary | ICD-10-CM

## 2020-01-07 DIAGNOSIS — C50511 Malignant neoplasm of lower-outer quadrant of right female breast: Secondary | ICD-10-CM

## 2020-01-08 ENCOUNTER — Encounter: Payer: Self-pay | Admitting: *Deleted

## 2020-01-08 DIAGNOSIS — Z170 Estrogen receptor positive status [ER+]: Secondary | ICD-10-CM

## 2020-01-08 DIAGNOSIS — C50511 Malignant neoplasm of lower-outer quadrant of right female breast: Secondary | ICD-10-CM

## 2020-01-16 DIAGNOSIS — C50919 Malignant neoplasm of unspecified site of unspecified female breast: Secondary | ICD-10-CM

## 2020-01-16 HISTORY — DX: Malignant neoplasm of unspecified site of unspecified female breast: C50.919

## 2020-01-17 ENCOUNTER — Encounter: Payer: Self-pay | Admitting: *Deleted

## 2020-01-24 ENCOUNTER — Encounter: Payer: Self-pay | Admitting: *Deleted

## 2020-02-22 NOTE — Pre-Procedure Instructions (Signed)
[address], [place] [address] Phone: [phone] Fax: [phone]     Your procedure is scheduled on Thursday 02/28/20.  Report to [facility] Main Entrance "A" at 06:30 A.M., and check in at the Admitting office.  Call this number if you have problems the morning of surgery:  [phone]  Call [phone] if you have any questions prior to your surgery date Monday-Friday 8am-4pm.    Remember:  Do not eat after midnight the night before your surgery.  You may drink clear liquids until 05:30 AM the morning of your surgery.   Clear liquids allowed are: Water, Non-Citrus Juices (without pulp), Carbonated Beverages, Clear Tea, Black Coffee Only, and Gatorade.    Take these medicines the morning of surgery with A SIP OF WATER: anastrozole (ARIMIDEX) levothyroxine (SYNTHROID)  rosuvastatin (CRESTOR)   As of today, STOP taking any Aspirin (unless otherwise instructed by your surgeon) Aleve, Naproxen, Ibuprofen, Motrin, Advil, Goody's, BC's, all herbal medications, fish oil, and all vitamins.                      Do not wear jewelry, make up, or nail polish.            Do not wear lotions, powders, perfumes, or deodorant.            Do not shave 48 hours prior to surgery.              Do not bring valuables to the hospital.            [facility] is not responsible for any belongings or valuables.  Do NOT Smoke (Tobacco/Vaping) or drink Alcohol 24 hours prior to your procedure. If you use a CPAP at night, you may bring all equipment for your overnight stay.   Contacts, glasses, dentures or bridgework may not be worn into surgery.      For patients admitted to the hospital, discharge time will be determined by your treatment team.   Patients discharged the day of surgery will not be allowed to drive home, and someone needs to stay with them for 24 hours.    Special instructions:   [facility]- Preparing For Surgery  Before surgery, you can play an important role. Because skin is not sterile, your skin needs to be as free of germs as possible. You can reduce the number of germs on your skin by washing with CHG (chlorahexidine gluconate) Soap before surgery.  CHG is an antiseptic cleaner which kills germs and bonds with the skin to continue killing germs even after washing.    Oral Hygiene is also important to reduce your risk of infection.  Remember - BRUSH YOUR TEETH THE MORNING OF SURGERY WITH YOUR REGULAR TOOTHPASTE  Please do not use if you have an allergy to CHG or antibacterial soaps. If your skin becomes reddened/irritated stop using the CHG.  Do not shave (including legs and underarms) for at least 48 hours prior to first CHG shower. It is OK to shave your face.  Please follow these instructions carefully.   1. Shower the NIGHT BEFORE SURGERY and the MORNING OF SURGERY with CHG Soap.   2. If you chose to wash your hair, wash your hair first as usual with your normal shampoo.  3. After you shampoo, rinse your hair and body thoroughly to remove the shampoo.  4. Use CHG as you would any other  liquid soap. You can apply CHG directly to the skin and wash gently with a scrungie or a clean washcloth.   5. Apply the CHG Soap to your body ONLY FROM THE NECK DOWN.  Do not use on open wounds or open sores. Avoid contact with your eyes, ears, mouth and genitals (private parts). Wash Face and genitals (private parts)  with your normal soap.   6. Wash thoroughly, paying special attention to the area where your surgery will be performed.  7. Thoroughly rinse your body with warm water from the neck down.  8. DO NOT shower/wash with your normal soap after using and rinsing off the CHG Soap.  9. Pat yourself dry with a CLEAN TOWEL.  10. Wear CLEAN PAJAMAS to bed the night before surgery  11. Place CLEAN SHEETS on your bed the night of your first shower and DO NOT SLEEP WITH  PETS.   Day of Surgery: Wear Clean/Comfortable clothing the morning of surgery Do not apply any deodorants/lotions.   Remember to brush your teeth WITH YOUR REGULAR TOOTHPASTE.   Please read over the following fact sheets that you were given.

## 2020-02-25 ENCOUNTER — Other Ambulatory Visit (HOSPITAL_COMMUNITY)
Admission: RE | Admit: 2020-02-25 | Discharge: 2020-02-25 | Disposition: A | Discharge status: Home / Self Care | Payer: Commercial Managed Care - PPO | Source: Ambulatory Visit | Attending: General Surgery | Admitting: General Surgery

## 2020-02-25 ENCOUNTER — Other Ambulatory Visit: Payer: Self-pay

## 2020-02-25 ENCOUNTER — Encounter (HOSPITAL_COMMUNITY)
Admission: RE | Admit: 2020-02-25 | Discharge: 2020-02-25 | Disposition: A | Discharge status: Home / Self Care | Payer: Commercial Managed Care - PPO | Source: Ambulatory Visit | Attending: General Surgery | Admitting: General Surgery

## 2020-02-25 ENCOUNTER — Encounter (HOSPITAL_COMMUNITY): Payer: Self-pay

## 2020-02-25 DIAGNOSIS — Z01812 Encounter for preprocedural laboratory examination: Secondary | ICD-10-CM | POA: Diagnosis present

## 2020-02-25 DIAGNOSIS — Z20822 Contact with and (suspected) exposure to covid-19: Secondary | ICD-10-CM | POA: Insufficient documentation

## 2020-02-25 DIAGNOSIS — C50911 Malignant neoplasm of unspecified site of right female breast: Secondary | ICD-10-CM | POA: Insufficient documentation

## 2020-02-25 DIAGNOSIS — E785 Hyperlipidemia, unspecified: Secondary | ICD-10-CM | POA: Insufficient documentation

## 2020-02-25 DIAGNOSIS — Z85850 Personal history of malignant neoplasm of thyroid: Secondary | ICD-10-CM | POA: Insufficient documentation

## 2020-02-25 DIAGNOSIS — E039 Hypothyroidism, unspecified: Secondary | ICD-10-CM | POA: Insufficient documentation

## 2020-02-25 DIAGNOSIS — Z79899 Other long term (current) drug therapy: Secondary | ICD-10-CM | POA: Insufficient documentation

## 2020-02-25 HISTORY — DX: Pneumonia, unspecified organism: J18.9

## 2020-02-25 HISTORY — DX: Hyperlipidemia, unspecified: E78.5

## 2020-02-25 HISTORY — DX: Hypothyroidism, unspecified: E03.9

## 2020-02-25 HISTORY — DX: Malignant neoplasm of thyroid gland: C73

## 2020-02-25 LAB — CBC
HCT: 41 % (ref 36.0–46.0)
Hemoglobin: 13.4 g/dL (ref 12.0–15.0)
MCH: 29.7 pg (ref 26.0–34.0)
MCHC: 32.7 g/dL (ref 30.0–36.0)
MCV: 90.9 fL (ref 80.0–100.0)
Platelets: 260 10*3/uL (ref 150–400)
RBC: 4.51 MIL/uL (ref 3.87–5.11)
RDW: 11.9 % (ref 11.5–15.5)
WBC: 7.3 10*3/uL (ref 4.0–10.5)
nRBC: 0 % (ref 0.0–0.2)

## 2020-02-25 LAB — BASIC METABOLIC PANEL
Anion gap: 9 (ref 5–15)
BUN: 14 mg/dL (ref 8–23)
CO2: 28 mmol/L (ref 22–32)
Calcium: 9.5 mg/dL (ref 8.9–10.3)
Chloride: 103 mmol/L (ref 98–111)
Creatinine, Ser: 0.7 mg/dL (ref 0.44–1.00)
GFR, Estimated: >60 mL/min (ref >60)
Glucose, Bld: 111 mg/dL — ABNORMAL HIGH (ref 70–99)
Potassium: 3.8 mmol/L (ref 3.5–5.1)
Sodium: 140 mmol/L (ref 135–145)

## 2020-02-25 LAB — SARS CORONAVIRUS 2 (TAT 6-24 HRS): SARS Coronavirus 2: NEGATIVE

## 2020-02-25 NOTE — Progress Notes (Addendum)
PCP - Deland Pretty Cardiologist - denies  PPM/ICD - denies   Chest x-ray - n/a EKG - n/a Stress Test - denies ECHO - denies Cardiac Cath - denies  Sleep Study - denies   Patient instructed to hold all Aspirin, NSAID's, herbal medications, fish oil and vitamins 7 days prior to surgery.   ERAS Protcol -yes   COVID TEST- completed 02/25/20   Anesthesia review: yes, scheduled for radioactive seed implantation  Patient denies shortness of breath, fever, cough and chest pain at PAT appointment   All instructions explained to the patient, with a verbal understanding of the material. Patient agrees to go over the instructions while at home for a better understanding. Patient also instructed to self quarantine after being tested for COVID-19. The opportunity to ask questions was provided.

## 2020-02-26 NOTE — Progress Notes (Signed)
Anesthesia Chart Review:  Case: [id number] Date/Time: 02/28/20 0815   Procedure: BILATERAL MASTECTOMY WITH RIGHT SENTINEL LYMPH NODE MAPPING AND RIGHT TARGETED RADIOACTIVE SEED GUIDED  LYMPH NODE DISSECTION (Bilateral Breast) - PEC BLOCK, RNFA (SHARON HITCHCOCK)   Anesthesia type: General   Pre-op diagnosis: RIGHT BREAST CANCER   Location: [facility] ROOM 02 / [facility]   Surgeons: Jovita Kussmaul, MD    Special Needs include pectoral block, BCG 02/27/19 at 1:15 PM, Nuc Med 02/28/19 at 8:00 AM, ERAS   DISCUSSION: Patient is a 65 year old female scheduled for the above procedure.  History includes never smoker, right breast cancer (diagnosed 07/2019, an Anastrozole while deciding treatment plan), papillary thyroid cancer (s/p right thyroid lobectomy 06/14/07), hypothyroidism, dyslipidemia.   02/25/2020 presurgical COVID-19 test negative.  Anesthesia team to evaluate on the day of surgery.   VS: BP (!) 145/87   Pulse 78   Temp 36.7 C (Oral)   Resp 18   Ht 5\' 7"  (1.702 m)   Wt 69.2 kg   SpO2 100%   BMI 23.88 kg/m  Postmenopausal.    PROVIDERS: Deland Pretty, MD is PCP  [name], MD is HEM-ONC   LABS: Labs reviewed: Acceptable for surgery. (all labs ordered are listed, but only abnormal results are displayed)  Labs Reviewed  BASIC METABOLIC PANEL - Abnormal; Notable for the following components:      Result Value   Glucose, Bld 111 (*)    All other components within normal limits  CBC    IMAGES: CT Chest 08/24/19: IMPRESSION: 1. Right axillary and right retropectoral lymphadenopathy compatible with nodal metastases. 2. Indistinct sclerotic 1.2 cm anterior left acetabular lesion, indeterminate for osseous metastasis. Correlation with bone scan or PET-CT may be obtained as clinically warranted. 3. No additional potential sites of metastatic disease in the chest, abdomen or pelvis. 4. Aortic Atherosclerosis (ICD10-I70.0).  Bone Scan 09/07/19: IMPRESSION: 1. No focal scintigraphic  uptake within the left acetabulum to correspond to the previously seen sclerotic bone lesion from CT 08/24/2019. In correlation with the previous CT, findings favor the lesion to represent a benign fibro-osseous lesion or enostosis. 2. No suspicious areas of focal radiotracer uptake to suggest osseous metastatic disease.   EKG: N/A   CV: N/A  Past Medical History:  Diagnosis Date  . Dyslipidemia   . Hypothyroidism   . Pneumonia   . Thyroid cancer [facility])     Past Surgical History:  Procedure Laterality Date  . THYROIDECTOMY      MEDICATIONS: . anastrozole (ARIMIDEX) 1 MG tablet  . calcium carbonate (OS-CAL) 1250 (500 Ca) MG chewable tablet  . cholecalciferol (VITAMIN D3) 25 MCG (1000 UNIT) tablet  . levothyroxine (SYNTHROID) 125 MCG tablet  . Multiple Vitamins-Minerals (MULTIVITAMIN WITH MINERALS) tablet  . rosuvastatin (CRESTOR) 10 MG tablet   No current facility-administered medications for this encounter.    Myra Gianotti, PA-C Surgical Short Stay/Anesthesiology [facility] [place] Phone [phone] [facility] Phone [phone] 02/26/2020 10:31 AM

## 2020-02-26 NOTE — Anesthesia Preprocedure Evaluation (Addendum)
Anesthesia Evaluation  Patient identified by MRN, date of birth, ID band Patient awake    Reviewed: Allergy & Precautions, NPO status , Patient's Chart, lab work & pertinent test results  Airway Mallampati: II  TM Distance: >3 FB Neck ROM: Full    Dental no notable dental hx. (+) Teeth Intact   Pulmonary pneumonia, resolved,    Pulmonary exam normal breath sounds clear to auscultation       Cardiovascular negative cardio ROS Normal cardiovascular exam Rhythm:Regular Rate:Normal     Neuro/Psych negative neurological ROS  negative psych ROS   GI/Hepatic negative GI ROS, Neg liver ROS,   Endo/Other  Hypothyroidism Hyperlipidemia Right Breast Ca Hx/o thyroid Ca  Renal/GU negative Renal ROS  negative genitourinary   Musculoskeletal   Abdominal   Peds  Hematology negative hematology ROS (+)   Anesthesia Other Findings   Reproductive/Obstetrics                            Anesthesia Physical Anesthesia Plan  ASA: II  Anesthesia Plan: General   Post-op Pain Management:  Regional for Post-op pain   Induction: Intravenous  PONV Risk Score and Plan: 4 or greater and Scopolamine patch - Pre-op, Treatment may vary due to age or medical condition, Ondansetron, Dexamethasone and Midazolam  Airway Management Planned: Oral ETT  Additional Equipment:   Intra-op Plan:   Post-operative Plan: Extubation in OR  Informed Consent: I have reviewed the patients History and Physical, chart, labs and discussed the procedure including the risks, benefits and alternatives for the proposed anesthesia with the patient or authorized representative who has indicated his/her understanding and acceptance.     Dental advisory given  Plan Discussed with: CRNA and Anesthesiologist  Anesthesia Plan Comments: (PAT note written 02/26/2020 by Myra Gianotti, PA-C.  Bilateral pectoralis block )       Anesthesia Quick Evaluation

## 2020-02-27 ENCOUNTER — Other Ambulatory Visit: Payer: Self-pay

## 2020-02-27 ENCOUNTER — Other Ambulatory Visit: Payer: Self-pay | Admitting: General Surgery

## 2020-02-27 ENCOUNTER — Ambulatory Visit
Admission: RE | Admit: 2020-02-27 | Discharge: 2020-02-27 | Disposition: A | Discharge status: Home / Self Care | Payer: Commercial Managed Care - PPO | Source: Ambulatory Visit | Attending: General Surgery | Admitting: General Surgery

## 2020-02-27 ENCOUNTER — Encounter (HOSPITAL_COMMUNITY): Payer: Self-pay | Admitting: General Surgery

## 2020-02-27 DIAGNOSIS — C50511 Malignant neoplasm of lower-outer quadrant of right female breast: Secondary | ICD-10-CM

## 2020-02-27 IMAGING — MG MM BREAST LOCALIZATION CLIP
4 series · 4 of 12 positions shown · non-contrast
Comparison: Previous exam(s).

CLINICAL DATA: Patient status post radioactive seed localization of
right axillary lymph node and right intramammary lymph node.

EXAM:
DIAGNOSTIC RIGHT MAMMOGRAM POST ULTRASOUND-GUIDED RADIOACTIVE SEED
PLACEMENT

[R MLO synth-2D (1 of 2)]
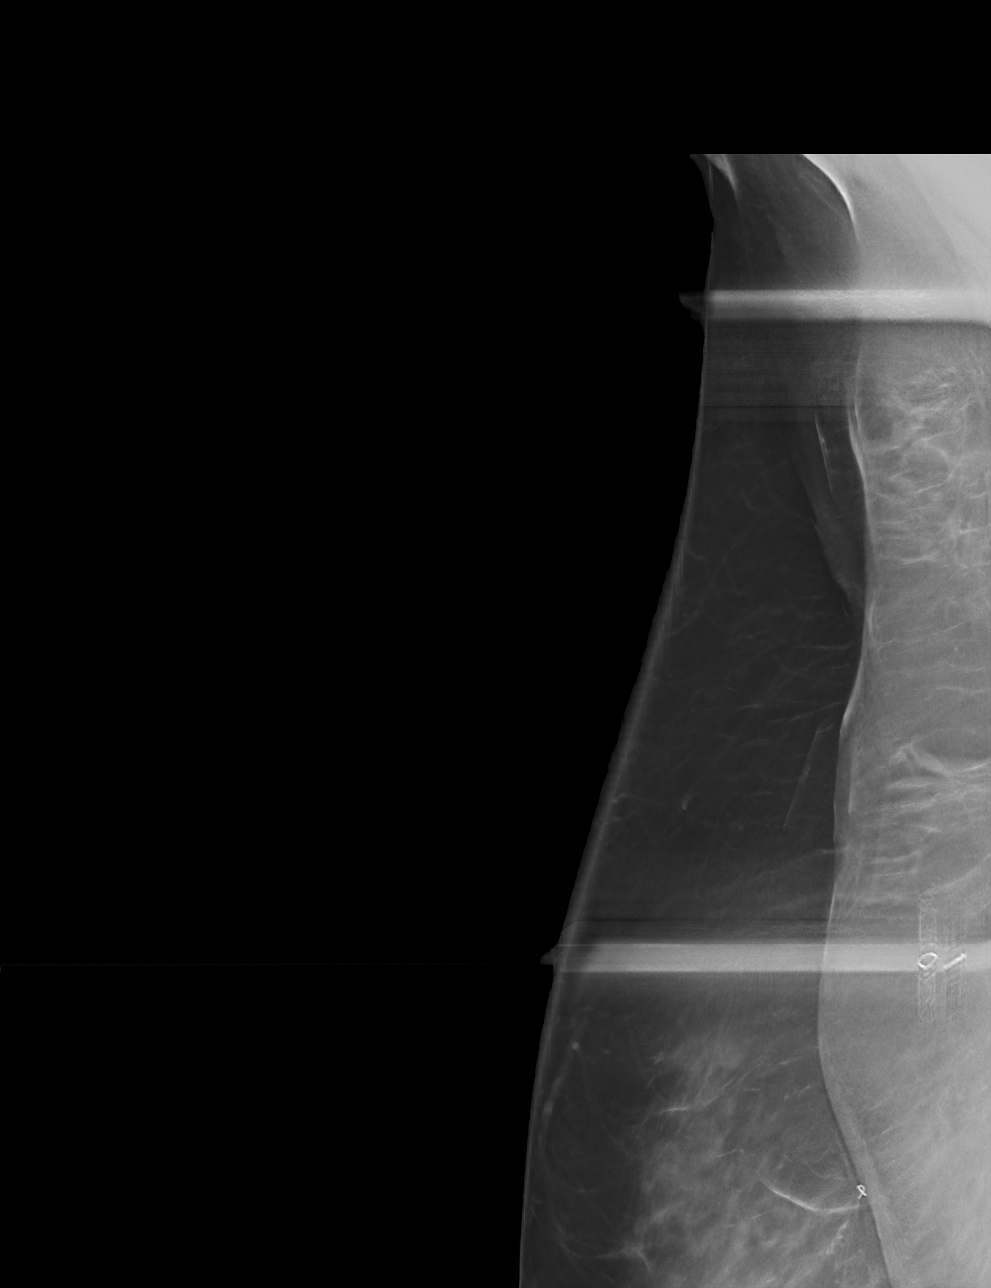

[R MLO synth-2D (2 of 2)]
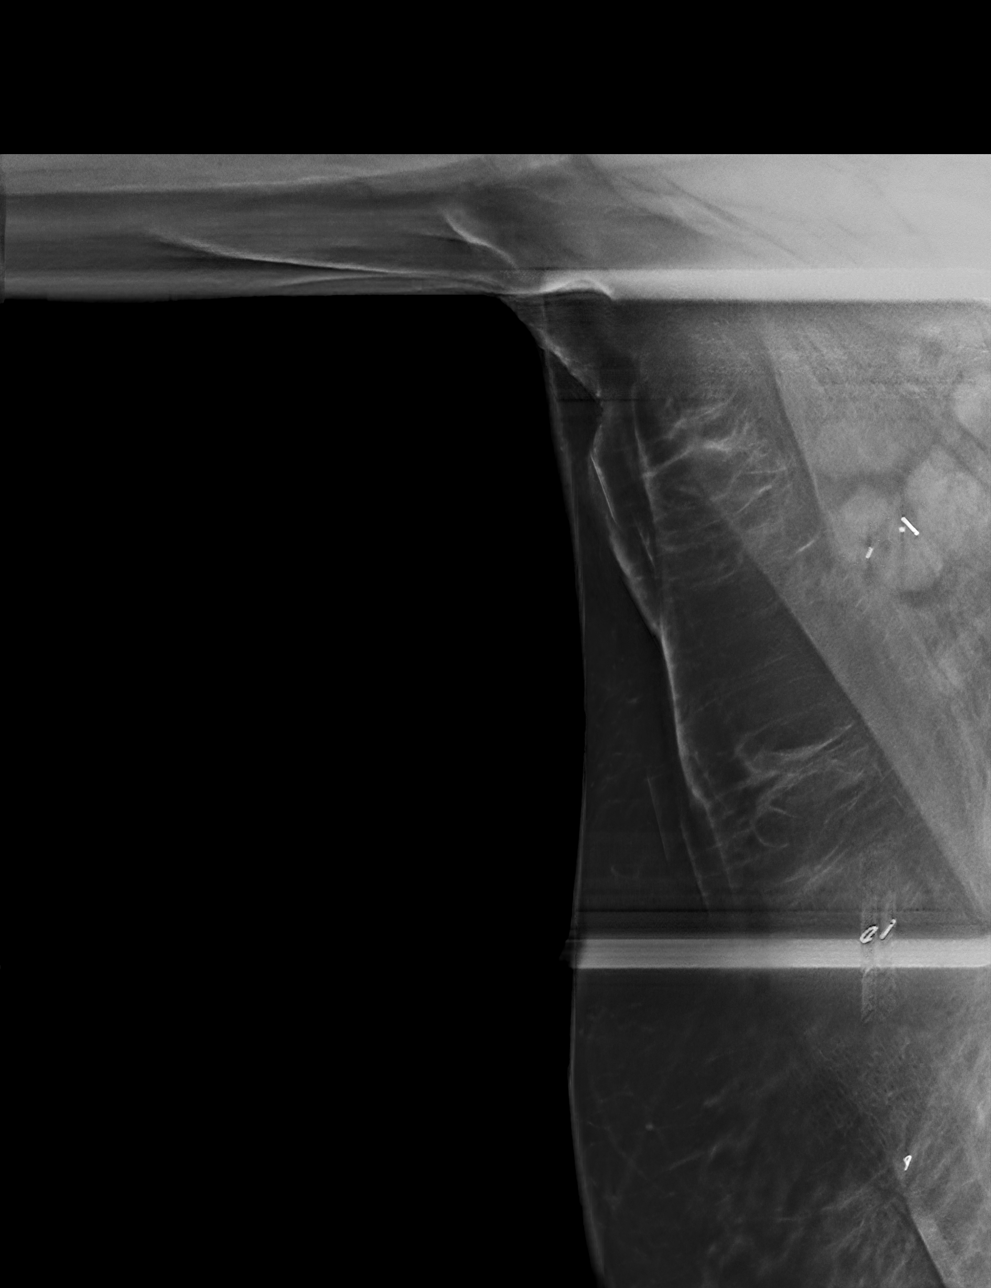

[R MLO tomo (1 of 2) · tomo slice 61/121.0]
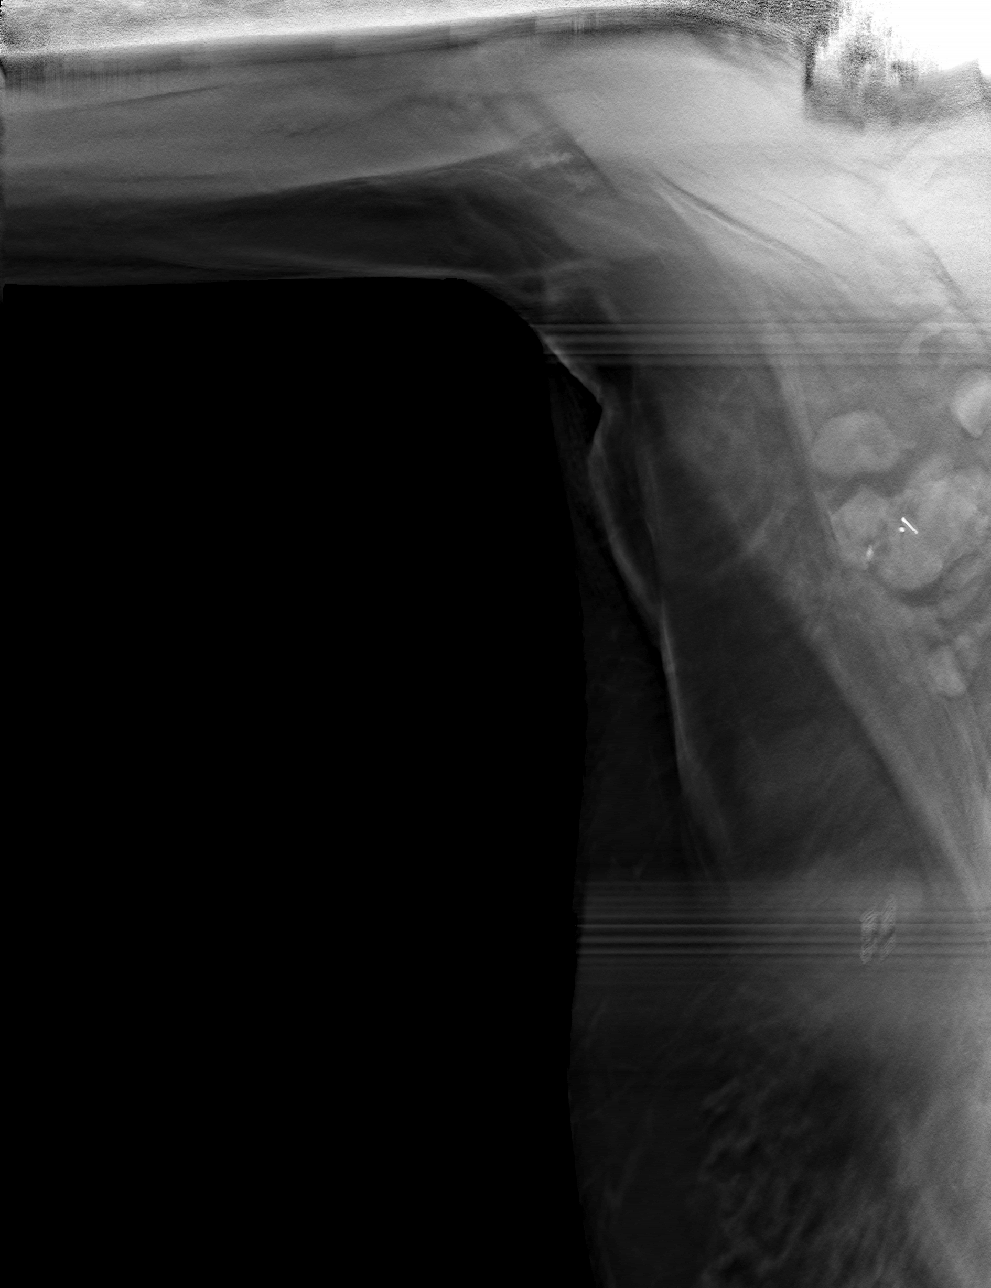

[R MLO tomo (2 of 2) · tomo slice 54/107.0]
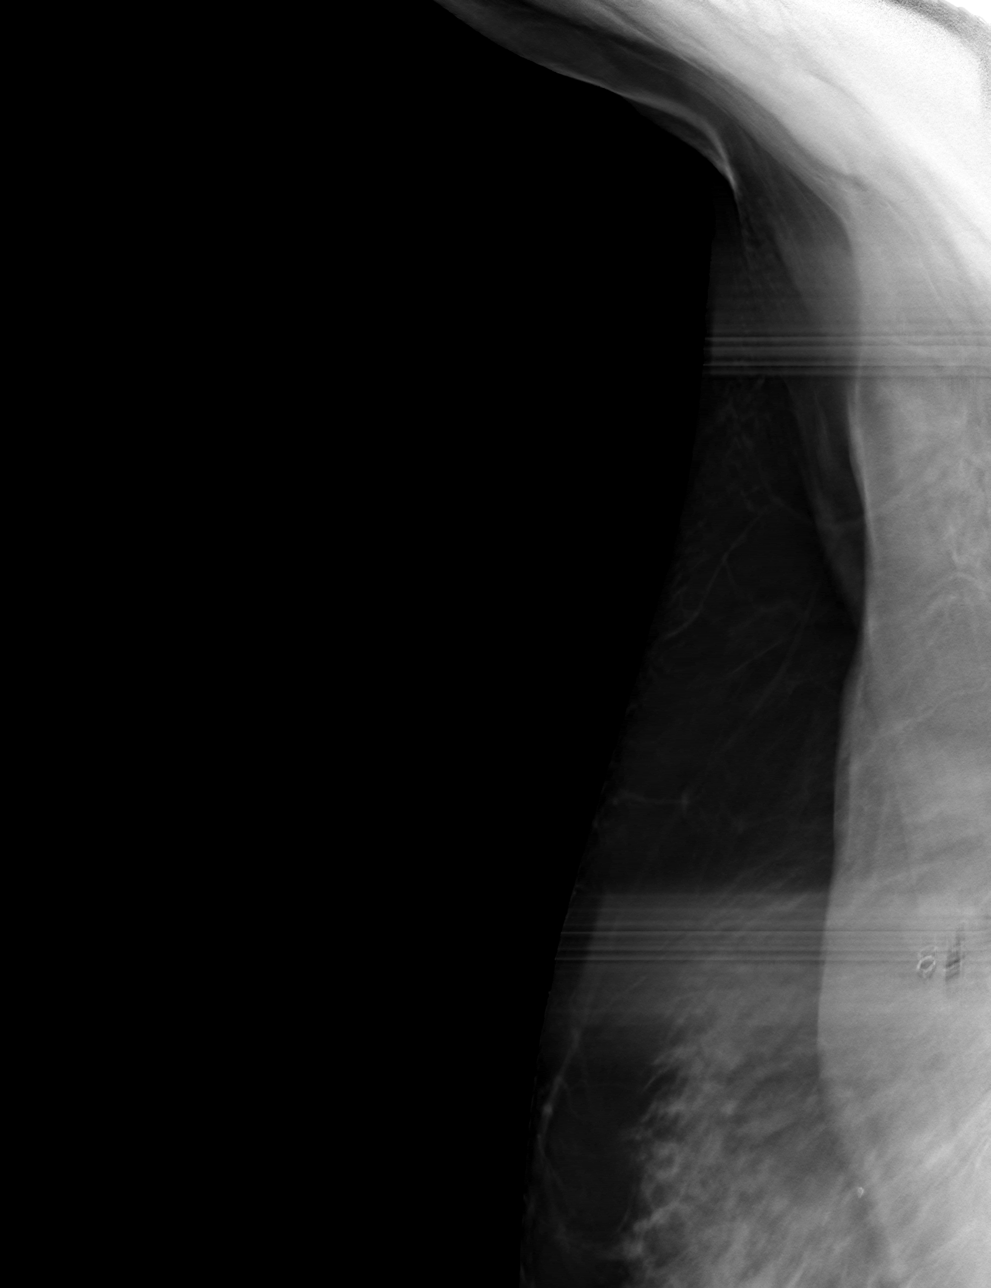

[4 of 12 positions shown; findings below may reference images not displayed]

FINDINGS: Mammographic images were obtained following ultrasound-guided
radioactive seed placement. These demonstrate the radioactive seed
to be located appropriately within the right intramammary lymph node
and the radioactive seed to be located appropriately within the
right axillary lymph node.
IMPRESSION: Appropriate location of the radioactive seeds, two sites.

Final Assessment: Post Procedure Mammograms for Seed Placement

## 2020-02-27 IMAGING — US US NEEDLE LOCALIZATION*R*
1 series · 4 of 4 positions shown · non-contrast
Comparison: Previous exam(s).

CLINICAL DATA: Patient for preoperative localization prior to
bilateral mastectomies and targeted right lymph node resection.

EXAM:
ULTRASOUND GUIDED RADIOACTIVE SEED LOCALIZATION OF THE RIGHT BREAST

[Series 1: us needle localization*right* · 0.07mm/px · 4 of 4 slices shown]
[im 1/4]
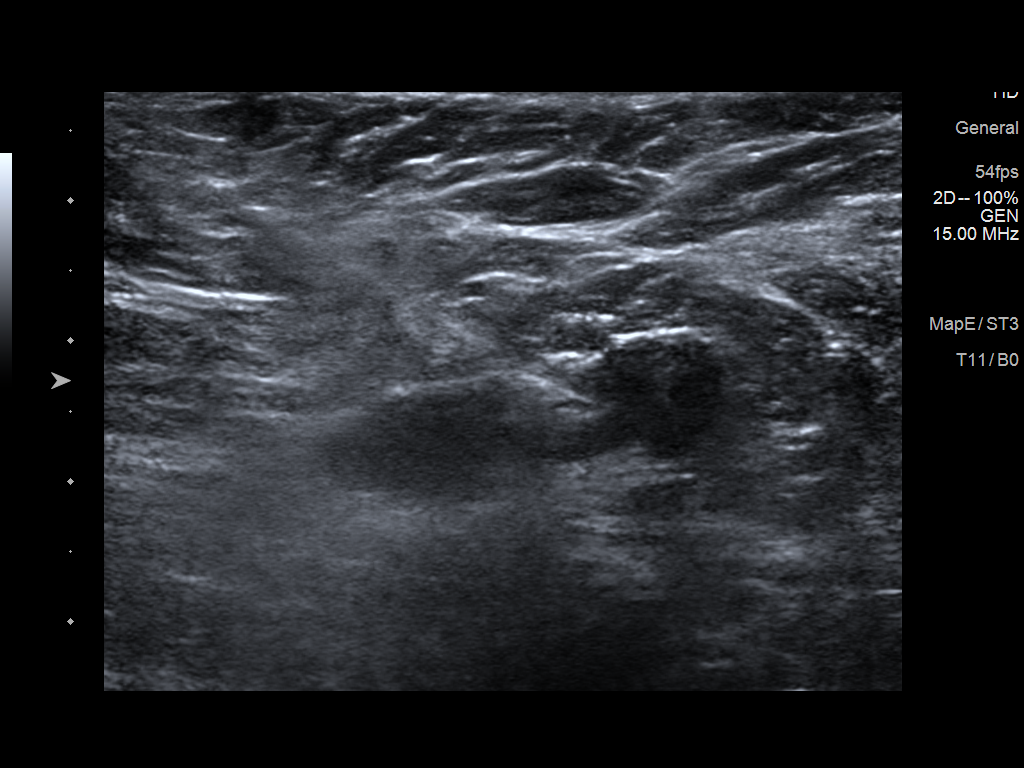
[im 2/4]
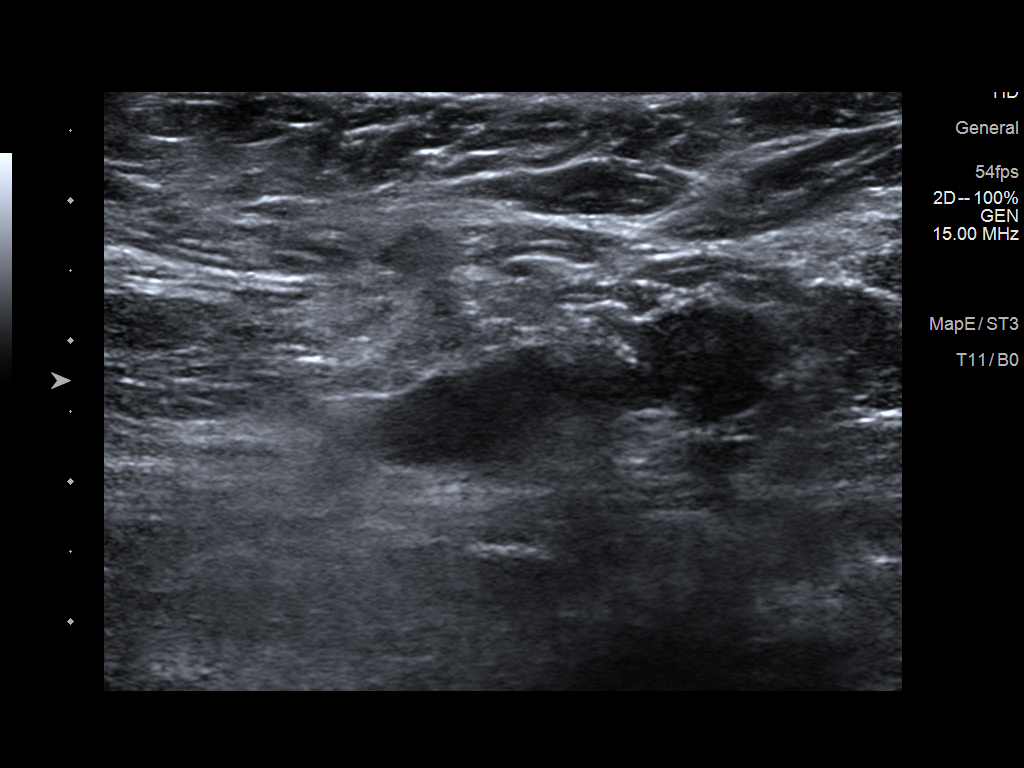
[im 3/4]
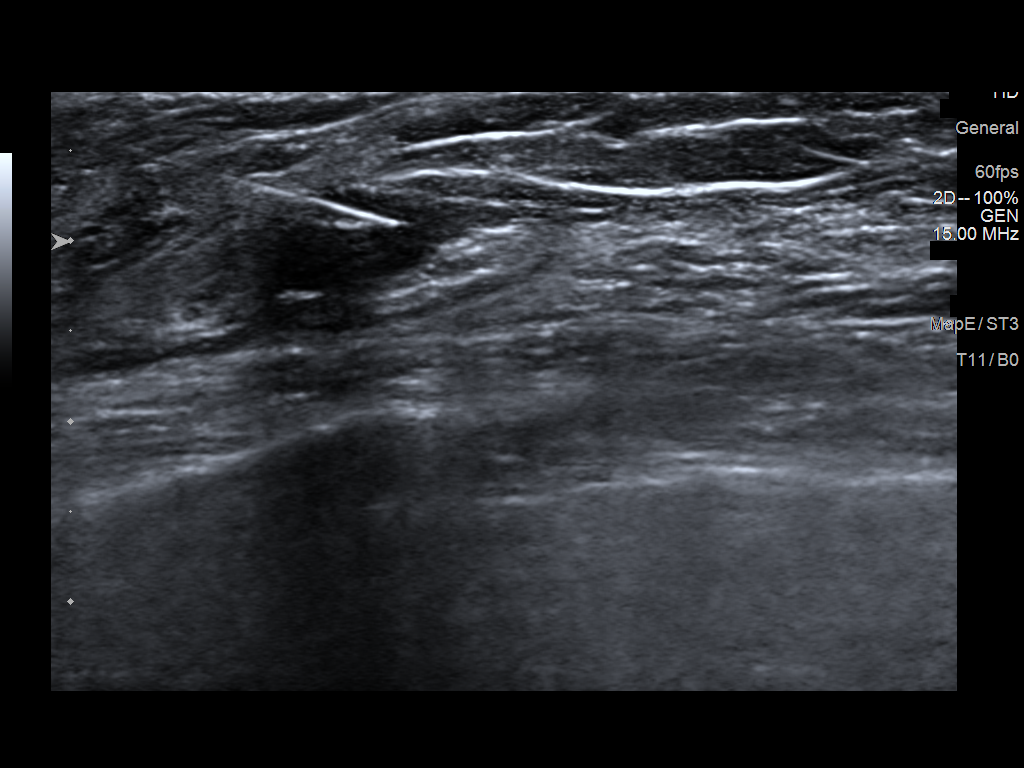
[im 4/4]
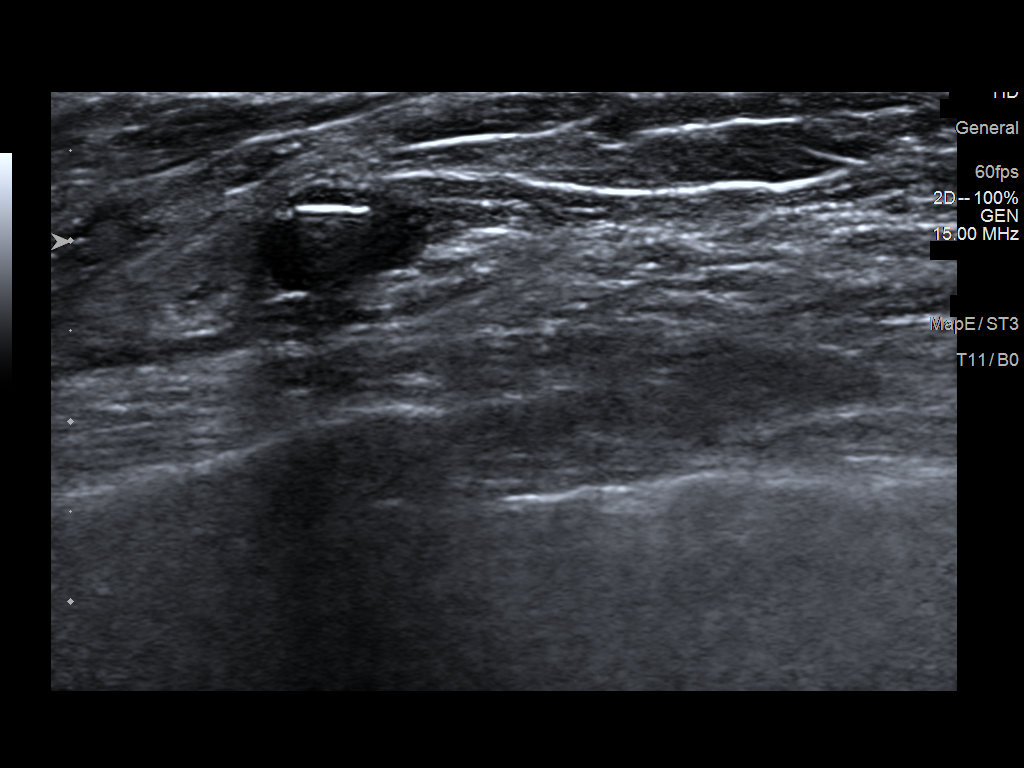

[4 of 4 positions shown; findings below may reference images not displayed]

FINDINGS: Patient presents for radioactive seed localization prior to right
targeted lymph node resection. I met with the patient and we
discussed the procedure of seed localization including benefits and
alternatives. We discussed the high likelihood of a successful
procedure. We discussed the risks of the procedure including
infection, bleeding, tissue injury and further surgery. We discussed
the low dose of radioactivity involved in the procedure. Informed,
written consent was given.

The usual time-out protocol was performed immediately prior to the
procedure.

Site 1:

Using ultrasound guidance, sterile technique, 1% lidocaine and an
[GT] radioactive seed, right axillary lymph node was localized
using a lateral approach. The follow-up mammogram images confirm the
seed in the expected location and were marked for Dr. EMMUS.

Follow-up survey of the patient confirms presence of the radioactive
seed.

Order number of [GT] seed:  [PHONE_NUMBER].

Total activity:  0.248 millicuries reference Date: [DATE]

Site 2:

Using ultrasound guidance, sterile technique, 1% lidocaine and an
[GT] radioactive seed, right intramammary lymph node was localized
using a lateral approach. The follow-up mammogram images confirm the
seed in the expected location and were marked for Dr. EMMUS.

Follow-up survey of the patient confirms presence of the radioactive
seed.

Order number of [GT] seed:  [PHONE_NUMBER].

Total activity:  0.248 millicuries reference Date: [DATE]

The patient tolerated the procedure well and was released from the
[REDACTED]. She was given instructions regarding seed removal.
IMPRESSION: Radioactive seed localization right breast. No apparent
complications.

Note two radioactive seeds were placed. One within a right axillary
lymph node and one within a right intramammary lymph node.

## 2020-02-28 ENCOUNTER — Ambulatory Visit
Admit: 2020-02-28 | Discharge: 2020-02-28 | Disposition: A | Discharge status: Home / Self Care | Payer: Commercial Managed Care - PPO | Attending: General Surgery | Admitting: General Surgery

## 2020-02-28 ENCOUNTER — Encounter (HOSPITAL_COMMUNITY)
Admission: RE | Disposition: A | Discharge status: Home / Self Care | Payer: Self-pay | Source: Home / Self Care | Attending: General Surgery

## 2020-02-28 ENCOUNTER — Encounter (HOSPITAL_COMMUNITY)
Admission: RE | Admit: 2020-02-28 | Discharge: 2020-02-28 | Disposition: A | Discharge status: Home / Self Care | Payer: Commercial Managed Care - PPO | Source: Ambulatory Visit | Attending: General Surgery | Admitting: General Surgery

## 2020-02-28 ENCOUNTER — Encounter (HOSPITAL_COMMUNITY): Payer: Self-pay | Admitting: General Surgery

## 2020-02-28 ENCOUNTER — Ambulatory Visit
Admission: RE | Admit: 2020-02-28 | Discharge: 2020-02-28 | Disposition: A | Discharge status: Home / Self Care | Payer: Commercial Managed Care - PPO | Source: Ambulatory Visit | Attending: General Surgery | Admitting: General Surgery

## 2020-02-28 ENCOUNTER — Ambulatory Visit (HOSPITAL_COMMUNITY): Payer: Commercial Managed Care - PPO | Admitting: Certified Registered Nurse Anesthetist

## 2020-02-28 ENCOUNTER — Ambulatory Visit (HOSPITAL_COMMUNITY)
Admission: RE | Admit: 2020-02-28 | Discharge: 2020-02-29 | Disposition: A | Discharge status: Home / Self Care | Payer: Commercial Managed Care - PPO | Attending: General Surgery | Admitting: General Surgery

## 2020-02-28 DIAGNOSIS — Z170 Estrogen receptor positive status [ER+]: Secondary | ICD-10-CM

## 2020-02-28 DIAGNOSIS — C50911 Malignant neoplasm of unspecified site of right female breast: Secondary | ICD-10-CM | POA: Diagnosis present

## 2020-02-28 DIAGNOSIS — C50511 Malignant neoplasm of lower-outer quadrant of right female breast: Secondary | ICD-10-CM

## 2020-02-28 DIAGNOSIS — Z79899 Other long term (current) drug therapy: Secondary | ICD-10-CM | POA: Insufficient documentation

## 2020-02-28 DIAGNOSIS — C773 Secondary and unspecified malignant neoplasm of axilla and upper limb lymph nodes: Secondary | ICD-10-CM | POA: Insufficient documentation

## 2020-02-28 HISTORY — PX: MASTECTOMY WITH AXILLARY LYMPH NODE DISSECTION: SHX5661

## 2020-02-28 IMAGING — MG MM BREAST SURGICAL SPECIMEN
1 series · 1 of 1 positions shown · non-contrast
Comparison: Previous exam(s).

CLINICAL DATA: Post right axillary lymph node excision.

EXAM:
SPECIMEN RADIOGRAPH OF THE RIGHT BREAST

[R]
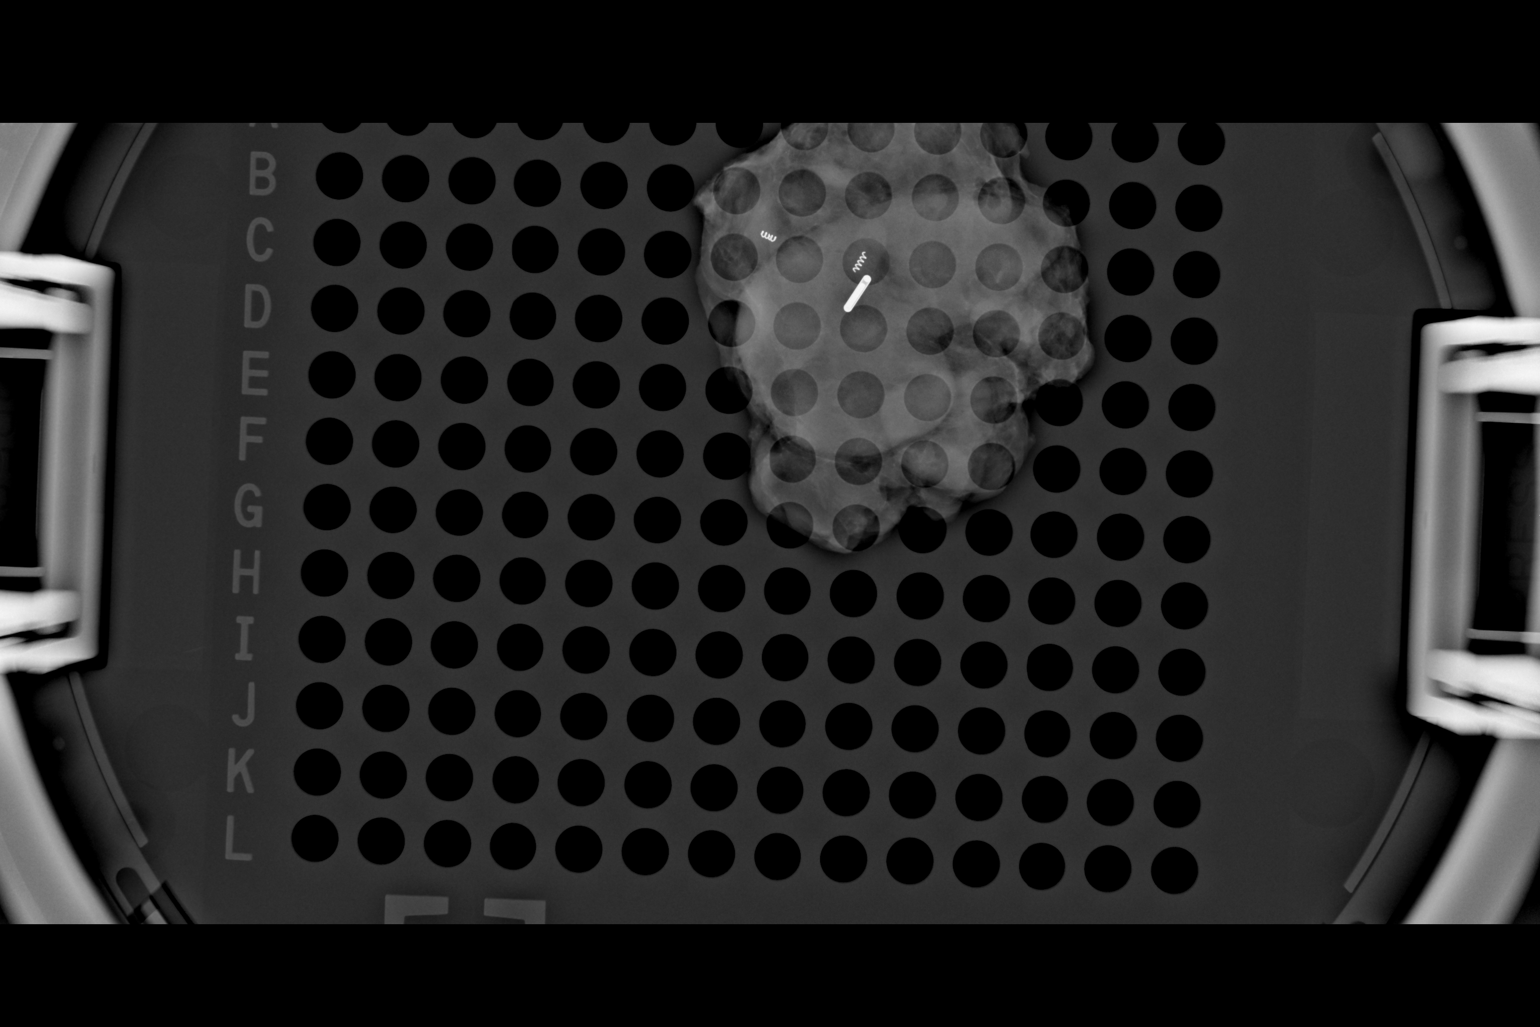

[1 of 1 positions shown; findings below may reference images not displayed]

FINDINGS: Status post excision of the right axilla. The radioactive seed and 2
HydroMARK biopsy marking clips are present, completely intact, and
were marked for pathology.
IMPRESSION: Specimen radiograph of the excised right axillary lymph node.

## 2020-02-28 IMAGING — MG MM BREAST SURGICAL SPECIMEN
1 series · 1 of 1 positions shown · non-contrast
Comparison: Previous exam(s).

CLINICAL DATA: Post excision of right axillary lymph node.

EXAM:
SPECIMEN RADIOGRAPH OF THE RIGHT BREAST

[R]
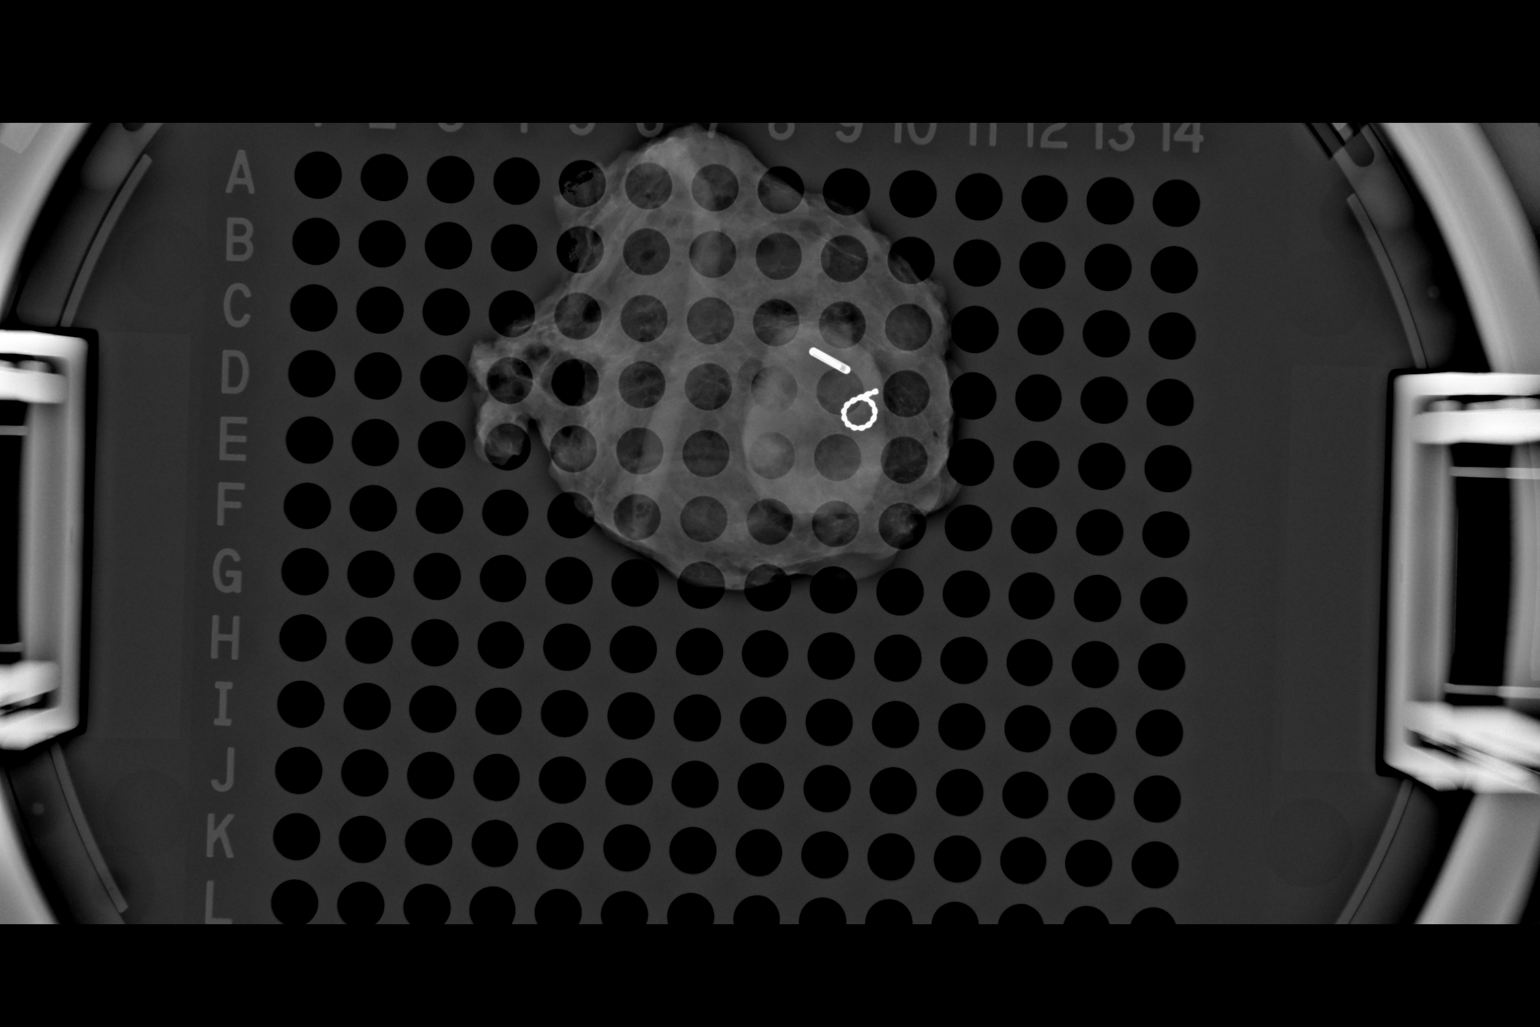

[1 of 1 positions shown; findings below may reference images not displayed]

FINDINGS: Status post excision of the right axillary lymph node. The
radioactive seed and Q shaped biopsy marker clip are present,
completely intact, and were marked for pathology.
IMPRESSION: Specimen radiograph of the right axillary lymph node.

## 2020-02-28 SURGERY — MASTECTOMY WITH AXILLARY LYMPH NODE DISSECTION
Anesthesia: General | Site: Breast | Laterality: Bilateral

## 2020-02-28 MED ORDER — CHLORHEXIDINE GLUCONATE CLOTH 2 % EX PADS: 6.0000 | MEDICATED_PAD | Freq: Once | CUTANEOUS | Status: DC

## 2020-02-28 MED ORDER — TECHNETIUM TC 99M TILMANOCEPT KIT
1.0000 | PACK | Freq: Once | INTRAVENOUS | Status: AC | PRN
  Administered 2020-02-28: 1 via INTRADERMAL

## 2020-02-28 MED ORDER — ANASTROZOLE 1 MG PO TABS
1.0000 mg | ORAL_TABLET | Freq: Every day | ORAL | Status: DC
  Filled 2020-02-28: qty 1

## 2020-02-28 MED ORDER — PROPOFOL 10 MG/ML IV BOLUS
INTRAVENOUS | Status: AC
  Filled 2020-02-28: qty 20

## 2020-02-28 MED ORDER — ONDANSETRON HCL 4 MG/2ML IJ SOLN
INTRAMUSCULAR | Status: DC | PRN
  Administered 2020-02-28: 4 mg via INTRAVENOUS

## 2020-02-28 MED ORDER — HYDROMORPHONE HCL 1 MG/ML IJ SOLN
0.2500 mg | INTRAMUSCULAR | Status: DC | PRN
  Administered 2020-02-28: 0.5 mg via INTRAVENOUS

## 2020-02-28 MED ORDER — CELECOXIB 200 MG PO CAPS
200.0000 mg | ORAL_CAPSULE | ORAL | Status: AC
  Administered 2020-02-28: 200 mg via ORAL
  Filled 2020-02-28: qty 1

## 2020-02-28 MED ORDER — ORAL CARE MOUTH RINSE: 15.0000 mL | Freq: Once | OROMUCOSAL | Status: DC

## 2020-02-28 MED ORDER — PANTOPRAZOLE SODIUM 40 MG IV SOLR
40.0000 mg | Freq: Every day | INTRAVENOUS | Status: DC
  Administered 2020-02-28: 40 mg via INTRAVENOUS
  Filled 2020-02-28: qty 40

## 2020-02-28 MED ORDER — LIDOCAINE 2% (20 MG/ML) 5 ML SYRINGE
INTRAMUSCULAR | Status: AC
  Filled 2020-02-28: qty 5

## 2020-02-28 MED ORDER — HEPARIN SODIUM (PORCINE) 5000 UNIT/ML IJ SOLN
5000.0000 [IU] | Freq: Three times a day (TID) | INTRAMUSCULAR | Status: DC
  Administered 2020-02-29: 5000 [IU] via SUBCUTANEOUS
  Filled 2020-02-28: qty 1

## 2020-02-28 MED ORDER — SODIUM CHLORIDE 0.9 % IV SOLN: INTRAVENOUS | Status: DC

## 2020-02-28 MED ORDER — PHENYLEPHRINE 40 MCG/ML (10ML) SYRINGE FOR IV PUSH (FOR BLOOD PRESSURE SUPPORT)
PREFILLED_SYRINGE | INTRAVENOUS | Status: AC
  Filled 2020-02-28: qty 10

## 2020-02-28 MED ORDER — HYDROCODONE-ACETAMINOPHEN 5-325 MG PO TABS
1.0000 | ORAL_TABLET | ORAL | Status: DC | PRN
  Administered 2020-02-28 – 2020-02-29 (×2): 1 via ORAL
  Filled 2020-02-28: qty 1
  Filled 2020-02-28: qty 2

## 2020-02-28 MED ORDER — ARTIFICIAL TEARS OPHTHALMIC OINT
TOPICAL_OINTMENT | OPHTHALMIC | Status: AC
  Filled 2020-02-28: qty 3.5

## 2020-02-28 MED ORDER — MIDAZOLAM HCL 2 MG/2ML IJ SOLN: 2.0000 mg | Freq: Once | INTRAMUSCULAR | Status: AC

## 2020-02-28 MED ORDER — CEFAZOLIN SODIUM-DEXTROSE 2-4 GM/100ML-% IV SOLN
2.0000 g | INTRAVENOUS | Status: AC
  Administered 2020-02-28: 2 g via INTRAVENOUS
  Filled 2020-02-28: qty 100

## 2020-02-28 MED ORDER — ONDANSETRON 4 MG PO TBDP: 4.0000 mg | ORAL_TABLET | Freq: Four times a day (QID) | ORAL | Status: DC | PRN

## 2020-02-28 MED ORDER — ROSUVASTATIN CALCIUM 5 MG PO TABS: 10.0000 mg | ORAL_TABLET | Freq: Every day | ORAL | Status: DC

## 2020-02-28 MED ORDER — METHYLENE BLUE 0.5 % INJ SOLN
INTRAVENOUS | Status: AC
  Filled 2020-02-28: qty 10

## 2020-02-28 MED ORDER — MIDAZOLAM HCL 2 MG/2ML IJ SOLN
INTRAMUSCULAR | Status: AC
  Administered 2020-02-28: 2 mg via INTRAVENOUS
  Filled 2020-02-28: qty 2

## 2020-02-28 MED ORDER — SUGAMMADEX SODIUM 200 MG/2ML IV SOLN
INTRAVENOUS | Status: DC | PRN
  Administered 2020-02-28: 140 mg via INTRAVENOUS

## 2020-02-28 MED ORDER — PHENYLEPHRINE HCL (PRESSORS) 10 MG/ML IV SOLN
INTRAVENOUS | Status: AC
  Filled 2020-02-28: qty 1

## 2020-02-28 MED ORDER — FENTANYL CITRATE (PF) 100 MCG/2ML IJ SOLN: 100.0000 ug | Freq: Once | INTRAMUSCULAR | Status: AC

## 2020-02-28 MED ORDER — FENTANYL CITRATE (PF) 100 MCG/2ML IJ SOLN
INTRAMUSCULAR | Status: AC
  Administered 2020-02-28: 100 ug via INTRAVENOUS
  Filled 2020-02-28: qty 2

## 2020-02-28 MED ORDER — SCOPOLAMINE 1 MG/3DAYS TD PT72: 1.0000 | MEDICATED_PATCH | TRANSDERMAL | Status: DC

## 2020-02-28 MED ORDER — SCOPOLAMINE 1 MG/3DAYS TD PT72
MEDICATED_PATCH | TRANSDERMAL | Status: AC
  Administered 2020-02-28: 1.5 mg via TRANSDERMAL
  Filled 2020-02-28: qty 1

## 2020-02-28 MED ORDER — FENTANYL CITRATE (PF) 250 MCG/5ML IJ SOLN
INTRAMUSCULAR | Status: DC | PRN
  Administered 2020-02-28: 25 ug via INTRAVENOUS
  Administered 2020-02-28 (×2): 50 ug via INTRAVENOUS
  Administered 2020-02-28: 25 ug via INTRAVENOUS
  Administered 2020-02-28: 100 ug via INTRAVENOUS

## 2020-02-28 MED ORDER — CHLORHEXIDINE GLUCONATE 0.12 % MT SOLN
15.0000 mL | Freq: Once | OROMUCOSAL | Status: DC
  Filled 2020-02-28: qty 15

## 2020-02-28 MED ORDER — ACETAMINOPHEN 500 MG PO TABS
1000.0000 mg | ORAL_TABLET | ORAL | Status: AC
  Administered 2020-02-28: 1000 mg via ORAL
  Filled 2020-02-28: qty 2

## 2020-02-28 MED ORDER — ROCURONIUM BROMIDE 10 MG/ML (PF) SYRINGE
PREFILLED_SYRINGE | INTRAVENOUS | Status: AC
  Filled 2020-02-28: qty 10

## 2020-02-28 MED ORDER — ALBUMIN HUMAN 5 % IV SOLN: INTRAVENOUS | Status: DC | PRN

## 2020-02-28 MED ORDER — GABAPENTIN 300 MG PO CAPS
300.0000 mg | ORAL_CAPSULE | ORAL | Status: AC
  Administered 2020-02-28: 300 mg via ORAL
  Filled 2020-02-28: qty 1

## 2020-02-28 MED ORDER — PHENYLEPHRINE HCL-NACL 10-0.9 MG/250ML-% IV SOLN
INTRAVENOUS | Status: DC | PRN
  Administered 2020-02-28: 10 ug/min via INTRAVENOUS

## 2020-02-28 MED ORDER — BUPIVACAINE HCL (PF) 0.25 % IJ SOLN
INTRAMUSCULAR | Status: DC | PRN
  Administered 2020-02-28 (×2): 25 mL

## 2020-02-28 MED ORDER — 0.9 % SODIUM CHLORIDE (POUR BTL) OPTIME
TOPICAL | Status: DC | PRN
  Administered 2020-02-28: 1000 mL

## 2020-02-28 MED ORDER — LEVOTHYROXINE SODIUM 125 MCG PO TABS
125.0000 ug | ORAL_TABLET | Freq: Every day | ORAL | Status: DC
  Administered 2020-02-29: 125 ug via ORAL
  Filled 2020-02-28: qty 1

## 2020-02-28 MED ORDER — EPHEDRINE SULFATE-NACL 50-0.9 MG/10ML-% IV SOSY
PREFILLED_SYRINGE | INTRAVENOUS | Status: DC | PRN
  Administered 2020-02-28: 5 mg via INTRAVENOUS

## 2020-02-28 MED ORDER — PROPOFOL 10 MG/ML IV BOLUS
INTRAVENOUS | Status: DC | PRN
  Administered 2020-02-28: 40 mg via INTRAVENOUS
  Administered 2020-02-28: 120 mg via INTRAVENOUS

## 2020-02-28 MED ORDER — ONDANSETRON HCL 4 MG/2ML IJ SOLN: 4.0000 mg | Freq: Four times a day (QID) | INTRAMUSCULAR | Status: DC | PRN

## 2020-02-28 MED ORDER — LIDOCAINE 2% (20 MG/ML) 5 ML SYRINGE
INTRAMUSCULAR | Status: DC | PRN
  Administered 2020-02-28: 40 mg via INTRAVENOUS
  Administered 2020-02-28: 60 mg via INTRAVENOUS

## 2020-02-28 MED ORDER — FENTANYL CITRATE (PF) 250 MCG/5ML IJ SOLN
INTRAMUSCULAR | Status: AC
  Filled 2020-02-28: qty 5

## 2020-02-28 MED ORDER — DEXAMETHASONE SODIUM PHOSPHATE 10 MG/ML IJ SOLN
INTRAMUSCULAR | Status: DC | PRN
  Administered 2020-02-28: 8 mg via INTRAVENOUS

## 2020-02-28 MED ORDER — MIDAZOLAM HCL 2 MG/2ML IJ SOLN
INTRAMUSCULAR | Status: AC
  Filled 2020-02-28: qty 2

## 2020-02-28 MED ORDER — ONDANSETRON HCL 4 MG/2ML IJ SOLN
INTRAMUSCULAR | Status: AC
  Filled 2020-02-28: qty 2

## 2020-02-28 MED ORDER — METHOCARBAMOL 500 MG PO TABS: 500.0000 mg | ORAL_TABLET | Freq: Four times a day (QID) | ORAL | Status: DC | PRN

## 2020-02-28 MED ORDER — HYDROCODONE-ACETAMINOPHEN 5-325 MG PO TABS: 1.0000 | ORAL_TABLET | Freq: Four times a day (QID) | ORAL | 0 refills | Status: DC | PRN

## 2020-02-28 MED ORDER — PHENYLEPHRINE 40 MCG/ML (10ML) SYRINGE FOR IV PUSH (FOR BLOOD PRESSURE SUPPORT)
PREFILLED_SYRINGE | INTRAVENOUS | Status: DC | PRN
  Administered 2020-02-28 (×3): 40 ug via INTRAVENOUS

## 2020-02-28 MED ORDER — HYDROMORPHONE HCL 1 MG/ML IJ SOLN
INTRAMUSCULAR | Status: AC
  Filled 2020-02-28: qty 1

## 2020-02-28 MED ORDER — MORPHINE SULFATE (PF) 2 MG/ML IV SOLN
1.0000 mg | INTRAVENOUS | Status: DC | PRN
  Administered 2020-02-28: 1 mg via INTRAVENOUS
  Filled 2020-02-28: qty 1

## 2020-02-28 MED ORDER — ROCURONIUM BROMIDE 10 MG/ML (PF) SYRINGE
PREFILLED_SYRINGE | INTRAVENOUS | Status: DC | PRN
  Administered 2020-02-28: 60 mg via INTRAVENOUS

## 2020-02-28 MED ORDER — SODIUM CHLORIDE (PF) 0.9 % IJ SOLN
INTRAMUSCULAR | Status: AC
  Filled 2020-02-28: qty 10

## 2020-02-28 MED ORDER — DEXAMETHASONE SODIUM PHOSPHATE 10 MG/ML IJ SOLN
INTRAMUSCULAR | Status: AC
  Filled 2020-02-28: qty 1

## 2020-02-28 MED ORDER — LACTATED RINGERS IV SOLN: INTRAVENOUS | Status: DC

## 2020-02-28 MED ORDER — ONDANSETRON HCL 4 MG/2ML IJ SOLN: 4.0000 mg | Freq: Once | INTRAMUSCULAR | Status: DC | PRN

## 2020-02-28 MED ORDER — BUPIVACAINE LIPOSOME 1.3 % IJ SUSP
INTRAMUSCULAR | Status: DC | PRN
  Administered 2020-02-28 (×2): 5 mL via PERINEURAL

## 2020-02-28 SURGICAL SUPPLY — 43 items
ADH SKN CLS APL DERMABOND .7 (GAUZE/BANDAGES/DRESSINGS) ×1
APL PRP STRL LF DISP 70% ISPRP (MISCELLANEOUS) ×1
APPLIER CLIP 9.375 MED OPEN (MISCELLANEOUS) ×2
APR CLP MED 9.3 20 MLT OPN (MISCELLANEOUS) ×1
BINDER BREAST LRG (GAUZE/BANDAGES/DRESSINGS) ×1 IMPLANT
BINDER BREAST XLRG (GAUZE/BANDAGES/DRESSINGS) IMPLANT
CANISTER SUCT 3000ML PPV (MISCELLANEOUS) ×2 IMPLANT
CHLORAPREP W/TINT 26 (MISCELLANEOUS) ×2 IMPLANT
CLIP APPLIE 9.375 MED OPEN (MISCELLANEOUS) ×1 IMPLANT
COVER PROBE W GEL 5X96 (DRAPES) ×1 IMPLANT
COVER SURGICAL LIGHT HANDLE (MISCELLANEOUS) ×2 IMPLANT
COVER WAND RF STERILE (DRAPES) ×1 IMPLANT
DERMABOND ADVANCED (GAUZE/BANDAGES/DRESSINGS) ×1
DERMABOND ADVANCED .7 DNX12 (GAUZE/BANDAGES/DRESSINGS) ×1 IMPLANT
DEVICE DSSCT PLSMBLD 3.0S LGHT (MISCELLANEOUS) IMPLANT
DRAIN CHANNEL 19F RND (DRAIN) ×4 IMPLANT
DRAPE CHEST BREAST 15X10 FENES (DRAPES) ×2 IMPLANT
DRSG PAD ABDOMINAL 8X10 ST (GAUZE/BANDAGES/DRESSINGS) ×2 IMPLANT
ELECT CAUTERY BLADE 6.4 (BLADE) ×2 IMPLANT
ELECT REM PT RETURN 9FT ADLT (ELECTROSURGICAL) ×2
ELECTRODE REM PT RTRN 9FT ADLT (ELECTROSURGICAL) ×1 IMPLANT
EVACUATOR SILICONE 100CC (DRAIN) ×4 IMPLANT
FILTER IN LINE W/DETACHED HOSE (FILTER) ×2 IMPLANT
GAUZE SPONGE 4X4 12PLY STRL (GAUZE/BANDAGES/DRESSINGS) ×1 IMPLANT
GLOVE BIO SURGEON STRL SZ7.5 (GLOVE) ×2 IMPLANT
GOWN STRL REUS W/ TWL LRG LVL3 (GOWN DISPOSABLE) ×3 IMPLANT
GOWN STRL REUS W/TWL LRG LVL3 (GOWN DISPOSABLE) ×6
KIT BASIN OR (CUSTOM PROCEDURE TRAY) ×2 IMPLANT
KIT MARKER MARGIN INK (KITS) ×1 IMPLANT
KIT TURNOVER KIT B (KITS) ×2 IMPLANT
NS IRRIG 1000ML POUR BTL (IV SOLUTION) ×2 IMPLANT
PACK GENERAL/GYN (CUSTOM PROCEDURE TRAY) ×2 IMPLANT
PAD ARMBOARD 7.5X6 YLW CONV (MISCELLANEOUS) ×2 IMPLANT
PENCIL SMOKE EVACUATOR (MISCELLANEOUS) ×1 IMPLANT
PLASMABLADE 3.0S W/LIGHT (MISCELLANEOUS) ×2
SPECIMEN JAR X LARGE (MISCELLANEOUS) ×2 IMPLANT
SUT ETHILON 3 0 FSL (SUTURE) ×5 IMPLANT
SUT MNCRL AB 4-0 PS2 18 (SUTURE) ×2 IMPLANT
SUT VIC AB 3-0 SH 18 (SUTURE) ×3 IMPLANT
SYR CONTROL 10ML LL (SYRINGE) ×2 IMPLANT
TOWEL GREEN STERILE (TOWEL DISPOSABLE) ×2 IMPLANT
TOWEL GREEN STERILE FF (TOWEL DISPOSABLE) ×2 IMPLANT
TUBE CONNECTING 12X1/4 (SUCTIONS) IMPLANT

## 2020-02-28 NOTE — H&P (Signed)
[name]  Location: [facility] Surgery Patient #: [id number] DOB: 04-27-1955 Married / Language: English / Race: White Female   History of Present Illness The patient is a 65 year old female who presents for a follow-up for Breast cancer. The patient is a 65 year old white female who was seen originally in June with a locally advanced right breast cancer that measured 8.5 cm with 2 abnormal-looking lymph nodes. The cancer was ER and PR positive and HER-2 negative. She was treated with neoadjuvant hormonal therapy and has had some response. She denies any pain in the breast. There is now only one abnormal looking lymph node that is half the size. The breast still has about 8-1/2 cm of enhancement in the lower quadrants. She is ready to schedule her definitive surgery.   Allergies  No Known Drug Allergies   Medication History Rosuvastatin Calcium (Oral) Specific strength unknown - Active. Synthroid (Oral) Specific strength unknown - Active. Anastrozole (1MG Tablet, Oral) Active. Calcium (500MG Tablet, Oral) Active. Vitamin D (1000UNIT Tablet, Oral) Active. Medications Reconciled    Review of Systems  General Not Present- Appetite Loss, Chills, Fatigue, Fever, Night Sweats, Weight Gain and Weight Loss. Skin Not Present- Change in Wart/Mole, Dryness, Hives, Jaundice, New Lesions, Non-Healing Wounds, Rash and Ulcer. HEENT Present- Wears glasses/contact lenses. Not Present- Earache, Hearing Loss, Hoarseness, Nose Bleed, Oral Ulcers, Ringing in the Ears, Seasonal Allergies, Sinus Pain, Sore Throat, Visual Disturbances and Yellow Eyes. Respiratory Not Present- Bloody sputum, Chronic Cough, Difficulty Breathing, Snoring and Wheezing. Breast Present- Breast Mass. Not Present- Breast Pain, Nipple Discharge and Skin Changes. Cardiovascular Not Present- Chest Pain, Difficulty Breathing Lying Down, Leg Cramps, Palpitations, Rapid Heart Rate, Shortness of Breath and Swelling of  Extremities. Gastrointestinal Not Present- Abdominal Pain, Bloating, Bloody Stool, Change in Bowel Habits, Chronic diarrhea, Constipation, Difficulty Swallowing, Excessive gas, Gets full quickly at meals, Hemorrhoids, Indigestion, Nausea, Rectal Pain and Vomiting. Female Genitourinary Not Present- Frequency, Nocturia, Painful Urination, Pelvic Pain and Urgency. Musculoskeletal Not Present- Back Pain, Joint Pain, Joint Stiffness, Muscle Pain, Muscle Weakness and Swelling of Extremities. Neurological Not Present- Decreased Memory, Fainting, Headaches, Numbness, Seizures, Tingling, Tremor, Trouble walking and Weakness. Psychiatric Not Present- Anxiety, Bipolar, Change in Sleep Pattern, Depression, Fearful and Frequent crying. Endocrine Not Present- Cold Intolerance, Excessive Hunger, Hair Changes, Heat Intolerance, Hot flashes and New Diabetes. Hematology Not Present- Blood Thinners, Easy Bruising, Excessive bleeding, Gland problems, HIV and Persistent Infections.  Vitals  Weight: 144.25 lb Height: 67in Body Surface Area: 1.76 m Body Mass Index: 22.59 kg/m  Temp.: 97.25F  Pulse: 98 (Regular)  BP: 124/72(Sitting, Left Arm, Standard)       Physical Exam General Mental Status-Alert. General Appearance-Consistent with stated age. Hydration-Well hydrated. Voice-Normal.  Head and Neck Head-normocephalic, atraumatic with no lesions or palpable masses. Trachea-midline. Thyroid Gland Characteristics - normal size and consistency.  Eye Eyeball - Bilateral-Extraocular movements intact. Sclera/Conjunctiva - Bilateral-No scleral icterus.  Chest and Lung Exam Chest and lung exam reveals -quiet, even and easy respiratory effort with no use of accessory muscles and on auscultation, normal breath sounds, no adventitious sounds and normal vocal resonance. Inspection Chest Wall - Normal. Back - normal.  Breast Note: It is difficult to palpate any mass now in the  right breast.. There is no palpable mass in the left breast. There is no palpable axillary, supraclavicular, or cervical lymphadenopathy. She does have fairly dense and nodular breast tissue bilaterally.   Cardiovascular Cardiovascular examination reveals -normal heart sounds, regular rate and rhythm with  no murmurs and normal pedal pulses bilaterally.  Abdomen Inspection Inspection of the abdomen reveals - No Hernias. Skin - Scar - no surgical scars. Palpation/Percussion Palpation and Percussion of the abdomen reveal - Soft, Non Tender, No Rebound tenderness, No Rigidity (guarding) and No hepatosplenomegaly. Auscultation Auscultation of the abdomen reveals - Bowel sounds normal.  Neurologic Neurologic evaluation reveals -alert and oriented x 3 with no impairment of recent or remote memory. Mental Status-Normal.  Musculoskeletal Normal Exam - Left-Upper Extremity Strength Normal and Lower Extremity Strength Normal. Normal Exam - Right-Upper Extremity Strength Normal and Lower Extremity Strength Normal.  Lymphatic Head & Neck  General Head & Neck Lymphatics: Bilateral - Description - Normal. Axillary  General Axillary Region: Bilateral - Description - Normal. Tenderness - Non Tender. Femoral & Inguinal  Generalized Femoral & Inguinal Lymphatics: Bilateral - Description - Normal. Tenderness - Non Tender.    Assessment & Plan  MALIGNANT NEOPLASM OF LOWER-OUTER QUADRANT OF RIGHT BREAST OF FEMALE, ESTROGEN RECEPTOR POSITIVE (C50.511) Impression: The patient has a known cancer involving both lower quadrants of the right breast with 2 original abnormal lymph nodes. She has been treated with neoadjuvant hormonal therapy and has had some response. She now only has one abnormal looking lymph node that is half the size and it is difficult to palpate the cancer in the right breast. I have discussed with her the options for treatment and at this point she is favoring bilateral  mastectomies which I think is a very reasonable option. She would be a candidate for sentinel node mapping with targeted node dissection. I have discussed with her in detail the risks and benefits of the operation as well as some of the technical aspects and she understands and wishes to proceed. This patient encounter took 30 minutes today to perform the following: take history, perform exam, review outside records, interpret imaging, counsel the patient on their diagnosis and document encounter, findings & plan in the EHR

## 2020-02-28 NOTE — Anesthesia Procedure Notes (Signed)
Anesthesia Regional Block: Pectoralis block   Pre-Anesthetic Checklist: ,, timeout performed, Correct Patient, Correct Site, Correct Laterality, Correct Procedure, Correct Position, site marked, Risks and benefits discussed,  Surgical consent,  Pre-op evaluation,  At surgeon's request and post-op pain management  Laterality: Left  Prep: chloraprep       Needles:  Injection technique: Single-shot  Needle Type: Echogenic Stimulator Needle     Needle Length: 9cm  Needle Gauge: 21   Needle insertion depth: 7 cm   Additional Needles:   Procedures:,,,, ultrasound used (permanent image in chart),,,,  Narrative:  Start time: 02/28/2020 8:10 AM End time: 02/28/2020 8:15 AM Injection made incrementally with aspirations every 5 mL.  Performed by: Personally  Anesthesiologist: Josephine Igo, MD  Additional Notes: Timeout performed. Patient sedated. Relevant anatomy ID'd using [place]. Incremental 2-50ml injection of LA with frequent aspiration. Patient tolerated procedure well.        Left Pectoralis Block

## 2020-02-28 NOTE — Op Note (Signed)
02/28/2020  10:52 AM  PATIENT:  [name]  65 y.o. female  PRE-OPERATIVE DIAGNOSIS:  RIGHT BREAST CANCER  POST-OPERATIVE DIAGNOSIS:  RIGHT BREAST CANCER  PROCEDURE:  Procedure(s) with comments: RIGHT MASTECTOMY WITH DEEP RIGHT AXILLARY SENTINEL LYMPH NODE BIOPSY AND RIGHT TARGETED RADIOACTIVE SEED GUIDED  LYMPH NODE DISSECTION, LEFT PROPHYLACTIC MASTECTOMY  SURGEON:  Surgeon(s) and Role:    * Jovita Kussmaul, MD - Primary  PHYSICIAN ASSISTANT:   ASSISTANTS: Judyann Munson, RNFA   ANESTHESIA:   general  EBL:  40 mL   BLOOD ADMINISTERED:none  DRAINS: (2) Jackson-Pratt drain(s) with closed bulb suction in the prepectoral space   LOCAL MEDICATIONS USED:  NONE  SPECIMEN:  Source of Specimen:  left mastectomy, right mastectomy with sentinel nodes x 3 and 2 targeted nodes  DISPOSITION OF SPECIMEN:  PATHOLOGY  COUNTS:  YES  TOURNIQUET:  * No tourniquets in log *  DICTATION: .Dragon Dictation   After informed consent was obtained the patient was brought to the operating room and placed in the supine position on the operating table.  After adequate induction of general anesthesia the patient's bilateral chest, breast, and axillary areas were prepped with ChloraPrep, allowed to dry, draped in usual sterile manner.  An appropriate timeout was performed.  Previously 2 I-125 seeds were placed in lymph nodes in the right axilla and right breast to mark a previously positive axillary lymph node and a previously positive intramammary lymph node.  Earlier in the day the patient also underwent injection of 1 mCi of technetium sulfur colloid in the subareolar position on the right.  Attention was first turned to the left breast.  An elliptical incision was made around the nipple and areolar complex in order to minimize the excess skin.  The incision was carried through the skin and subcutaneous tissue sharply with the PlasmaBlade.  Breast hooks were used to elevate the skin flaps  anteriorly towards the ceiling.  Then skin flaps were then created circumferentially by dissecting between the breast tissue and the subcutaneous fat.  This dissection was carried all the way to the chest wall circumferentially.  Next the breast was removed from the pectoralis muscle with the pectoralis fascia sharply with the PlasmaBlade.  Once this was accomplished the entire breast was removed.  It was oriented with a stitch on the lateral skin and sent to pathology for further evaluation.  There was 1 lymph node at the edge of the breast tissue that was removed with the mastectomy and sent separately to pathology.  Hemostasis was then achieved using the PlasmaBlade.  The wound was irrigated with copious amounts of saline.  A small stab incision was made near the anterior axillary line inferior to the operative bed with a 15 blade knife.  A tonsil clamp was placed through this opening and used to bring a 19 [place] round Blake drain into the operative bed.  The drain was cut along the chest wall.  The drain was anchored to the skin with a 3-0 nylon stitch.  The superior and inferior skin flaps were then grossly reapproximated with interrupted 3-0 Vicryl stitches.  The skin was then closed with a running 4-0 Monocryl subcuticular stitch.  The drain was placed to bulb suction and there was a good seal.  Attention was then turned to the right breast.  A similar elliptical incision was made with a 10 blade knife around the nipple and areolar complex in order to minimize the excess skin.  The incision was carried  through the skin and subcutaneous tissue sharply with the PlasmaBlade.  Breast hooks were then used to elevate the skin flaps anteriorly towards the ceiling.  Thin skin flaps were then created by dissecting between the breast tissue and the subcutaneous fat.  This dissection was carried circumferentially all the way to the chest wall.  Next the breast was removed from the pectoralis muscle with the pectoralis  fascia sharply with the PlasmaBlade.  Once this was accomplished the breast was oriented with a stitch on the lateral skin.  Before sending it to pathology the intramammary lymph node was identified using the neoprobe set to I-125 and this lymph node was removed and sent as the targeted intramammary lymph node.  With the neoprobe set to I-125 I was also able to identify the axillary node that was marked with a seed.  This was excised sharply with the PlasmaBlade and sent as targeted node from the right axilla.  The neoprobe was then switched to technetium.  There was some faint signal in the right axilla.  I was able to identify 2 lymph nodes with some radioactivity and 1 palpable lymph node.  Each of these was excised sharply with the PlasmaBlade and the small surrounding vessels and lymphatics were controlled with clips.  Ex vivo counts on these 2 nodes were approximately 30.  No other hot or palpable nodes were identified in the right axilla.  Hemostasis was achieved using the PlasmaBlade.  The wound was irrigated with copious amounts of saline.  Next a small stab incision was made near the anterior axillary line inferior to the operative bed with a 15 blade knife.  A tonsil clamp was placed through this opening and used to bring a 19 [place] round Blake drain into the operative bed.  The drain was anchored to the skin with a 3-0 nylon stitch.  The drain was tunneled along the chest wall.  The superior and inferior skin flaps were then grossly reapproximated with interrupted 3-0 Vicryl stitches.  The skin was then closed with a running 4-0 Monocryl subcuticular stitch.  Dermabond dressings were applied.  The patient tolerated the procedure well.  At the end of the case all needle sponge and instrument counts were correct.  The patient was then awakened and taken to recovery in stable condition.  PLAN OF CARE: Admit for overnight observation  PATIENT DISPOSITION:  PACU - hemodynamically stable.   Delay start  of Pharmacological VTE agent (>24hrs) due to surgical blood loss or risk of bleeding: no

## 2020-02-28 NOTE — Interval H&P Note (Signed)
History and Physical Interval Note:  02/28/2020 8:09 AM  [name]  has presented today for surgery, with the diagnosis of RIGHT BREAST CANCER.  The various methods of treatment have been discussed with the patient and family. After consideration of risks, benefits and other options for treatment, the patient has consented to  Procedure(s) with comments: [place] (Bilateral) - PEC BLOCK, RNFA (SHARON HITCHCOCK) as a surgical intervention.  The patient's history has been reviewed, patient examined, no change in status, stable for surgery.  I have reviewed the patient's chart and labs.  Questions were answered to the patient's satisfaction.     [name]

## 2020-02-28 NOTE — Anesthesia Procedure Notes (Signed)
Procedure Name: Intubation Date/Time: 02/28/2020 8:43 AM Performed by: Verdie Drown, CRNA Pre-anesthesia Checklist: Patient identified, Emergency Drugs available, Suction available and Patient being monitored Patient Re-evaluated:Patient Re-evaluated prior to induction Oxygen Delivery Method: Circle System Utilized Preoxygenation: Pre-oxygenation with 100% oxygen Induction Type: IV induction Ventilation: Mask ventilation without difficulty Laryngoscope Size: Mac and 3 Grade View: Grade I Tube type: Oral Tube size: 7.0 mm Number of attempts: 1 Airway Equipment and Method: Stylet and Oral airway Placement Confirmation: ETT inserted through vocal cords under direct vision,  positive ETCO2 and breath sounds checked- equal and bilateral Secured at: 21 cm Tube secured with: Tape Dental Injury: Teeth and Oropharynx as per pre-operative assessment

## 2020-02-28 NOTE — Anesthesia Postprocedure Evaluation (Signed)
Anesthesia Post Note  Patient: [name]  Procedure(s) Performed: BILATERAL MASTECTOMY WITH RIGHT SENTINEL LYMPH NODE MAPPING AND RIGHT TARGETED RADIOACTIVE SEED GUIDED  LYMPH NODE DISSECTION (Bilateral Breast)     Patient location during evaluation: PACU Anesthesia Type: General Level of consciousness: awake and alert and oriented Pain management: pain level controlled Vital Signs Assessment: post-procedure vital signs reviewed and stable Respiratory status: spontaneous breathing, nonlabored ventilation and respiratory function stable Cardiovascular status: blood pressure returned to baseline and stable Postop Assessment: no apparent nausea or vomiting Anesthetic complications: no   No complications documented.  Last Vitals:  Vitals:   02/28/20 1140 02/28/20 1155  BP: (!) 147/79 (!) 143/79  Pulse: 66 61  Resp: 14 17  Temp:    SpO2: 100% 100%    Last Pain:  Vitals:   02/28/20 1140  TempSrc:   PainSc: 4                  Breahna Boylen A.

## 2020-02-28 NOTE — Transfer of Care (Signed)
Immediate Anesthesia Transfer of Care Note  Patient: [name]  Procedure(s) Performed: BILATERAL MASTECTOMY WITH RIGHT SENTINEL LYMPH NODE MAPPING AND RIGHT TARGETED RADIOACTIVE SEED GUIDED  LYMPH NODE DISSECTION (Bilateral Breast)  Patient Location: PACU  Anesthesia Type:General  Level of Consciousness: awake, patient cooperative and responds to stimulation  Airway & Oxygen Therapy: Patient Spontanous Breathing and Patient connected to nasal cannula oxygen  Post-op Assessment: Report given to RN and Post -op Vital signs reviewed and stable  Post vital signs: Reviewed and stable  Last Vitals:  Vitals Value Taken Time  BP 131/76 02/28/20 1110  Temp 36.6 C 02/28/20 1110  Pulse 75 02/28/20 1111  Resp 19 02/28/20 1111  SpO2 99 % 02/28/20 1111  Vitals shown include unvalidated device data.  Last Pain:  Vitals:   02/28/20 0656  TempSrc: Oral         Complications: No complications documented.

## 2020-02-28 NOTE — Anesthesia Procedure Notes (Signed)
Anesthesia Regional Block: Pectoralis block   Pre-Anesthetic Checklist: ,, timeout performed, Correct Patient, Correct Site, Correct Laterality, Correct Procedure, Correct Position, site marked, Risks and benefits discussed,  Surgical consent,  Pre-op evaluation,  At surgeon's request and post-op pain management  Laterality: Right  Prep: chloraprep       Needles:  Injection technique: Single-shot  Needle Type: Echogenic Stimulator Needle     Needle Length: 9cm  Needle Gauge: 21   Needle insertion depth: 7 cm   Additional Needles:   Procedures:,,,, ultrasound used (permanent image in chart),,,,  Narrative:  Start time: 02/28/2020 8:05 AM End time: 02/28/2020 8:10 AM Injection made incrementally with aspirations every 5 mL.  Performed by: Personally  Anesthesiologist: Josephine Igo, MD  Additional Notes: Timeout performed. Patient sedated. Relevant anatomy ID'd using [place]. Incremental 2-34ml injection of LA with frequent aspiration. Patient tolerated procedure well.        Right Pectoralis Block

## 2020-02-29 ENCOUNTER — Encounter (HOSPITAL_COMMUNITY): Payer: Self-pay | Admitting: General Surgery

## 2020-02-29 DIAGNOSIS — C50511 Malignant neoplasm of lower-outer quadrant of right female breast: Secondary | ICD-10-CM | POA: Diagnosis not present

## 2020-02-29 NOTE — Progress Notes (Signed)
1 Day Post-Op   Subjective/Chief Complaint: No complaints. Feels great   Objective: Vital signs in last 24 hours: Temp:  [97.8 F (36.6 C)-98.7 F (37.1 C)] 98 F (36.7 C) (01/14 0432) Pulse Rate:  [58-85] 70 (01/14 0432) Resp:  [13-18] 18 (01/14 0432) BP: (131-150)/(72-82) 147/82 (01/14 0432) SpO2:  [95 %-100 %] 97 % (01/14 0432)    Intake/Output from previous day: 01/13 0701 - 01/14 0700 In: 2532.5 [P.O.:915; I.V.:1267.5; IV Piggyback:350] Out: 355 [Drains:315; Blood:40] Intake/Output this shift: No intake/output data recorded.  General appearance: alert and cooperative Resp: clear to auscultation bilaterally Chest wall: skin flaps look good Cardio: regular rate and rhythm GI: soft, non-tender; bowel sounds normal; no masses,  no organomegaly  Lab Results:  No results for input(s): WBC, HGB, HCT, PLT in the last 72 hours. BMET No results for input(s): NA, K, CL, CO2, GLUCOSE, BUN, CREATININE, CALCIUM in the last 72 hours. PT/INR No results for input(s): LABPROT, INR in the last 72 hours. ABG No results for input(s): PHART, HCO3 in the last 72 hours.  Invalid input(s): PCO2, PO2  Studies/Results: NM Sentinel Node Inj-No Rpt (Breast)  Result Date: 02/28/2020 Sulfur colloid was injected by the nuclear medicine technologist for melanoma sentinel node.   MM BREAST SURGICAL SPECIMEN  Result Date: 02/28/2020 CLINICAL DATA:  Post right axillary lymph node excision. EXAM: SPECIMEN RADIOGRAPH OF THE RIGHT BREAST COMPARISON:  Previous exam(s). FINDINGS: Status post excision of the right axilla. The radioactive seed and 2 HydroMARK biopsy marking clips are present, completely intact, and were marked for pathology. IMPRESSION: Specimen radiograph of the excised right axillary lymph node. Electronically Signed   By: Everlean Alstrom M.D.   On: 02/28/2020 10:33   MM Breast Surgical Specimen  Result Date: 02/28/2020 CLINICAL DATA:  Post excision of right axillary lymph node.  EXAM: SPECIMEN RADIOGRAPH OF THE RIGHT BREAST COMPARISON:  Previous exam(s). FINDINGS: Status post excision of the right axillary lymph node. The radioactive seed and Q shaped biopsy marker clip are present, completely intact, and were marked for pathology. IMPRESSION: Specimen radiograph of the right axillary lymph node. Electronically Signed   By: Everlean Alstrom M.D.   On: 02/28/2020 10:25   [place] RT RADIOACTIVE SEED LOC  Result Date: 02/27/2020 CLINICAL DATA:  Patient for preoperative localization prior to bilateral mastectomies and targeted right lymph node resection. EXAM: ULTRASOUND GUIDED RADIOACTIVE SEED LOCALIZATION OF THE RIGHT BREAST COMPARISON:  Previous exam(s). FINDINGS: Patient presents for radioactive seed localization prior to right targeted lymph node resection. I met with the patient and we discussed the procedure of seed localization including benefits and alternatives. We discussed the high likelihood of a successful procedure. We discussed the risks of the procedure including infection, bleeding, tissue injury and further surgery. We discussed the low dose of radioactivity involved in the procedure. Informed, written consent was given. The usual time-out protocol was performed immediately prior to the procedure. Site 1: Using ultrasound guidance, sterile technique, 1% lidocaine and an I-125 radioactive seed, right axillary lymph node was localized using a lateral approach. The follow-up mammogram images confirm the seed in the expected location and were marked for Dr. [name]. Follow-up survey of the patient confirms presence of the radioactive seed. Order number of I-125 seed:  [id number]. Total activity:  3.614 millicuries reference Date: 01/28/2020 Site 2: Using ultrasound guidance, sterile technique, 1% lidocaine and an I-125 radioactive seed, right intramammary lymph node was localized using a lateral approach. The follow-up mammogram images confirm the seed in the  expected location and  were marked for Dr. [name]. Follow-up survey of the patient confirms presence of the radioactive seed. Order number of I-125 seed:  [id number]. Total activity:  4.166 millicuries reference Date: 01/28/2020 The patient tolerated the procedure well and was released from the [facility]. She was given instructions regarding seed removal. IMPRESSION: Radioactive seed localization right breast. No apparent complications. Note two radioactive seeds were placed. One within a right axillary lymph node and one within a right intramammary lymph node. Electronically Signed   By: Lovey Newcomer M.D.   On: 02/27/2020 16:21   [place] RT RADIOACTIVE SEED EA ADD LESION  Result Date: 02/27/2020 CLINICAL DATA:  Patient for preoperative localization prior to bilateral mastectomies and targeted right lymph node resection. EXAM: ULTRASOUND GUIDED RADIOACTIVE SEED LOCALIZATION OF THE RIGHT BREAST COMPARISON:  Previous exam(s). FINDINGS: Patient presents for radioactive seed localization prior to right targeted lymph node resection. I met with the patient and we discussed the procedure of seed localization including benefits and alternatives. We discussed the high likelihood of a successful procedure. We discussed the risks of the procedure including infection, bleeding, tissue injury and further surgery. We discussed the low dose of radioactivity involved in the procedure. Informed, written consent was given. The usual time-out protocol was performed immediately prior to the procedure. Site 1: Using ultrasound guidance, sterile technique, 1% lidocaine and an I-125 radioactive seed, right axillary lymph node was localized using a lateral approach. The follow-up mammogram images confirm the seed in the expected location and were marked for Dr. [name]. Follow-up survey of the patient confirms presence of the radioactive seed. Order number of I-125 seed:  [id number]. Total activity:  9.323 millicuries reference Date: 01/28/2020 Site 2: Using  ultrasound guidance, sterile technique, 1% lidocaine and an I-125 radioactive seed, right intramammary lymph node was localized using a lateral approach. The follow-up mammogram images confirm the seed in the expected location and were marked for Dr. [name]. Follow-up survey of the patient confirms presence of the radioactive seed. Order number of I-125 seed:  [id number]. Total activity:  4.270 millicuries reference Date: 01/28/2020 The patient tolerated the procedure well and was released from the [facility]. She was given instructions regarding seed removal. IMPRESSION: Radioactive seed localization right breast. No apparent complications. Note two radioactive seeds were placed. One within a right axillary lymph node and one within a right intramammary lymph node. Electronically Signed   By: Lovey Newcomer M.D.   On: 02/27/2020 16:21   MM CLIP PLACEMENT RIGHT  Result Date: 02/27/2020 CLINICAL DATA:  Patient status post radioactive seed localization of right axillary lymph node and right intramammary lymph node. EXAM: DIAGNOSTIC RIGHT MAMMOGRAM POST ULTRASOUND-GUIDED RADIOACTIVE SEED PLACEMENT COMPARISON:  Previous exam(s). FINDINGS: Mammographic images were obtained following ultrasound-guided radioactive seed placement. These demonstrate the radioactive seed to be located appropriately within the right intramammary lymph node and the radioactive seed to be located appropriately within the right axillary lymph node. IMPRESSION: Appropriate location of the radioactive seeds, two sites. Final Assessment: Post Procedure Mammograms for Seed Placement Electronically Signed   By: Lovey Newcomer M.D.   On: 02/27/2020 16:25    Anti-infectives: Anti-infectives (From admission, onward)   Start     Dose/Rate Route Frequency Ordered Stop   02/28/20 0645  ceFAZolin (ANCEF) IVPB 2g/100 mL premix        2 g 200 mL/hr over 30 Minutes Intravenous On call to O.R. 02/28/20 [id number] 02/28/20 0845      Assessment/Plan: s/p  Procedure(s) with  comments: BILATERAL MASTECTOMY WITH RIGHT SENTINEL LYMPH NODE MAPPING AND RIGHT TARGETED RADIOACTIVE SEED GUIDED  LYMPH NODE DISSECTION (Bilateral) - PEC BLOCK, RNFA ([facility]) Advance diet Discharge  LOS: 0 days    [name] 02/29/2020

## 2020-03-03 LAB — SURGICAL PATHOLOGY

## 2020-03-05 ENCOUNTER — Encounter: Payer: Self-pay | Admitting: *Deleted

## 2020-03-05 NOTE — Progress Notes (Signed)
Patient Care Team: Deland Pretty, MD as PCP - General (Internal Medicine) Mauro Kaufmann, RN as Medical Oncologist Rockwell Germany, RN as Oncology Nurse Navigator  DIAGNOSIS:    ICD-10-CM   1. Malignant neoplasm of lower-outer quadrant of right breast of female, estrogen receptor positive ([place])  C50.511    Z17.0     SUMMARY OF ONCOLOGIC HISTORY: Oncology History  Malignant neoplasm of lower-outer quadrant of right breast of female, estrogen receptor positive ([place])  08/01/2019 Initial Diagnosis   Palpable right breast mass for 1 month.  Mammogram revealed a 5 cm mass 8:30 position, 3 cm intramammary lymph node at 10 o'clock position and 2 enlarged right axillary lymph nodes.  Biopsy revealed grade 1 IDC ER 90%, PR 10%, Ki-67 10%, HER-2 negative.  Intramammary node and axillary lymph node both positive with similar prognostic profile   08/08/2019 -  Neo-Adjuvant Anti-estrogen oral therapy   Anastrozole while deciding treatment plan    02/28/2020 Surgery   Bilateral mastectomies [name]):  Right breast: IDC, 6.5cm, metastatic carcinoma in 6/7 right axillary lymph nodes, 1.5cm, 1.5cm, and 0.3cm, and one intramammary lymph node, 2.5cm Left breast: no evidence of malignancy in the breast or 1 left axillary lymph node.     CHIEF COMPLIANT: Follow-up s/p bilateral mastectomies   INTERVAL HISTORY: [name] is a 65 y.o. with above-mentioned history of right breast cancercurrently on neoadjuvant antiestrogen therapy with anastrozole.She underwent a bilateral mastectomies with Dr. [name] on 02/28/20 for which pathology showed in the right breast, invasive ductal carcinoma, 6.5cm, metastatic carcinoma in 6/7 right axillary lymph nodes, 1.5cm, 1.5cm, and 0.3cm, and one intramammary lymph node, 2.5cm. In the left breast, no evidence of malignancy in the breast or 1 left axillary lymph node. She presents to the clinic todayto discuss further treatment.   ALLERGIES:  has No Known  Allergies.  MEDICATIONS:  Current Outpatient Medications  Medication Sig Dispense Refill   anastrozole (ARIMIDEX) 1 MG tablet Take 1 tablet (1 mg total) by mouth daily. 90 tablet 3   calcium carbonate (OS-CAL) 1250 (500 Ca) MG chewable tablet Chew 1 tablet by mouth daily.     cholecalciferol (VITAMIN D3) 25 MCG (1000 UNIT) tablet Take 1,000 Units by mouth daily.     HYDROcodone-acetaminophen (NORCO/VICODIN) 5-325 MG tablet Take 1-2 tablets by mouth every 6 (six) hours as needed for moderate pain or severe pain. 15 tablet 0   levothyroxine (SYNTHROID) 125 MCG tablet Take 1 tablet (125 mcg total) by mouth daily before breakfast.     Multiple Vitamins-Minerals (MULTIVITAMIN WITH MINERALS) tablet Take 1 tablet by mouth daily.     rosuvastatin (CRESTOR) 10 MG tablet Take 1 tablet (10 mg total) by mouth daily.     No current facility-administered medications for this visit.    PHYSICAL EXAMINATION: ECOG PERFORMANCE STATUS: 1 - Symptomatic but completely ambulatory  Vitals:   03/06/20 1146  BP: 131/62  Pulse: 76  Resp: 18  Temp: 97.7 F (36.5 C)  SpO2: 99%   Filed Weights   03/06/20 1146  Weight: 148 lb (67.1 kg)     LABORATORY DATA:  I have reviewed the data as listed CMP Latest Ref Rng & Units 02/25/2020 08/23/2019 06/12/2007  Glucose 70 - 99 mg/dL 111(H) 122(H) 111(H)  BUN 8 - 23 mg/dL '14 13 10  ' Creatinine 0.44 - 1.00 mg/dL 0.70 0.83 0.72  Sodium 135 - 145 mmol/L 140 143 137  Potassium 3.5 - 5.1 mmol/L 3.8 4.4 3.7  Chloride 98 -  111 mmol/L 103 105 105  CO2 22 - 32 mmol/L '28 27 26  ' Calcium 8.9 - 10.3 mg/dL 9.5 9.9 8.9  Total Protein 6.5 - 8.1 g/dL - 7.9 -  Total Bilirubin 0.3 - 1.2 mg/dL - 0.5 -  Alkaline Phos 38 - 126 U/L - 66 -  AST 15 - 41 U/L - 16 -  ALT 0 - 44 U/L - 16 -    Lab Results  Component Value Date   WBC 7.3 02/25/2020   HGB 13.4 02/25/2020   HCT 41.0 02/25/2020   MCV 90.9 02/25/2020   PLT 260 02/25/2020   NEUTROABS 5.4 06/12/2007     ASSESSMENT & PLAN:  Malignant neoplasm of lower-outer quadrant of right breast of female, estrogen receptor positive ([place]) 08/01/2019:Palpable right breast mass for 1 month. Mammogram revealed a 5 cm mass 8:30 position, 3 cm intramammary lymph node at 10 o'clock position and 2 enlarged right axillary lymph nodes. Biopsy revealed grade 1 IDC ER 90%, PR 10%, Ki-67 10%, HER-2 negative. Intramammary node and axillary lymph node both positive with similar prognostic profile  Breast MRI 08/15/2019: Biopsy-proven malignancy lower outer quadrant right breast, non-mass enhancement spanning 8.7 cm with probable invasion of underlying pectoral muscle. Biopsy-proven metastatic lymph node in the right axillary tail. Mild asymmetric right infraclavicular adenopathy.Biopsy positive for invasive ductal carcinoma grade 1  CT CAP 08/24/2019: Right axillary and right retropectoral adenopathy compatible with nodal metastases. Indistinct sclerotic 1.2 cm left acetabular lesion. No other distant metastases MammaPrint: Luminal type A, low risk, no benefit to chemotherapy Bone scan: Negative for metastatic disease  Current treatment: 1.Neoadjuvant antiestrogen therapy with anastrozole 1 mg daily started 08/08/2019 2.bilateral mastectomies 02/28/2020: Right mastectomy with ALND: IDC 6.5 cm, 7/8 lymph nodes positive; left mastectomy: Benign ER 90%, PR 10%, Ki-67 10%, HER2 negative 3. given the fact that the patient had 7 positive lymph nodes, the standard of care would be to offer adjuvant chemotherapy.  I recommended dose dense Adriamycin and Cytoxan followed by Taxol. 4.  Adj radiation 5.  Followed by adjuvant antiestrogen therapy with the CDK 4 and 6 inhibitor  Pathology counseling: I discussed the final pathology report of the patient provided  a copy of this report. I discussed the margins as well as lymph node surgeries. We also discussed the final staging along with previously performed ER/PR and  HER-2/neu testing.  Depending on if she needs the axillary node dissection, chemotherapy will be planned.    No orders of the defined types were placed in this encounter.  The patient has a good understanding of the overall plan. she agrees with it. she will call with any problems that may develop before the next visit here.  Total time spent: 30 mins including face to face time and time spent for planning, charting and coordination of care  [name], MD 03/06/2020  I, Cloyde Reams Dorshimer, am acting as scribe for Dr. [name].  I have reviewed the above documentation for accuracy and completeness, and I agree with the above.

## 2020-03-06 ENCOUNTER — Encounter: Payer: Self-pay | Admitting: *Deleted

## 2020-03-06 ENCOUNTER — Other Ambulatory Visit: Payer: Self-pay

## 2020-03-06 ENCOUNTER — Inpatient Hospital Stay: Payer: Commercial Managed Care - PPO | Attending: Hematology and Oncology | Admitting: Hematology and Oncology

## 2020-03-06 ENCOUNTER — Encounter: Payer: Self-pay | Admitting: Radiology

## 2020-03-06 VITALS — BP 131/62 | HR 76 | Temp 97.7°F | Resp 18 | Ht 67.0 in | Wt 148.0 lb

## 2020-03-06 DIAGNOSIS — C773 Secondary and unspecified malignant neoplasm of axilla and upper limb lymph nodes: Secondary | ICD-10-CM | POA: Diagnosis not present

## 2020-03-06 DIAGNOSIS — C50511 Malignant neoplasm of lower-outer quadrant of right female breast: Secondary | ICD-10-CM

## 2020-03-06 DIAGNOSIS — Z170 Estrogen receptor positive status [ER+]: Secondary | ICD-10-CM | POA: Diagnosis not present

## 2020-03-06 DIAGNOSIS — Z9013 Acquired absence of bilateral breasts and nipples: Secondary | ICD-10-CM | POA: Insufficient documentation

## 2020-03-06 MED ORDER — DEXAMETHASONE 4 MG PO TABS: 8.0000 mg | ORAL_TABLET | Freq: Every day | ORAL | 1 refills | Status: DC

## 2020-03-06 MED ORDER — LIDOCAINE-PRILOCAINE 2.5-2.5 % EX CREA: TOPICAL_CREAM | CUTANEOUS | 3 refills | Status: DC

## 2020-03-06 MED ORDER — ONDANSETRON HCL 8 MG PO TABS: 8.0000 mg | ORAL_TABLET | Freq: Two times a day (BID) | ORAL | 1 refills | Status: DC | PRN

## 2020-03-06 MED ORDER — PROCHLORPERAZINE MALEATE 10 MG PO TABS: 10.0000 mg | ORAL_TABLET | Freq: Four times a day (QID) | ORAL | 1 refills | Status: DC | PRN

## 2020-03-06 NOTE — Assessment & Plan Note (Signed)
08/01/2019:Palpable right breast mass for 1 month. Mammogram revealed a 5 cm mass 8:30 position, 3 cm intramammary lymph node at 10 o'clock position and 2 enlarged right axillary lymph nodes. Biopsy revealed grade 1 IDC ER 90%, PR 10%, Ki-67 10%, HER-2 negative. Intramammary node and axillary lymph node both positive with similar prognostic profile  Breast MRI 08/15/2019: Biopsy-proven malignancy lower outer quadrant right breast, non-mass enhancement spanning 8.7 cm with probable invasion of underlying pectoral muscle. Biopsy-proven metastatic lymph node in the right axillary tail. Mild asymmetric right infraclavicular adenopathy.Biopsy positive for invasive ductal carcinoma grade 1  CT CAP 08/24/2019: Right axillary and right retropectoral adenopathy compatible with nodal metastases. Indistinct sclerotic 1.2 cm left acetabular lesion. No other distant metastases MammaPrint: Luminal type A, low risk, no benefit to chemotherapy Bone scan: Negative for metastatic disease  Current treatment: 1.Neoadjuvant antiestrogen therapy with anastrozole 1 mg daily started 08/08/2019 2.bilateral mastectomies 02/28/2020: Right mastectomy with ALND: IDC 6.5 cm, 7/8 lymph nodes positive; left mastectomy: Benign ER 90%, PR 10%, Ki-67 10%, HER2 negative 3.  Adj radiation 4.  Followed by adjuvant antiestrogen therapy with the CDK 4 and 6 inhibitor  Pathology counseling: I discussed the final pathology report of the patient provided  a copy of this report. I discussed the margins as well as lymph node surgeries. We also discussed the final staging along with previously performed ER/PR and HER-2/neu testing.  Return to clinic after radiation.

## 2020-03-06 NOTE — Research (Signed)
[place] [address]: TREATMENT OF REFRACTORY NAUSEA  03/06/20      12:40PM  REFERRAL VISIT: Patient [name] was identified by Dr. Lindi Adie as a potential candidate for the above listed study.  This Clinical Research Coordinator met with [name], [id number], on 03/06/20 in a manner and location that ensures patient privacy to discuss participation in the above listed research study. Patient is accompanied by her husband [name].  A copy of the informed consent document and separate HIPAA Authorization was provided to the patient.  Patient reads, speaks, and understands [place].   Patient was provided with the business card of this Coordinator and encouraged to contact the research team with any questions.  Approximately 20 minutes were spent with the patient reviewing the informed consent documents.  Patient was provided the option of taking informed consent documents home to review and was encouraged to review at their convenience with their support network, including other care providers. Patient took the consent documents home to review.  Patient plans on meeting with Dr. [name] (surgeon) to confirm she doesn't need any further surgeries prior to starting chemo. This clinical research coordinator plans on speaking with patient once she has her visit with Dr. [name] to verify interest and see if any further surgery is required. At this time, [name] and [name] did not have any questions. Patient and her husband were thanked for their time and consideration for this study.  Carol Ada, RT(R)(T) Clinical Research Coordinator

## 2020-03-06 NOTE — Progress Notes (Signed)
START ON PATHWAY REGIMEN - Breast     Cycles 1 through 4: A cycle is every 14 days:     Doxorubicin      Cyclophosphamide      Pegfilgrastim-xxxx    Cycles 5 through 16: A cycle is every 7 days:     Paclitaxel   **Always confirm dose/schedule in your pharmacy ordering system**  Patient Characteristics: Postoperative without Neoadjuvant Therapy (Pathologic Staging), Invasive Disease, Adjuvant Therapy, HER2 Negative/Unknown/Equivocal, ER Positive, Node Positive, Node Positive (4+) Therapeutic Status: Postoperative without Neoadjuvant Therapy (Pathologic Staging) AJCC Grade: G2 AJCC N Category: pN2a AJCC M Category: cM0 ER Status: Positive (+) AJCC 8 Stage Grouping: IB HER2 Status: Negative (-) Oncotype Dx Recurrence Score: Not Appropriate AJCC T Category: pT3 PR Status: Positive (+) Adjuvant Therapy Status: No Adjuvant Therapy Received Yet or Changing Initial Adjuvant Regimen due to Tolerance Intent of Therapy: Curative Intent, Discussed with Patient

## 2020-03-10 ENCOUNTER — Encounter: Payer: Self-pay | Admitting: *Deleted

## 2020-03-11 ENCOUNTER — Ambulatory Visit: Payer: Self-pay | Admitting: General Surgery

## 2020-03-11 ENCOUNTER — Telehealth: Payer: Self-pay | Admitting: Hematology and Oncology

## 2020-03-11 NOTE — Telephone Encounter (Signed)
Per 1/20 los, no changes made to pt schedule  

## 2020-03-13 ENCOUNTER — Encounter: Payer: Self-pay | Admitting: *Deleted

## 2020-03-13 ENCOUNTER — Telehealth: Payer: Self-pay | Admitting: Radiology

## 2020-03-13 NOTE — Telephone Encounter (Signed)
[place] [address]: TREATMENT OF REFRACTORY NAUSEA  03/13/20     9:00AM  PHONE CALL: Confirmed I was speaking with [name]. Informed patient reason for call was to discuss interest in participating in the above mentioned trial. Patient stated she had read over the consent and is interested in participating. The plan is to meet with patient after patient education on Monday 03/17/20. Thanked patient for her time and I look forward to meeting with her again soon.   Carol Ada, RT(R)(T) Clinical Research Coordinator

## 2020-03-17 ENCOUNTER — Encounter (HOSPITAL_BASED_OUTPATIENT_CLINIC_OR_DEPARTMENT_OTHER): Payer: Self-pay | Admitting: General Surgery

## 2020-03-17 ENCOUNTER — Inpatient Hospital Stay: Payer: Commercial Managed Care - PPO | Admitting: Radiology

## 2020-03-17 ENCOUNTER — Other Ambulatory Visit: Payer: Self-pay

## 2020-03-17 ENCOUNTER — Ambulatory Visit (HOSPITAL_COMMUNITY)
Admission: RE | Admit: 2020-03-17 | Discharge: 2020-03-17 | Disposition: A | Discharge status: Home / Self Care | Payer: Commercial Managed Care - PPO | Source: Ambulatory Visit | Attending: Hematology and Oncology | Admitting: Hematology and Oncology

## 2020-03-17 ENCOUNTER — Inpatient Hospital Stay: Payer: Commercial Managed Care - PPO

## 2020-03-17 DIAGNOSIS — Z170 Estrogen receptor positive status [ER+]: Secondary | ICD-10-CM

## 2020-03-17 DIAGNOSIS — C50511 Malignant neoplasm of lower-outer quadrant of right female breast: Secondary | ICD-10-CM

## 2020-03-17 DIAGNOSIS — Z0189 Encounter for other specified special examinations: Secondary | ICD-10-CM

## 2020-03-17 DIAGNOSIS — I082 Rheumatic disorders of both aortic and tricuspid valves: Secondary | ICD-10-CM | POA: Insufficient documentation

## 2020-03-17 DIAGNOSIS — E039 Hypothyroidism, unspecified: Secondary | ICD-10-CM | POA: Insufficient documentation

## 2020-03-17 DIAGNOSIS — E785 Hyperlipidemia, unspecified: Secondary | ICD-10-CM | POA: Diagnosis not present

## 2020-03-17 DIAGNOSIS — Z01818 Encounter for other preprocedural examination: Secondary | ICD-10-CM | POA: Diagnosis present

## 2020-03-17 LAB — ECHOCARDIOGRAM COMPLETE
Area-P 1/2: 3.03 cm2
P 1/2 time: 414 msec
S' Lateral: 3.4 cm

## 2020-03-17 NOTE — Research (Signed)
[place] [address]: TREATMENT OF REFRACTORY NAUSEA  03/17/20      11:15AM  Patient [name] was identified by Dr. Lindi Adie as a potential candidate for the above listed study.  This Clinical Research Coordinator met with [name], [id number] on 03/17/20 in a manner and location that ensures patient privacy to discuss participation in the above listed research study.  Patient is Accompanied by her husband.  Patient was previously provided with informed consent documents.  Patient confirmed they have read the informed consent documents.  As outlined in the informed consent form, this Coordinator and Quillian Quince discussed the purpose of the research study, the investigational nature of the study, study procedures and requirements for study participation, potential risks and benefits of study participation, as well as alternatives to participation.  The patient understands participation is voluntary and they may withdraw from study participation at any time.  Each study arm was reviewed, and randomization discussed.  Potential side effects were reviewed with patient as outlined in the consent form, and patient made aware there may be side effects not yet known. The chance of receiving placebo was discussed.It was also discussed that this study involves double-blinding (if patient continues to part 2), so Dr.Gudena nor this clinical research coordinator are aware of which arm patient will possibly be randomized to. Patient understands enrollment is pending full eligibility review.   Confidentiality and how the patient's information will be used as part of study participation were discussed.  Patient was informed there is not reimbursement provided for their time and effort spent on trial participation.  The patient is encouraged to discuss research study participation with their insurance provider to determine what costs they may incur as part of study participation, including research related injury.     All questions were answered to patient's satisfaction.  The informed consent and separate HIPAA Authorization was reviewed page by page.  The patient's mental and emotional status is appropriate to provide informed consent, and the patient verbalizes an understanding of study participation.  Patient has agreed to participate in the above listed research study and has voluntarily signed the informed consent version and separate HIPAA Authorization, version dated 09/01/2018, [place] Active Date 06/12/2019 on 03/17/20 at 11:32AMAM.  The patient was provided with a copy of the signed informed consent form and separate HIPAA Authorization for their reference.  No study specific procedures were obtained prior to the signing of the informed consent document.  Approximately 30 minutes were spent with the patient reviewing the informed consent documents.  After obtaining informed consent patient, voluntarily signed the optional Release of Information form for use throughout trial participation. Patient was given a copy of baseline questionnaires to complete and return on first day of infusion and agreed to receive questionnaires by e-mail to [email or web address]. Patient was thanked for her time and willingness to participate in the above mentioned study.   Carol Ada, RT(R)(T) Clinical Research Coordinator

## 2020-03-17 NOTE — Progress Notes (Signed)
  Echocardiogram 2D Echocardiogram has been performed.  [name] 03/17/2020, 9:59 AM

## 2020-03-17 NOTE — Research (Signed)
DCP-001: USE OF CLINICAL TRIAL SCREENING TOOL TO ADDRESS CANCER HEALTH DISPARITIES IN THE [facility]  03/17/2020       11:45AM   Patient was provided the option of taking informed consent documents home to review and was encouraged to review at their convenience with their support network, including other care providers. Patient is comfortable with making a decision regarding study participation today.  As outlined in the informed consent form, this Coordinator and Quillian Quince discussed the purpose of the research study, the investigational nature of the study, study procedures and requirements for study participation, potential risks and benefits of study participation, as well as alternatives to participation.  The patient understands participation is voluntary and they may withdraw from study participation at any time.  This study does not involve randomization.  This study does not involve an investigational drug or device. This study does not involve a placebo. Patient understands enrollment is pending full eligibility review.   Confidentiality and how the patient's information will be used as part of study participation were discussed.  Patient was informed there is not reimbursement provided for their time and effort spent on trial participation.  The patient is encouraged to discuss research study participation with their insurance provider to determine what costs they may incur as part of study participation, including research related injury.    All questions were answered to patient's satisfaction.  The informed consent and separate HIPAA Authorization was reviewed page by page.  The patient's mental and emotional status is appropriate to provide informed consent, and the patient verbalizes an understanding of study participation.  Patient has agreed to participate in the above listed research study and has voluntarily signed the informed consent version and separate HIPAA Authorization, version  dated 08/08/2018, [place] Active Date: 02/20/2019; updated HIPAA version 5, date approved 03/10/2020 on 03/17/20 at 11:45 AM.  The patient was provided with a copy of the signed informed consent form and separate HIPAA Authorization for their reference.  No study specific procedures were obtained prior to the signing of the informed consent document.  Approximately 10 minutes were spent with the patient reviewing the informed consent documents.  After obtaining informed consent patient, voluntarily signed the optional Release of Information form for use throughout trial participation. Patient was asked all questions and answers were recorded. Patient was thanked for her time and participation in the above mentioned study as well as [place].   Carol Ada, RT(R)(T) Clinical Research Coordinator

## 2020-03-18 ENCOUNTER — Other Ambulatory Visit (HOSPITAL_COMMUNITY)
Admission: RE | Admit: 2020-03-18 | Discharge: 2020-03-18 | Disposition: A | Discharge status: Home / Self Care | Payer: Commercial Managed Care - PPO | Source: Ambulatory Visit | Attending: General Surgery | Admitting: General Surgery

## 2020-03-18 DIAGNOSIS — Z01812 Encounter for preprocedural laboratory examination: Secondary | ICD-10-CM | POA: Insufficient documentation

## 2020-03-18 DIAGNOSIS — Z20822 Contact with and (suspected) exposure to covid-19: Secondary | ICD-10-CM | POA: Diagnosis not present

## 2020-03-18 LAB — SARS CORONAVIRUS 2 (TAT 6-24 HRS): SARS Coronavirus 2: NEGATIVE

## 2020-03-18 NOTE — Progress Notes (Signed)

## 2020-03-19 ENCOUNTER — Encounter: Payer: Self-pay | Admitting: Hematology and Oncology

## 2020-03-19 NOTE — Progress Notes (Signed)
Pharmacist Chemotherapy Monitoring - Initial Assessment    Anticipated start date: 03/26/20   Regimen:  . Are orders appropriate based on the patient's diagnosis, regimen, and cycle? Yes . Does the plan date match the patient's scheduled date? Yes . Is the sequencing of drugs appropriate? Yes . Are the premedications appropriate for the patient's regimen? Yes . Prior Authorization for treatment is: Pending o If applicable, is the correct biosimilar selected based on the patient's insurance? not applicable  Organ Function and Labs: . Are dose adjustments needed based on the patient's renal function, hepatic function, or hematologic function? Yes . Are appropriate labs ordered prior to the start of patient's treatment? Yes . Other organ system assessment, if indicated: anthracyclines: Echo/ MUGA . The following baseline labs, if indicated, have been ordered: N/A  Dose Assessment: . Are the drug doses appropriate? Yes . Are the following correct: o Drug concentrations Yes o IV fluid compatible with drug Yes o Administration routes Yes o Timing of therapy Yes . If applicable, does the patient have documented access for treatment and/or plans for port-a-cath placement? not applicable . If applicable, have lifetime cumulative doses been properly documented and assessed? not applicable Lifetime Dose Tracking  No doses have been documented on this patient for the following tracked chemicals: Doxorubicin, Epirubicin, Idarubicin, Daunorubicin, Mitoxantrone, Bleomycin, Oxaliplatin, Carboplatin, Liposomal Doxorubicin  o   Toxicity Monitoring/Prevention: . The patient has the following take home antiemetics prescribed: Ondansetron, Prochlorperazine and Dexamethasone . The patient has the following take home medications prescribed: N/A . Medication allergies and previous infusion related reactions, if applicable, have been reviewed and addressed. Yes . The patient's current medication list has  been assessed for drug-drug interactions with their chemotherapy regimen. no significant drug-drug interactions were identified on review.  Order Review: . Are the treatment plan orders signed? Yes . Is the patient scheduled to see a provider prior to their treatment? Yes  I verify that I have reviewed each item in the above checklist and answered each question accordingly.  @RPHNAME@  

## 2020-03-19 NOTE — Progress Notes (Signed)
Spoke w/ pt on 03/18/20 to introduce myself as her Arboriculturist, discuss copay assistance and the [employer].  Pt requested that I talk to her husband because he takes care of those things so I spoke w/ him and he stated pt has met her deductible and he paid for her OOP for the year so her insurance should pay for treatment at 100% so he would like to wait on applying for copay assistance for the Fulphila injection.  I also offered the [employer], went over what it covers and gave him the income requirement.  He stated they exceed the requirement so she doesn't qualify for the grant at this time.  I will give her my card on 03/26/20 in case she would like to apply for copay assistance later and for any questions or concerns they may have in the future.

## 2020-03-20 ENCOUNTER — Other Ambulatory Visit: Payer: Self-pay | Admitting: *Deleted

## 2020-03-20 ENCOUNTER — Other Ambulatory Visit: Payer: Self-pay | Admitting: Pharmacist

## 2020-03-20 DIAGNOSIS — Z006 Encounter for examination for normal comparison and control in clinical research program: Secondary | ICD-10-CM

## 2020-03-21 ENCOUNTER — Encounter (HOSPITAL_BASED_OUTPATIENT_CLINIC_OR_DEPARTMENT_OTHER)
Admission: RE | Disposition: A | Discharge status: Home / Self Care | Payer: Self-pay | Source: Home / Self Care | Attending: General Surgery

## 2020-03-21 ENCOUNTER — Ambulatory Visit (HOSPITAL_BASED_OUTPATIENT_CLINIC_OR_DEPARTMENT_OTHER): Payer: Commercial Managed Care - PPO | Admitting: Certified Registered"

## 2020-03-21 ENCOUNTER — Other Ambulatory Visit: Payer: Self-pay

## 2020-03-21 ENCOUNTER — Ambulatory Visit (HOSPITAL_COMMUNITY): Payer: Commercial Managed Care - PPO

## 2020-03-21 ENCOUNTER — Ambulatory Visit (HOSPITAL_BASED_OUTPATIENT_CLINIC_OR_DEPARTMENT_OTHER)
Admission: RE | Admit: 2020-03-21 | Discharge: 2020-03-21 | Disposition: A | Discharge status: Home / Self Care | Payer: Commercial Managed Care - PPO | Attending: General Surgery | Admitting: General Surgery

## 2020-03-21 ENCOUNTER — Encounter (HOSPITAL_BASED_OUTPATIENT_CLINIC_OR_DEPARTMENT_OTHER): Payer: Self-pay | Admitting: General Surgery

## 2020-03-21 DIAGNOSIS — C50511 Malignant neoplasm of lower-outer quadrant of right female breast: Secondary | ICD-10-CM | POA: Diagnosis present

## 2020-03-21 DIAGNOSIS — Z170 Estrogen receptor positive status [ER+]: Secondary | ICD-10-CM | POA: Insufficient documentation

## 2020-03-21 DIAGNOSIS — Z9013 Acquired absence of bilateral breasts and nipples: Secondary | ICD-10-CM | POA: Diagnosis not present

## 2020-03-21 DIAGNOSIS — Z95828 Presence of other vascular implants and grafts: Secondary | ICD-10-CM

## 2020-03-21 HISTORY — DX: Other complications of anesthesia, initial encounter: T88.59XA

## 2020-03-21 HISTORY — DX: Nausea with vomiting, unspecified: R11.2

## 2020-03-21 HISTORY — DX: Other specified postprocedural states: Z98.890

## 2020-03-21 HISTORY — PX: PORTACATH PLACEMENT: SHX2246

## 2020-03-21 IMAGING — CR DG CHEST 1V PORT
2 series · 2 of 2 positions shown · non-contrast
Comparison: [DATE]

CLINICAL DATA: Left chest port placement, right breast cancer

EXAM:
PORTABLE CHEST 1 VIEW

[chest ap (1 of 2)]
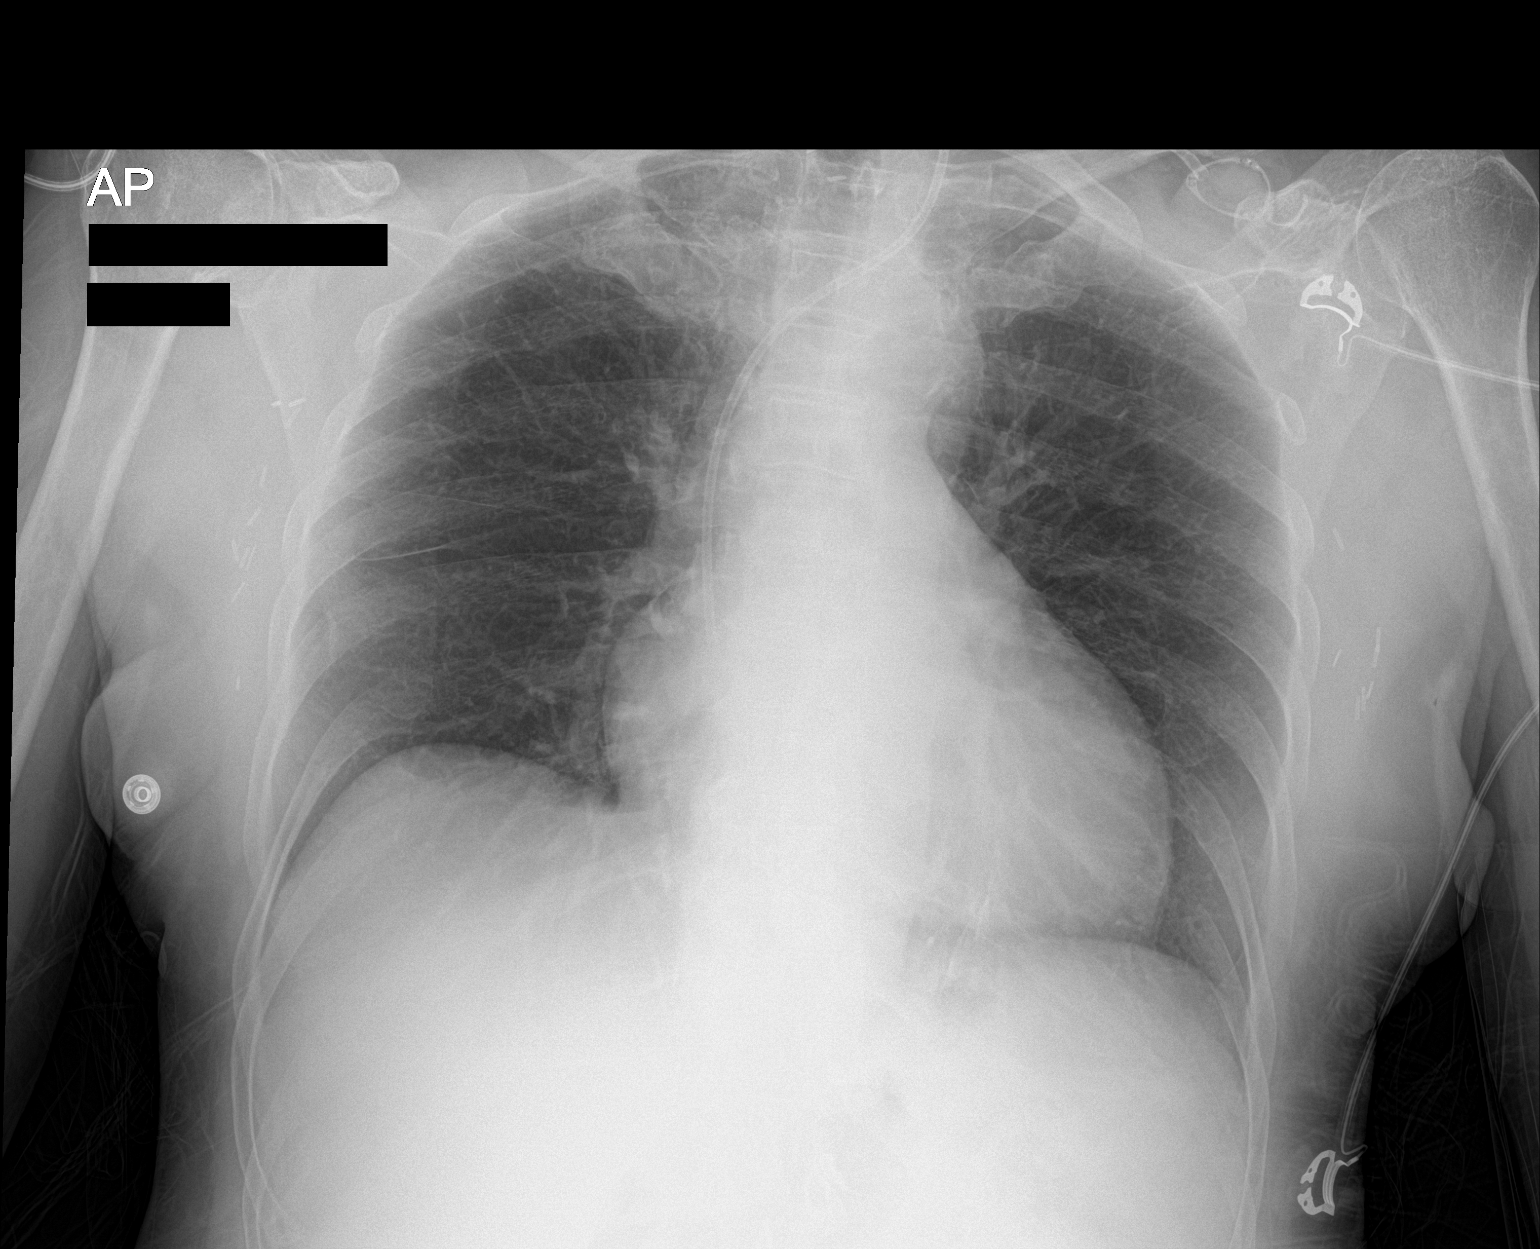

[chest ap (2 of 2)]
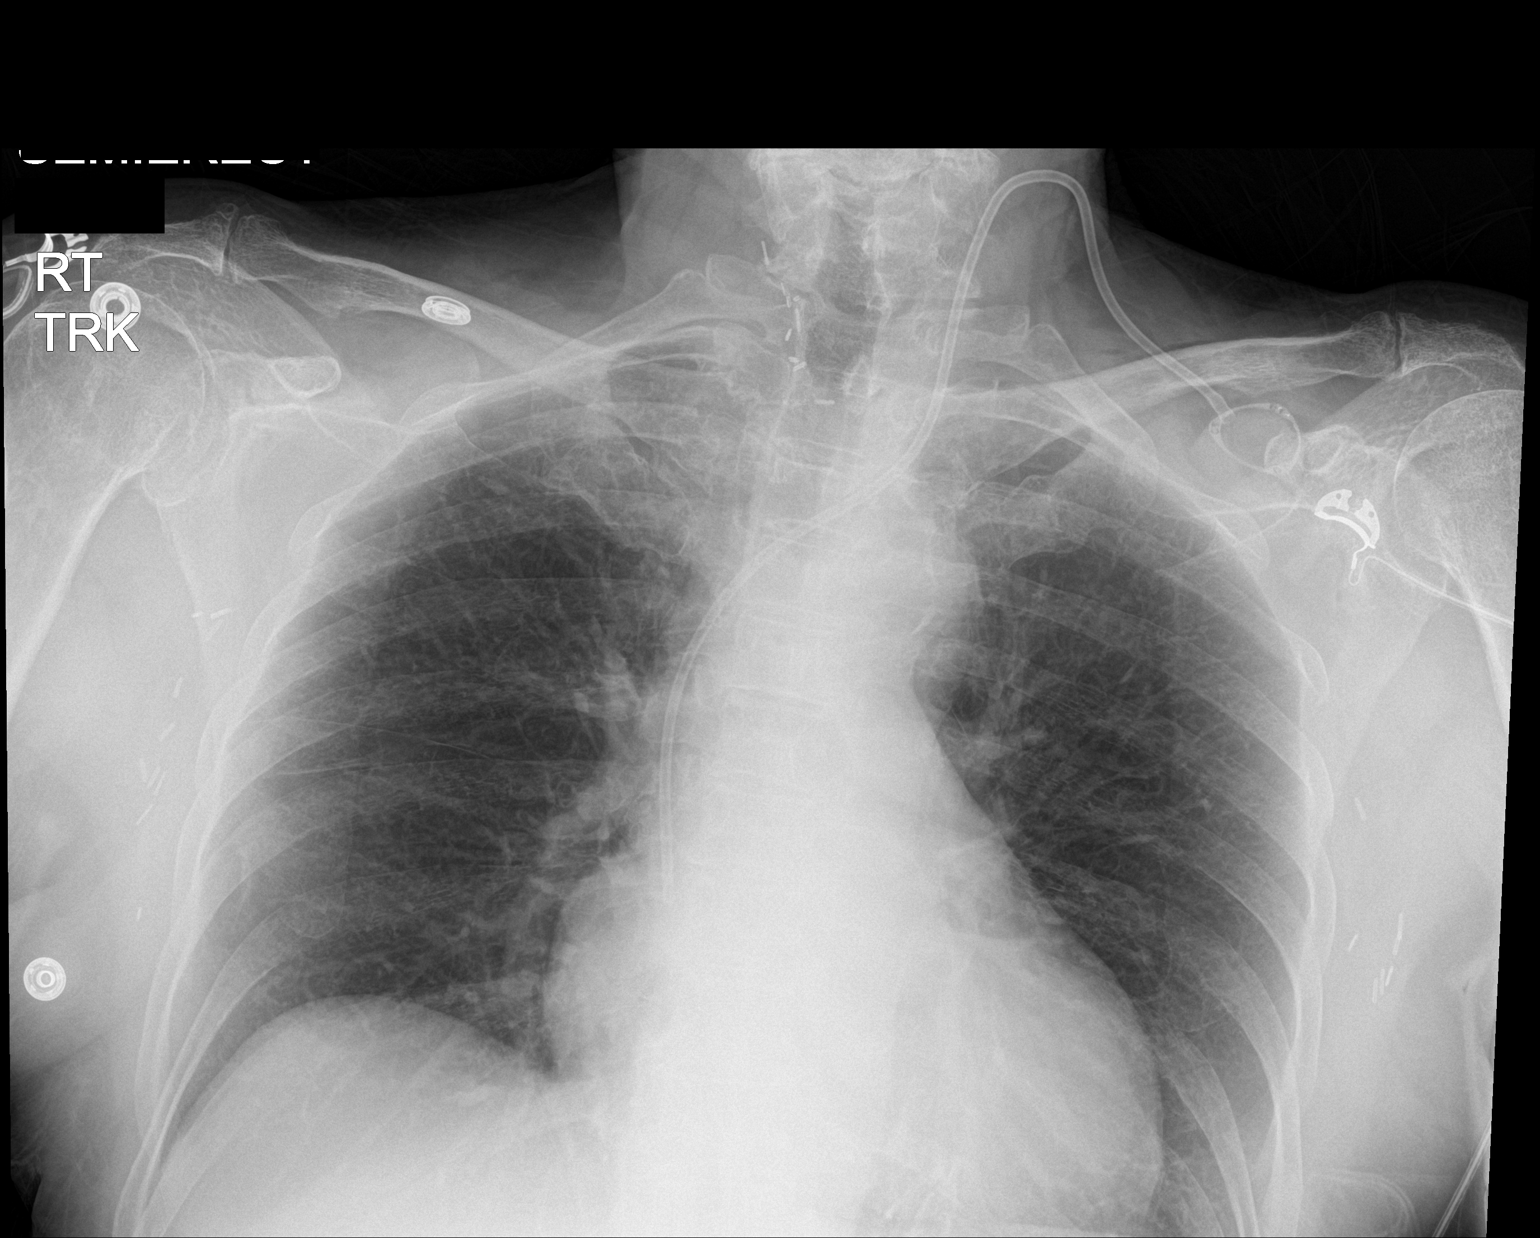

[2 of 2 positions shown; findings below may reference images not displayed]

FINDINGS: Two frontal views of the chest demonstrate left chest wall port via
internal jugular approach, tip at the atriocaval junction. Cardiac
silhouette is unremarkable. No airspace disease, effusion, or
pneumothorax. Postsurgical changes in the bilateral axillary
regions. No acute bony abnormalities.
IMPRESSION: 1. No complication after left chest wall port placement. No acute
intrathoracic process.

## 2020-03-21 SURGERY — INSERTION, TUNNELED CENTRAL VENOUS DEVICE, WITH PORT
Anesthesia: General | Site: Chest | Laterality: Left

## 2020-03-21 MED ORDER — CEFAZOLIN SODIUM-DEXTROSE 2-3 GM-%(50ML) IV SOLR
INTRAVENOUS | Status: DC | PRN
  Administered 2020-03-21: 2 g via INTRAVENOUS

## 2020-03-21 MED ORDER — GABAPENTIN 300 MG PO CAPS
300.0000 mg | ORAL_CAPSULE | ORAL | Status: AC
  Administered 2020-03-21: 300 mg via ORAL

## 2020-03-21 MED ORDER — AMISULPRIDE (ANTIEMETIC) 5 MG/2ML IV SOLN: 10.0000 mg | Freq: Once | INTRAVENOUS | Status: DC | PRN

## 2020-03-21 MED ORDER — BUPIVACAINE-EPINEPHRINE 0.25% -1:200000 IJ SOLN
INTRAMUSCULAR | Status: DC | PRN
  Administered 2020-03-21: 5 mL

## 2020-03-21 MED ORDER — LIDOCAINE HCL (CARDIAC) PF 100 MG/5ML IV SOSY
PREFILLED_SYRINGE | INTRAVENOUS | Status: DC | PRN
  Administered 2020-03-21: 60 mg via INTRATRACHEAL

## 2020-03-21 MED ORDER — HEPARIN SOD (PORK) LOCK FLUSH 100 UNIT/ML IV SOLN
INTRAVENOUS | Status: DC | PRN
  Administered 2020-03-21: 400 [IU] via INTRAVENOUS

## 2020-03-21 MED ORDER — MEPERIDINE HCL 25 MG/ML IJ SOLN: 6.2500 mg | INTRAMUSCULAR | Status: DC | PRN

## 2020-03-21 MED ORDER — ONDANSETRON HCL 4 MG/2ML IJ SOLN
INTRAMUSCULAR | Status: DC | PRN
  Administered 2020-03-21: 4 mg via INTRAVENOUS

## 2020-03-21 MED ORDER — ACETAMINOPHEN 500 MG PO TABS
ORAL_TABLET | ORAL | Status: AC
  Filled 2020-03-21: qty 2

## 2020-03-21 MED ORDER — ACETAMINOPHEN 500 MG PO TABS
1000.0000 mg | ORAL_TABLET | ORAL | Status: AC
  Administered 2020-03-21: 1000 mg via ORAL

## 2020-03-21 MED ORDER — FENTANYL CITRATE (PF) 100 MCG/2ML IJ SOLN
25.0000 ug | INTRAMUSCULAR | Status: DC | PRN
Start: 2020-03-21 — End: 2020-03-21

## 2020-03-21 MED ORDER — FENTANYL CITRATE (PF) 100 MCG/2ML IJ SOLN
INTRAMUSCULAR | Status: DC | PRN
  Administered 2020-03-21 (×2): 25 ug via INTRAVENOUS
  Administered 2020-03-21: 50 ug via INTRAVENOUS

## 2020-03-21 MED ORDER — PROPOFOL 10 MG/ML IV BOLUS
INTRAVENOUS | Status: DC | PRN
  Administered 2020-03-21: 110 mg via INTRAVENOUS

## 2020-03-21 MED ORDER — GABAPENTIN 300 MG PO CAPS
ORAL_CAPSULE | ORAL | Status: AC
  Filled 2020-03-21: qty 1

## 2020-03-21 MED ORDER — SCOPOLAMINE 1 MG/3DAYS TD PT72
MEDICATED_PATCH | TRANSDERMAL | Status: AC
  Filled 2020-03-21: qty 1

## 2020-03-21 MED ORDER — CEFAZOLIN SODIUM-DEXTROSE 2-4 GM/100ML-% IV SOLN: 2.0000 g | INTRAVENOUS | Status: DC

## 2020-03-21 MED ORDER — CELECOXIB 200 MG PO CAPS
200.0000 mg | ORAL_CAPSULE | ORAL | Status: AC
  Administered 2020-03-21: 200 mg via ORAL

## 2020-03-21 MED ORDER — CHLORHEXIDINE GLUCONATE CLOTH 2 % EX PADS: 6.0000 | MEDICATED_PAD | Freq: Once | CUTANEOUS | Status: DC

## 2020-03-21 MED ORDER — CEFAZOLIN SODIUM-DEXTROSE 2-4 GM/100ML-% IV SOLN
INTRAVENOUS | Status: AC
  Filled 2020-03-21: qty 100

## 2020-03-21 MED ORDER — HEPARIN (PORCINE) IN NACL 2-0.9 UNITS/ML
INTRAMUSCULAR | Status: AC | PRN
  Administered 2020-03-21: 500 mL via INTRAVENOUS

## 2020-03-21 MED ORDER — EPHEDRINE SULFATE 50 MG/ML IJ SOLN
INTRAMUSCULAR | Status: DC | PRN
  Administered 2020-03-21: 15 mg via INTRAVENOUS

## 2020-03-21 MED ORDER — SCOPOLAMINE 1 MG/3DAYS TD PT72
1.0000 | MEDICATED_PATCH | TRANSDERMAL | Status: DC
  Administered 2020-03-21: 1.5 mg via TRANSDERMAL

## 2020-03-21 MED ORDER — HEPARIN SOD (PORK) LOCK FLUSH 100 UNIT/ML IV SOLN
INTRAVENOUS | Status: AC
  Filled 2020-03-21: qty 5

## 2020-03-21 MED ORDER — MIDAZOLAM HCL 2 MG/2ML IJ SOLN
INTRAMUSCULAR | Status: DC | PRN
  Administered 2020-03-21: 2 mg via INTRAVENOUS

## 2020-03-21 MED ORDER — CELECOXIB 200 MG PO CAPS
ORAL_CAPSULE | ORAL | Status: AC
  Filled 2020-03-21: qty 1

## 2020-03-21 MED ORDER — MIDAZOLAM HCL 2 MG/2ML IJ SOLN
INTRAMUSCULAR | Status: AC
  Filled 2020-03-21: qty 2

## 2020-03-21 MED ORDER — LACTATED RINGERS IV SOLN: INTRAVENOUS | Status: DC | PRN

## 2020-03-21 MED ORDER — PROPOFOL 10 MG/ML IV BOLUS
INTRAVENOUS | Status: AC
  Filled 2020-03-21: qty 20

## 2020-03-21 MED ORDER — HEPARIN (PORCINE) IN NACL 1000-0.9 UT/500ML-% IV SOLN
INTRAVENOUS | Status: AC
  Filled 2020-03-21: qty 500

## 2020-03-21 MED ORDER — DIPHENHYDRAMINE HCL 50 MG/ML IJ SOLN
INTRAMUSCULAR | Status: DC | PRN
  Administered 2020-03-21: 12.5 mg via INTRAVENOUS

## 2020-03-21 MED ORDER — DEXAMETHASONE SODIUM PHOSPHATE 10 MG/ML IJ SOLN
INTRAMUSCULAR | Status: DC | PRN
  Administered 2020-03-21: 5 mg via INTRAVENOUS

## 2020-03-21 MED ORDER — FENTANYL CITRATE (PF) 100 MCG/2ML IJ SOLN
INTRAMUSCULAR | Status: AC
  Filled 2020-03-21: qty 2

## 2020-03-21 MED ORDER — HYDROCODONE-ACETAMINOPHEN 5-325 MG PO TABS: 1.0000 | ORAL_TABLET | Freq: Four times a day (QID) | ORAL | 0 refills | Status: DC | PRN

## 2020-03-21 MED ORDER — LACTATED RINGERS IV SOLN: INTRAVENOUS | Status: DC

## 2020-03-21 SURGICAL SUPPLY — 50 items
ADH SKN CLS APL DERMABOND .7 (GAUZE/BANDAGES/DRESSINGS) ×1
APL PRP STRL LF DISP 70% ISPRP (MISCELLANEOUS) ×1
BAG DECANTER FOR FLEXI CONT (MISCELLANEOUS) ×2 IMPLANT
BLADE SURG 15 STRL LF DISP TIS (BLADE) ×1 IMPLANT
BLADE SURG 15 STRL SS (BLADE) ×2
CANISTER SUCT 1200ML W/VALVE (MISCELLANEOUS) IMPLANT
CHLORAPREP W/TINT 26 (MISCELLANEOUS) ×2 IMPLANT
CLEANER CAUTERY TIP 5X5 PAD (MISCELLANEOUS) ×1 IMPLANT
COVER BACK TABLE 60X90IN (DRAPES) ×2 IMPLANT
COVER MAYO STAND STRL (DRAPES) ×2 IMPLANT
COVER PROBE 5X48 (MISCELLANEOUS) ×2
COVER WAND RF STERILE (DRAPES) IMPLANT
DECANTER SPIKE VIAL GLASS SM (MISCELLANEOUS) IMPLANT
DERMABOND ADVANCED (GAUZE/BANDAGES/DRESSINGS) ×1
DERMABOND ADVANCED .7 DNX12 (GAUZE/BANDAGES/DRESSINGS) ×1 IMPLANT
DRAPE C-ARM 42X72 X-RAY (DRAPES) ×2 IMPLANT
DRAPE LAPAROSCOPIC ABDOMINAL (DRAPES) ×2 IMPLANT
DRAPE UTILITY XL STRL (DRAPES) ×2 IMPLANT
ELECT REM PT RETURN 9FT ADLT (ELECTROSURGICAL) ×2
ELECTRODE REM PT RTRN 9FT ADLT (ELECTROSURGICAL) ×1 IMPLANT
GLOVE BIO SURGEON STRL SZ7.5 (GLOVE) ×2 IMPLANT
GLOVE SURG UNDER POLY LF SZ7 (GLOVE) ×1 IMPLANT
GOWN STRL REUS W/ TWL LRG LVL3 (GOWN DISPOSABLE) ×2 IMPLANT
GOWN STRL REUS W/TWL LRG LVL3 (GOWN DISPOSABLE) ×6
IV KIT MINILOC 20X1 SAFETY (NEEDLE) IMPLANT
KIT CVR 48X5XPRB PLUP LF (MISCELLANEOUS) IMPLANT
KIT PORT POWER 8FR ISP CVUE (Port) ×1 IMPLANT
NDL HYPO 25X1 1.5 SAFETY (NEEDLE) ×1 IMPLANT
NDL SAFETY ECLIPSE 18X1.5 (NEEDLE) IMPLANT
NDL SPNL 22GX3.5 QUINCKE BK (NEEDLE) IMPLANT
NEEDLE HYPO 18GX1.5 SHARP (NEEDLE)
NEEDLE HYPO 22GX1.5 SAFETY (NEEDLE) IMPLANT
NEEDLE HYPO 25X1 1.5 SAFETY (NEEDLE) ×2 IMPLANT
NEEDLE SPNL 22GX3.5 QUINCKE BK (NEEDLE) IMPLANT
PACK BASIN DAY SURGERY FS (CUSTOM PROCEDURE TRAY) ×2 IMPLANT
PAD CLEANER CAUTERY TIP 5X5 (MISCELLANEOUS) ×1
PENCIL SMOKE EVACUATOR (MISCELLANEOUS) ×2 IMPLANT
SLEEVE SCD COMPRESS KNEE MED (MISCELLANEOUS) ×1 IMPLANT
SUT MON AB 4-0 PC3 18 (SUTURE) ×2 IMPLANT
SUT PROLENE 2 0 SH DA (SUTURE) ×2 IMPLANT
SUT SILK 2 0 TIES 17X18 (SUTURE)
SUT SILK 2-0 18XBRD TIE BLK (SUTURE) IMPLANT
SUT VIC AB 3-0 SH 27 (SUTURE) ×2
SUT VIC AB 3-0 SH 27X BRD (SUTURE) ×1 IMPLANT
SYR 10ML LL (SYRINGE) IMPLANT
SYR 5ML LL (SYRINGE) ×2 IMPLANT
SYR CONTROL 10ML LL (SYRINGE) ×4 IMPLANT
TOWEL GREEN STERILE FF (TOWEL DISPOSABLE) ×3 IMPLANT
TUBE CONNECTING 20X1/4 (TUBING) IMPLANT
YANKAUER SUCT BULB TIP NO VENT (SUCTIONS) IMPLANT

## 2020-03-21 NOTE — Interval H&P Note (Signed)
History and Physical Interval Note:  03/21/2020 1:56 PM  [name]  has presented today for surgery, with the diagnosis of RIGHT BREAST CANCER.  The various methods of treatment have been discussed with the patient and family. After consideration of risks, benefits and other options for treatment, the patient has consented to  Procedure(s): PORT PLACEMENT (N/A) as a surgical intervention.  The patient's history has been reviewed, patient examined, no change in status, stable for surgery.  I have reviewed the patient's chart and labs.  Questions were answered to the patient's satisfaction.     [name]

## 2020-03-21 NOTE — Op Note (Signed)
03/21/2020  3:02 PM  PATIENT:  [name]  65 y.o. female  PRE-OPERATIVE DIAGNOSIS:  RIGHT BREAST CANCER  POST-OPERATIVE DIAGNOSIS:  RIGHT BREAST CANCER  PROCEDURE:  Procedure(s): PORT PLACEMENT WITH ULTRASOUND (Left IJ)  SURGEON:  Surgeon(s) and Role:    * Jovita Kussmaul, MD - Primary  PHYSICIAN ASSISTANT:   ASSISTANTS: none   ANESTHESIA:   local and general  EBL:  10 mL   BLOOD ADMINISTERED:none  DRAINS: none   LOCAL MEDICATIONS USED:  MARCAINE     SPECIMEN:  No Specimen  DISPOSITION OF SPECIMEN:  N/A  COUNTS:  YES  TOURNIQUET:  * No tourniquets in log *  DICTATION: .Dragon Dictation   After informed consent was obtained the patient was brought to the operating room and placed in the supine position on the operating table.  After adequate induction of general anesthesia a roll was placed between the patient's shoulder blades to extend the shoulder slightly.  The left chest and neck area were then prepped with ChloraPrep, allowed to dry, and draped in usual sterile manner.  An appropriate timeout was performed.  The area lateral to the bend of the clavicle was infiltrated with quarter percent Marcaine.  The patient was placed in Trendelenburg position.  A large bore needle from the Port-A-Cath kit was used to slide beneath the bend of the clavicle heading towards the sternal notch.  The needle would not pass underneath the clavicle without feeling like it was hitting the rib.  We were not able to access the subclavian vein this way.  At this point we moved up to the left neck.  The ultrasound was used to visualize the left internal jugular vein and using the large bore needle from the Port-A-Cath kit I was able to access the left internal jugular vein under direct vision with the ultrasound machine.  The wire was then fed through the needle using the Seldinger technique without difficulty.  The wire was confirmed in the central venous system using real-time fluoroscopy.   Next a small incision was made at the wire entry site and then another incision was made on the left chest wall lateral to the bend of the clavicle.  The incision on the chest wall was opened through the skin and subcutaneous tissue sharply with the electrocautery.  A subcutaneous pocket was created inferior to the incision by combination of blunt finger dissection and some sharp dissection with the electrocautery.  Next the 2 incisions were connected by blunt hemostat dissection.  A tendon passer was then placed across the tract from the chest wall incision to the neck.  This device was then used to bring the tubing through the tunnel.  The tubing was then attached to the reservoir and the reservoir was placed in the pocket.  The length of the tubing was estimated using real-time fluoroscopy.  The tubing was cut to the appropriate length.  Next a sheath and dilator were fed over the wire using the Seldinger technique without difficulty.  The dilator and wire were removed from the patient.  The tubing was fed through the sheath as far as it would go and then held in place while the sheath was gently cracked and separated.  Another real-time fluoroscopy image showed the tip of the catheter to be in the distal superior vena cava.  The tubing was then permanently anchored to the reservoir.  The reservoir was anchored in the pocket with two 2-0 Prolene stitches.  The port was then  aspirated and it aspirated blood easily.  The port was then flushed initially with a dilute heparin solution and then with a more concentrated heparin solution.  The skin incision in the neck was closed with an interrupted 4-0 Monocryl subcuticular stitch.  The subcutaneous tissue was closed over the port with interrupted 3-0 Vicryl stitches.  The skin was then closed with a running 4-0 Monocryl subcuticular stitch.  Dermabond dressings were then applied.  The patient tolerated the procedure well.  At the end of the case all needle sponge and  instrument counts were correct.  The patient was then awakened and taken recovery in stable condition.  PLAN OF CARE: Discharge to home after PACU  PATIENT DISPOSITION:  PACU - hemodynamically stable.   Delay start of Pharmacological VTE agent (>24hrs) due to surgical blood loss or risk of bleeding: not applicable

## 2020-03-21 NOTE — Transfer of Care (Signed)
Immediate Anesthesia Transfer of Care Note  Patient: [name]  Procedure(s) Performed: PORT PLACEMENT WITH ULTRASOUND (Left Chest)  Patient Location: PACU  Anesthesia Type:General  Level of Consciousness: awake, alert  and oriented  Airway & Oxygen Therapy: Patient Spontanous Breathing and Patient connected to face mask oxygen  Post-op Assessment: Report given to RN and Post -op Vital signs reviewed and stable  Post vital signs: Reviewed and stable  Last Vitals:  Vitals Value Taken Time  BP    Temp    Pulse    Resp    SpO2      Last Pain:  Vitals:   03/21/20 1311  TempSrc: Oral  PainSc: 0-No pain         Complications: No complications documented.

## 2020-03-21 NOTE — Anesthesia Preprocedure Evaluation (Addendum)
Anesthesia Evaluation  Patient identified by MRN, date of birth, ID band Patient awake    Reviewed: Allergy & Precautions, NPO status , Patient's Chart, lab work & pertinent test results  History of Anesthesia Complications (+) PONV and history of anesthetic complications  Airway Mallampati: II  TM Distance: >3 FB Neck ROM: Full    Dental no notable dental hx. (+) Dental Advisory Given, Teeth Intact   Pulmonary pneumonia, resolved,    Pulmonary exam normal breath sounds clear to auscultation       Cardiovascular negative cardio ROS Normal cardiovascular exam Rhythm:Regular Rate:Normal  Echo 02/2020 1. Left ventricular ejection fraction by 3D volume is 60 %. The left ventricle has normal function. The left ventricle has no regional wall motion abnormalities. Left ventricular diastolic parameters were normal. The average left ventricular global longitudinal strain is -19.7 %. The global longitudinal strain is normal.  2. Right ventricular systolic function is normal. The right ventricular size is normal. Tricuspid regurgitation signal is inadequate for assessing PA pressure.  3. The mitral valve is normal in structure. Trivial mitral valve regurgitation. No evidence of mitral stenosis.  4. The aortic valve is grossly normal. There is mild calcification of the aortic valve. Aortic valve regurgitation is trivial. No aortic stenosis is present.  5. The inferior vena cava is normal in size with greater than 50% respiratory variability, suggesting right atrial pressure of 3 mmHg.    Neuro/Psych negative neurological ROS  negative psych ROS   GI/Hepatic negative GI ROS, Neg liver ROS,   Endo/Other  Hypothyroidism Hyperlipidemia Right Breast Ca Hx/o thyroid Ca  Renal/GU negative Renal ROS  negative genitourinary   Musculoskeletal   Abdominal   Peds  Hematology negative hematology ROS (+)   Anesthesia Other Findings    Reproductive/Obstetrics                            Anesthesia Physical  Anesthesia Plan  ASA: II  Anesthesia Plan: General   Post-op Pain Management:  Regional for Post-op pain   Induction: Intravenous  PONV Risk Score and Plan: 4 or greater and Scopolamine patch - Pre-op, Treatment may vary due to age or medical condition, Ondansetron, Dexamethasone and Midazolam  Airway Management Planned: LMA  Additional Equipment:   Intra-op Plan:   Post-operative Plan: Extubation in OR  Informed Consent: I have reviewed the patients History and Physical, chart, labs and discussed the procedure including the risks, benefits and alternatives for the proposed anesthesia with the patient or authorized representative who has indicated his/her understanding and acceptance.     Dental advisory given  Plan Discussed with: CRNA  Anesthesia Plan Comments:        Anesthesia Quick Evaluation

## 2020-03-21 NOTE — Discharge Instructions (Signed)
NO TYLENOL OR IBUPROFEN UNTIL 7:30PM IF NEEDED   Post Anesthesia Home Care Instructions  Activity: Get plenty of rest for the remainder of the day. A responsible individual must stay with you for 24 hours following the procedure.  For the next 24 hours, DO NOT: -Drive a car -Paediatric nurse -Drink alcoholic beverages -Take any medication unless instructed by your physician -Make any legal decisions or sign important papers.  Meals: Start with liquid foods such as gelatin or soup. Progress to regular foods as tolerated. Avoid greasy, spicy, heavy foods. If nausea and/or vomiting occur, drink only clear liquids until the nausea and/or vomiting subsides. Call your physician if vomiting continues.  Special Instructions/Symptoms: Your throat may feel dry or sore from the anesthesia or the breathing tube placed in your throat during surgery. If this causes discomfort, gargle with warm salt water. The discomfort should disappear within 24 hours.  If you had a scopolamine patch placed behind your ear for the management of post- operative nausea and/or vomiting:  1. The medication in the patch is effective for 72 hours, after which it should be removed.  Wrap patch in a tissue and discard in the trash. Wash hands thoroughly with soap and water. 2. You may remove the patch earlier than 72 hours if you experience unpleasant side effects which may include dry mouth, dizziness or visual disturbances. 3. Avoid touching the patch. Wash your hands with soap and water after contact with the patch.

## 2020-03-21 NOTE — H&P (Signed)
[name]  Location: [facility] Surgery Patient #: [id number] DOB: 03/16/1955 Married / Language: English / Race: White Female   History of Present Illness  The patient is a 65 year old female who presents for a follow-up for Breast cancer. The patient is a 65 year old white female who is about 2 weeks status post right modified radical mastectomy for a T3 N2a right breast cancer that was ER/PR positive and HER-2/neu negative with a Ki-67 of 10%. She also had a left prophylactic mastectomy. She tolerated the surgery well. Her drains are putting out minimal amounts of fluid. She is scheduled for a Port-A-Cath next week so she can begin chemotherapy.    Review of Systems  General Not Present- Appetite Loss, Chills, Fatigue, Fever, Night Sweats, Weight Gain and Weight Loss. Skin Not Present- Change in Wart/Mole, Dryness, Hives, Jaundice, New Lesions, Non-Healing Wounds, Rash and Ulcer. HEENT Present- Wears glasses/contact lenses. Not Present- Earache, Hearing Loss, Hoarseness, Nose Bleed, Oral Ulcers, Ringing in the Ears, Seasonal Allergies, Sinus Pain, Sore Throat, Visual Disturbances and Yellow Eyes. Respiratory Not Present- Bloody sputum, Chronic Cough, Difficulty Breathing, Snoring and Wheezing. Breast Present- Breast Mass. Not Present- Breast Pain, Nipple Discharge and Skin Changes. Cardiovascular Not Present- Chest Pain, Difficulty Breathing Lying Down, Leg Cramps, Palpitations, Rapid Heart Rate, Shortness of Breath and Swelling of Extremities. Gastrointestinal Not Present- Abdominal Pain, Bloating, Bloody Stool, Change in Bowel Habits, Chronic diarrhea, Constipation, Difficulty Swallowing, Excessive gas, Gets full quickly at meals, Hemorrhoids, Indigestion, Nausea, Rectal Pain and Vomiting. Female Genitourinary Not Present- Frequency, Nocturia, Painful Urination, Pelvic Pain and Urgency. Musculoskeletal Not Present- Back Pain, Joint Pain, Joint Stiffness, Muscle Pain, Muscle  Weakness and Swelling of Extremities. Neurological Not Present- Decreased Memory, Fainting, Headaches, Numbness, Seizures, Tingling, Tremor, Trouble walking and Weakness. Psychiatric Not Present- Anxiety, Bipolar, Change in Sleep Pattern, Depression, Fearful and Frequent crying. Endocrine Not Present- Cold Intolerance, Excessive Hunger, Hair Changes, Heat Intolerance, Hot flashes and New Diabetes. Hematology Not Present- Blood Thinners, Easy Bruising, Excessive bleeding, Gland problems, HIV and Persistent Infections.   Physical Exam  General Mental Status-Alert. General Appearance-Consistent with stated age. Hydration-Well hydrated. Voice-Normal.  Head and Neck Head-normocephalic, atraumatic with no lesions or palpable masses. Trachea-midline. Thyroid Gland Characteristics - normal size and consistency.  Eye Eyeball - Bilateral-Extraocular movements intact. Sclera/Conjunctiva - Bilateral-No scleral icterus.  Chest and Lung Exam Chest and lung exam reveals -quiet, even and easy respiratory effort with no use of accessory muscles and on auscultation, normal breath sounds, no adventitious sounds and normal vocal resonance. Inspection Chest Wall - Normal. Back - normal.  Breast Note: Both mastectomy incisions are healing nicely with no sign of infection or seroma. The skin flaps are healthy. The drains were removed today without difficulty and she tolerated it well.   Cardiovascular Cardiovascular examination reveals -normal heart sounds, regular rate and rhythm with no murmurs and normal pedal pulses bilaterally.  Abdomen Inspection Inspection of the abdomen reveals - No Hernias. Skin - Scar - no surgical scars. Palpation/Percussion Palpation and Percussion of the abdomen reveal - Soft, Non Tender, No Rebound tenderness, No Rigidity (guarding) and No hepatosplenomegaly. Auscultation Auscultation of the abdomen reveals - Bowel sounds  normal.  Neurologic Neurologic evaluation reveals -alert and oriented x 3 with no impairment of recent or remote memory. Mental Status-Normal.  Musculoskeletal Normal Exam - Left-Upper Extremity Strength Normal and Lower Extremity Strength Normal. Normal Exam - Right-Upper Extremity Strength Normal and Lower Extremity Strength Normal.  Lymphatic Head & Neck  General Head &  Neck Lymphatics: Bilateral - Description - Normal. Axillary  General Axillary Region: Bilateral - Description - Normal. Tenderness - Non Tender. Femoral & Inguinal  Generalized Femoral & Inguinal Lymphatics: Bilateral - Description - Normal. Tenderness - Non Tender.    Assessment & Plan  MALIGNANT NEOPLASM OF LOWER-OUTER QUADRANT OF RIGHT BREAST OF FEMALE, ESTROGEN RECEPTOR POSITIVE (C50.511) Impression: The patient is about 2 weeks status post right mastectomy for a locally advanced breast cancer as well as a left prophylactic mastectomy. She tolerated the surgery well. Her drains were removed today without difficulty. She is scheduled for a port so she can receive chemotherapy next week. I have discussed with her and detailed the risks and benefits of the operation as well as some of the technical aspects and she understands and wishes to proceed Current Plans Referred to Physical Therapy, for evaluation and follow up (Physical Therapy). Routine.

## 2020-03-22 NOTE — Anesthesia Postprocedure Evaluation (Signed)
Anesthesia Post Note  Patient: [name]  Procedure(s) Performed: PORT PLACEMENT WITH ULTRASOUND (Left Chest)     Patient location during evaluation: PACU Anesthesia Type: General Level of consciousness: sedated and patient cooperative Pain management: pain level controlled Vital Signs Assessment: post-procedure vital signs reviewed and stable Respiratory status: spontaneous breathing Cardiovascular status: stable Anesthetic complications: no   No complications documented.  Last Vitals:  Vitals:   03/21/20 1543 03/21/20 1545  BP: 134/79 134/79  Pulse: 65 64  Resp: 17 20  Temp:  36.7 C  SpO2: 100% 99%    Last Pain:  Vitals:   03/21/20 1545  TempSrc:   PainSc: 0-No pain                 Nolon Nations

## 2020-03-24 ENCOUNTER — Encounter (HOSPITAL_BASED_OUTPATIENT_CLINIC_OR_DEPARTMENT_OTHER): Payer: Self-pay | Admitting: General Surgery

## 2020-03-25 NOTE — Progress Notes (Signed)
HEMATOLOGY-ONCOLOGY MYCHART VIDEO VISIT PROGRESS NOTE  I connected with [name] on 03/26/2020 at  1:45 PM EST by MyChart video conference and verified that I am speaking with the correct person using two identifiers.  I discussed the limitations, risks, security and privacy concerns of performing an evaluation and management service by MyChart and the availability of in person appointments.  I also discussed with the patient that there may be a patient responsible charge related to this service. The patient expressed understanding and agreed to proceed.  Patient's Location: Home Physician Location: Clinic  CHIEF COMPLIANT: Cycle 1 Adriamycin and Cytoxan  INTERVAL HISTORY: [name] is a 65 y.o. female with above-mentioned history of right breast cancercurrently on neoadjuvant antiestrogen therapy with anastrozole who underwent bilateral mastectomies. She is currently on adjuvant chemotherapy with dose dense Adriamycin and Cytoxan. Her port was placed by Dr. [name] on 03/21/20. She presents to the clinic todayfor treatment.  Oncology History  Malignant neoplasm of lower-outer quadrant of right breast of female, estrogen receptor positive ([place])  08/01/2019 Initial Diagnosis   Palpable right breast mass for 1 month.  Mammogram revealed a 5 cm mass 8:30 position, 3 cm intramammary lymph node at 10 o'clock position and 2 enlarged right axillary lymph nodes.  Biopsy revealed grade 1 IDC ER 90%, PR 10%, Ki-67 10%, HER-2 negative.  Intramammary node and axillary lymph node both positive with similar prognostic profile   08/08/2019 -  Neo-Adjuvant Anti-estrogen oral therapy   Anastrozole while deciding treatment plan    02/28/2020 Surgery   Bilateral mastectomies [name]):  Right breast: IDC, 6.5cm, metastatic carcinoma in 6/7 right axillary lymph nodes, 1.5cm, 1.5cm, and 0.3cm, and one intramammary lymph node, 2.5cm Left breast: no evidence of malignancy in the breast or 1 left axillary  lymph node.   03/26/2020 -  Chemotherapy    Patient is on Treatment Plan: BREAST ADJUVANT DOSE DENSE AC Q14D / PACLITAXEL Q7D        Observations/Objective:  Today's Vitals   03/26/20 1336  BP: 120/66  Pulse: 85  Resp: 18  Temp: 98.1 F (36.7 C)  SpO2: 98%  Weight: 148 lb 14.4 oz (67.5 kg)  Height: '5\' 7"'  (1.702 m)   Body mass index is 23.32 kg/m.  I have reviewed the data as listed CMP Latest Ref Rng & Units 02/25/2020 08/23/2019 06/12/2007  Glucose 70 - 99 mg/dL 111(H) 122(H) 111(H)  BUN 8 - 23 mg/dL '14 13 10  ' Creatinine 0.44 - 1.00 mg/dL 0.70 0.83 0.72  Sodium 135 - 145 mmol/L 140 143 137  Potassium 3.5 - 5.1 mmol/L 3.8 4.4 3.7  Chloride 98 - 111 mmol/L 103 105 105  CO2 22 - 32 mmol/L '28 27 26  ' Calcium 8.9 - 10.3 mg/dL 9.5 9.9 8.9  Total Protein 6.5 - 8.1 g/dL - 7.9 -  Total Bilirubin 0.3 - 1.2 mg/dL - 0.5 -  Alkaline Phos 38 - 126 U/L - 66 -  AST 15 - 41 U/L - 16 -  ALT 0 - 44 U/L - 16 -    Lab Results  Component Value Date   WBC 10.7 (H) 03/26/2020   HGB 12.9 03/26/2020   HCT 37.0 03/26/2020   MCV 87.5 03/26/2020   PLT 246 03/26/2020   NEUTROABS 6.8 03/26/2020      Assessment Plan:  Malignant neoplasm of lower-outer quadrant of right breast of female, estrogen receptor positive ([place]) 08/01/2019:Palpable right breast mass for 1 month. Mammogram revealed a 5 cm mass 8:30  position, 3 cm intramammary lymph node at 10 o'clock position and 2 enlarged right axillary lymph nodes. Biopsy revealed grade 1 IDC ER 90%, PR 10%, Ki-67 10%, HER-2 negative. Intramammary node and axillary lymph node both positive with similar prognostic profile  Breast MRI 08/15/2019: Biopsy-proven malignancy lower outer quadrant right breast, non-mass enhancement spanning 8.7 cm with probable invasion of underlying pectoral muscle. Biopsy-proven metastatic lymph node in the right axillary tail. Mild asymmetric right infraclavicular adenopathy.Biopsy positive for invasive ductal carcinoma  grade 1  CT CAP 08/24/2019: Right axillary and right retropectoral adenopathy compatible with nodal metastases. Indistinct sclerotic 1.2 cm left acetabular lesion. No other distant metastases MammaPrint: Luminal type A, low risk, no benefit to chemotherapy Bone scan: Negative for metastatic disease  Treatment Summary: 1.Neoadjuvant antiestrogen therapy with anastrozole 1 mg daily started 08/08/2019 2.bilateral mastectomies 02/28/2020: Right mastectomy with ALND: IDC 6.5 cm, 7/8 lymph nodes positive; left mastectomy: Benign ER 90%, PR 10%, Ki-67 10%, HER2 negative 3. given the fact that the patient had 7 positive lymph nodes, the standard of care would be to offer adjuvant chemotherapy.  I recommended dose dense Adriamycin and Cytoxan followed by Taxol. 4.Adjradiation 5.  Followed by adjuvant antiestrogen therapy with the CDK 4 and 6 inhibitor ----------------------------------------------------------------------------------------------------------------------------- Current Treatment: Cycle 1 Dose dense Adriamycin and Cytoxan Labs reviewed Chemo consent obtained and chemo education completed  RTC in 1 week for Tox check     I discussed the assessment and treatment plan with the patient. The patient was provided an opportunity to ask questions and all were answered. The patient agreed with the plan and demonstrated an understanding of the instructions. The patient was advised to call back or seek an in-person evaluation if the symptoms worsen or if the condition fails to improve as anticipated.   Total time spent: 30 minutes including face-to-face MyChart video visit time and time spent for planning, charting and coordination of care  Rulon Eisenmenger, MD 03/26/2020   I, Cloyde Reams Dorshimer, am acting as scribe for [name], MD.  I have reviewed the above documentation for accuracy and completeness, and I agree with the above.

## 2020-03-26 ENCOUNTER — Encounter: Payer: Self-pay | Admitting: *Deleted

## 2020-03-26 ENCOUNTER — Encounter: Payer: Self-pay | Admitting: Radiology

## 2020-03-26 ENCOUNTER — Inpatient Hospital Stay: Payer: Commercial Managed Care - PPO | Attending: Hematology and Oncology

## 2020-03-26 ENCOUNTER — Inpatient Hospital Stay (HOSPITAL_BASED_OUTPATIENT_CLINIC_OR_DEPARTMENT_OTHER): Payer: Commercial Managed Care - PPO | Admitting: Hematology and Oncology

## 2020-03-26 ENCOUNTER — Inpatient Hospital Stay: Payer: Commercial Managed Care - PPO

## 2020-03-26 ENCOUNTER — Other Ambulatory Visit: Payer: Self-pay

## 2020-03-26 VITALS — Ht 67.0 in | Wt 149.0 lb

## 2020-03-26 DIAGNOSIS — C50511 Malignant neoplasm of lower-outer quadrant of right female breast: Secondary | ICD-10-CM

## 2020-03-26 DIAGNOSIS — Z5189 Encounter for other specified aftercare: Secondary | ICD-10-CM | POA: Insufficient documentation

## 2020-03-26 DIAGNOSIS — Z170 Estrogen receptor positive status [ER+]: Secondary | ICD-10-CM

## 2020-03-26 DIAGNOSIS — Z5111 Encounter for antineoplastic chemotherapy: Secondary | ICD-10-CM | POA: Insufficient documentation

## 2020-03-26 DIAGNOSIS — C50411 Malignant neoplasm of upper-outer quadrant of right female breast: Secondary | ICD-10-CM | POA: Insufficient documentation

## 2020-03-26 DIAGNOSIS — Z006 Encounter for examination for normal comparison and control in clinical research program: Secondary | ICD-10-CM

## 2020-03-26 DIAGNOSIS — Z95828 Presence of other vascular implants and grafts: Secondary | ICD-10-CM

## 2020-03-26 LAB — CMP (CANCER CENTER ONLY)
ALT: 15 U/L (ref 0–44)
AST: 14 U/L — ABNORMAL LOW (ref 15–41)
Albumin: 4.2 g/dL (ref 3.5–5.0)
Alkaline Phosphatase: 72 U/L (ref 38–126)
Anion gap: 9 (ref 5–15)
BUN: 11 mg/dL (ref 8–23)
CO2: 25 mmol/L (ref 22–32)
Calcium: 9 mg/dL (ref 8.9–10.3)
Chloride: 106 mmol/L (ref 98–111)
Creatinine: 0.75 mg/dL (ref 0.44–1.00)
GFR, Estimated: >60 mL/min (ref >60)
Glucose, Bld: 137 mg/dL — ABNORMAL HIGH (ref 70–99)
Potassium: 3.6 mmol/L (ref 3.5–5.1)
Sodium: 140 mmol/L (ref 135–145)
Total Bilirubin: 0.5 mg/dL (ref 0.3–1.2)
Total Protein: 7.4 g/dL (ref 6.5–8.1)

## 2020-03-26 LAB — CBC WITH DIFFERENTIAL (CANCER CENTER ONLY)
Abs Immature Granulocytes: 0.04 10*3/uL (ref 0.00–0.07)
Basophils Absolute: 0.1 10*3/uL (ref 0.0–0.1)
Basophils Relative: 1 %
Eosinophils Absolute: 0.2 10*3/uL (ref 0.0–0.5)
Eosinophils Relative: 2 %
HCT: 37 % (ref 36.0–46.0)
Hemoglobin: 12.9 g/dL (ref 12.0–15.0)
Immature Granulocytes: 0 %
Lymphocytes Relative: 26 %
Lymphs Abs: 2.7 10*3/uL (ref 0.7–4.0)
MCH: 30.5 pg (ref 26.0–34.0)
MCHC: 34.9 g/dL (ref 30.0–36.0)
MCV: 87.5 fL (ref 80.0–100.0)
Monocytes Absolute: 0.8 10*3/uL (ref 0.1–1.0)
Monocytes Relative: 7 %
Neutro Abs: 6.8 10*3/uL (ref 1.7–7.7)
Neutrophils Relative %: 64 %
Platelet Count: 246 10*3/uL (ref 150–400)
RBC: 4.23 MIL/uL (ref 3.87–5.11)
RDW: 11.9 % (ref 11.5–15.5)
WBC Count: 10.7 10*3/uL — ABNORMAL HIGH (ref 4.0–10.5)
nRBC: 0 % (ref 0.0–0.2)

## 2020-03-26 LAB — RESEARCH LABS

## 2020-03-26 MED ORDER — PALONOSETRON HCL INJECTION 0.25 MG/5ML
INTRAVENOUS | Status: AC
  Filled 2020-03-26: qty 5

## 2020-03-26 MED ORDER — SODIUM CHLORIDE 0.9% FLUSH
10.0000 mL | INTRAVENOUS | Status: DC | PRN
  Administered 2020-03-26: 10 mL via INTRAVENOUS
  Filled 2020-03-26: qty 10

## 2020-03-26 MED ORDER — SODIUM CHLORIDE 0.9 % IV SOLN
Freq: Once | INTRAVENOUS | Status: AC
  Filled 2020-03-26: qty 250

## 2020-03-26 MED ORDER — DOXORUBICIN HCL CHEMO IV INJECTION 2 MG/ML
60.0000 mg/m2 | Freq: Once | INTRAVENOUS | Status: AC
  Administered 2020-03-26: 106 mg via INTRAVENOUS
  Filled 2020-03-26: qty 53

## 2020-03-26 MED ORDER — SODIUM CHLORIDE 0.9 % IV SOLN
10.0000 mg | Freq: Once | INTRAVENOUS | Status: AC
  Administered 2020-03-26: 10 mg via INTRAVENOUS
  Filled 2020-03-26: qty 10

## 2020-03-26 MED ORDER — SODIUM CHLORIDE 0.9% FLUSH
10.0000 mL | INTRAVENOUS | Status: DC | PRN
  Administered 2020-03-26: 10 mL
  Filled 2020-03-26: qty 10

## 2020-03-26 MED ORDER — PALONOSETRON HCL INJECTION 0.25 MG/5ML
0.2500 mg | Freq: Once | INTRAVENOUS | Status: AC
  Administered 2020-03-26: 0.25 mg via INTRAVENOUS

## 2020-03-26 MED ORDER — SODIUM CHLORIDE 0.9 % IV SOLN
150.0000 mg | Freq: Once | INTRAVENOUS | Status: AC
  Administered 2020-03-26: 150 mg via INTRAVENOUS
  Filled 2020-03-26: qty 150

## 2020-03-26 MED ORDER — HEPARIN SOD (PORK) LOCK FLUSH 100 UNIT/ML IV SOLN
500.0000 [IU] | Freq: Once | INTRAVENOUS | Status: AC | PRN
  Administered 2020-03-26: 500 [IU]
  Filled 2020-03-26: qty 5

## 2020-03-26 MED ORDER — SODIUM CHLORIDE 0.9 % IV SOLN
600.0000 mg/m2 | Freq: Once | INTRAVENOUS | Status: AC
  Administered 2020-03-26: 1060 mg via INTRAVENOUS
  Filled 2020-03-26: qty 53

## 2020-03-26 NOTE — Assessment & Plan Note (Signed)
08/01/2019:Palpable right breast mass for 1 month. Mammogram revealed a 5 cm mass 8:30 position, 3 cm intramammary lymph node at 10 o'clock position and 2 enlarged right axillary lymph nodes. Biopsy revealed grade 1 IDC ER 90%, PR 10%, Ki-67 10%, HER-2 negative. Intramammary node and axillary lymph node both positive with similar prognostic profile  Breast MRI 08/15/2019: Biopsy-proven malignancy lower outer quadrant right breast, non-mass enhancement spanning 8.7 cm with probable invasion of underlying pectoral muscle. Biopsy-proven metastatic lymph node in the right axillary tail. Mild asymmetric right infraclavicular adenopathy.Biopsy positive for invasive ductal carcinoma grade 1  CT CAP 08/24/2019: Right axillary and right retropectoral adenopathy compatible with nodal metastases. Indistinct sclerotic 1.2 cm left acetabular lesion. No other distant metastases MammaPrint: Luminal type A, low risk, no benefit to chemotherapy Bone scan: Negative for metastatic disease  Treatment Summary: 1.Neoadjuvant antiestrogen therapy with anastrozole 1 mg daily started 08/08/2019 2.bilateral mastectomies 02/28/2020: Right mastectomy with ALND: IDC 6.5 cm, 7/8 lymph nodes positive; left mastectomy: Benign ER 90%, PR 10%, Ki-67 10%, HER2 negative 3. given the fact that the patient had 7 positive lymph nodes, the standard of care would be to offer adjuvant chemotherapy.  I recommended dose dense Adriamycin and Cytoxan followed by Taxol. 4.Adjradiation 5.  Followed by adjuvant antiestrogen therapy with the CDK 4 and 6 inhibitor ----------------------------------------------------------------------------------------------------------------------------- Current Treatment: Cycle 1 Dose dense Adriamycin and Cytoxan Labs reviewed Chemo consent obtained and chemo education completed  RTC in 1 week for Tox check

## 2020-03-26 NOTE — Research (Signed)
[place] [address]: TREATMENT OF REFRACTORY NAUSEA  03/26/2020      12:50 PM  BASELINE: Met with [name] who was accompanied by husband Elta Guadeloupe to collect baseline questionnaire packet. All questions were complete and information was put into [employer]. Patient was given a copy of Cycle 1 questionnaires along with pre-paid envelope in the case that she is not able to complete them via e-mail. Labs were collected via port as planned per protocol with no deviations. Plan is to call patient on Monday in order to check on nausea status. Patient has an appt with Dr. Lindi Adie next week. Thanked patient for her time and participation in the above mentioned study.   Carol Ada, RT(R)(T) Clinical Research Coordinator

## 2020-03-26 NOTE — Patient Instructions (Signed)
Implanted Port Insertion, Care After This sheet gives you information about how to care for yourself after your procedure. Your health care provider may also give you more specific instructions. If you have problems or questions, contact your health care provider. What can I expect after the procedure? After the procedure, it is common to have:  Discomfort at the port insertion site.  Bruising on the skin over the port. This should improve over 3-4 days. Follow these instructions at home: Port care  After your port is placed, you will get a manufacturer's information card. The card has information about your port. Keep this card with you at all times.  Take care of the port as told by your health care provider. Ask your health care provider if you or a family member can get training for taking care of the port at home. A home health care nurse may also take care of the port.  Make sure to remember what type of port you have. Incision care  Follow instructions from your health care provider about how to take care of your port insertion site. Make sure you: ? Wash your hands with soap and water before and after you change your bandage (dressing). If soap and water are not available, use hand sanitizer. ? Change your dressing as told by your health care provider. ? Leave stitches (sutures), skin glue, or adhesive strips in place. These skin closures may need to stay in place for 2 weeks or longer. If adhesive strip edges start to loosen and curl up, you may trim the loose edges. Do not remove adhesive strips completely unless your health care provider tells you to do that.  Check your port insertion site every day for signs of infection. Check for: ? Redness, swelling, or pain. ? Fluid or blood. ? Warmth. ? Pus or a bad smell.      Activity  Return to your normal activities as told by your health care provider. Ask your health care provider what activities are safe for you.  Do not  lift anything that is heavier than 10 lb (4.5 kg), or the limit that you are told, until your health care provider says that it is safe. General instructions  Take over-the-counter and prescription medicines only as told by your health care provider.  Do not take baths, swim, or use a hot tub until your health care provider approves. Ask your health care provider if you may take showers. You may only be allowed to take sponge baths.  Do not drive for 24 hours if you were given a sedative during your procedure.  Wear a medical alert bracelet in case of an emergency. This will tell any health care providers that you have a port.  Keep all follow-up visits as told by your health care provider. This is important. Contact a health care provider if:  You cannot flush your port with saline as directed, or you cannot draw blood from the port.  You have a fever or chills.  You have redness, swelling, or pain around your port insertion site.  You have fluid or blood coming from your port insertion site.  Your port insertion site feels warm to the touch.  You have pus or a bad smell coming from the port insertion site. Get help right away if:  You have chest pain or shortness of breath.  You have bleeding from your port that you cannot control. Summary  Take care of the port as told by your   health care provider. Keep the manufacturer's information card with you at all times.  Change your dressing as told by your health care provider.  Contact a health care provider if you have a fever or chills or if you have redness, swelling, or pain around your port insertion site.  Keep all follow-up visits as told by your health care provider. This information is not intended to replace advice given to you by your health care provider. Make sure you discuss any questions you have with your health care provider. Document Revised: 08/30/2017 Document Reviewed: 08/30/2017 Elsevier Patient Education   2021 [employer]  

## 2020-03-26 NOTE — Patient Instructions (Signed)
Gross Discharge Instructions for Patients Receiving Chemotherapy  Today you received the following chemotherapy agents: Doxorubicin and Cyclophosphamide (Cytoxan)  To help prevent nausea and vomiting after your treatment, we encourage you to take your nausea medication  as prescribed.    If you develop nausea and vomiting that is not controlled by your nausea medication, call the clinic.   BELOW ARE SYMPTOMS THAT SHOULD BE REPORTED IMMEDIATELY:  *FEVER GREATER THAN 100.5 F  *CHILLS WITH OR WITHOUT FEVER  NAUSEA AND VOMITING THAT IS NOT CONTROLLED WITH YOUR NAUSEA MEDICATION  *UNUSUAL SHORTNESS OF BREATH  *UNUSUAL BRUISING OR BLEEDING  TENDERNESS IN MOUTH AND THROAT WITH OR WITHOUT PRESENCE OF ULCERS  *URINARY PROBLEMS  *BOWEL PROBLEMS  UNUSUAL RASH Items with * indicate a potential emergency and should be followed up as soon as possible.  Feel free to call the clinic should you have any questions or concerns. The clinic phone number is [phone].  Please show the [place] at check-in to the Emergency Department and triage nurse.  Doxorubicin injection What is this medicine? DOXORUBICIN (dox oh ROO bi sin) is a chemotherapy drug. It is used to treat many kinds of cancer like leukemia, lymphoma, neuroblastoma, sarcoma, and Wilms' tumor. It is also used to treat bladder cancer, breast cancer, lung cancer, ovarian cancer, stomach cancer, and thyroid cancer. This medicine may be used for other purposes; ask your health care provider or pharmacist if you have questions. COMMON BRAND NAME(S): Adriamycin, Adriamycin PFS, Adriamycin RDF, Rubex What should I tell my health care provider before I take this medicine? They need to know if you have any of these conditions:  heart disease  history of low blood counts caused by a medicine  liver disease  recent or ongoing radiation therapy  an unusual or allergic reaction to doxorubicin, other  chemotherapy agents, other medicines, foods, dyes, or preservatives  pregnant or trying to get pregnant  breast-feeding How should I use this medicine? This drug is given as an infusion into a vein. It is administered in a hospital or clinic by a specially trained health care professional. If you have pain, swelling, burning or any unusual feeling around the site of your injection, tell your health care professional right away. Talk to your pediatrician regarding the use of this medicine in children. Special care may be needed. Overdosage: If you think you have taken too much of this medicine contact a poison control center or emergency room at once. NOTE: This medicine is only for you. Do not share this medicine with others. What if I miss a dose? It is important not to miss your dose. Call your doctor or health care professional if you are unable to keep an appointment. What may interact with this medicine? This medicine may interact with the following medications:  6-mercaptopurine  paclitaxel  phenytoin  St. John's Wort  trastuzumab  verapamil This list may not describe all possible interactions. Give your health care provider a list of all the medicines, herbs, non-prescription drugs, or dietary supplements you use. Also tell them if you smoke, drink alcohol, or use illegal drugs. Some items may interact with your medicine. What should I watch for while using this medicine? This drug may make you feel generally unwell. This is not uncommon, as chemotherapy can affect healthy cells as well as cancer cells. Report any side effects. Continue your course of treatment even though you feel ill unless your doctor tells you to stop. There is  a maximum amount of this medicine you should receive throughout your life. The amount depends on the medical condition being treated and your overall health. Your doctor will watch how much of this medicine you receive in your lifetime. Tell your doctor  if you have taken this medicine before. You may need blood work done while you are taking this medicine. Your urine may turn red for a few days after your dose. This is not blood. If your urine is dark or brown, call your doctor. In some cases, you may be given additional medicines to help with side effects. Follow all directions for their use. Call your doctor or health care professional for advice if you get a fever, chills or sore throat, or other symptoms of a cold or flu. Do not treat yourself. This drug decreases your body's ability to fight infections. Try to avoid being around people who are sick. This medicine may increase your risk to bruise or bleed. Call your doctor or health care professional if you notice any unusual bleeding. Talk to your doctor about your risk of cancer. You may be more at risk for certain types of cancers if you take this medicine. Do not become pregnant while taking this medicine or for 6 months after stopping it. Women should inform their doctor if they wish to become pregnant or think they might be pregnant. Men should not father a child while taking this medicine and for 6 months after stopping it. There is a potential for serious side effects to an unborn child. Talk to your health care professional or pharmacist for more information. Do not breast-feed an infant while taking this medicine. This medicine has caused ovarian failure in some women and reduced sperm counts in some men This medicine may interfere with the ability to have a child. Talk with your doctor or health care professional if you are concerned about your fertility. This medicine may cause a decrease in Co-Enzyme Q-10. You should make sure that you get enough Co-Enzyme Q-10 while you are taking this medicine. Discuss the foods you eat and the vitamins you take with your health care professional. What side effects may I notice from receiving this medicine? Side effects that you should report to your  doctor or health care professional as soon as possible:  allergic reactions like skin rash, itching or hives, swelling of the face, lips, or tongue  breathing problems  chest pain  fast or irregular heartbeat  low blood counts - this medicine may decrease the number of white blood cells, red blood cells and platelets. You may be at increased risk for infections and bleeding.  pain, redness, or irritation at site where injected  signs of infection - fever or chills, cough, sore throat, pain or difficulty passing urine  signs of decreased platelets or bleeding - bruising, pinpoint red spots on the skin, black, tarry stools, blood in the urine  swelling of the ankles, feet, hands  tiredness  weakness Side effects that usually do not require medical attention (report to your doctor or health care professional if they continue or are bothersome):  diarrhea  hair loss  mouth sores  nail discoloration or damage  nausea  red colored urine  vomiting This list may not describe all possible side effects. Call your doctor for medical advice about side effects. You may report side effects to FDA at 1-800-FDA-1088. Where should I keep my medicine? This drug is given in a hospital or clinic and will not be  stored at home. NOTE: This sheet is a summary. It may not cover all possible information. If you have questions about this medicine, talk to your doctor, pharmacist, or health care provider.  2021 Elsevier/Gold Standard (2016-09-15 11:01:26)   Cyclophosphamide Injection What is this medicine? CYCLOPHOSPHAMIDE (sye kloe FOSS fa mide) is a chemotherapy drug. It slows the growth of cancer cells. This medicine is used to treat many types of cancer like lymphoma, myeloma, leukemia, breast cancer, and ovarian cancer, to name a few. This medicine may be used for other purposes; ask your health care provider or pharmacist if you have questions. COMMON BRAND NAME(S): Cytoxan, Neosar What  should I tell my health care provider before I take this medicine? They need to know if you have any of these conditions:  heart disease  history of irregular heartbeat  infection  kidney disease  liver disease  low blood counts, like white cells, platelets, or red blood cells  on hemodialysis  recent or ongoing radiation therapy  scarring or thickening of the lungs  trouble passing urine  an unusual or allergic reaction to cyclophosphamide, other medicines, foods, dyes, or preservatives  pregnant or trying to get pregnant  breast-feeding How should I use this medicine? This drug is usually given as an injection into a vein or muscle or by infusion into a vein. It is administered in a hospital or clinic by a specially trained health care professional. Talk to your pediatrician regarding the use of this medicine in children. Special care may be needed. Overdosage: If you think you have taken too much of this medicine contact a poison control center or emergency room at once. NOTE: This medicine is only for you. Do not share this medicine with others. What if I miss a dose? It is important not to miss your dose. Call your doctor or health care professional if you are unable to keep an appointment. What may interact with this medicine?  amphotericin B  azathioprine  certain antivirals for HIV or hepatitis  certain medicines for blood pressure, heart disease, irregular heart beat  certain medicines that treat or prevent blood clots like warfarin  certain other medicines for cancer  cyclosporine  etanercept  indomethacin  medicines that relax muscles for surgery  medicines to increase blood counts  metronidazole This list may not describe all possible interactions. Give your health care provider a list of all the medicines, herbs, non-prescription drugs, or dietary supplements you use. Also tell them if you smoke, drink alcohol, or use illegal drugs. Some items  may interact with your medicine. What should I watch for while using this medicine? Your condition will be monitored carefully while you are receiving this medicine. You may need blood work done while you are taking this medicine. Drink water or other fluids as directed. Urinate often, even at night. Some products may contain alcohol. Ask your health care professional if this medicine contains alcohol. Be sure to tell all health care professionals you are taking this medicine. Certain medicines, like metronidazole and disulfiram, can cause an unpleasant reaction when taken with alcohol. The reaction includes flushing, headache, nausea, vomiting, sweating, and increased thirst. The reaction can last from 30 minutes to several hours. Do not become pregnant while taking this medicine or for 1 year after stopping it. Women should inform their health care professional if they wish to become pregnant or think they might be pregnant. Men should not father a child while taking this medicine and for 4 months after  stopping it. There is potential for serious side effects to an unborn child. Talk to your health care professional for more information. Do not breast-feed an infant while taking this medicine or for 1 week after stopping it. This medicine has caused ovarian failure in some women. This medicine may make it more difficult to get pregnant. Talk to your health care professional if you are concerned about your fertility. This medicine has caused decreased sperm counts in some men. This may make it more difficult to father a child. Talk to your health care professional if you are concerned about your fertility. Call your health care professional for advice if you get a fever, chills, or sore throat, or other symptoms of a cold or flu. Do not treat yourself. This medicine decreases your body's ability to fight infections. Try to avoid being around people who are sick. Avoid taking medicines that contain  aspirin, acetaminophen, ibuprofen, naproxen, or ketoprofen unless instructed by your health care professional. These medicines may hide a fever. Talk to your health care professional about your risk of cancer. You may be more at risk for certain types of cancer if you take this medicine. If you are going to need surgery or other procedure, tell your health care professional that you are using this medicine. Be careful brushing or flossing your teeth or using a toothpick because you may get an infection or bleed more easily. If you have any dental work done, tell your dentist you are receiving this medicine. What side effects may I notice from receiving this medicine? Side effects that you should report to your doctor or health care professional as soon as possible:  allergic reactions like skin rash, itching or hives, swelling of the face, lips, or tongue  breathing problems  nausea, vomiting  signs and symptoms of bleeding such as bloody or black, tarry stools; red or dark brown urine; spitting up blood or brown material that looks like coffee grounds; red spots on the skin; unusual bruising or bleeding from the eyes, gums, or nose  signs and symptoms of heart failure like fast, irregular heartbeat, sudden weight gain; swelling of the ankles, feet, hands  signs and symptoms of infection like fever; chills; cough; sore throat; pain or trouble passing urine  signs and symptoms of kidney injury like trouble passing urine or change in the amount of urine  signs and symptoms of liver injury like dark yellow or brown urine; general ill feeling or flu-like symptoms; light-colored stools; loss of appetite; nausea; right upper belly pain; unusually weak or tired; yellowing of the eyes or skin Side effects that usually do not require medical attention (report to your doctor or health care professional if they continue or are bothersome):  confusion  decreased hearing  diarrhea  facial  flushing  hair loss  headache  loss of appetite  missed menstrual periods  signs and symptoms of low red blood cells or anemia such as unusually weak or tired; feeling faint or lightheaded; falls  skin discoloration This list may not describe all possible side effects. Call your doctor for medical advice about side effects. You may report side effects to FDA at 1-800-FDA-1088. Where should I keep my medicine? This drug is given in a hospital or clinic and will not be stored at home. NOTE: This sheet is a summary. It may not cover all possible information. If you have questions about this medicine, talk to your doctor, pharmacist, or health care provider.  2021 Elsevier/Gold Standard (2018-11-06  09:53:29)  

## 2020-03-27 ENCOUNTER — Telehealth: Payer: Self-pay | Admitting: *Deleted

## 2020-03-28 ENCOUNTER — Encounter: Payer: Self-pay | Admitting: Rehabilitation

## 2020-03-28 ENCOUNTER — Other Ambulatory Visit: Payer: Self-pay

## 2020-03-28 ENCOUNTER — Inpatient Hospital Stay: Payer: Commercial Managed Care - PPO

## 2020-03-28 ENCOUNTER — Telehealth: Payer: Self-pay | Admitting: Hematology and Oncology

## 2020-03-28 ENCOUNTER — Ambulatory Visit: Payer: Commercial Managed Care - PPO | Attending: General Surgery | Admitting: Rehabilitation

## 2020-03-28 VITALS — BP 144/81 | HR 81 | Temp 98.3°F | Resp 18

## 2020-03-28 DIAGNOSIS — Z5111 Encounter for antineoplastic chemotherapy: Secondary | ICD-10-CM | POA: Diagnosis not present

## 2020-03-28 DIAGNOSIS — M25612 Stiffness of left shoulder, not elsewhere classified: Secondary | ICD-10-CM | POA: Insufficient documentation

## 2020-03-28 DIAGNOSIS — M25611 Stiffness of right shoulder, not elsewhere classified: Secondary | ICD-10-CM | POA: Insufficient documentation

## 2020-03-28 DIAGNOSIS — Z9013 Acquired absence of bilateral breasts and nipples: Secondary | ICD-10-CM | POA: Insufficient documentation

## 2020-03-28 DIAGNOSIS — Z483 Aftercare following surgery for neoplasm: Secondary | ICD-10-CM | POA: Diagnosis present

## 2020-03-28 DIAGNOSIS — Z170 Estrogen receptor positive status [ER+]: Secondary | ICD-10-CM

## 2020-03-28 DIAGNOSIS — C50511 Malignant neoplasm of lower-outer quadrant of right female breast: Secondary | ICD-10-CM

## 2020-03-28 MED ORDER — PEGFILGRASTIM-BMEZ 6 MG/0.6ML ~~LOC~~ SOSY
6.0000 mg | PREFILLED_SYRINGE | Freq: Once | SUBCUTANEOUS | Status: AC
  Administered 2020-03-28: 6 mg via SUBCUTANEOUS

## 2020-03-28 MED ORDER — PEGFILGRASTIM-BMEZ 6 MG/0.6ML ~~LOC~~ SOSY
PREFILLED_SYRINGE | SUBCUTANEOUS | Status: AC
  Filled 2020-03-28: qty 0.6

## 2020-03-28 NOTE — Therapy (Signed)
[place], [place], [address] Phone: [phone]   Fax:  [phone]  Physical Therapy Evaluation  Patient Details  Name: [name] MRN: [id number] Date of Birth: 12-03-1955 Referring Provider (PT): Dr. [name]   Encounter Date: 03/28/2020   PT End of Session - 03/28/20 0929    Visit Number 1    Number of Visits 6    Date for PT Re-Evaluation 05/09/20    Authorization Type MCR    Progress Note Due on Visit 10    PT Start Time 0803    PT Stop Time 0843    PT Time Calculation (min) 40 min    Activity Tolerance Patient tolerated treatment well    Behavior During Therapy [facility] for tasks assessed/performed           Past Medical History:  Diagnosis Date  . Complication of anesthesia   . Dyslipidemia   . Hypothyroidism   . Pneumonia   . PONV (postoperative nausea and vomiting)   . Thyroid cancer [facility])     Past Surgical History:  Procedure Laterality Date  . MASTECTOMY WITH AXILLARY LYMPH NODE DISSECTION Bilateral 02/28/2020   Procedure: BILATERAL MASTECTOMY WITH RIGHT SENTINEL LYMPH NODE MAPPING AND RIGHT TARGETED RADIOACTIVE SEED GUIDED  LYMPH NODE DISSECTION;  Surgeon: Jovita Kussmaul, MD;  Location: [facility];  Service: General;  Laterality: Bilateral;  PEC BLOCK, RNFA ([place])  . PORTACATH PLACEMENT Left 03/21/2020   Procedure: PORT PLACEMENT WITH ULTRASOUND;  Surgeon: Jovita Kussmaul, MD;  Location: [facility];  Service: General;  Laterality: Left;  . THYROIDECTOMY      There were no vitals filed for this visit.    Subjective Assessment - 03/28/20 0803    Subjective I am doing really well. I am starting chemotherapy.    Pertinent History bilateral mastectomy on 02/28/20 with Dr. [name] Rt IDC 6/7 nodes positive and Lt NED with 1 node removed.  Started chemotherapy with Paclitaxel this week and will have radiation with unknown start date. Port on the left side.     Limitations --   reports none   Patient Stated Goals do I need to do anything    Currently in Pain? No/denies              [facility] PT Assessment - 03/28/20 0001      Assessment   Medical Diagnosis Rt breast cancer    Referring Provider (PT) Dr. [name]    Onset Date/Surgical Date 02/28/20    Hand Dominance Right    Prior Therapy no      Precautions   Precaution Comments lymphedema Rt side      Balance Screen   Has the patient fallen in the past 6 months No    Has the patient had a decrease in activity level because of a fear of falling?  No    Is the patient reluctant to leave their home because of a fear of falling?  No      Home Environment   Living Environment Private residence    Living Arrangements Spouse/significant other      Prior Function   Vocation Requirements school principal    Leisure walking 2-6 miles per day.  Used to be at the gym before covid.      Observation/Other Assessments   Observations well healing incisions bilateral with excess dog ear laterally no edema      Sensation   Additional Comments numbness axilla  bil but improving      Posture/Postural Control   Posture/Postural Control Postural limitations      ROM / Strength   AROM / PROM / Strength AROM      AROM   AROM Assessment Site Shoulder    Right/Left Shoulder Right;Left    Right Shoulder Extension 48 Degrees    Right Shoulder Flexion 130 Degrees    Right Shoulder ABduction 122 Degrees    Left Shoulder Extension 35 Degrees    Left Shoulder Flexion 130 Degrees    Left Shoulder ABduction 113 Degrees             LYMPHEDEMA/ONCOLOGY QUESTIONNAIRE - 03/28/20 0001      Lymphedema Assessments   Lymphedema Assessments Upper extremities      Right Upper Extremity Lymphedema   10 cm Proximal to Olecranon Process 25.4 cm    Olecranon Process 24.2 cm    10 cm Proximal to Ulnar Styloid Process 20.2 cm    Just Proximal to Ulnar Styloid Process 14.8 cm    Across Hand at [employer]  19.8 cm    At [place] of 2nd Digit 6.7 cm      Left Upper Extremity Lymphedema   10 cm Proximal to Olecranon Process 25.5 cm    Olecranon Process 24.3 cm    10 cm Proximal to Ulnar Styloid Process 18 cm    Just Proximal to Ulnar Styloid Process 15 cm    Across Hand at [employer] 19.7 cm    At [place] of 2nd Digit 6.2 cm                 Quick Dash - 03/28/20 0001    Open a tight or new jar No difficulty    Do heavy household chores (wash walls, wash floors) Mild difficulty    Carry a shopping bag or briefcase No difficulty    Wash your back Moderate difficulty    Use a knife to cut food No difficulty    Recreational activities in which you take some force or impact through your arm, shoulder, or hand (golf, hammering, tennis) Moderate difficulty    During the past week, to what extent has your arm, shoulder or hand problem interfered with your normal social activities with family, friends, neighbors, or groups? Not at all    During the past week, to what extent has your arm, shoulder or hand problem limited your work or other regular daily activities [place]    Arm, shoulder, or hand pain. None    Tingling (pins and needles) in your arm, shoulder, or hand None    Difficulty Sleeping No difficulty    DASH Score 15.91 %            Objective measurements completed on examination: See above findings.       [facility] Adult PT Treatment/Exercise - 03/28/20 0001      Self-Care   Self-Care Other Self-Care Comments    Other Self-Care Comments  discussed lymphedema and lymphedema risk reduction as well as thoughts on sleeves with flying.      Exercises   Exercises Other Exercises    Other Exercises  gave pt post op exercise handout with addition of supine instruction and including wall flexion                  PT Education - 03/28/20 0929    Education Details post op stretches, POC, lymphedema risk reduction    Person(s) Educated Patient  Methods  Explanation;Demonstration;Handout    Comprehension Verbalized understanding;Verbal cues required;Tactile cues required;Need further instruction               PT Long Term Goals - 03/28/20 0949      PT LONG TERM GOAL #1   Title Pt will be educated on lymphedema surveillance and risk reduction    Time 1    Period Weeks    Status Achieved      PT LONG TERM GOAL #2   Title Pt will improve bil shoulder AROM to at least 150 flexion and abduction to improve AROM    Baseline Rt: flex: 130 Abd: 122,     Lt: flex: 130, Abd: 113    Time 6    Period Weeks    Status New      PT LONG TERM GOAL #3   Title Pt will be educated on strength ABC and return to light weights    Time 6    Period Weeks    Status New                  Plan - 03/28/20 0930    Clinical Impression Statement Pt presents 4 weeks post op from bilateral mastectomy with Rt SLNB doing well overall with moderate limitations in shoulder ROM bilaterally.  Pt had a recent Rt frozen shoulder and reports it may not have been equal to the other side going in to surgery.  No edema noted in the chest.  Circumferential measurements taken for baseline.  Pt was instructed in home stretches to try x 1 week and then return for more PT visits as needed.  Pt has ultimate goal of strength ABC or similar.    Personal Factors and Comorbidities Age;Comorbidity 1    Comorbidities SLNB    Examination-Activity Limitations Reach Overhead    Examination-Participation Restrictions Yard Work    Stability/Clinical Decision Making Stable/Uncomplicated    Clinical Decision Making Low    Rehab Potential Excellent    PT Frequency 1x / week    PT Duration 6 weeks    PT Treatment/Interventions ADLs/Self Care Home Management;Passive range of motion;Patient/family education;Manual lymph drainage;Manual techniques;Therapeutic exercise    PT Next Visit Plan recheck AROM, advance exercises or give supine scap, pulleys AAROM/PROM    PT Home Exercise  Plan post op    Consulted and Agree with Plan of Care Patient           Patient will benefit from skilled therapeutic intervention in order to improve the following deficits and impairments:  Decreased skin integrity,Decreased scar mobility,Decreased range of motion,Decreased strength,Impaired UE functional use  Visit Diagnosis: Aftercare following surgery for neoplasm  H/O bilateral mastectomy  Shoulder stiffness, left  Stiffness of right shoulder, not elsewhere classified     Problem List Patient Active Problem List   Diagnosis Date Noted  . Cancer of right female breast ([place]) 02/28/2020  . Malignant neoplasm of lower-outer quadrant of right breast of female, estrogen receptor positive ([place]) 08/08/2019    [name] 03/28/2020, 9:51 AM  [place], [place], [address] Phone: [phone]   Fax:  [phone]  Name: [name] MRN: [id number] Date of Birth: 19-Apr-1955

## 2020-03-28 NOTE — Patient Instructions (Signed)
Pegfilgrastim injection What is this medicine? PEGFILGRASTIM (PEG fil gra stim) is a long-acting granulocyte colony-stimulating factor that stimulates the growth of neutrophils, a type of white blood cell important in the body's fight against infection. It is used to reduce the incidence of fever and infection in patients with certain types of cancer who are receiving chemotherapy that affects the bone marrow, and to increase survival after being exposed to high doses of radiation. This medicine may be used for other purposes; ask your health care provider or pharmacist if you have questions. COMMON BRAND NAME(S): Fulphila, Neulasta, Nyvepria, UDENYCA, Ziextenzo What should I tell my health care provider before I take this medicine? They need to know if you have any of these conditions:  kidney disease  latex allergy  ongoing radiation therapy  sickle cell disease  skin reactions to acrylic adhesives (On-Body Injector only)  an unusual or allergic reaction to pegfilgrastim, filgrastim, other medicines, foods, dyes, or preservatives  pregnant or trying to get pregnant  breast-feeding How should I use this medicine? This medicine is for injection under the skin. If you get this medicine at home, you will be taught how to prepare and give the pre-filled syringe or how to use the On-body Injector. Refer to the patient Instructions for Use for detailed instructions. Use exactly as directed. Tell your healthcare provider immediately if you suspect that the On-body Injector may not have performed as intended or if you suspect the use of the On-body Injector resulted in a missed or partial dose. It is important that you put your used needles and syringes in a special sharps container. Do not put them in a trash can. If you do not have a sharps container, call your pharmacist or healthcare provider to get one. Talk to your pediatrician regarding the use of this medicine in children. While this drug  may be prescribed for selected conditions, precautions do apply. Overdosage: If you think you have taken too much of this medicine contact a poison control center or emergency room at once. NOTE: This medicine is only for you. Do not share this medicine with others. What if I miss a dose? It is important not to miss your dose. Call your doctor or health care professional if you miss your dose. If you miss a dose due to an On-body Injector failure or leakage, a new dose should be administered as soon as possible using a single prefilled syringe for manual use. What may interact with this medicine? Interactions have not been studied. This list may not describe all possible interactions. Give your health care provider a list of all the medicines, herbs, non-prescription drugs, or dietary supplements you use. Also tell them if you smoke, drink alcohol, or use illegal drugs. Some items may interact with your medicine. What should I watch for while using this medicine? Your condition will be monitored carefully while you are receiving this medicine. You may need blood work done while you are taking this medicine. Talk to your health care provider about your risk of cancer. You may be more at risk for certain types of cancer if you take this medicine. If you are going to need a MRI, CT scan, or other procedure, tell your doctor that you are using this medicine (On-Body Injector only). What side effects may I notice from receiving this medicine? Side effects that you should report to your doctor or health care professional as soon as possible:  allergic reactions (skin rash, itching or hives, swelling of   the face, lips, or tongue)  back pain  dizziness  fever  pain, redness, or irritation at site where injected  pinpoint red spots on the skin  red or dark-brown urine  shortness of breath or breathing problems  stomach or side pain, or pain at the shoulder  swelling  tiredness  trouble  passing urine or change in the amount of urine  unusual bruising or bleeding Side effects that usually do not require medical attention (report to your doctor or health care professional if they continue or are bothersome):  bone pain  muscle pain This list may not describe all possible side effects. Call your doctor for medical advice about side effects. You may report side effects to FDA at 1-800-FDA-1088. Where should I keep my medicine? Keep out of the reach of children. If you are using this medicine at home, you will be instructed on how to store it. Throw away any unused medicine after the expiration date on the label. NOTE: This sheet is a summary. It may not cover all possible information. If you have questions about this medicine, talk to your doctor, pharmacist, or health care provider.  2021 Elsevier/Gold Standard (2019-02-23 13:20:51)  

## 2020-03-28 NOTE — Telephone Encounter (Signed)
Scheduled per 2/9 los. Pt will receive an updated appt calendar per next visit appt notes 

## 2020-03-28 NOTE — Patient Instructions (Signed)

## 2020-03-31 ENCOUNTER — Telehealth: Payer: Self-pay | Admitting: Radiology

## 2020-03-31 NOTE — Telephone Encounter (Signed)
[facility]: TREATMENT OF REFRACTORY NAUSEA  03/31/2020       10:00AM  PHONE CALL: Confirmed I was speaking with [name]. Patient stated she did very well after Cycle 1, with very little to no nausea. Patient rated, over the phone, nausea as a 1 with no vomiting. She states when she did have slight nausea, it would come in waves, but after drinking water, it dissipated with the main nausea being on Friday/Saturday. She was able to continue doing her usual daily activities with no additional fatigue and had no issue with change in appetite. The change that was noted was that she felt that her food sometimes would get stuck just before reaching her stomach. Overall, patient has done very well after Cycle 1 and does not have nausea greater than/equal to 3, so will not continue on study. This clinical research coordinator verified if patient received e-mails through [facility]; patient stated she had been looking for them, but did not receive them. Her e-mail address was verified and this clinical research coordinator recommended patient to check her junk/spam folder. If patient has not received any e-mails, this clinical research coordinator requested for her to please fill out the paper copies given and she will return them on Wednesday. Thanked patient for her time and participation in the above mentioned study.   Carol Ada, RT(R)(T) Clinical Research Coordinator

## 2020-03-31 NOTE — Progress Notes (Incomplete)
New Breast Cancer Diagnosis: Right Breast- LOQ  Did patient present with symptoms (if so, please note symptoms) or screening mammography?:Palpable mass    CT CAP 08/24/2019: Right axillary and right retropectoral adenopathy compatible with nodal metastases. Indistinct sclerotic 1.2 cm left acetabular lesion. No other distant metastases  MammaPrint: Luminal type A, low risk, no benefit to chemotherapy  Bone scan: Negative for metastatic disease  Diagnostic Mammogram: 5 cm mass at 8:30 position, 3 cm intramammary lymph node at 10 o'clock position and 2 enlarged right axillary lymph nodes.  Location and Extent of disease :right breast. Located at 0830 o'clock position, measured 3 cm in greatest dimension. Adenopathy yes, intramammary lymph node measuring up to 6.5 mm seen at 10:00.  2 abnormal appearing right axillary lymph nodes were identified.  Histology per Pathology Report: grade 1, Invasive Ductal Carcinoma   Receptor Status: ER(positive90%), PR (positive10%), Her2-neu (negative), Ki-(10%)  Surgeon and surgical plan, if any: Dr. [name] -Bilateral [place] 02/28/2020      Medical oncologist, treatment if any:   Dr. Lindi Adie 03/26/2020 Treatment Summary: 1.Neoadjuvant antiestrogen therapy with anastrozole 1 mg daily started 08/08/2019 2.bilateral mastectomies1/13/2022: Right mastectomy with ALND: IDC 6.5 cm, 7/8 lymph nodes positive;left mastectomy: Benign ER 90%, PR 10%, Ki-67 10%, HER2 negative 3.given the fact that the patient had 7 positive lymph nodes, the standard of care would be to offer adjuvant chemotherapy. I recommended dose dense Adriamycin and Cytoxan followed by Taxol. 4.Adjradiation 5.Followed by adjuvant antiestrogen therapy with the CDK 4 and 6 inhibitor   Lymphedema issues, if any:  {:18581} {t:21944}   Pain issues, if any:  {:18581} {PAIN DESCRIPTION:21022940}  SAFETY ISSUES: Prior radiation? {:18581} Pacemaker/ICD? {:18581} Possible current  pregnancy?{:18581} Is the patient on methotrexate? {:18581}  Current Complaints / other details:   -Port placed 03/21/2020

## 2020-04-01 ENCOUNTER — Telehealth: Payer: Self-pay | Admitting: Radiation Oncology

## 2020-04-01 ENCOUNTER — Ambulatory Visit
Admission: RE | Admit: 2020-04-01 | Payer: Commercial Managed Care - PPO | Source: Ambulatory Visit | Admitting: Radiation Oncology

## 2020-04-01 ENCOUNTER — Ambulatory Visit: Payer: Commercial Managed Care - PPO

## 2020-04-01 NOTE — Telephone Encounter (Signed)
Rec'd a call from patient to cancel her [facility] appointments for this morning due to her stating that Dr. Lindi Adie wanted her to do more chemo after the latest path report came out. I have let Delene Ruffini, RN know and she will communicate this to Shona Simpson, PA.

## 2020-04-01 NOTE — Progress Notes (Signed)
HEMATOLOGY-ONCOLOGY VISIT PROGRESS NOTE   CHIEF COMPLIANT: Cycle 1 Day 8 Adrimycin and Cytoxan  INTERVAL HISTORY: [name] is a 65 y.o. female with above-mentioned history of right breast cancer treated with neoadjuvant antiestrogen therapy, bilateral mastectomies, and who is currently on adjuvant chemotherapy with dose dense Adriamcyin and Cytoxan. She presents today for a toxicity check following cycle 1.  She tolerated chemo extremely well.  She did not have any nausea or vomiting.  She did not require any antinausea medications either.  She did not have any change in taste or appetite.  He has been working from home and it has not affected her quality of life.  Very mild fatigue but that is inconsequential.  Oncology History  Malignant neoplasm of lower-outer quadrant of right breast of female, estrogen receptor positive ([place])  08/01/2019 Initial Diagnosis   Palpable right breast mass for 1 month.  Mammogram revealed a 5 cm mass 8:30 position, 3 cm intramammary lymph node at 10 o'clock position and 2 enlarged right axillary lymph nodes.  Biopsy revealed grade 1 IDC ER 90%, PR 10%, Ki-67 10%, HER-2 negative.  Intramammary node and axillary lymph node both positive with similar prognostic profile   08/08/2019 -  Neo-Adjuvant Anti-estrogen oral therapy   Anastrozole while deciding treatment plan    02/28/2020 Surgery   Bilateral mastectomies [name]):  Right breast: IDC, 6.5cm, metastatic carcinoma in 6/7 right axillary lymph nodes, 1.5cm, 1.5cm, and 0.3cm, and one intramammary lymph node, 2.5cm Left breast: no evidence of malignancy in the breast or 1 left axillary lymph node.   03/26/2020 -  Chemotherapy    Patient is on Treatment Plan: BREAST ADJUVANT DOSE DENSE AC Q14D / PACLITAXEL Q7D        Observations/Objective:  Today's Vitals   04/02/20 1128  BP: 127/79  Pulse: 94  Resp: 17  Temp: 97.9 F (36.6 C)  TempSrc: Temporal  SpO2: 100%  Weight: 146 lb 3.2 oz (66.3 kg)   Height: '5\' 7"'  (1.702 m)   Body mass index is 22.9 kg/m.  I have reviewed the data as listed CMP Latest Ref Rng & Units 03/26/2020 02/25/2020 08/23/2019  Glucose 70 - 99 mg/dL 137(H) 111(H) 122(H)  BUN 8 - 23 mg/dL '11 14 13  ' Creatinine 0.44 - 1.00 mg/dL 0.75 0.70 0.83  Sodium 135 - 145 mmol/L 140 140 143  Potassium 3.5 - 5.1 mmol/L 3.6 3.8 4.4  Chloride 98 - 111 mmol/L 106 103 105  CO2 22 - 32 mmol/L '25 28 27  ' Calcium 8.9 - 10.3 mg/dL 9.0 9.5 9.9  Total Protein 6.5 - 8.1 g/dL 7.4 - 7.9  Total Bilirubin 0.3 - 1.2 mg/dL 0.5 - 0.5  Alkaline Phos 38 - 126 U/L 72 - 66  AST 15 - 41 U/L 14(L) - 16  ALT 0 - 44 U/L 15 - 16    Lab Results  Component Value Date   WBC 5.5 04/02/2020   HGB 12.6 04/02/2020   HCT 37.1 04/02/2020   MCV 88.5 04/02/2020   PLT 127 (L) 04/02/2020   NEUTROABS PENDING 04/02/2020      Assessment Plan:  Malignant neoplasm of lower-outer quadrant of right breast of female, estrogen receptor positive ([place]) 08/01/2019:Palpable right breast mass for 1 month. Mammogram revealed a 5 cm mass 8:30 position, 3 cm intramammary lymph node at 10 o'clock position and 2 enlarged right axillary lymph nodes. Biopsy revealed grade 1 IDC ER 90%, PR 10%, Ki-67 10%, HER-2 negative. Intramammary node and axillary lymph node  both positive with similar prognostic profile  Breast MRI 08/15/2019: Biopsy-proven malignancy lower outer quadrant right breast, non-mass enhancement spanning 8.7 cm with probable invasion of underlying pectoral muscle. Biopsy-proven metastatic lymph node in the right axillary tail. Mild asymmetric right infraclavicular adenopathy.Biopsy positive for invasive ductal carcinoma grade 1  CT CAP 08/24/2019: Right axillary and right retropectoral adenopathy compatible with nodal metastases. Indistinct sclerotic 1.2 cm left acetabular lesion. No other distant metastases MammaPrint: Luminal type A, low risk, no benefit to chemotherapy Bone scan: Negative for  metastatic disease  Treatment Summary: 1.Neoadjuvant antiestrogen therapy with anastrozole 1 mg daily started 08/08/2019 2.bilateral mastectomies1/13/2022: Right mastectomy with ALND: IDC 6.5 cm, 7/8 lymph nodes positive;left mastectomy: Benign ER 90%, PR 10%, Ki-67 10%, HER2 negative 3.given the fact that the patient had 7 positive lymph nodes, the standard of care would be to offer adjuvant chemotherapy. I recommended dose dense Adriamycin and Cytoxan followed by Taxol. 4.Adjradiation 5.Followed by adjuvant antiestrogen therapy with the CDK 4 and 6 inhibitor ----------------------------------------------------------------------------------------------------------------------------- Current Treatment: Cycle 1 day 8 Dose dense Adriamycin and Cytoxan  Chemo toxicities: Tolerating chemo extremely well. Denies any nausea or vomiting.  Denies any change in appetite or taste. Excellent energy levels.  Labs have been reviewed. We will keep the dosage of cycle 2 the same. Return to clinic in 1 week for cycle 2    I discussed the assessment and treatment plan with the patient. The patient was provided an opportunity to ask questions and all were answered. The patient agreed with the plan and demonstrated an understanding of the instructions. The patient was advised to call back or seek an in-person evaluation if the symptoms worsen or if the condition fails to improve as anticipated.   Total time spent: 30 minutes including face-to-face MyChart video visit time and time spent for planning, charting and coordination of care  Rulon Eisenmenger, MD 04/02/2020   I, Cloyde Reams Dorshimer, am acting as scribe for [name], MD.  I have reviewed the above documentation for accuracy and completeness, and I agree with the above.

## 2020-04-02 ENCOUNTER — Inpatient Hospital Stay: Payer: Commercial Managed Care - PPO

## 2020-04-02 ENCOUNTER — Inpatient Hospital Stay (HOSPITAL_BASED_OUTPATIENT_CLINIC_OR_DEPARTMENT_OTHER): Payer: Commercial Managed Care - PPO | Admitting: Hematology and Oncology

## 2020-04-02 ENCOUNTER — Other Ambulatory Visit: Payer: Self-pay

## 2020-04-02 ENCOUNTER — Encounter: Payer: Self-pay | Admitting: *Deleted

## 2020-04-02 DIAGNOSIS — C50511 Malignant neoplasm of lower-outer quadrant of right female breast: Secondary | ICD-10-CM | POA: Diagnosis not present

## 2020-04-02 DIAGNOSIS — Z170 Estrogen receptor positive status [ER+]: Secondary | ICD-10-CM | POA: Diagnosis not present

## 2020-04-02 DIAGNOSIS — Z95828 Presence of other vascular implants and grafts: Secondary | ICD-10-CM

## 2020-04-02 DIAGNOSIS — Z5111 Encounter for antineoplastic chemotherapy: Secondary | ICD-10-CM | POA: Diagnosis not present

## 2020-04-02 LAB — CBC WITH DIFFERENTIAL (CANCER CENTER ONLY)
Abs Immature Granulocytes: 0.08 10*3/uL — ABNORMAL HIGH (ref 0.00–0.07)
Basophils Absolute: 0.1 10*3/uL (ref 0.0–0.1)
Basophils Relative: 1 %
Eosinophils Absolute: 0.2 10*3/uL (ref 0.0–0.5)
Eosinophils Relative: 4 %
HCT: 37.1 % (ref 36.0–46.0)
Hemoglobin: 12.6 g/dL (ref 12.0–15.0)
Immature Granulocytes: 2 %
Lymphocytes Relative: 27 %
Lymphs Abs: 1.5 10*3/uL (ref 0.7–4.0)
MCH: 30.1 pg (ref 26.0–34.0)
MCHC: 34 g/dL (ref 30.0–36.0)
MCV: 88.5 fL (ref 80.0–100.0)
Monocytes Absolute: 0.3 10*3/uL (ref 0.1–1.0)
Monocytes Relative: 6 %
Neutro Abs: 3.4 10*3/uL (ref 1.7–7.7)
Neutrophils Relative %: 60 %
Platelet Count: 127 10*3/uL — ABNORMAL LOW (ref 150–400)
RBC: 4.19 MIL/uL (ref 3.87–5.11)
RDW: 11.9 % (ref 11.5–15.5)
WBC Count: 5.5 10*3/uL (ref 4.0–10.5)
nRBC: 0 % (ref 0.0–0.2)

## 2020-04-02 LAB — CMP (CANCER CENTER ONLY)
ALT: 14 U/L (ref 0–44)
AST: 10 U/L — ABNORMAL LOW (ref 15–41)
Albumin: 4.2 g/dL (ref 3.5–5.0)
Alkaline Phosphatase: 91 U/L (ref 38–126)
Anion gap: 6 (ref 5–15)
BUN: 10 mg/dL (ref 8–23)
CO2: 27 mmol/L (ref 22–32)
Calcium: 9 mg/dL (ref 8.9–10.3)
Chloride: 104 mmol/L (ref 98–111)
Creatinine: 0.73 mg/dL (ref 0.44–1.00)
GFR, Estimated: >60 mL/min (ref >60)
Glucose, Bld: 121 mg/dL — ABNORMAL HIGH (ref 70–99)
Potassium: 3.6 mmol/L (ref 3.5–5.1)
Sodium: 137 mmol/L (ref 135–145)
Total Bilirubin: 0.5 mg/dL (ref 0.3–1.2)
Total Protein: 7.1 g/dL (ref 6.5–8.1)

## 2020-04-02 MED ORDER — SODIUM CHLORIDE 0.9% FLUSH
10.0000 mL | INTRAVENOUS | Status: DC | PRN
  Administered 2020-04-02: 10 mL via INTRAVENOUS
  Filled 2020-04-02: qty 10

## 2020-04-02 MED ORDER — HEPARIN SOD (PORK) LOCK FLUSH 100 UNIT/ML IV SOLN
500.0000 [IU] | Freq: Once | INTRAVENOUS | Status: AC
  Administered 2020-04-02: 500 [IU] via INTRAVENOUS
  Filled 2020-04-02: qty 5

## 2020-04-02 NOTE — Assessment & Plan Note (Signed)
08/01/2019:Palpable right breast mass for 1 month. Mammogram revealed a 5 cm mass 8:30 position, 3 cm intramammary lymph node at 10 o'clock position and 2 enlarged right axillary lymph nodes. Biopsy revealed grade 1 IDC ER 90%, PR 10%, Ki-67 10%, HER-2 negative. Intramammary node and axillary lymph node both positive with similar prognostic profile  Breast MRI 08/15/2019: Biopsy-proven malignancy lower outer quadrant right breast, non-mass enhancement spanning 8.7 cm with probable invasion of underlying pectoral muscle. Biopsy-proven metastatic lymph node in the right axillary tail. Mild asymmetric right infraclavicular adenopathy.Biopsy positive for invasive ductal carcinoma grade 1  CT CAP 08/24/2019: Right axillary and right retropectoral adenopathy compatible with nodal metastases. Indistinct sclerotic 1.2 cm left acetabular lesion. No other distant metastases MammaPrint: Luminal type A, low risk, no benefit to chemotherapy Bone scan: Negative for metastatic disease  Treatment Summary: 1.Neoadjuvant antiestrogen therapy with anastrozole 1 mg daily started 08/08/2019 2.bilateral mastectomies1/13/2022: Right mastectomy with ALND: IDC 6.5 cm, 7/8 lymph nodes positive;left mastectomy: Benign ER 90%, PR 10%, Ki-67 10%, HER2 negative 3.given the fact that the patient had 7 positive lymph nodes, the standard of care would be to offer adjuvant chemotherapy. I recommended dose dense Adriamycin and Cytoxan followed by Taxol. 4.Adjradiation 5.Followed by adjuvant antiestrogen therapy with the CDK 4 and 6 inhibitor ----------------------------------------------------------------------------------------------------------------------------- Current Treatment: Cycle 1 day 8 Dose dense Adriamycin and Cytoxan  Chemo toxicities:  Return to clinic in 1 week for cycle 2

## 2020-04-02 NOTE — Patient Instructions (Signed)

## 2020-04-04 ENCOUNTER — Encounter: Payer: Self-pay | Admitting: Rehabilitation

## 2020-04-04 ENCOUNTER — Other Ambulatory Visit: Payer: Self-pay

## 2020-04-04 ENCOUNTER — Ambulatory Visit: Payer: Commercial Managed Care - PPO | Admitting: Rehabilitation

## 2020-04-04 DIAGNOSIS — M25612 Stiffness of left shoulder, not elsewhere classified: Secondary | ICD-10-CM

## 2020-04-04 DIAGNOSIS — M25611 Stiffness of right shoulder, not elsewhere classified: Secondary | ICD-10-CM

## 2020-04-04 DIAGNOSIS — Z483 Aftercare following surgery for neoplasm: Secondary | ICD-10-CM

## 2020-04-04 DIAGNOSIS — Z9013 Acquired absence of bilateral breasts and nipples: Secondary | ICD-10-CM

## 2020-04-04 NOTE — Patient Instructions (Signed)
Access Code: [email or web address] URL: [email or web address]: 02/18/2022Prepared by: Marcene Brawn TevisExercises  Standing Shoulder Flexion Wall Walk - 1 x daily - 7 x weekly - 1-3 sets - 10 reps - 5 seconds hold  Standing Shoulder Abduction Finger Walk at Wall - 1 x daily - 7 x weekly - 1-3 sets - 10 reps - 5 seconds hold  Doorway Pec Stretch at 90 Degrees Abduction - 1 x daily - 7 x weekly - 1-3 sets - 2 reps - 20-30 seconds hold  Standing Row with Anchored Resistance - 1 x daily - 3-4 x weekly - 1-3 sets - 10 reps - 2-3 second hold  Standing Shoulder External Rotation with Resistance - 1 x daily - 3-4 x weekly - 1-3 sets - 10 reps - 2-3 seconds hold  Supine Shoulder Flexion Extension AAROM with Dowel - 1-2 x daily - 7 x weekly - 5 reps - 5 seconds hold

## 2020-04-04 NOTE — Therapy (Signed)
[place], [place], [address] Phone: [phone]   Fax:  [phone]  Physical Therapy Treatment  Patient Details  Name: [name] MRN: [id number] Date of Birth: [birth year] Referring Provider (PT): Dr. [name]   Encounter Date: 04/04/2020   PT End of Session - 04/04/20 0850    Visit Number 2    Number of Visits 6    Date for PT Re-Evaluation 05/09/20    Progress Note Due on Visit 10    PT Start Time 0800    PT Stop Time 0847    PT Time Calculation (min) 47 min    Activity Tolerance Patient tolerated treatment well    Behavior During Therapy [facility] for tasks assessed/performed           Past Medical History:  Diagnosis Date  . Complication of anesthesia   . Dyslipidemia   . Hypothyroidism   . Pneumonia   . PONV (postoperative nausea and vomiting)   . Thyroid cancer [facility])     Past Surgical History:  Procedure Laterality Date  . MASTECTOMY WITH AXILLARY LYMPH NODE DISSECTION Bilateral 02/28/2020   Procedure: BILATERAL MASTECTOMY WITH RIGHT SENTINEL LYMPH NODE MAPPING AND RIGHT TARGETED RADIOACTIVE SEED GUIDED  LYMPH NODE DISSECTION;  Surgeon: Jovita Kussmaul, MD;  Location: [facility];  Service: General;  Laterality: Bilateral;  PEC BLOCK, RNFA ([place])  . PORTACATH PLACEMENT Left 03/21/2020   Procedure: PORT PLACEMENT WITH ULTRASOUND;  Surgeon: Jovita Kussmaul, MD;  Location: [facility];  Service: General;  Laterality: Left;  . THYROIDECTOMY      There were no vitals filed for this visit.   Subjective Assessment - 04/04/20 0801    Subjective I hit my chemo fatigue wall on Sat and Sun and then felt pretty good on Monday.    Pertinent History bilateral mastectomy on 02/28/20 with Dr. [name] Rt IDC 6/7 nodes positive and Lt NED with 1 node removed.  Started chemotherapy with Paclitaxel this week and will have radiation with unknown start date. Port on the left side.    Currently  in Pain? No/denies              [facility] PT Assessment - 04/04/20 0001      AROM   Right Shoulder Flexion 140 Degrees   with stretch   Right Shoulder ABduction 142 Degrees   stretch   Left Shoulder Flexion 142 Degrees    Left Shoulder ABduction 150 Degrees                         [facility] Adult PT Treatment/Exercise - 04/04/20 0001      Exercises   Exercises Shoulder      Shoulder Exercises: Standing   External Rotation Both;10 reps    Theraband Level (Shoulder External Rotation) Level 1 (Yellow)    Row 10 reps;Both    Theraband Level (Shoulder Row) Level 1 (Yellow)      Shoulder Exercises: Pulleys   Flexion 2 minutes    Flexion Limitations with cueing    Scaption 2 minutes    Scaption Limitations with cueing      Shoulder Exercises: Therapy Ball   Flexion 5 reps;Both      Manual Therapy   Manual Therapy Passive ROM    Passive ROM to bil shoulders into flexion, abduction, ER to tolerance  PT Education - 04/04/20 0849    Education Details updated HEP    Person(s) Educated Patient    Methods Explanation;Demonstration;Tactile cues;Verbal cues;Handout    Comprehension Verbalized understanding               PT Long Term Goals - 03/28/20 0949      PT LONG TERM GOAL #1   Title Pt will be educated on lymphedema surveillance and risk reduction    Time 1    Period Weeks    Status Achieved      PT LONG TERM GOAL #2   Title Pt will improve bil shoulder AROM to at least 150 flexion and abduction to improve AROM    Baseline Rt: flex: 130 Abd: 122,     Lt: flex: 130, Abd: 113    Time 6    Period Weeks    Status New      PT LONG TERM GOAL #3   Title Pt will be educated on strength ABC and return to light weights    Time 6    Period Weeks    Status New                 Plan - 04/04/20 1941    Clinical Impression Statement Pt returns with improved bilateral shoulder ROM but still limited and with pectoralis attempted  spasm during PROM today which may lead to some of the decreased AROM.  Pt will start some more PT sessions for AAROM/PROM to maximize ROM.    PT Frequency 1x / week    PT Duration 6 weeks    PT Treatment/Interventions ADLs/Self Care Home Management;Passive range of motion;Patient/family education;Manual lymph drainage;Manual techniques;Therapeutic exercise    PT Next Visit Plan recheck AROM, pulleys AAROM/PROM, PROM bil, try supine scap    PT Home Exercise Plan Access Code: [email or web address]    Consulted and Agree with Plan of Care Patient           Patient will benefit from skilled therapeutic intervention in order to improve the following deficits and impairments:     Visit Diagnosis: Aftercare following surgery for neoplasm  H/O bilateral mastectomy  Shoulder stiffness, left  Stiffness of right shoulder, not elsewhere classified     Problem List Patient Active Problem List   Diagnosis Date Noted  . Cancer of right female breast ([place]) 02/28/2020  . Malignant neoplasm of lower-outer quadrant of right breast of female, estrogen receptor positive ([place]) 08/08/2019    [name] 04/04/2020, 8:53 AM  [place], [place], [address] Phone: [phone]   Fax:  [phone]  Name: IMMACULATE CRUTCHER MRN: [id number] Date of Birth: 1955/09/22

## 2020-04-08 ENCOUNTER — Telehealth: Payer: Self-pay | Admitting: Radiology

## 2020-04-08 NOTE — Telephone Encounter (Signed)
[facility]: TREATMENT OF REFRACTORY NAUSEA  04/08/20     3:45PM  PHONE CALL: Left message for patient concerning Cycle 1 questionnaires (Nausea control) to be completed. Explained patient could call back with any questions and that this clinical research coordinator would be planning to complete questionnaires when she is in clinic on Thursday 04/10/20.  Carol Ada, RT(R)(T) Clinical Research Coordinator

## 2020-04-09 NOTE — Progress Notes (Signed)
Patient Care Team: Deland Pretty, MD as PCP - General (Internal Medicine) Mauro Kaufmann, RN as Medical Oncologist Rockwell Germany, RN as Oncology Nurse Navigator  DIAGNOSIS:    ICD-10-CM   1. Malignant neoplasm of lower-outer quadrant of right breast of female, estrogen receptor positive ([place])  C50.511    Z17.0     SUMMARY OF ONCOLOGIC HISTORY: Oncology History  Malignant neoplasm of lower-outer quadrant of right breast of female, estrogen receptor positive ([place])  08/01/2019 Initial Diagnosis   Palpable right breast mass for 1 month.  Mammogram revealed a 5 cm mass 8:30 position, 3 cm intramammary lymph node at 10 o'clock position and 2 enlarged right axillary lymph nodes.  Biopsy revealed grade 1 IDC ER 90%, PR 10%, Ki-67 10%, HER-2 negative.  Intramammary node and axillary lymph node both positive with similar prognostic profile   08/08/2019 -  Neo-Adjuvant Anti-estrogen oral therapy   Anastrozole while deciding treatment plan    02/28/2020 Surgery   Bilateral mastectomies [name]):  Right breast: IDC, 6.5cm, metastatic carcinoma in 6/7 right axillary lymph nodes, 1.5cm, 1.5cm, and 0.3cm, and one intramammary lymph node, 2.5cm Left breast: no evidence of malignancy in the breast or 1 left axillary lymph node.   03/26/2020 -  Chemotherapy    Patient is on Treatment Plan: BREAST ADJUVANT DOSE DENSE AC Q14D / PACLITAXEL Q7D        CHIEF COMPLIANT: Cycle 2 Day 1 Adrimycin and Cytoxan  INTERVAL HISTORY: [name] is a 65 y.o. with above-mentioned history of right breast cancer treated with neoadjuvant antiestrogen therapy, bilateral mastectomies, and who is currently on adjuvant chemotherapy with dose dense Adriamcyin and Cytoxan. She presents today for a toxicity check and treatment.    ALLERGIES:  has No Known Allergies.  MEDICATIONS:  Current Outpatient Medications  Medication Sig Dispense Refill   calcium carbonate (OS-CAL) 1250 (500 Ca) MG chewable tablet Chew 1  tablet by mouth daily.     cholecalciferol (VITAMIN D3) 25 MCG (1000 UNIT) tablet Take 1,000 Units by mouth daily.     dexamethasone (DECADRON) 4 MG tablet Take 2 tablets (8 mg total) by mouth daily. Take daily for 3 days after chemo. Take with food. 30 tablet 1   levothyroxine (SYNTHROID) 125 MCG tablet Take 1 tablet (125 mcg total) by mouth daily before breakfast.     lidocaine-prilocaine (EMLA) cream Apply to affected area once 30 g 3   Multiple Vitamins-Minerals (MULTIVITAMIN WITH MINERALS) tablet Take 1 tablet by mouth daily.     ondansetron (ZOFRAN) 8 MG tablet Take 1 tablet (8 mg total) by mouth 2 (two) times daily as needed. Start on the third day after chemotherapy. 30 tablet 1   prochlorperazine (COMPAZINE) 10 MG tablet Take 1 tablet (10 mg total) by mouth every 6 (six) hours as needed (Nausea or vomiting). 30 tablet 1   rosuvastatin (CRESTOR) 10 MG tablet Take 1 tablet (10 mg total) by mouth daily.     No current facility-administered medications for this visit.   Facility-Administered Medications Ordered in Other Visits  Medication Dose Route Frequency Provider Last Rate Last Admin   sodium chloride flush (NS) 0.9 % injection 10 mL  10 mL Intravenous PRN [name], MD   10 mL at 04/10/20 1307    PHYSICAL EXAMINATION: ECOG PERFORMANCE STATUS: 1 - Symptomatic but completely ambulatory  There were no vitals filed for this visit. There were no vitals filed for this visit.  LABORATORY DATA:  I have reviewed the  data as listed CMP Latest Ref Rng & Units 04/02/2020 03/26/2020 02/25/2020  Glucose 70 - 99 mg/dL 121(H) 137(H) 111(H)  BUN 8 - 23 mg/dL _0 Creatinine 0.44 - 1.00 mg/dL 0.73 0.75 0.70  Sodium 135 - 145 mmol/L 137 140 140  Potassium 3.5 - 5.1 mmol/L 3.6 3.6 3.8  Chloride 98 - 111 mmol/L 104 106 103  CO2 22 - 32 mmol/L _1 Calcium 8.9 - 10.3 mg/dL 9.0 9.0 9.5  Total Protein 6.5 - 8.1 g/dL 7.1 7.4 -  Total Bilirubin 0.3 - 1.2 mg/dL 0.5 0.5 -   Alkaline Phos 38 - 126 U/L 91 72 -  AST 15 - 41 U/L 10(L) 14(L) -  ALT 0 - 44 U/L 14 15 -    Lab Results  Component Value Date   WBC 5.5 04/02/2020   HGB 12.6 04/02/2020   HCT 37.1 04/02/2020   MCV 88.5 04/02/2020   PLT 127 (L) 04/02/2020   NEUTROABS 3.4 04/02/2020    ASSESSMENT & PLAN:  Malignant neoplasm of lower-outer quadrant of right breast of female, estrogen receptor positive ([place]) 08/01/2019:Palpable right breast mass for 1 month. Mammogram revealed a 5 cm mass 8:30 position, 3 cm intramammary lymph node at 10 o'clock position and 2 enlarged right axillary lymph nodes. Biopsy revealed grade 1 IDC ER 90%, PR 10%, Ki-67 10%, HER-2 negative. Intramammary node and axillary lymph node both positive with similar prognostic profile  Breast MRI 08/15/2019: Biopsy-proven malignancy lower outer quadrant right breast, non-mass enhancement spanning 8.7 cm with probable invasion of underlying pectoral muscle. Biopsy-proven metastatic lymph node in the right axillary tail. Mild asymmetric right infraclavicular adenopathy.Biopsy positive for invasive ductal carcinoma grade 1  CT CAP 08/24/2019: Right axillary and right retropectoral adenopathy compatible with nodal metastases. Indistinct sclerotic 1.2 cm left acetabular lesion. No other distant metastases MammaPrint: Luminal type A, low risk, no benefit to chemotherapy Bone scan: Negative for metastatic disease  TreatmentSummary: 1.Neoadjuvant antiestrogen therapy with anastrozole 1 mg daily started 08/08/2019 2.bilateral mastectomies1/13/2022: Right mastectomy with ALND: IDC 6.5 cm, 7/8 lymph nodes positive;left mastectomy: Benign ER 90%, PR 10%, Ki-67 10%, HER2 negative 3.given the fact that the patient had 7 positive lymph nodes, the standard of care would be to offer adjuvant chemotherapy. I recommended dose dense Adriamycin and Cytoxan followed by Taxol. 4.Adjradiation 5.Followed by adjuvant antiestrogen therapy  with the CDK 4 and 6 inhibitor ----------------------------------------------------------------------------------------------------------------------------- Current Treatment: Cycle 2 Dose dense Adriamycin and Cytoxan  Chemo toxicities: Tolerating chemo extremely well. Denies any nausea or vomiting.  Denies any change in appetite or taste. Leukocytosis: Secondary to Neulasta injection.  Return to clinic in 2 weeks for cycle 3    No orders of the defined types were placed in this encounter.  The patient has a good understanding of the overall plan. she agrees with it. she will call with any problems that may develop before the next visit here.  Total time spent: 30 mins including face to face time and time spent for planning, charting and coordination of care  Rulon Eisenmenger, MD, MPH 04/10/2020  I, Cloyde Reams Dorshimer, am acting as scribe for Dr. [name].  I have reviewed the above documentation for accuracy and completeness, and I agree with the above.

## 2020-04-10 ENCOUNTER — Encounter: Payer: Self-pay | Admitting: Radiology

## 2020-04-10 ENCOUNTER — Other Ambulatory Visit: Payer: Self-pay

## 2020-04-10 ENCOUNTER — Inpatient Hospital Stay (HOSPITAL_BASED_OUTPATIENT_CLINIC_OR_DEPARTMENT_OTHER): Payer: Commercial Managed Care - PPO | Admitting: Hematology and Oncology

## 2020-04-10 ENCOUNTER — Inpatient Hospital Stay: Payer: Commercial Managed Care - PPO

## 2020-04-10 DIAGNOSIS — C50511 Malignant neoplasm of lower-outer quadrant of right female breast: Secondary | ICD-10-CM

## 2020-04-10 DIAGNOSIS — Z170 Estrogen receptor positive status [ER+]: Secondary | ICD-10-CM

## 2020-04-10 DIAGNOSIS — Z95828 Presence of other vascular implants and grafts: Secondary | ICD-10-CM

## 2020-04-10 DIAGNOSIS — Z5111 Encounter for antineoplastic chemotherapy: Secondary | ICD-10-CM | POA: Diagnosis not present

## 2020-04-10 LAB — CBC WITH DIFFERENTIAL (CANCER CENTER ONLY)
Abs Immature Granulocytes: 2.21 10*3/uL — ABNORMAL HIGH (ref 0.00–0.07)
Basophils Absolute: 0.2 10*3/uL — ABNORMAL HIGH (ref 0.0–0.1)
Basophils Relative: 1 %
Eosinophils Absolute: 0 10*3/uL (ref 0.0–0.5)
Eosinophils Relative: 0 %
HCT: 34.6 % — ABNORMAL LOW (ref 36.0–46.0)
Hemoglobin: 11.9 g/dL — ABNORMAL LOW (ref 12.0–15.0)
Immature Granulocytes: 9 %
Lymphocytes Relative: 11 %
Lymphs Abs: 2.8 10*3/uL (ref 0.7–4.0)
MCH: 30.4 pg (ref 26.0–34.0)
MCHC: 34.4 g/dL (ref 30.0–36.0)
MCV: 88.3 fL (ref 80.0–100.0)
Monocytes Absolute: 1.4 10*3/uL — ABNORMAL HIGH (ref 0.1–1.0)
Monocytes Relative: 6 %
Neutro Abs: 18 10*3/uL — ABNORMAL HIGH (ref 1.7–7.7)
Neutrophils Relative %: 73 %
Platelet Count: 162 10*3/uL (ref 150–400)
RBC: 3.92 MIL/uL (ref 3.87–5.11)
RDW: 12.4 % (ref 11.5–15.5)
WBC Count: 24.6 10*3/uL — ABNORMAL HIGH (ref 4.0–10.5)
nRBC: 0.2 % (ref 0.0–0.2)

## 2020-04-10 LAB — CMP (CANCER CENTER ONLY)
ALT: 18 U/L (ref 0–44)
AST: 15 U/L (ref 15–41)
Albumin: 4.3 g/dL (ref 3.5–5.0)
Alkaline Phosphatase: 112 U/L (ref 38–126)
Anion gap: 9 (ref 5–15)
BUN: 13 mg/dL (ref 8–23)
CO2: 24 mmol/L (ref 22–32)
Calcium: 9.1 mg/dL (ref 8.9–10.3)
Chloride: 107 mmol/L (ref 98–111)
Creatinine: 0.68 mg/dL (ref 0.44–1.00)
GFR, Estimated: >60 mL/min (ref >60)
Glucose, Bld: 137 mg/dL — ABNORMAL HIGH (ref 70–99)
Potassium: 3.5 mmol/L (ref 3.5–5.1)
Sodium: 140 mmol/L (ref 135–145)
Total Bilirubin: 0.3 mg/dL (ref 0.3–1.2)
Total Protein: 7.2 g/dL (ref 6.5–8.1)

## 2020-04-10 MED ORDER — SODIUM CHLORIDE 0.9 % IV SOLN
150.0000 mg | Freq: Once | INTRAVENOUS | Status: AC
  Administered 2020-04-10: 150 mg via INTRAVENOUS
  Filled 2020-04-10: qty 150

## 2020-04-10 MED ORDER — SODIUM CHLORIDE 0.9% FLUSH
10.0000 mL | INTRAVENOUS | Status: DC | PRN
  Administered 2020-04-10: 10 mL via INTRAVENOUS
  Filled 2020-04-10: qty 10

## 2020-04-10 MED ORDER — PALONOSETRON HCL INJECTION 0.25 MG/5ML
0.2500 mg | Freq: Once | INTRAVENOUS | Status: AC
Start: 2020-04-10 — End: 2020-04-10
  Administered 2020-04-10: 0.25 mg via INTRAVENOUS

## 2020-04-10 MED ORDER — DOXORUBICIN HCL CHEMO IV INJECTION 2 MG/ML
60.0000 mg/m2 | Freq: Once | INTRAVENOUS | Status: AC
  Administered 2020-04-10: 106 mg via INTRAVENOUS
  Filled 2020-04-10: qty 53

## 2020-04-10 MED ORDER — SODIUM CHLORIDE 0.9 % IV SOLN
10.0000 mg | Freq: Once | INTRAVENOUS | Status: AC
  Administered 2020-04-10: 10 mg via INTRAVENOUS
  Filled 2020-04-10: qty 10

## 2020-04-10 MED ORDER — SODIUM CHLORIDE 0.9% FLUSH
10.0000 mL | INTRAVENOUS | Status: DC | PRN
  Administered 2020-04-10: 10 mL
  Filled 2020-04-10: qty 10

## 2020-04-10 MED ORDER — SODIUM CHLORIDE 0.9 % IV SOLN
Freq: Once | INTRAVENOUS | Status: AC
  Filled 2020-04-10: qty 250

## 2020-04-10 MED ORDER — PALONOSETRON HCL INJECTION 0.25 MG/5ML
INTRAVENOUS | Status: AC
  Filled 2020-04-10: qty 5

## 2020-04-10 MED ORDER — HEPARIN SOD (PORK) LOCK FLUSH 100 UNIT/ML IV SOLN
500.0000 [IU] | Freq: Once | INTRAVENOUS | Status: AC | PRN
  Administered 2020-04-10: 500 [IU]
  Filled 2020-04-10: qty 5

## 2020-04-10 MED ORDER — SODIUM CHLORIDE 0.9 % IV SOLN
600.0000 mg/m2 | Freq: Once | INTRAVENOUS | Status: AC
  Administered 2020-04-10: 1060 mg via INTRAVENOUS
  Filled 2020-04-10: qty 53

## 2020-04-10 NOTE — Assessment & Plan Note (Signed)
08/01/2019:Palpable right breast mass for 1 month. Mammogram revealed a 5 cm mass 8:30 position, 3 cm intramammary lymph node at 10 o'clock position and 2 enlarged right axillary lymph nodes. Biopsy revealed grade 1 IDC ER 90%, PR 10%, Ki-67 10%, HER-2 negative. Intramammary node and axillary lymph node both positive with similar prognostic profile  Breast MRI 08/15/2019: Biopsy-proven malignancy lower outer quadrant right breast, non-mass enhancement spanning 8.7 cm with probable invasion of underlying pectoral muscle. Biopsy-proven metastatic lymph node in the right axillary tail. Mild asymmetric right infraclavicular adenopathy.Biopsy positive for invasive ductal carcinoma grade 1  CT CAP 08/24/2019: Right axillary and right retropectoral adenopathy compatible with nodal metastases. Indistinct sclerotic 1.2 cm left acetabular lesion. No other distant metastases MammaPrint: Luminal type A, low risk, no benefit to chemotherapy Bone scan: Negative for metastatic disease  TreatmentSummary: 1.Neoadjuvant antiestrogen therapy with anastrozole 1 mg daily started 08/08/2019 2.bilateral mastectomies1/13/2022: Right mastectomy with ALND: IDC 6.5 cm, 7/8 lymph nodes positive;left mastectomy: Benign ER 90%, PR 10%, Ki-67 10%, HER2 negative 3.given the fact that the patient had 7 positive lymph nodes, the standard of care would be to offer adjuvant chemotherapy. I recommended dose dense Adriamycin and Cytoxan followed by Taxol. 4.Adjradiation 5.Followed by adjuvant antiestrogen therapy with the CDK 4 and 6 inhibitor ----------------------------------------------------------------------------------------------------------------------------- Current Treatment: Cycle 2 Dose dense Adriamycin and Cytoxan  Chemo toxicities: Tolerating chemo extremely well. Denies any nausea or vomiting.  Denies any change in appetite or taste.  Return to clinic in 2 weeks for cycle 3

## 2020-04-10 NOTE — Patient Instructions (Signed)
[facility] Discharge Instructions for Patients Receiving Chemotherapy  Today you received the following chemotherapy agents Doxorubicin (ADRIAMYCIN) & Cyclophosphamide (CYTOXAN).  To help prevent nausea and vomiting after your treatment, we encourage you to take your nausea medication as prescribed.   If you develop nausea and vomiting that is not controlled by your nausea medication, call the clinic.   BELOW ARE SYMPTOMS THAT SHOULD BE REPORTED IMMEDIATELY:  *FEVER GREATER THAN 100.5 F  *CHILLS WITH OR WITHOUT FEVER  NAUSEA AND VOMITING THAT IS NOT CONTROLLED WITH YOUR NAUSEA MEDICATION  *UNUSUAL SHORTNESS OF BREATH  *UNUSUAL BRUISING OR BLEEDING  TENDERNESS IN MOUTH AND THROAT WITH OR WITHOUT PRESENCE OF ULCERS  *URINARY PROBLEMS  *BOWEL PROBLEMS  UNUSUAL RASH Items with * indicate a potential emergency and should be followed up as soon as possible.  Feel free to call the clinic should you have any questions or concerns. The clinic phone number is [phone].  Please show the [facility] at check-in to the Emergency Department and triage nurse.

## 2020-04-10 NOTE — Research (Signed)
[place] [id number]: REFRACTORY NAUSEA  04/10/20    12:45PM  [facility] COLLECTION: Four day home record and [facility] Antiemesis tool were given to patient upon arrival. Patient completed 4 day home record and [facility] Antiemesis tool. This clinical research coordinator verified completion and patient was thanked for her time.   Carol Ada, RT(R)(T) Clinical Research Coordinator

## 2020-04-10 NOTE — Patient Instructions (Signed)

## 2020-04-11 ENCOUNTER — Encounter: Payer: Self-pay | Admitting: Rehabilitation

## 2020-04-11 ENCOUNTER — Ambulatory Visit: Payer: Commercial Managed Care - PPO | Admitting: Rehabilitation

## 2020-04-11 DIAGNOSIS — M25611 Stiffness of right shoulder, not elsewhere classified: Secondary | ICD-10-CM

## 2020-04-11 DIAGNOSIS — M25612 Stiffness of left shoulder, not elsewhere classified: Secondary | ICD-10-CM

## 2020-04-11 DIAGNOSIS — Z483 Aftercare following surgery for neoplasm: Secondary | ICD-10-CM

## 2020-04-11 DIAGNOSIS — Z9013 Acquired absence of bilateral breasts and nipples: Secondary | ICD-10-CM

## 2020-04-11 NOTE — Therapy (Signed)
[name], [place], [address] Phone: [phone]   Fax:  [phone]  Physical Therapy Treatment  Patient Details  Name: [name] MRN: [id number] Date of Birth: 04-15-1955 Referring Provider (PT): Dr. [name]   Encounter Date: 04/11/2020   PT End of Session - 04/11/20 0853    Visit Number 3    Number of Visits 6    Date for PT Re-Evaluation 05/09/20    PT Start Time 0800    PT Stop Time 0850    PT Time Calculation (min) 50 min    Activity Tolerance Patient tolerated treatment well    Behavior During Therapy [facility] for tasks assessed/performed           Past Medical History:  Diagnosis Date  . Complication of anesthesia   . Dyslipidemia   . Hypothyroidism   . Pneumonia   . PONV (postoperative nausea and vomiting)   . Thyroid cancer [facility])     Past Surgical History:  Procedure Laterality Date  . MASTECTOMY WITH AXILLARY LYMPH NODE DISSECTION Bilateral 02/28/2020   Procedure: BILATERAL MASTECTOMY WITH RIGHT SENTINEL LYMPH NODE MAPPING AND RIGHT TARGETED RADIOACTIVE SEED GUIDED  LYMPH NODE DISSECTION;  Surgeon: Jovita Kussmaul, MD;  Location: [facility];  Service: General;  Laterality: Bilateral;  PEC BLOCK, RNFA ([place])  . PORTACATH PLACEMENT Left 03/21/2020   Procedure: PORT PLACEMENT WITH ULTRASOUND;  Surgeon: Jovita Kussmaul, MD;  Location: [facility];  Service: General;  Laterality: Left;  . THYROIDECTOMY      There were no vitals filed for this visit.   Subjective Assessment - 04/11/20 0804    Subjective I did my second round of chemo.  I am feeling great.    Pertinent History bilateral mastectomy on 02/28/20 with Dr. [name] Rt IDC 6/7 nodes positive and Lt NED with 1 node removed.  Started chemotherapy with Paclitaxel this week and will have radiation with unknown start date. Port on the left side.    Patient Stated Goals do I need to do anything    Currently in Pain?  No/denies              [facility] PT Assessment - 04/11/20 0001      AROM   Right Shoulder Flexion 140 Degrees    Right Shoulder ABduction 145 Degrees   just pull   Left Shoulder Flexion 142 Degrees   axillary soreness   Left Shoulder ABduction 130 Degrees   just tight                        [facility] Adult PT Treatment/Exercise - 04/11/20 0001      Shoulder Exercises: Pulleys   Flexion 2 minutes    Scaption 2 minutes      Shoulder Exercises: Therapy Ball   Flexion 5 reps;Both    ABduction Both;5 reps    ABduction Limitations with vcs      Manual Therapy   Manual Therapy Soft tissue mobilization    Soft tissue mobilization to bil pectoralis    Passive ROM to bil shoulders into flexion, abduction, ER at various angles prolonged holds into ER for pectoralis stretching                       PT Long Term Goals - 03/28/20 0949      PT LONG TERM GOAL #1   Title Pt will be educated on lymphedema  surveillance and risk reduction    Time 1    Period Weeks    Status Achieved      PT LONG TERM GOAL #2   Title Pt will improve bil shoulder AROM to at least 150 flexion and abduction to improve AROM    Baseline Rt: flex: 130 Abd: 122,     Lt: flex: 130, Abd: 113    Time 6    Period Weeks    Status New      PT LONG TERM GOAL #3   Title Pt will be educated on strength ABC and return to light weights    Time 6    Period Weeks    Status New                 Plan - 04/11/20 1700    Clinical Impression Statement Pt continues with bil pectoralis tightness limiting AROM/PROM but is not experiencing limitations in terms of ADL movement and reaching.  Pt is tolerating chemotherapy well so far and will continue PT at this time.    PT Frequency 1x / week    PT Duration 6 weeks    PT Treatment/Interventions ADLs/Self Care Home Management;Passive range of motion;Patient/family education;Manual lymph drainage;Manual techniques;Therapeutic exercise    PT Next  Visit Plan recheck AROM, pulleys AAROM/PROM, PROM bil, try supine scap    PT Home Exercise Plan Access Code: [email or web address]    Consulted and Agree with Plan of Care Patient           Patient will benefit from skilled therapeutic intervention in order to improve the following deficits and impairments:     Visit Diagnosis: Aftercare following surgery for neoplasm  H/O bilateral mastectomy  Shoulder stiffness, left  Stiffness of right shoulder, not elsewhere classified     Problem List Patient Active Problem List   Diagnosis Date Noted  . Cancer of right female breast ([place]) 02/28/2020  . Malignant neoplasm of lower-outer quadrant of right breast of female, estrogen receptor positive ([place]) 08/08/2019    [name] 04/11/2020, 8:55 AM  [place], [place], [address] Phone: [phone]   Fax:  [phone]  Name: [name] MRN: [id number] Date of Birth: 05/17/1955

## 2020-04-12 ENCOUNTER — Other Ambulatory Visit: Payer: Self-pay

## 2020-04-12 ENCOUNTER — Inpatient Hospital Stay: Payer: Commercial Managed Care - PPO

## 2020-04-12 VITALS — BP 136/73 | HR 72 | Temp 97.5°F | Resp 18

## 2020-04-12 DIAGNOSIS — C50511 Malignant neoplasm of lower-outer quadrant of right female breast: Secondary | ICD-10-CM

## 2020-04-12 DIAGNOSIS — Z5111 Encounter for antineoplastic chemotherapy: Secondary | ICD-10-CM | POA: Diagnosis not present

## 2020-04-12 MED ORDER — PEGFILGRASTIM-BMEZ 6 MG/0.6ML ~~LOC~~ SOSY
6.0000 mg | PREFILLED_SYRINGE | Freq: Once | SUBCUTANEOUS | Status: AC
Start: 2020-04-12 — End: 2020-04-12
  Administered 2020-04-12: 6 mg via SUBCUTANEOUS

## 2020-04-12 NOTE — Patient Instructions (Signed)
Pegfilgrastim injection What is this medicine? PEGFILGRASTIM (PEG fil gra stim) is a long-acting granulocyte colony-stimulating factor that stimulates the growth of neutrophils, a type of white blood cell important in the body's fight against infection. It is used to reduce the incidence of fever and infection in patients with certain types of cancer who are receiving chemotherapy that affects the bone marrow, and to increase survival after being exposed to high doses of radiation. This medicine may be used for other purposes; ask your health care provider or pharmacist if you have questions. COMMON BRAND NAME(S): Fulphila, Neulasta, Nyvepria, UDENYCA, Ziextenzo What should I tell my health care provider before I take this medicine? They need to know if you have any of these conditions:  kidney disease  latex allergy  ongoing radiation therapy  sickle cell disease  skin reactions to acrylic adhesives (On-Body Injector only)  an unusual or allergic reaction to pegfilgrastim, filgrastim, other medicines, foods, dyes, or preservatives  pregnant or trying to get pregnant  breast-feeding How should I use this medicine? This medicine is for injection under the skin. If you get this medicine at home, you will be taught how to prepare and give the pre-filled syringe or how to use the On-body Injector. Refer to the patient Instructions for Use for detailed instructions. Use exactly as directed. Tell your healthcare provider immediately if you suspect that the On-body Injector may not have performed as intended or if you suspect the use of the On-body Injector resulted in a missed or partial dose. It is important that you put your used needles and syringes in a special sharps container. Do not put them in a trash can. If you do not have a sharps container, call your pharmacist or healthcare provider to get one. Talk to your pediatrician regarding the use of this medicine in children. While this drug  may be prescribed for selected conditions, precautions do apply. Overdosage: If you think you have taken too much of this medicine contact a poison control center or emergency room at once. NOTE: This medicine is only for you. Do not share this medicine with others. What if I miss a dose? It is important not to miss your dose. Call your doctor or health care professional if you miss your dose. If you miss a dose due to an On-body Injector failure or leakage, a new dose should be administered as soon as possible using a single prefilled syringe for manual use. What may interact with this medicine? Interactions have not been studied. This list may not describe all possible interactions. Give your health care provider a list of all the medicines, herbs, non-prescription drugs, or dietary supplements you use. Also tell them if you smoke, drink alcohol, or use illegal drugs. Some items may interact with your medicine. What should I watch for while using this medicine? Your condition will be monitored carefully while you are receiving this medicine. You may need blood work done while you are taking this medicine. Talk to your health care provider about your risk of cancer. You may be more at risk for certain types of cancer if you take this medicine. If you are going to need a MRI, CT scan, or other procedure, tell your doctor that you are using this medicine (On-Body Injector only). What side effects may I notice from receiving this medicine? Side effects that you should report to your doctor or health care professional as soon as possible:  allergic reactions (skin rash, itching or hives, swelling of   the face, lips, or tongue)  back pain  dizziness  fever  pain, redness, or irritation at site where injected  pinpoint red spots on the skin  red or dark-brown urine  shortness of breath or breathing problems  stomach or side pain, or pain at the shoulder  swelling  tiredness  trouble  passing urine or change in the amount of urine  unusual bruising or bleeding Side effects that usually do not require medical attention (report to your doctor or health care professional if they continue or are bothersome):  bone pain  muscle pain This list may not describe all possible side effects. Call your doctor for medical advice about side effects. You may report side effects to FDA at 1-800-FDA-1088. Where should I keep my medicine? Keep out of the reach of children. If you are using this medicine at home, you will be instructed on how to store it. Throw away any unused medicine after the expiration date on the label. NOTE: This sheet is a summary. It may not cover all possible information. If you have questions about this medicine, talk to your doctor, pharmacist, or health care provider.  2021 Elsevier/Gold Standard (2019-02-23 13:20:51)  

## 2020-04-18 ENCOUNTER — Encounter: Payer: Self-pay | Admitting: Rehabilitation

## 2020-04-18 ENCOUNTER — Other Ambulatory Visit: Payer: Self-pay

## 2020-04-18 ENCOUNTER — Ambulatory Visit: Payer: Commercial Managed Care - PPO | Attending: General Surgery | Admitting: Rehabilitation

## 2020-04-18 DIAGNOSIS — M25611 Stiffness of right shoulder, not elsewhere classified: Secondary | ICD-10-CM

## 2020-04-18 DIAGNOSIS — M25612 Stiffness of left shoulder, not elsewhere classified: Secondary | ICD-10-CM

## 2020-04-18 DIAGNOSIS — Z9013 Acquired absence of bilateral breasts and nipples: Secondary | ICD-10-CM | POA: Diagnosis present

## 2020-04-18 DIAGNOSIS — Z483 Aftercare following surgery for neoplasm: Secondary | ICD-10-CM | POA: Diagnosis present

## 2020-04-18 NOTE — Therapy (Signed)
[facility] [place], [place], [address] Phone: [phone]   Fax:  [phone]  Physical Therapy Treatment  Patient Details  Name: [name] MRN: [id number] Date of Birth: [birth year] Referring Provider (PT): Dr. [name]   Encounter Date: 04/18/2020   PT End of Session - 04/18/20 1005    Visit Number 4    Number of Visits 6    Date for PT Re-Evaluation 05/09/20    PT Start Time 0804    PT Stop Time 0854    PT Time Calculation (min) 50 min    Activity Tolerance Patient tolerated treatment well    Behavior During Therapy [facility] for tasks assessed/performed           Past Medical History:  Diagnosis Date  . Complication of anesthesia   . Dyslipidemia   . Hypothyroidism   . Pneumonia   . PONV (postoperative nausea and vomiting)   . Thyroid cancer [facility])     Past Surgical History:  Procedure Laterality Date  . MASTECTOMY WITH AXILLARY LYMPH NODE DISSECTION Bilateral 02/28/2020   Procedure: BILATERAL MASTECTOMY WITH RIGHT SENTINEL LYMPH NODE MAPPING AND RIGHT TARGETED RADIOACTIVE SEED GUIDED  LYMPH NODE DISSECTION;  Surgeon: Jovita Kussmaul, MD;  Location: [facility];  Service: General;  Laterality: Bilateral;  PEC BLOCK, RNFA ([place])  . PORTACATH PLACEMENT Left 03/21/2020   Procedure: PORT PLACEMENT WITH ULTRASOUND;  Surgeon: Jovita Kussmaul, MD;  Location: [facility];  Service: General;  Laterality: Left;  . THYROIDECTOMY      There were no vitals filed for this visit.   Subjective Assessment - 04/18/20 0804    Subjective I am feeling much better this week.  I think using the pillow in my armpit really helped the left side    Pertinent History bilateral mastectomy on 02/28/20 with Dr. [name] Rt IDC 6/7 nodes positive and Lt NED with 1 node removed.  Started chemotherapy with Paclitaxel this week and will have radiation with unknown start date. Port on the left side.    Currently in Pain?  No/denies              [facility] PT Assessment - 04/18/20 0001      AROM   Right Shoulder Flexion 145 Degrees    Right Shoulder ABduction 160 Degrees    Left Shoulder Flexion 145 Degrees    Left Shoulder ABduction 158 Degrees                         [facility] Adult PT Treatment/Exercise - 04/18/20 0001      Shoulder Exercises: Pulleys   Flexion 2 minutes    Flexion Limitations with cueing    Scaption 2 minutes    Scaption Limitations with cueing      Shoulder Exercises: Therapy Ball   Flexion 5 reps;Both      Manual Therapy   Soft tissue mobilization to bil pectoralis    Passive ROM to bil shoulders into flexion, abduction, ER at various angles prolonged holds into ER for pectoralis stretching                       PT Long Term Goals - 03/28/20 0949      PT LONG TERM GOAL #1   Title Pt will be educated on lymphedema surveillance and risk reduction    Time 1    Period Weeks    Status Achieved  PT LONG TERM GOAL #2   Title Pt will improve bil shoulder AROM to at least 150 flexion and abduction to improve AROM    Baseline Rt: flex: 130 Abd: 122,     Lt: flex: 130, Abd: 113    Time 6    Period Weeks    Status New      PT LONG TERM GOAL #3   Title Pt will be educated on strength ABC and return to light weights    Time 6    Period Weeks    Status New                 Plan - 04/18/20 1006    Clinical Impression Statement Pt with improved pectoralis status today bilaterally allowing more AROM/PROm without tightness/attempted spasm.  Pt will benefit from adding some strengthening next session to get ready for ind.    PT Frequency 1x / week    PT Duration 6 weeks    PT Treatment/Interventions ADLs/Self Care Home Management;Passive range of motion;Patient/family education;Manual lymph drainage;Manual techniques;Therapeutic exercise    PT Next Visit Plan pulleys AAROM/PROM, PROM bil, try supine scap or strength ABC    PT Home Exercise  Plan Access Code: [email or web address]    Consulted and Agree with Plan of Care Patient           Patient will benefit from skilled therapeutic intervention in order to improve the following deficits and impairments:     Visit Diagnosis: Aftercare following surgery for neoplasm  H/O bilateral mastectomy  Shoulder stiffness, left  Stiffness of right shoulder, not elsewhere classified     Problem List Patient Active Problem List   Diagnosis Date Noted  . Cancer of right female breast ([place]) 02/28/2020  . Malignant neoplasm of lower-outer quadrant of right breast of female, estrogen receptor positive ([place]) 08/08/2019    [name] 04/18/2020, 10:07 AM  [place], [place], [address] Phone: [phone]   Fax:  [phone]  Name: MAYLIN FREEBURG MRN: [id number] Date of Birth: April 03, 1955

## 2020-04-22 NOTE — Progress Notes (Signed)
Patient Care Team: Deland Pretty, MD as PCP - General (Internal Medicine) Mauro Kaufmann, RN as Medical Oncologist Rockwell Germany, RN as Oncology Nurse Navigator  DIAGNOSIS:    ICD-10-CM   1. Malignant neoplasm of lower-outer quadrant of right breast of female, estrogen receptor positive ([place])  C50.511    Z17.0     SUMMARY OF ONCOLOGIC HISTORY: Oncology History  Malignant neoplasm of lower-outer quadrant of right breast of female, estrogen receptor positive ([place])  08/01/2019 Initial Diagnosis   Palpable right breast mass for 1 month.  Mammogram revealed a 5 cm mass 8:30 position, 3 cm intramammary lymph node at 10 o'clock position and 2 enlarged right axillary lymph nodes.  Biopsy revealed grade 1 IDC ER 90%, PR 10%, Ki-67 10%, HER-2 negative.  Intramammary node and axillary lymph node both positive with similar prognostic profile   08/08/2019 -  Neo-Adjuvant Anti-estrogen oral therapy   Anastrozole while deciding treatment plan    02/28/2020 Surgery   Bilateral mastectomies [name]):  Right breast: IDC, 6.5cm, metastatic carcinoma in 6/7 right axillary lymph nodes, 1.5cm, 1.5cm, and 0.3cm, and one intramammary lymph node, 2.5cm Left breast: no evidence of malignancy in the breast or 1 left axillary lymph node.   03/26/2020 -  Chemotherapy    Patient is on Treatment Plan: BREAST ADJUVANT DOSE DENSE AC Q14D / PACLITAXEL Q7D        CHIEF COMPLIANT: Cycle 3 Day 1 Adrimycin and Cytoxan  INTERVAL HISTORY: [name] is a 65 y.o. with above-mentioned history of right breast cancertreated withneoadjuvant antiestrogen therapy,bilateral mastectomies, and who is currently on adjuvant chemotherapy with dose dense Adriamcyin and Cytoxan. She presents today for a toxicity check and treatment.    ALLERGIES:  has No Known Allergies.  MEDICATIONS:  Current Outpatient Medications  Medication Sig Dispense Refill  . calcium carbonate (OS-CAL) 1250 (500 Ca) MG chewable tablet Chew 1  tablet by mouth daily.    . cholecalciferol (VITAMIN D3) 25 MCG (1000 UNIT) tablet Take 1,000 Units by mouth daily.    Marland Kitchen dexamethasone (DECADRON) 4 MG tablet Take 2 tablets (8 mg total) by mouth daily. Take daily for 3 days after chemo. Take with food. 30 tablet 1  . levothyroxine (SYNTHROID) 125 MCG tablet Take 1 tablet (125 mcg total) by mouth daily before breakfast.    . lidocaine-prilocaine (EMLA) cream Apply to affected area once 30 g 3  . Multiple Vitamins-Minerals (MULTIVITAMIN WITH MINERALS) tablet Take 1 tablet by mouth daily.    . ondansetron (ZOFRAN) 8 MG tablet Take 1 tablet (8 mg total) by mouth 2 (two) times daily as needed. Start on the third day after chemotherapy. 30 tablet 1  . prochlorperazine (COMPAZINE) 10 MG tablet Take 1 tablet (10 mg total) by mouth every 6 (six) hours as needed (Nausea or vomiting). 30 tablet 1  . rosuvastatin (CRESTOR) 10 MG tablet Take 1 tablet (10 mg total) by mouth daily.     No current facility-administered medications for this visit.    PHYSICAL EXAMINATION: ECOG PERFORMANCE STATUS: 1 - Symptomatic but completely ambulatory  Vitals:   04/23/20 0811  BP: 127/67  Pulse: 85  Resp: 17  Temp: 97.7 F (36.5 C)  SpO2: 100%   Filed Weights   04/23/20 0811  Weight: 148 lb (67.1 kg)    LABORATORY DATA:  I have reviewed the data as listed CMP Latest Ref Rng & Units 04/10/2020 04/02/2020 03/26/2020  Glucose 70 - 99 mg/dL 137(H) 121(H) 137(H)  BUN 8 -  23 mg/dL '13 10 11  ' Creatinine 0.44 - 1.00 mg/dL 0.68 0.73 0.75  Sodium 135 - 145 mmol/L 140 137 140  Potassium 3.5 - 5.1 mmol/L 3.5 3.6 3.6  Chloride 98 - 111 mmol/L 107 104 106  CO2 22 - 32 mmol/L '24 27 25  ' Calcium 8.9 - 10.3 mg/dL 9.1 9.0 9.0  Total Protein 6.5 - 8.1 g/dL 7.2 7.1 7.4  Total Bilirubin 0.3 - 1.2 mg/dL 0.3 0.5 0.5  Alkaline Phos 38 - 126 U/L 112 91 72  AST 15 - 41 U/L 15 10(L) 14(L)  ALT 0 - 44 U/L '18 14 15    ' Lab Results  Component Value Date   WBC 29.2 (H) 04/23/2020    HGB 10.4 (L) 04/23/2020   HCT 30.4 (L) 04/23/2020   MCV 87.9 04/23/2020   PLT 133 (L) 04/23/2020   NEUTROABS PENDING 04/23/2020    ASSESSMENT & PLAN:  Malignant neoplasm of lower-outer quadrant of right breast of female, estrogen receptor positive ([place]) 08/01/2019:Palpable right breast mass for 1 month. Mammogram revealed a 5 cm mass 8:30 position, 3 cm intramammary lymph node at 10 o'clock position and 2 enlarged right axillary lymph nodes. Biopsy revealed grade 1 IDC ER 90%, PR 10%, Ki-67 10%, HER-2 negative. Intramammary node and axillary lymph node both positive with similar prognostic profile  Breast MRI 08/15/2019: Biopsy-proven malignancy lower outer quadrant right breast, non-mass enhancement spanning 8.7 cm with probable invasion of underlying pectoral muscle. Biopsy-proven metastatic lymph node in the right axillary tail. Mild asymmetric right infraclavicular adenopathy.Biopsy positive for invasive ductal carcinoma grade 1  CT CAP 08/24/2019: Right axillary and right retropectoral adenopathy compatible with nodal metastases. Indistinct sclerotic 1.2 cm left acetabular lesion. No other distant metastases MammaPrint: Luminal type A, low risk, no benefit to chemotherapy Bone scan: Negative for metastatic disease  TreatmentSummary: 1.Neoadjuvant antiestrogen therapy with anastrozole 1 mg daily started 08/08/2019 2.bilateral mastectomies1/13/2022: Right mastectomy with ALND: IDC 6.5 cm, 7/8 lymph nodes positive;left mastectomy: Benign ER 90%, PR 10%, Ki-67 10%, HER2 negative 3.given the fact that the patient had 7 positive lymph nodes, the standard of care would be to offer adjuvant chemotherapy. I recommended dose dense Adriamycin and Cytoxan followed by Taxol. 4.Adjradiation 5.Followed by adjuvant antiestrogen therapy with the CDK 4 and 6  inhibitor ----------------------------------------------------------------------------------------------------------------------------- Current Treatment: Cycle 3Dose dense Adriamycin and Cytoxan  Chemo toxicities: Tolerating chemo extremely well. Denies any nausea or vomiting. Denies any change in appetite or taste. Leukocytosis: Secondary to Neulasta injection.  Return to clinic in 2 weeks for cycle 4  No orders of the defined types were placed in this encounter.  The patient has a good understanding of the overall plan. she agrees with it. she will call with any problems that may develop before the next visit here.  Total time spent: 30 mins including face to face time and time spent for planning, charting and coordination of care  Rulon Eisenmenger, MD, MPH 04/23/2020  I, Cloyde Reams Dorshimer, am acting as scribe for Dr. [name].  I have reviewed the above documentation for accuracy and completeness, and I agree with the above.

## 2020-04-23 ENCOUNTER — Inpatient Hospital Stay: Payer: Commercial Managed Care - PPO | Attending: Hematology and Oncology

## 2020-04-23 ENCOUNTER — Other Ambulatory Visit: Payer: Self-pay

## 2020-04-23 ENCOUNTER — Inpatient Hospital Stay: Payer: Commercial Managed Care - PPO

## 2020-04-23 ENCOUNTER — Inpatient Hospital Stay (HOSPITAL_BASED_OUTPATIENT_CLINIC_OR_DEPARTMENT_OTHER): Payer: Commercial Managed Care - PPO | Admitting: Hematology and Oncology

## 2020-04-23 DIAGNOSIS — C50511 Malignant neoplasm of lower-outer quadrant of right female breast: Secondary | ICD-10-CM | POA: Diagnosis present

## 2020-04-23 DIAGNOSIS — Z170 Estrogen receptor positive status [ER+]: Secondary | ICD-10-CM | POA: Diagnosis not present

## 2020-04-23 DIAGNOSIS — D72829 Elevated white blood cell count, unspecified: Secondary | ICD-10-CM | POA: Insufficient documentation

## 2020-04-23 DIAGNOSIS — C773 Secondary and unspecified malignant neoplasm of axilla and upper limb lymph nodes: Secondary | ICD-10-CM | POA: Insufficient documentation

## 2020-04-23 DIAGNOSIS — Z5189 Encounter for other specified aftercare: Secondary | ICD-10-CM | POA: Diagnosis not present

## 2020-04-23 DIAGNOSIS — Z5111 Encounter for antineoplastic chemotherapy: Secondary | ICD-10-CM | POA: Insufficient documentation

## 2020-04-23 DIAGNOSIS — Z95828 Presence of other vascular implants and grafts: Secondary | ICD-10-CM

## 2020-04-23 LAB — CBC WITH DIFFERENTIAL (CANCER CENTER ONLY)
Abs Immature Granulocytes: 5.57 10*3/uL — ABNORMAL HIGH (ref 0.00–0.07)
Basophils Absolute: 0.1 10*3/uL (ref 0.0–0.1)
Basophils Relative: 0 %
Eosinophils Absolute: 0 10*3/uL (ref 0.0–0.5)
Eosinophils Relative: 0 %
HCT: 30.4 % — ABNORMAL LOW (ref 36.0–46.0)
Hemoglobin: 10.4 g/dL — ABNORMAL LOW (ref 12.0–15.0)
Immature Granulocytes: 19 %
Lymphocytes Relative: 7 %
Lymphs Abs: 1.9 10*3/uL (ref 0.7–4.0)
MCH: 30.1 pg (ref 26.0–34.0)
MCHC: 34.2 g/dL (ref 30.0–36.0)
MCV: 87.9 fL (ref 80.0–100.0)
Monocytes Absolute: 1.8 10*3/uL — ABNORMAL HIGH (ref 0.1–1.0)
Monocytes Relative: 6 %
Neutro Abs: 19.8 10*3/uL — ABNORMAL HIGH (ref 1.7–7.7)
Neutrophils Relative %: 68 %
Platelet Count: 133 10*3/uL — ABNORMAL LOW (ref 150–400)
RBC: 3.46 MIL/uL — ABNORMAL LOW (ref 3.87–5.11)
RDW: 13.6 % (ref 11.5–15.5)
WBC Count: 29.2 10*3/uL — ABNORMAL HIGH (ref 4.0–10.5)
nRBC: 0.4 % — ABNORMAL HIGH (ref 0.0–0.2)

## 2020-04-23 LAB — CMP (CANCER CENTER ONLY)
ALT: 17 U/L (ref 0–44)
AST: 15 U/L (ref 15–41)
Albumin: 4 g/dL (ref 3.5–5.0)
Alkaline Phosphatase: 121 U/L (ref 38–126)
Anion gap: 10 (ref 5–15)
BUN: 12 mg/dL (ref 8–23)
CO2: 26 mmol/L (ref 22–32)
Calcium: 8.9 mg/dL (ref 8.9–10.3)
Chloride: 107 mmol/L (ref 98–111)
Creatinine: 0.74 mg/dL (ref 0.44–1.00)
GFR, Estimated: >60 mL/min (ref >60)
Glucose, Bld: 122 mg/dL — ABNORMAL HIGH (ref 70–99)
Potassium: 3.7 mmol/L (ref 3.5–5.1)
Sodium: 143 mmol/L (ref 135–145)
Total Bilirubin: 0.3 mg/dL (ref 0.3–1.2)
Total Protein: 6.7 g/dL (ref 6.5–8.1)

## 2020-04-23 MED ORDER — SODIUM CHLORIDE 0.9 % IV SOLN
150.0000 mg | Freq: Once | INTRAVENOUS | Status: AC
  Administered 2020-04-23: 150 mg via INTRAVENOUS
  Filled 2020-04-23: qty 150

## 2020-04-23 MED ORDER — DOXORUBICIN HCL CHEMO IV INJECTION 2 MG/ML
60.0000 mg/m2 | Freq: Once | INTRAVENOUS | Status: AC
  Administered 2020-04-23: 106 mg via INTRAVENOUS
  Filled 2020-04-23: qty 53

## 2020-04-23 MED ORDER — HEPARIN SOD (PORK) LOCK FLUSH 100 UNIT/ML IV SOLN
500.0000 [IU] | Freq: Once | INTRAVENOUS | Status: AC | PRN
Start: 2020-04-23 — End: 2020-04-23
  Administered 2020-04-23: 500 [IU]
  Filled 2020-04-23: qty 5

## 2020-04-23 MED ORDER — SODIUM CHLORIDE 0.9% FLUSH
10.0000 mL | INTRAVENOUS | Status: DC | PRN
  Administered 2020-04-23: 10 mL via INTRAVENOUS
  Filled 2020-04-23: qty 10

## 2020-04-23 MED ORDER — SODIUM CHLORIDE 0.9 % IV SOLN
600.0000 mg/m2 | Freq: Once | INTRAVENOUS | Status: AC
  Administered 2020-04-23: 1060 mg via INTRAVENOUS
  Filled 2020-04-23: qty 53

## 2020-04-23 MED ORDER — SODIUM CHLORIDE 0.9 % IV SOLN
Freq: Once | INTRAVENOUS | Status: AC
  Filled 2020-04-23: qty 250

## 2020-04-23 MED ORDER — SODIUM CHLORIDE 0.9 % IV SOLN
10.0000 mg | Freq: Once | INTRAVENOUS | Status: AC
  Administered 2020-04-23: 10 mg via INTRAVENOUS
  Filled 2020-04-23: qty 10

## 2020-04-23 MED ORDER — SODIUM CHLORIDE 0.9% FLUSH
10.0000 mL | INTRAVENOUS | Status: DC | PRN
  Administered 2020-04-23: 10 mL
  Filled 2020-04-23: qty 10

## 2020-04-23 MED ORDER — PALONOSETRON HCL INJECTION 0.25 MG/5ML
INTRAVENOUS | Status: AC
  Filled 2020-04-23: qty 5

## 2020-04-23 MED ORDER — PALONOSETRON HCL INJECTION 0.25 MG/5ML
0.2500 mg | Freq: Once | INTRAVENOUS | Status: AC
  Administered 2020-04-23: 0.25 mg via INTRAVENOUS

## 2020-04-23 NOTE — Patient Instructions (Signed)
[facility] Discharge Instructions for Patients Receiving Chemotherapy  Today you received the following chemotherapy agents: doxorubicin/cyclophosphamide.  To help prevent nausea and vomiting after your treatment, we encourage you to take your nausea medication as directed.   If you develop nausea and vomiting that is not controlled by your nausea medication, call the clinic.   BELOW ARE SYMPTOMS THAT SHOULD BE REPORTED IMMEDIATELY:  *FEVER GREATER THAN 100.5 F  *CHILLS WITH OR WITHOUT FEVER  NAUSEA AND VOMITING THAT IS NOT CONTROLLED WITH YOUR NAUSEA MEDICATION  *UNUSUAL SHORTNESS OF BREATH  *UNUSUAL BRUISING OR BLEEDING  TENDERNESS IN MOUTH AND THROAT WITH OR WITHOUT PRESENCE OF ULCERS  *URINARY PROBLEMS  *BOWEL PROBLEMS  UNUSUAL RASH Items with * indicate a potential emergency and should be followed up as soon as possible.  Feel free to call the clinic should you have any questions or concerns. The clinic phone number is [phone].  Please show the CHEMO ALERT CARD at check-in to the Emergency Department and triage nurse.   

## 2020-04-23 NOTE — Assessment & Plan Note (Signed)
08/01/2019:Palpable right breast mass for 1 month. Mammogram revealed a 5 cm mass 8:30 position, 3 cm intramammary lymph node at 10 o'clock position and 2 enlarged right axillary lymph nodes. Biopsy revealed grade 1 IDC ER 90%, PR 10%, Ki-67 10%, HER-2 negative. Intramammary node and axillary lymph node both positive with similar prognostic profile  Breast MRI 08/15/2019: Biopsy-proven malignancy lower outer quadrant right breast, non-mass enhancement spanning 8.7 cm with probable invasion of underlying pectoral muscle. Biopsy-proven metastatic lymph node in the right axillary tail. Mild asymmetric right infraclavicular adenopathy.Biopsy positive for invasive ductal carcinoma grade 1  CT CAP 08/24/2019: Right axillary and right retropectoral adenopathy compatible with nodal metastases. Indistinct sclerotic 1.2 cm left acetabular lesion. No other distant metastases MammaPrint: Luminal type A, low risk, no benefit to chemotherapy Bone scan: Negative for metastatic disease  TreatmentSummary: 1.Neoadjuvant antiestrogen therapy with anastrozole 1 mg daily started 08/08/2019 2.bilateral mastectomies1/13/2022: Right mastectomy with ALND: IDC 6.5 cm, 7/8 lymph nodes positive;left mastectomy: Benign ER 90%, PR 10%, Ki-67 10%, HER2 negative 3.given the fact that the patient had 7 positive lymph nodes, the standard of care would be to offer adjuvant chemotherapy. I recommended dose dense Adriamycin and Cytoxan followed by Taxol. 4.Adjradiation 5.Followed by adjuvant antiestrogen therapy with the CDK 4 and 6 inhibitor ----------------------------------------------------------------------------------------------------------------------------- Current Treatment: Cycle 3Dose dense Adriamycin and Cytoxan  Chemo toxicities: Tolerating chemo extremely well. Denies any nausea or vomiting. Denies any change in appetite or taste. Leukocytosis: Secondary to Neulasta injection.  Return to  clinic in 2 weeks for cycle 4

## 2020-04-23 NOTE — Patient Instructions (Signed)
Implanted Port Insertion, Care After This sheet gives you information about how to care for yourself after your procedure. Your health care provider may also give you more specific instructions. If you have problems or questions, contact your health care provider. What can I expect after the procedure? After the procedure, it is common to have:  Discomfort at the port insertion site.  Bruising on the skin over the port. This should improve over 3-4 days. Follow these instructions at home: Port care  After your port is placed, you will get a manufacturer's information card. The card has information about your port. Keep this card with you at all times.  Take care of the port as told by your health care provider. Ask your health care provider if you or a family member can get training for taking care of the port at home. A home health care nurse may also take care of the port.  Make sure to remember what type of port you have. Incision care  Follow instructions from your health care provider about how to take care of your port insertion site. Make sure you: ? Wash your hands with soap and water before and after you change your bandage (dressing). If soap and water are not available, use hand sanitizer. ? Change your dressing as told by your health care provider. ? Leave stitches (sutures), skin glue, or adhesive strips in place. These skin closures may need to stay in place for 2 weeks or longer. If adhesive strip edges start to loosen and curl up, you may trim the loose edges. Do not remove adhesive strips completely unless your health care provider tells you to do that.  Check your port insertion site every day for signs of infection. Check for: ? Redness, swelling, or pain. ? Fluid or blood. ? Warmth. ? Pus or a bad smell.      Activity  Return to your normal activities as told by your health care provider. Ask your health care provider what activities are safe for you.  Do not  lift anything that is heavier than 10 lb (4.5 kg), or the limit that you are told, until your health care provider says that it is safe. General instructions  Take over-the-counter and prescription medicines only as told by your health care provider.  Do not take baths, swim, or use a hot tub until your health care provider approves. Ask your health care provider if you may take showers. You may only be allowed to take sponge baths.  Do not drive for 24 hours if you were given a sedative during your procedure.  Wear a medical alert bracelet in case of an emergency. This will tell any health care providers that you have a port.  Keep all follow-up visits as told by your health care provider. This is important. Contact a health care provider if:  You cannot flush your port with saline as directed, or you cannot draw blood from the port.  You have a fever or chills.  You have redness, swelling, or pain around your port insertion site.  You have fluid or blood coming from your port insertion site.  Your port insertion site feels warm to the touch.  You have pus or a bad smell coming from the port insertion site. Get help right away if:  You have chest pain or shortness of breath.  You have bleeding from your port that you cannot control. Summary  Take care of the port as told by your   health care provider. Keep the manufacturer's information card with you at all times.  Change your dressing as told by your health care provider.  Contact a health care provider if you have a fever or chills or if you have redness, swelling, or pain around your port insertion site.  Keep all follow-up visits as told by your health care provider. This information is not intended to replace advice given to you by your health care provider. Make sure you discuss any questions you have with your health care provider. Document Revised: 08/30/2017 Document Reviewed: 08/30/2017 Elsevier Patient Education   2021 [employer]  

## 2020-04-24 ENCOUNTER — Telehealth: Payer: Self-pay | Admitting: Hematology and Oncology

## 2020-04-24 NOTE — Telephone Encounter (Signed)
Scheduled per 3/9 los. Pt will receive an updated appt calendar per next visit appt notes

## 2020-04-25 ENCOUNTER — Ambulatory Visit: Payer: Commercial Managed Care - PPO | Admitting: Rehabilitation

## 2020-04-25 ENCOUNTER — Encounter: Payer: Self-pay | Admitting: Rehabilitation

## 2020-04-25 ENCOUNTER — Inpatient Hospital Stay: Payer: Commercial Managed Care - PPO

## 2020-04-25 ENCOUNTER — Other Ambulatory Visit: Payer: Self-pay

## 2020-04-25 VITALS — BP 120/67 | HR 69 | Temp 97.5°F | Resp 18

## 2020-04-25 DIAGNOSIS — M25611 Stiffness of right shoulder, not elsewhere classified: Secondary | ICD-10-CM

## 2020-04-25 DIAGNOSIS — Z483 Aftercare following surgery for neoplasm: Secondary | ICD-10-CM

## 2020-04-25 DIAGNOSIS — Z9013 Acquired absence of bilateral breasts and nipples: Secondary | ICD-10-CM

## 2020-04-25 DIAGNOSIS — Z170 Estrogen receptor positive status [ER+]: Secondary | ICD-10-CM

## 2020-04-25 DIAGNOSIS — Z5111 Encounter for antineoplastic chemotherapy: Secondary | ICD-10-CM | POA: Diagnosis not present

## 2020-04-25 DIAGNOSIS — M25612 Stiffness of left shoulder, not elsewhere classified: Secondary | ICD-10-CM

## 2020-04-25 DIAGNOSIS — C50511 Malignant neoplasm of lower-outer quadrant of right female breast: Secondary | ICD-10-CM

## 2020-04-25 MED ORDER — PEGFILGRASTIM-BMEZ 6 MG/0.6ML ~~LOC~~ SOSY
6.0000 mg | PREFILLED_SYRINGE | Freq: Once | SUBCUTANEOUS | Status: AC
  Administered 2020-04-25: 6 mg via SUBCUTANEOUS

## 2020-04-25 MED ORDER — PEGFILGRASTIM-BMEZ 6 MG/0.6ML ~~LOC~~ SOSY
PREFILLED_SYRINGE | SUBCUTANEOUS | Status: AC
  Filled 2020-04-25: qty 0.6

## 2020-04-25 NOTE — Patient Instructions (Signed)
Pegfilgrastim injection What is this medicine? PEGFILGRASTIM (PEG fil gra stim) is a long-acting granulocyte colony-stimulating factor that stimulates the growth of neutrophils, a type of white blood cell important in the body's fight against infection. It is used to reduce the incidence of fever and infection in patients with certain types of cancer who are receiving chemotherapy that affects the bone marrow, and to increase survival after being exposed to high doses of radiation. This medicine may be used for other purposes; ask your health care provider or pharmacist if you have questions. COMMON BRAND NAME(S): Fulphila, Neulasta, Nyvepria, UDENYCA, Ziextenzo What should I tell my health care provider before I take this medicine? They need to know if you have any of these conditions:  kidney disease  latex allergy  ongoing radiation therapy  sickle cell disease  skin reactions to acrylic adhesives (On-Body Injector only)  an unusual or allergic reaction to pegfilgrastim, filgrastim, other medicines, foods, dyes, or preservatives  pregnant or trying to get pregnant  breast-feeding How should I use this medicine? This medicine is for injection under the skin. If you get this medicine at home, you will be taught how to prepare and give the pre-filled syringe or how to use the On-body Injector. Refer to the patient Instructions for Use for detailed instructions. Use exactly as directed. Tell your healthcare provider immediately if you suspect that the On-body Injector may not have performed as intended or if you suspect the use of the On-body Injector resulted in a missed or partial dose. It is important that you put your used needles and syringes in a special sharps container. Do not put them in a trash can. If you do not have a sharps container, call your pharmacist or healthcare provider to get one. Talk to your pediatrician regarding the use of this medicine in children. While this drug  may be prescribed for selected conditions, precautions do apply. Overdosage: If you think you have taken too much of this medicine contact a poison control center or emergency room at once. NOTE: This medicine is only for you. Do not share this medicine with others. What if I miss a dose? It is important not to miss your dose. Call your doctor or health care professional if you miss your dose. If you miss a dose due to an On-body Injector failure or leakage, a new dose should be administered as soon as possible using a single prefilled syringe for manual use. What may interact with this medicine? Interactions have not been studied. This list may not describe all possible interactions. Give your health care provider a list of all the medicines, herbs, non-prescription drugs, or dietary supplements you use. Also tell them if you smoke, drink alcohol, or use illegal drugs. Some items may interact with your medicine. What should I watch for while using this medicine? Your condition will be monitored carefully while you are receiving this medicine. You may need blood work done while you are taking this medicine. Talk to your health care provider about your risk of cancer. You may be more at risk for certain types of cancer if you take this medicine. If you are going to need a MRI, CT scan, or other procedure, tell your doctor that you are using this medicine (On-Body Injector only). What side effects may I notice from receiving this medicine? Side effects that you should report to your doctor or health care professional as soon as possible:  allergic reactions (skin rash, itching or hives, swelling of   the face, lips, or tongue)  back pain  dizziness  fever  pain, redness, or irritation at site where injected  pinpoint red spots on the skin  red or dark-brown urine  shortness of breath or breathing problems  stomach or side pain, or pain at the shoulder  swelling  tiredness  trouble  passing urine or change in the amount of urine  unusual bruising or bleeding Side effects that usually do not require medical attention (report to your doctor or health care professional if they continue or are bothersome):  bone pain  muscle pain This list may not describe all possible side effects. Call your doctor for medical advice about side effects. You may report side effects to FDA at 1-800-FDA-1088. Where should I keep my medicine? Keep out of the reach of children. If you are using this medicine at home, you will be instructed on how to store it. Throw away any unused medicine after the expiration date on the label. NOTE: This sheet is a summary. It may not cover all possible information. If you have questions about this medicine, talk to your doctor, pharmacist, or health care provider.  2021 Elsevier/Gold Standard (2019-02-23 13:20:51)  

## 2020-04-25 NOTE — Therapy (Signed)
[place], [place], [address] Phone: [phone]   Fax:  [phone]  Physical Therapy Treatment  Patient Details  Name: [name] MRN: [id number] Date of Birth: [birth year] Referring Provider (PT): Dr. Marlou Hunt   Encounter Date: 04/25/2020   PT End of Session - 04/25/20 0853    Visit Number 5    Number of Visits 6    Date for PT Re-Evaluation 05/09/20    PT Start Time 0800    PT Stop Time 0853    PT Time Calculation (min) 53 min    Activity Tolerance Patient tolerated treatment well    Behavior During Therapy [name] for tasks assessed/performed           Past Medical History:  Diagnosis Date  . Complication of anesthesia   . Dyslipidemia   . Hypothyroidism   . Pneumonia   . PONV (postoperative nausea and vomiting)   . Thyroid cancer [facility])     Past Surgical History:  Procedure Laterality Date  . MASTECTOMY WITH AXILLARY LYMPH NODE DISSECTION Bilateral 02/28/2020   Procedure: BILATERAL MASTECTOMY WITH RIGHT SENTINEL LYMPH NODE MAPPING AND RIGHT TARGETED RADIOACTIVE SEED GUIDED  LYMPH NODE DISSECTION;  Surgeon: Katelyn Kussmaul, MD;  Location: [facility];  Service: General;  Laterality: Bilateral;  PEC BLOCK, RNFA ([place])  . PORTACATH PLACEMENT Left 03/21/2020   Procedure: PORT PLACEMENT WITH ULTRASOUND;  Surgeon: Katelyn Kussmaul, MD;  Location: [facility];  Service: General;  Laterality: Left;  . THYROIDECTOMY      There were no vitals filed for this visit.   Subjective Assessment - 04/25/20 0800    Subjective I am feeling well.  My hemoglobin is around 10 but should not get much lower    Pertinent History bilateral mastectomy on 02/28/20 with Dr. Marlou Hunt Rt IDC 6/7 nodes positive and Lt NED with 1 node removed.  Started chemotherapy with Paclitaxel this week and will have radiation with unknown start date. Port on the left side.    Currently in Pain? No/denies                              [name] Adult PT Treatment/Exercise - 04/25/20 0001      Exercises   Exercises Other Exercises    Other Exercises  performed strength ABC packet today with performance of each  stretch x 15" and each weighted exercise x 10 using 2#      Shoulder Exercises: Pulleys   Flexion 2 minutes    Scaption 2 minutes      Manual Therapy   Soft tissue mobilization to bil pectoralis    Passive ROM to bil shoulders into flexion, abduction, ER at various angles prolonged holds into ER for pectoralis stretching                       PT Long Term Goals - 03/28/20 0949      PT LONG TERM GOAL #1   Title Pt will be educated on lymphedema surveillance and risk reduction    Time 1    Period Weeks    Status Achieved      PT LONG TERM GOAL #2   Title Pt will improve bil shoulder AROM to at least 150 flexion and abduction to improve AROM    Baseline Rt: flex: 130 Abd: 122,     Lt: flex: 130, Abd: 113  Time 6    Period Weeks    Status New      PT LONG TERM GOAL #3   Title Pt will be educated on strength ABC and return to light weights    Time 6    Period Weeks    Status New                 Plan - 04/25/20 0854    Clinical Impression Statement Pt continues to tolerate chemotherapy well and has energy for continued exercise and work activities.  Educated pt on strength ABC program for strength work at home.  Still with some limitations into ER+flexion+Abd with pectoralis tightness but improving    PT Frequency 1x / week    PT Duration 6 weeks    PT Treatment/Interventions ADLs/Self Care Home Management;Passive range of motion;Patient/family education;Manual lymph drainage;Manual techniques;Therapeutic exercise    PT Next Visit Plan pulleys AAROM/PROM, PROM bil    PT Home Exercise Plan Access Code: [email or web address], strength ABC    Consulted and Agree with Plan of Care Patient           Patient will benefit from skilled therapeutic  intervention in order to improve the following deficits and impairments:     Visit Diagnosis: Aftercare following surgery for neoplasm  H/O bilateral mastectomy  Shoulder stiffness, left  Stiffness of right shoulder, not elsewhere classified     Problem List Patient Active Problem List   Diagnosis Date Noted  . Cancer of right female breast ([place]) 02/28/2020  . Malignant neoplasm of lower-outer quadrant of right breast of female, estrogen receptor positive ([place]) 08/08/2019    [name] 04/25/2020, 8:56 AM  [name], [place], [address] Phone: [phone]   Fax:  [phone]  Name: [name] MRN: [id number] Date of Birth: [birth year]

## 2020-05-02 ENCOUNTER — Telehealth: Payer: Self-pay | Admitting: *Deleted

## 2020-05-02 NOTE — Telephone Encounter (Signed)
Received call from pt with complaint of increase fullness after eating a small amount of food.  Pt describes fullness as "food sitting at the top of her esophagus".  Pt denies any oral sores or s/s of oral thrush.  Per MD pt may be experiencing acid reflux and pt needing to take OTC Pepcid 20 mg p.o. daily.  RN educated pt on medication and to eat smaller more frequent meals.  Pt verbalized understanding and appreciative of the advice.

## 2020-05-06 ENCOUNTER — Encounter: Payer: Self-pay | Admitting: *Deleted

## 2020-05-06 NOTE — Progress Notes (Signed)
Patient Care Team: Deland Pretty, MD as PCP - General (Internal Medicine) Mauro Kaufmann, RN as Medical Oncologist Rockwell Germany, RN as Oncology Nurse Navigator  DIAGNOSIS:    ICD-10-CM   1. Malignant neoplasm of lower-outer quadrant of right breast of female, estrogen receptor positive ([place])  C50.511    Z17.0     SUMMARY OF ONCOLOGIC HISTORY: Oncology History  Malignant neoplasm of lower-outer quadrant of right breast of female, estrogen receptor positive ([place])  08/01/2019 Initial Diagnosis   Palpable right breast mass for 1 month.  Mammogram revealed a 5 cm mass 8:30 position, 3 cm intramammary lymph node at 10 o'clock position and 2 enlarged right axillary lymph nodes.  Biopsy revealed grade 1 IDC ER 90%, PR 10%, Ki-67 10%, HER-2 negative.  Intramammary node and axillary lymph node both positive with similar prognostic profile   08/08/2019 -  Neo-Adjuvant Anti-estrogen oral therapy   Anastrozole while deciding treatment plan    02/28/2020 Surgery   Bilateral mastectomies [name]):  Right breast: IDC, 6.5cm, metastatic carcinoma in 6/7 right axillary lymph nodes, 1.5cm, 1.5cm, and 0.3cm, and one intramammary lymph node, 2.5cm Left breast: no evidence of malignancy in the breast or 1 left axillary lymph node.   03/26/2020 -  Chemotherapy    Patient is on Treatment Plan: BREAST ADJUVANT DOSE DENSE AC Q14D / PACLITAXEL Q7D        CHIEF COMPLIANT: Cycle4Day 1Adriamycin and Cytoxan  INTERVAL HISTORY: [name] is a 65 y.o. with above-mentioned history of right breast cancertreated withneoadjuvant antiestrogen therapy,bilateral mastectomies, and who is currently on adjuvant chemotherapy with dose dense Adriamcyin and Cytoxan. She presents today for a toxicity checkand treatment. She has more fatigue than before.  She is however able to walk and exercise regularly.  She stays very busy at work as well.  ALLERGIES:  has No Known Allergies.  MEDICATIONS:  Current  Outpatient Medications  Medication Sig Dispense Refill  . calcium carbonate (OS-CAL) 1250 (500 Ca) MG chewable tablet Chew 1 tablet by mouth daily.    . cholecalciferol (VITAMIN D3) 25 MCG (1000 UNIT) tablet Take 1,000 Units by mouth daily.    Marland Kitchen dexamethasone (DECADRON) 4 MG tablet Take 2 tablets (8 mg total) by mouth daily. Take daily for 3 days after chemo. Take with food. 30 tablet 1  . levothyroxine (SYNTHROID) 125 MCG tablet Take 1 tablet (125 mcg total) by mouth daily before breakfast.    . lidocaine-prilocaine (EMLA) cream Apply to affected area once 30 g 3  . Multiple Vitamins-Minerals (MULTIVITAMIN WITH MINERALS) tablet Take 1 tablet by mouth daily.    . ondansetron (ZOFRAN) 8 MG tablet Take 1 tablet (8 mg total) by mouth 2 (two) times daily as needed. Start on the third day after chemotherapy. 30 tablet 1  . prochlorperazine (COMPAZINE) 10 MG tablet Take 1 tablet (10 mg total) by mouth every 6 (six) hours as needed (Nausea or vomiting). 30 tablet 1  . rosuvastatin (CRESTOR) 10 MG tablet Take 1 tablet (10 mg total) by mouth daily.     No current facility-administered medications for this visit.    PHYSICAL EXAMINATION: ECOG PERFORMANCE STATUS: 1 - Symptomatic but completely ambulatory  Vitals:   05/07/20 1058  BP: 123/62  Pulse: 81  Resp: 18  Temp: 97.7 F (36.5 C)  SpO2: 100%   Filed Weights   05/07/20 1058  Weight: 145 lb 4.8 oz (65.9 kg)    LABORATORY DATA:  I have reviewed the data as listed  CMP Latest Ref Rng & Units 04/23/2020 04/10/2020 04/02/2020  Glucose 70 - 99 mg/dL 122(H) 137(H) 121(H)  BUN 8 - 23 mg/dL '12 13 10  ' Creatinine 0.44 - 1.00 mg/dL 0.74 0.68 0.73  Sodium 135 - 145 mmol/L 143 140 137  Potassium 3.5 - 5.1 mmol/L 3.7 3.5 3.6  Chloride 98 - 111 mmol/L 107 107 104  CO2 22 - 32 mmol/L '26 24 27  ' Calcium 8.9 - 10.3 mg/dL 8.9 9.1 9.0  Total Protein 6.5 - 8.1 g/dL 6.7 7.2 7.1  Total Bilirubin 0.3 - 1.2 mg/dL 0.3 0.3 0.5  Alkaline Phos 38 - 126 U/L 121 112  91  AST 15 - 41 U/L 15 15 10(L)  ALT 0 - 44 U/L '17 18 14    ' Lab Results  Component Value Date   WBC 29.2 (H) 04/23/2020   HGB 10.4 (L) 04/23/2020   HCT 30.4 (L) 04/23/2020   MCV 87.9 04/23/2020   PLT 133 (L) 04/23/2020   NEUTROABS 19.8 (H) 04/23/2020    ASSESSMENT & PLAN:  Malignant neoplasm of lower-outer quadrant of right breast of female, estrogen receptor positive ([place]) 08/01/2019:Palpable right breast mass for 1 month. Mammogram revealed a 5 cm mass 8:30 position, 3 cm intramammary lymph node at 10 o'clock position and 2 enlarged right axillary lymph nodes. Biopsy revealed grade 1 IDC ER 90%, PR 10%, Ki-67 10%, HER-2 negative. Intramammary node and axillary lymph node both positive with similar prognostic profile  Breast MRI 08/15/2019: Biopsy-proven malignancy lower outer quadrant right breast, non-mass enhancement spanning 8.7 cm with probable invasion of underlying pectoral muscle. Biopsy-proven metastatic lymph node in the right axillary tail. Mild asymmetric right infraclavicular adenopathy.Biopsy positive for invasive ductal carcinoma grade 1  CT CAP 08/24/2019: Right axillary and right retropectoral adenopathy compatible with nodal metastases. Indistinct sclerotic 1.2 cm left acetabular lesion. No other distant metastases MammaPrint: Luminal type A, low risk, no benefit to chemotherapy Bone scan: Negative for metastatic disease  TreatmentSummary: 1.Neoadjuvant antiestrogen therapy with anastrozole 1 mg daily started 08/08/2019 2.bilateral mastectomies1/13/2022: Right mastectomy with ALND: IDC 6.5 cm, 7/8 lymph nodes positive;left mastectomy: Benign ER 90%, PR 10%, Ki-67 10%, HER2 negative 3.given the fact that the patient had 7 positive lymph nodes, the standard of care would be to offer adjuvant chemotherapy. I recommended dose dense Adriamycin and Cytoxan followed by Taxol. 4.Adjradiation 5.Followed by adjuvant antiestrogen therapy with the CDK 4 and  6 inhibitor ----------------------------------------------------------------------------------------------------------------------------- Current Treatment: Cycle4Dose dense Adriamycin and Cytoxan   Chemo toxicities: Alopecia Denies any nausea or vomiting. Denies any change in appetite or taste. Leukocytosis: Secondary to Neulasta injection. Fatigue: Progressively worsening with each cycle of chemo Chemo induced anemia: Monitoring closely.  Return to clinic in 2 weeks for cycle 1 Taxol    No orders of the defined types were placed in this encounter.  The patient has a good understanding of the overall plan. she agrees with it. she will call with any problems that may develop before the next visit here.  Total time spent: 30 mins including face to face time and time spent for planning, charting and coordination of care  Rulon Eisenmenger, MD, MPH 05/07/2020  I, Molly Dorshimer, am acting as scribe for Dr. [name].  I have reviewed the above documentation for accuracy and completeness, and I agree with the above.

## 2020-05-06 NOTE — Assessment & Plan Note (Signed)
08/01/2019:Palpable right breast mass for 1 month. Mammogram revealed a 5 cm mass 8:30 position, 3 cm intramammary lymph node at 10 o'clock position and 2 enlarged right axillary lymph nodes. Biopsy revealed grade 1 IDC ER 90%, PR 10%, Ki-67 10%, HER-2 negative. Intramammary node and axillary lymph node both positive with similar prognostic profile  Breast MRI 08/15/2019: Biopsy-proven malignancy lower outer quadrant right breast, non-mass enhancement spanning 8.7 cm with probable invasion of underlying pectoral muscle. Biopsy-proven metastatic lymph node in the right axillary tail. Mild asymmetric right infraclavicular adenopathy.Biopsy positive for invasive ductal carcinoma grade 1  CT CAP 08/24/2019: Right axillary and right retropectoral adenopathy compatible with nodal metastases. Indistinct sclerotic 1.2 cm left acetabular lesion. No other distant metastases MammaPrint: Luminal type A, low risk, no benefit to chemotherapy Bone scan: Negative for metastatic disease  TreatmentSummary: 1.Neoadjuvant antiestrogen therapy with anastrozole 1 mg daily started 08/08/2019 2.bilateral mastectomies1/13/2022: Right mastectomy with ALND: IDC 6.5 cm, 7/8 lymph nodes positive;left mastectomy: Benign ER 90%, PR 10%, Ki-67 10%, HER2 negative 3.given the fact that the patient had 7 positive lymph nodes, the standard of care would be to offer adjuvant chemotherapy. I recommended dose dense Adriamycin and Cytoxan followed by Taxol. 4.Adjradiation 5.Followed by adjuvant antiestrogen therapy with the CDK 4 and 6 inhibitor ----------------------------------------------------------------------------------------------------------------------------- Current Treatment: Cycle4Dose dense Adriamycin and Cytoxan   Chemo toxicities: Tolerating chemo extremely well. Denies any nausea or vomiting. Denies any change in appetite or taste. Leukocytosis: Secondary to Neulasta injection.  Return to  clinic in 2 weeks for cycle 1 Taxol

## 2020-05-07 ENCOUNTER — Inpatient Hospital Stay (HOSPITAL_BASED_OUTPATIENT_CLINIC_OR_DEPARTMENT_OTHER): Payer: Commercial Managed Care - PPO | Admitting: Hematology and Oncology

## 2020-05-07 ENCOUNTER — Other Ambulatory Visit: Payer: Self-pay

## 2020-05-07 ENCOUNTER — Inpatient Hospital Stay: Payer: Commercial Managed Care - PPO

## 2020-05-07 DIAGNOSIS — Z170 Estrogen receptor positive status [ER+]: Secondary | ICD-10-CM | POA: Diagnosis not present

## 2020-05-07 DIAGNOSIS — C50511 Malignant neoplasm of lower-outer quadrant of right female breast: Secondary | ICD-10-CM

## 2020-05-07 DIAGNOSIS — Z5111 Encounter for antineoplastic chemotherapy: Secondary | ICD-10-CM | POA: Diagnosis not present

## 2020-05-07 DIAGNOSIS — Z95828 Presence of other vascular implants and grafts: Secondary | ICD-10-CM

## 2020-05-07 LAB — CBC WITH DIFFERENTIAL (CANCER CENTER ONLY)
Abs Immature Granulocytes: 5.11 10*3/uL — ABNORMAL HIGH (ref 0.00–0.07)
Basophils Absolute: 0.2 10*3/uL — ABNORMAL HIGH (ref 0.0–0.1)
Basophils Relative: 1 %
Eosinophils Absolute: 0 10*3/uL (ref 0.0–0.5)
Eosinophils Relative: 0 %
HCT: 27.8 % — ABNORMAL LOW (ref 36.0–46.0)
Hemoglobin: 9.5 g/dL — ABNORMAL LOW (ref 12.0–15.0)
Immature Granulocytes: 18 %
Lymphocytes Relative: 6 %
Lymphs Abs: 1.6 10*3/uL (ref 0.7–4.0)
MCH: 30.4 pg (ref 26.0–34.0)
MCHC: 34.2 g/dL (ref 30.0–36.0)
MCV: 89.1 fL (ref 80.0–100.0)
Monocytes Absolute: 1.5 10*3/uL — ABNORMAL HIGH (ref 0.1–1.0)
Monocytes Relative: 5 %
Neutro Abs: 19.8 10*3/uL — ABNORMAL HIGH (ref 1.7–7.7)
Neutrophils Relative %: 70 %
Platelet Count: 216 10*3/uL (ref 150–400)
RBC: 3.12 MIL/uL — ABNORMAL LOW (ref 3.87–5.11)
RDW: 15.3 % (ref 11.5–15.5)
WBC Count: 28.2 10*3/uL — ABNORMAL HIGH (ref 4.0–10.5)
nRBC: 0.4 % — ABNORMAL HIGH (ref 0.0–0.2)

## 2020-05-07 LAB — CMP (CANCER CENTER ONLY)
ALT: 14 U/L (ref 0–44)
AST: 13 U/L — ABNORMAL LOW (ref 15–41)
Albumin: 3.9 g/dL (ref 3.5–5.0)
Alkaline Phosphatase: 105 U/L (ref 38–126)
Anion gap: 10 (ref 5–15)
BUN: 10 mg/dL (ref 8–23)
CO2: 25 mmol/L (ref 22–32)
Calcium: 9 mg/dL (ref 8.9–10.3)
Chloride: 106 mmol/L (ref 98–111)
Creatinine: 0.63 mg/dL (ref 0.44–1.00)
GFR, Estimated: >60 mL/min (ref >60)
Glucose, Bld: 116 mg/dL — ABNORMAL HIGH (ref 70–99)
Potassium: 3.9 mmol/L (ref 3.5–5.1)
Sodium: 141 mmol/L (ref 135–145)
Total Bilirubin: 0.2 mg/dL — ABNORMAL LOW (ref 0.3–1.2)
Total Protein: 6.5 g/dL (ref 6.5–8.1)

## 2020-05-07 MED ORDER — SODIUM CHLORIDE 0.9 % IV SOLN
150.0000 mg | Freq: Once | INTRAVENOUS | Status: AC
  Administered 2020-05-07: 150 mg via INTRAVENOUS
  Filled 2020-05-07: qty 150

## 2020-05-07 MED ORDER — HEPARIN SOD (PORK) LOCK FLUSH 100 UNIT/ML IV SOLN
500.0000 [IU] | Freq: Once | INTRAVENOUS | Status: AC | PRN
  Administered 2020-05-07: 500 [IU]
  Filled 2020-05-07: qty 5

## 2020-05-07 MED ORDER — SODIUM CHLORIDE 0.9 % IV SOLN
Freq: Once | INTRAVENOUS | Status: AC
  Filled 2020-05-07: qty 250

## 2020-05-07 MED ORDER — PALONOSETRON HCL INJECTION 0.25 MG/5ML
INTRAVENOUS | Status: AC
  Filled 2020-05-07: qty 5

## 2020-05-07 MED ORDER — SODIUM CHLORIDE 0.9% FLUSH
10.0000 mL | INTRAVENOUS | Status: DC | PRN
  Administered 2020-05-07: 10 mL
  Filled 2020-05-07: qty 10

## 2020-05-07 MED ORDER — SODIUM CHLORIDE 0.9 % IV SOLN
600.0000 mg/m2 | Freq: Once | INTRAVENOUS | Status: AC
  Administered 2020-05-07: 1060 mg via INTRAVENOUS
  Filled 2020-05-07: qty 53

## 2020-05-07 MED ORDER — DOXORUBICIN HCL CHEMO IV INJECTION 2 MG/ML
60.0000 mg/m2 | Freq: Once | INTRAVENOUS | Status: AC
  Administered 2020-05-07: 106 mg via INTRAVENOUS
  Filled 2020-05-07: qty 53

## 2020-05-07 MED ORDER — SODIUM CHLORIDE 0.9% FLUSH
10.0000 mL | INTRAVENOUS | Status: DC | PRN
  Administered 2020-05-07: 10 mL via INTRAVENOUS
  Filled 2020-05-07: qty 10

## 2020-05-07 MED ORDER — SODIUM CHLORIDE 0.9 % IV SOLN
10.0000 mg | Freq: Once | INTRAVENOUS | Status: AC
  Administered 2020-05-07: 10 mg via INTRAVENOUS
  Filled 2020-05-07: qty 10

## 2020-05-07 MED ORDER — PALONOSETRON HCL INJECTION 0.25 MG/5ML
0.2500 mg | Freq: Once | INTRAVENOUS | Status: AC
  Administered 2020-05-07: 0.25 mg via INTRAVENOUS

## 2020-05-07 NOTE — Patient Instructions (Signed)
Implanted Port Insertion, Care After This sheet gives you information about how to care for yourself after your procedure. Your health care provider may also give you more specific instructions. If you have problems or questions, contact your health care provider. What can I expect after the procedure? After the procedure, it is common to have:  Discomfort at the port insertion site.  Bruising on the skin over the port. This should improve over 3-4 days. Follow these instructions at home: Port care  After your port is placed, you will get a manufacturer's information card. The card has information about your port. Keep this card with you at all times.  Take care of the port as told by your health care provider. Ask your health care provider if you or a family member can get training for taking care of the port at home. A home health care nurse may also take care of the port.  Make sure to remember what type of port you have. Incision care  Follow instructions from your health care provider about how to take care of your port insertion site. Make sure you: ? Wash your hands with soap and water before and after you change your bandage (dressing). If soap and water are not available, use hand sanitizer. ? Change your dressing as told by your health care provider. ? Leave stitches (sutures), skin glue, or adhesive strips in place. These skin closures may need to stay in place for 2 weeks or longer. If adhesive strip edges start to loosen and curl up, you may trim the loose edges. Do not remove adhesive strips completely unless your health care provider tells you to do that.  Check your port insertion site every day for signs of infection. Check for: ? Redness, swelling, or pain. ? Fluid or blood. ? Warmth. ? Pus or a bad smell.      Activity  Return to your normal activities as told by your health care provider. Ask your health care provider what activities are safe for you.  Do not  lift anything that is heavier than 10 lb (4.5 kg), or the limit that you are told, until your health care provider says that it is safe. General instructions  Take over-the-counter and prescription medicines only as told by your health care provider.  Do not take baths, swim, or use a hot tub until your health care provider approves. Ask your health care provider if you may take showers. You may only be allowed to take sponge baths.  Do not drive for 24 hours if you were given a sedative during your procedure.  Wear a medical alert bracelet in case of an emergency. This will tell any health care providers that you have a port.  Keep all follow-up visits as told by your health care provider. This is important. Contact a health care provider if:  You cannot flush your port with saline as directed, or you cannot draw blood from the port.  You have a fever or chills.  You have redness, swelling, or pain around your port insertion site.  You have fluid or blood coming from your port insertion site.  Your port insertion site feels warm to the touch.  You have pus or a bad smell coming from the port insertion site. Get help right away if:  You have chest pain or shortness of breath.  You have bleeding from your port that you cannot control. Summary  Take care of the port as told by your   health care provider. Keep the manufacturer's information card with you at all times.  Change your dressing as told by your health care provider.  Contact a health care provider if you have a fever or chills or if you have redness, swelling, or pain around your port insertion site.  Keep all follow-up visits as told by your health care provider. This information is not intended to replace advice given to you by your health care provider. Make sure you discuss any questions you have with your health care provider. Document Revised: 08/30/2017 Document Reviewed: 08/30/2017 Elsevier Patient Education   2021 [employer]  

## 2020-05-07 NOTE — Patient Instructions (Signed)
[facility] Discharge Instructions for Patients Receiving Chemotherapy  Today you received the following chemotherapy agents: doxorubicin/cyclophosphamide.  To help prevent nausea and vomiting after your treatment, we encourage you to take your nausea medication as directed.   If you develop nausea and vomiting that is not controlled by your nausea medication, call the clinic.   BELOW ARE SYMPTOMS THAT SHOULD BE REPORTED IMMEDIATELY:  *FEVER GREATER THAN 100.5 F  *CHILLS WITH OR WITHOUT FEVER  NAUSEA AND VOMITING THAT IS NOT CONTROLLED WITH YOUR NAUSEA MEDICATION  *UNUSUAL SHORTNESS OF BREATH  *UNUSUAL BRUISING OR BLEEDING  TENDERNESS IN MOUTH AND THROAT WITH OR WITHOUT PRESENCE OF ULCERS  *URINARY PROBLEMS  *BOWEL PROBLEMS  UNUSUAL RASH Items with * indicate a potential emergency and should be followed up as soon as possible.  Feel free to call the clinic should you have any questions or concerns. The clinic phone number is [phone].  Please show the [place] at check-in to the Emergency Department and triage nurse.

## 2020-05-08 ENCOUNTER — Telehealth: Payer: Self-pay | Admitting: Hematology and Oncology

## 2020-05-08 NOTE — Telephone Encounter (Signed)
Scheduled per 3/23 los. Pt will receive an updated appt calendar per next visit appt notes

## 2020-05-09 ENCOUNTER — Other Ambulatory Visit: Payer: Self-pay

## 2020-05-09 ENCOUNTER — Inpatient Hospital Stay: Payer: Commercial Managed Care - PPO

## 2020-05-09 ENCOUNTER — Encounter: Payer: Commercial Managed Care - PPO | Admitting: Rehabilitation

## 2020-05-09 VITALS — BP 110/61 | HR 73

## 2020-05-09 DIAGNOSIS — Z170 Estrogen receptor positive status [ER+]: Secondary | ICD-10-CM

## 2020-05-09 DIAGNOSIS — C50511 Malignant neoplasm of lower-outer quadrant of right female breast: Secondary | ICD-10-CM

## 2020-05-09 DIAGNOSIS — Z5111 Encounter for antineoplastic chemotherapy: Secondary | ICD-10-CM | POA: Diagnosis not present

## 2020-05-09 MED ORDER — PEGFILGRASTIM-BMEZ 6 MG/0.6ML ~~LOC~~ SOSY
PREFILLED_SYRINGE | SUBCUTANEOUS | Status: AC
  Filled 2020-05-09: qty 0.6

## 2020-05-09 MED ORDER — PEGFILGRASTIM-BMEZ 6 MG/0.6ML ~~LOC~~ SOSY
6.0000 mg | PREFILLED_SYRINGE | Freq: Once | SUBCUTANEOUS | Status: AC
Start: 2020-05-09 — End: 2020-05-09
  Administered 2020-05-09: 6 mg via SUBCUTANEOUS

## 2020-05-13 ENCOUNTER — Telehealth: Payer: Self-pay | Admitting: *Deleted

## 2020-05-13 NOTE — Telephone Encounter (Signed)
Received call from pt with complaint of difficulty falling asleep at night.  Per MD pt to take OTC Melatonin p.o 5 mg supplement.  Pt educated and verbalized understanding and appreciative of advice.

## 2020-05-16 ENCOUNTER — Ambulatory Visit: Payer: Commercial Managed Care - PPO | Admitting: Rehabilitation

## 2020-05-20 NOTE — Progress Notes (Signed)
Patient Care Team: Katelyn Pretty, MD as PCP - General (Internal Medicine) Katelyn Kaufmann, RN as Medical Oncologist Katelyn Germany, RN as Oncology Nurse Navigator  DIAGNOSIS:    ICD-10-CM   1. Malignant neoplasm of lower-outer quadrant of right breast of female, estrogen receptor positive ([place])  C50.511    Z17.0     SUMMARY OF ONCOLOGIC HISTORY: Oncology History  Malignant neoplasm of lower-outer quadrant of right breast of female, estrogen receptor positive ([place])  08/01/2019 Initial Diagnosis   Palpable right breast mass for 1 month.  Mammogram revealed a 5 cm mass 8:30 position, 3 cm intramammary lymph node at 10 o'clock position and 2 enlarged right axillary lymph nodes.  Biopsy revealed grade 1 IDC ER 90%, PR 10%, Ki-67 10%, HER-2 negative.  Intramammary node and axillary lymph node both positive with similar prognostic profile   08/08/2019 -  Neo-Adjuvant Anti-estrogen oral therapy   Anastrozole while deciding treatment plan    02/28/2020 Surgery   Bilateral mastectomies [name]):  Right breast: IDC, 6.5cm, metastatic carcinoma in 6/7 right axillary lymph nodes, 1.5cm, 1.5cm, and 0.3cm, and one intramammary lymph node, 2.5cm Left breast: no evidence of malignancy in the breast or 1 left axillary lymph node.   03/26/2020 -  Chemotherapy    Patient is on Treatment Plan: BREAST ADJUVANT DOSE DENSE AC Q14D / PACLITAXEL Q7D        CHIEF COMPLIANT: Cycle1Taxol  INTERVAL HISTORY: [name] is a 65 y.o. with above-mentioned history of right breast cancertreated withneoadjuvant antiestrogen therapy,bilateral mastectomies, and who is currently on adjuvant chemotherapy with Taxol after completing four cycles of dose dense Adriamcyin and Cytoxan. She presents today for a toxicity checkand treatment. After the last chemo she had found fatigue that lasted for a whole week.  Only couple of days ago she started feeling better.  Her blood work suggest that her anemia has gotten  markedly worse.  ALLERGIES:  has No Known Allergies.  MEDICATIONS:  Current Outpatient Medications  Medication Sig Dispense Refill  . calcium carbonate (OS-CAL) 1250 (500 Ca) MG chewable tablet Chew 1 tablet by mouth daily.    . cholecalciferol (VITAMIN D3) 25 MCG (1000 UNIT) tablet Take 1,000 Units by mouth daily.    Marland Kitchen dexamethasone (DECADRON) 4 MG tablet Take 2 tablets (8 mg total) by mouth daily. Take daily for 3 days after chemo. Take with food. 30 tablet 1  . levothyroxine (SYNTHROID) 125 MCG tablet Take 1 tablet (125 mcg total) by mouth daily before breakfast.    . lidocaine-prilocaine (EMLA) cream Apply to affected area once 30 g 3  . Multiple Vitamins-Minerals (MULTIVITAMIN WITH MINERALS) tablet Take 1 tablet by mouth daily.    . ondansetron (ZOFRAN) 8 MG tablet Take 1 tablet (8 mg total) by mouth 2 (two) times daily as needed. Start on the third day after chemotherapy. 30 tablet 1  . prochlorperazine (COMPAZINE) 10 MG tablet Take 1 tablet (10 mg total) by mouth every 6 (six) hours as needed (Nausea or vomiting). 30 tablet 1  . rosuvastatin (CRESTOR) 10 MG tablet Take 1 tablet (10 mg total) by mouth daily.     No current facility-administered medications for this visit.    PHYSICAL EXAMINATION: ECOG PERFORMANCE STATUS: 1 - Symptomatic but completely ambulatory  Vitals:   05/21/20 1134  BP: 126/64  Pulse: 82  Resp: 18  Temp: 97.6 F (36.4 C)  SpO2: 100%   Filed Weights   05/21/20 1134  Weight: 145 lb 9.6 oz (66  kg)    LABORATORY DATA:  I have reviewed the data as listed CMP Latest Ref Rng & Units 05/07/2020 04/23/2020 04/10/2020  Glucose 70 - 99 mg/dL 116(H) 122(H) 137(H)  BUN 8 - 23 mg/dL '10 12 13  ' Creatinine 0.35 - 1.00 mg/dL 0.63 0.74 0.68  Sodium 135 - 145 mmol/L 141 143 140  Potassium 3.5 - 5.1 mmol/L 3.9 3.7 3.5  Chloride 98 - 111 mmol/L 106 107 107  CO2 22 - 32 mmol/L '25 26 24  ' Calcium 8.9 - 10.3 mg/dL 9.0 8.9 9.1  Total Protein 6.5 - 8.1 g/dL 6.5 6.7 7.2   Total Bilirubin 0.3 - 1.2 mg/dL 0.2(L) 0.3 0.3  Alkaline Phos 38 - 126 U/L 105 121 112  AST 15 - 41 U/L 13(L) 15 15  ALT 0 - 44 U/L '14 17 18    ' Lab Results  Component Value Date   WBC 27.2 (H) 05/21/2020   HGB 8.9 (L) 05/21/2020   HCT 26.1 (L) 05/21/2020   MCV 90.6 05/21/2020   PLT 171 05/21/2020   NEUTROABS PENDING 05/21/2020    ASSESSMENT & PLAN:  Malignant neoplasm of lower-outer quadrant of right breast of female, estrogen receptor positive ([place]) 08/01/2019:Palpable right breast mass for 1 month. Mammogram revealed a 5 cm mass 8:30 position, 3 cm intramammary lymph node at 10 o'clock position and 2 enlarged right axillary lymph nodes. Biopsy revealed grade 1 IDC ER 90%, PR 10%, Ki-67 10%, HER-2 negative. Intramammary node and axillary lymph node both positive with similar prognostic profile  Breast MRI 08/15/2019: Biopsy-proven malignancy lower outer quadrant right breast, non-mass enhancement spanning 8.7 cm with probable invasion of underlying pectoral muscle. Biopsy-proven metastatic lymph node in the right axillary tail. Mild asymmetric right infraclavicular adenopathy.Biopsy positive for invasive ductal carcinoma grade 1  CT CAP 08/24/2019: Right axillary and right retropectoral adenopathy compatible with nodal metastases. Indistinct sclerotic 1.2 cm left acetabular lesion. No other distant metastases MammaPrint: Luminal type A, low risk, no benefit to chemotherapy Bone scan: Negative for metastatic disease  TreatmentSummary: 1.Neoadjuvant antiestrogen therapy with anastrozole 1 mg daily started 08/08/2019 2.bilateral mastectomies1/13/2022: Right mastectomy with ALND: IDC 6.5 cm, 7/8 lymph nodes positive;left mastectomy: Benign ER 90%, PR 10%, Ki-67 10%, HER2 negative 3.given the fact that the patient had 7 positive lymph nodes, the standard of care would be to offer adjuvant chemotherapy. I recommended dose dense Adriamycin and Cytoxan followed by  Taxol. 4.Adjradiation 5.Followed by adjuvant antiestrogen therapy with the CDK 4 and 6 inhibitor ----------------------------------------------------------------------------------------------------------------------------- Current Treatment: Completed 4 cycles ofDose dense Adriamycin and Cytoxan, Today is cycle 1 Taxol  Chemo Toxicities: 1. Fatigue: Progressively worsening with each cycle of chemo 2. Chemo induced anemia: Anemia has gotten markedly worse with the today's hemoglobin of 8.9.  She started her chemo with a hemoglobin of 12.9.  However she does not need any blood transfusions.  I discussed with her that her hemoglobin will slowly improve on Taxol but it may go lower before it starts to improve.  I instructed her to discontinue dexamethasone.  She will not receive any further Neulasta.  Return to clinic weekly for Taxol    No orders of the defined types were placed in this encounter.  The patient has a good understanding of the overall plan. she agrees with it. she will call with any problems that may develop before the next visit here.  Total time spent: 30 mins including face to face time and time spent for planning, charting and coordination of care  Rulon Eisenmenger, MD, MPH 05/21/2020  I, Molly Dorshimer, am acting as scribe for Dr. [name].  I have reviewed the above documentation for accuracy and completeness, and I agree with the above.

## 2020-05-21 ENCOUNTER — Inpatient Hospital Stay: Payer: Commercial Managed Care - PPO

## 2020-05-21 ENCOUNTER — Other Ambulatory Visit: Payer: Self-pay

## 2020-05-21 ENCOUNTER — Inpatient Hospital Stay (HOSPITAL_BASED_OUTPATIENT_CLINIC_OR_DEPARTMENT_OTHER): Payer: Commercial Managed Care - PPO | Admitting: Hematology and Oncology

## 2020-05-21 ENCOUNTER — Inpatient Hospital Stay: Payer: Commercial Managed Care - PPO | Attending: Hematology and Oncology

## 2020-05-21 VITALS — BP 125/68 | HR 87 | Resp 18

## 2020-05-21 DIAGNOSIS — Z5111 Encounter for antineoplastic chemotherapy: Secondary | ICD-10-CM | POA: Diagnosis present

## 2020-05-21 DIAGNOSIS — Z170 Estrogen receptor positive status [ER+]: Secondary | ICD-10-CM

## 2020-05-21 DIAGNOSIS — C50511 Malignant neoplasm of lower-outer quadrant of right female breast: Secondary | ICD-10-CM | POA: Insufficient documentation

## 2020-05-21 DIAGNOSIS — C773 Secondary and unspecified malignant neoplasm of axilla and upper limb lymph nodes: Secondary | ICD-10-CM | POA: Diagnosis not present

## 2020-05-21 DIAGNOSIS — Z95828 Presence of other vascular implants and grafts: Secondary | ICD-10-CM

## 2020-05-21 LAB — CMP (CANCER CENTER ONLY)
ALT: 13 U/L (ref 0–44)
AST: 11 U/L — ABNORMAL LOW (ref 15–41)
Albumin: 4 g/dL (ref 3.5–5.0)
Alkaline Phosphatase: 110 U/L (ref 38–126)
Anion gap: 10 (ref 5–15)
BUN: 13 mg/dL (ref 8–23)
CO2: 25 mmol/L (ref 22–32)
Calcium: 8.7 mg/dL — ABNORMAL LOW (ref 8.9–10.3)
Chloride: 105 mmol/L (ref 98–111)
Creatinine: 0.65 mg/dL (ref 0.44–1.00)
GFR, Estimated: >60 mL/min (ref >60)
Glucose, Bld: 146 mg/dL — ABNORMAL HIGH (ref 70–99)
Potassium: 3.8 mmol/L (ref 3.5–5.1)
Sodium: 140 mmol/L (ref 135–145)
Total Bilirubin: 0.3 mg/dL (ref 0.3–1.2)
Total Protein: 6.4 g/dL — ABNORMAL LOW (ref 6.5–8.1)

## 2020-05-21 LAB — CBC WITH DIFFERENTIAL (CANCER CENTER ONLY)
Abs Immature Granulocytes: 4.16 10*3/uL — ABNORMAL HIGH (ref 0.00–0.07)
Basophils Absolute: 0.2 10*3/uL — ABNORMAL HIGH (ref 0.0–0.1)
Basophils Relative: 1 %
Eosinophils Absolute: 0 10*3/uL (ref 0.0–0.5)
Eosinophils Relative: 0 %
HCT: 26.1 % — ABNORMAL LOW (ref 36.0–46.0)
Hemoglobin: 8.9 g/dL — ABNORMAL LOW (ref 12.0–15.0)
Immature Granulocytes: 15 %
Lymphocytes Relative: 5 %
Lymphs Abs: 1.3 10*3/uL (ref 0.7–4.0)
MCH: 30.9 pg (ref 26.0–34.0)
MCHC: 34.1 g/dL (ref 30.0–36.0)
MCV: 90.6 fL (ref 80.0–100.0)
Monocytes Absolute: 1.3 10*3/uL — ABNORMAL HIGH (ref 0.1–1.0)
Monocytes Relative: 5 %
Neutro Abs: 20.3 10*3/uL — ABNORMAL HIGH (ref 1.7–7.7)
Neutrophils Relative %: 74 %
Platelet Count: 171 10*3/uL (ref 150–400)
RBC: 2.88 MIL/uL — ABNORMAL LOW (ref 3.87–5.11)
RDW: 17.2 % — ABNORMAL HIGH (ref 11.5–15.5)
WBC Count: 27.2 10*3/uL — ABNORMAL HIGH (ref 4.0–10.5)
nRBC: 0.5 % — ABNORMAL HIGH (ref 0.0–0.2)

## 2020-05-21 MED ORDER — DIPHENHYDRAMINE HCL 50 MG/ML IJ SOLN
50.0000 mg | Freq: Once | INTRAMUSCULAR | Status: AC
  Administered 2020-05-21: 50 mg via INTRAVENOUS

## 2020-05-21 MED ORDER — HEPARIN SOD (PORK) LOCK FLUSH 100 UNIT/ML IV SOLN
500.0000 [IU] | Freq: Once | INTRAVENOUS | Status: AC | PRN
  Administered 2020-05-21: 500 [IU]
  Filled 2020-05-21: qty 5

## 2020-05-21 MED ORDER — DIPHENHYDRAMINE HCL 50 MG/ML IJ SOLN
INTRAMUSCULAR | Status: AC
  Filled 2020-05-21: qty 1

## 2020-05-21 MED ORDER — SODIUM CHLORIDE 0.9% FLUSH
10.0000 mL | INTRAVENOUS | Status: DC | PRN
  Administered 2020-05-21: 10 mL
  Filled 2020-05-21: qty 10

## 2020-05-21 MED ORDER — SODIUM CHLORIDE 0.9 % IV SOLN
Freq: Once | INTRAVENOUS | Status: AC
  Filled 2020-05-21: qty 250

## 2020-05-21 MED ORDER — SODIUM CHLORIDE 0.9 % IV SOLN
20.0000 mg | Freq: Once | INTRAVENOUS | Status: AC
  Administered 2020-05-21: 20 mg via INTRAVENOUS
  Filled 2020-05-21: qty 20

## 2020-05-21 MED ORDER — SODIUM CHLORIDE 0.9% FLUSH
10.0000 mL | Freq: Once | INTRAVENOUS | Status: AC
  Administered 2020-05-21: 10 mL via INTRAVENOUS
  Filled 2020-05-21: qty 10

## 2020-05-21 MED ORDER — FAMOTIDINE IN NACL 20-0.9 MG/50ML-% IV SOLN
INTRAVENOUS | Status: AC
  Filled 2020-05-21: qty 50

## 2020-05-21 MED ORDER — SODIUM CHLORIDE 0.9 % IV SOLN
80.0000 mg/m2 | Freq: Once | INTRAVENOUS | Status: AC
  Administered 2020-05-21: 144 mg via INTRAVENOUS
  Filled 2020-05-21: qty 24

## 2020-05-21 MED ORDER — FAMOTIDINE IN NACL 20-0.9 MG/50ML-% IV SOLN
20.0000 mg | Freq: Once | INTRAVENOUS | Status: AC
  Administered 2020-05-21: 20 mg via INTRAVENOUS

## 2020-05-21 NOTE — Assessment & Plan Note (Signed)
08/01/2019:Palpable right breast mass for 1 month. Mammogram revealed a 5 cm mass 8:30 position, 3 cm intramammary lymph node at 10 o'clock position and 2 enlarged right axillary lymph nodes. Biopsy revealed grade 1 IDC ER 90%, PR 10%, Ki-67 10%, HER-2 negative. Intramammary node and axillary lymph node both positive with similar prognostic profile  Breast MRI 08/15/2019: Biopsy-proven malignancy lower outer quadrant right breast, non-mass enhancement spanning 8.7 cm with probable invasion of underlying pectoral muscle. Biopsy-proven metastatic lymph node in the right axillary tail. Mild asymmetric right infraclavicular adenopathy.Biopsy positive for invasive ductal carcinoma grade 1  CT CAP 08/24/2019: Right axillary and right retropectoral adenopathy compatible with nodal metastases. Indistinct sclerotic 1.2 cm left acetabular lesion. No other distant metastases MammaPrint: Luminal type A, low risk, no benefit to chemotherapy Bone scan: Negative for metastatic disease  TreatmentSummary: 1.Neoadjuvant antiestrogen therapy with anastrozole 1 mg daily started 08/08/2019 2.bilateral mastectomies1/13/2022: Right mastectomy with ALND: IDC 6.5 cm, 7/8 lymph nodes positive;left mastectomy: Benign ER 90%, PR 10%, Ki-67 10%, HER2 negative 3.given the fact that the patient had 7 positive lymph nodes, the standard of care would be to offer adjuvant chemotherapy. I recommended dose dense Adriamycin and Cytoxan followed by Taxol. 4.Adjradiation 5.Followed by adjuvant antiestrogen therapy with the CDK 4 and 6 inhibitor ----------------------------------------------------------------------------------------------------------------------------- Current Treatment: Completed 4 cycles ofDose dense Adriamycin and Cytoxan, Today is cycle 1 Taxol  Chemo Toxicities: Fatigue: Progressively worsening with each cycle of chemo Chemo induced anemia: Monitoring closely.  Return to clinic weekly for  Taxol

## 2020-05-21 NOTE — Patient Instructions (Signed)
[facility] Discharge Instructions for Patients Receiving Chemotherapy  Today you received the following chemotherapy agents: paclitaxel.  To help prevent nausea and vomiting after your treatment, we encourage you to take your nausea medication as directed.   If you develop nausea and vomiting that is not controlled by your nausea medication, call the clinic.   BELOW ARE SYMPTOMS THAT SHOULD BE REPORTED IMMEDIATELY:  *FEVER GREATER THAN 100.5 F  *CHILLS WITH OR WITHOUT FEVER  NAUSEA AND VOMITING THAT IS NOT CONTROLLED WITH YOUR NAUSEA MEDICATION  *UNUSUAL SHORTNESS OF BREATH  *UNUSUAL BRUISING OR BLEEDING  TENDERNESS IN MOUTH AND THROAT WITH OR WITHOUT PRESENCE OF ULCERS  *URINARY PROBLEMS  *BOWEL PROBLEMS  UNUSUAL RASH Items with * indicate a potential emergency and should be followed up as soon as possible.  Feel free to call the clinic should you have any questions or concerns. The clinic phone number is [phone].  Please show the CHEMO ALERT CARD at check-in to the Emergency Department and triage nurse.   

## 2020-05-21 NOTE — Patient Instructions (Signed)

## 2020-05-26 NOTE — Progress Notes (Signed)
Patient Care Team: Deland Pretty, MD as PCP - General (Internal Medicine) Mauro Kaufmann, RN as Medical Oncologist Rockwell Germany, RN as Oncology Nurse Navigator  DIAGNOSIS:    ICD-10-CM   1. Malignant neoplasm of lower-outer quadrant of right breast of female, estrogen receptor positive ([place])  C50.511    Z17.0     SUMMARY OF ONCOLOGIC HISTORY: Oncology History  Malignant neoplasm of lower-outer quadrant of right breast of female, estrogen receptor positive ([place])  08/01/2019 Initial Diagnosis   Palpable right breast mass for 1 month.  Mammogram revealed a 5 cm mass 8:30 position, 3 cm intramammary lymph node at 10 o'clock position and 2 enlarged right axillary lymph nodes.  Biopsy revealed grade 1 IDC ER 90%, PR 10%, Ki-67 10%, HER-2 negative.  Intramammary node and axillary lymph node both positive with similar prognostic profile   08/08/2019 -  Neo-Adjuvant Anti-estrogen oral therapy   Anastrozole while deciding treatment plan    02/28/2020 Surgery   Bilateral mastectomies [name]):  Right breast: IDC, 6.5cm, metastatic carcinoma in 6/7 right axillary lymph nodes, 1.5cm, 1.5cm, and 0.3cm, and one intramammary lymph node, 2.5cm Left breast: no evidence of malignancy in the breast or 1 left axillary lymph node.   03/26/2020 -  Chemotherapy    Patient is on Treatment Plan: BREAST ADJUVANT DOSE DENSE AC Q14D / PACLITAXEL Q7D        CHIEF COMPLIANT: Cycle2Taxol  INTERVAL HISTORY: [name] is a 65 y.o. with above-mentioned history of right breast cancertreated withneoadjuvant antiestrogen therapy,bilateral mastectomies, and who is currently on adjuvant chemotherapy with Taxol after completing four cycles of dose dense Adriamcyin and Cytoxan. She presents today for a toxicity checkand treatment. She felt mild fatigue but overall tolerated the treatment fairly well.  She did not feel the severe fatigue related to prior treatments.  Denies any nausea or vomiting.  The  steroids that began during her treatment lasted for 24 hours and she could not sleep.  ALLERGIES:  has No Known Allergies.  MEDICATIONS:  Current Outpatient Medications  Medication Sig Dispense Refill  . calcium carbonate (OS-CAL) 1250 (500 Ca) MG chewable tablet Chew 1 tablet by mouth daily.    . cholecalciferol (VITAMIN D3) 25 MCG (1000 UNIT) tablet Take 1,000 Units by mouth daily.    Marland Kitchen levothyroxine (SYNTHROID) 125 MCG tablet Take 1 tablet (125 mcg total) by mouth daily before breakfast.    . lidocaine-prilocaine (EMLA) cream Apply to affected area once 30 g 3  . Multiple Vitamins-Minerals (MULTIVITAMIN WITH MINERALS) tablet Take 1 tablet by mouth daily.    . ondansetron (ZOFRAN) 8 MG tablet Take 1 tablet (8 mg total) by mouth 2 (two) times daily as needed. Start on the third day after chemotherapy. 30 tablet 1  . prochlorperazine (COMPAZINE) 10 MG tablet Take 1 tablet (10 mg total) by mouth every 6 (six) hours as needed (Nausea or vomiting). 30 tablet 1  . rosuvastatin (CRESTOR) 10 MG tablet Take 1 tablet (10 mg total) by mouth daily.     No current facility-administered medications for this visit.    PHYSICAL EXAMINATION: ECOG PERFORMANCE STATUS: 1 - Symptomatic but completely ambulatory  Vitals:   05/27/20 1040  BP: 109/64  Pulse: 91  Resp: 18  Temp: (!) 97.5 F (36.4 C)  SpO2: 100%   Filed Weights   05/27/20 1040  Weight: 144 lb 3.2 oz (65.4 kg)    LABORATORY DATA:  I have reviewed the data as listed CMP Latest Ref Rng &  Units 05/21/2020 05/07/2020 04/23/2020  Glucose 70 - 99 mg/dL 146(H) 116(H) 122(H)  BUN 8 - 23 mg/dL _0 Creatinine 0.44 - 1.00 mg/dL 0.65 0.63 0.74  Sodium 135 - 145 mmol/L 140 141 143  Potassium 3.5 - 5.1 mmol/L 3.8 3.9 3.7  Chloride 98 - 111 mmol/L 105 106 107  CO2 22 - 32 mmol/L _1 Calcium 8.9 - 10.3 mg/dL 8.7(L) 9.0 8.9  Total Protein 6.5 - 8.1 g/dL 6.4(L) 6.5 6.7  Total Bilirubin 0.3 - 1.2 mg/dL 0.3 0.2(L) 0.3  Alkaline Phos 38 -  126 U/L 110 105 121  AST 15 - 41 U/L 11(L) 13(L) 15  ALT 0 - 44 U/L _2 Lab Results  Component Value Date   WBC 7.9 05/27/2020   HGB 8.8 (L) 05/27/2020   HCT 25.7 (L) 05/27/2020   MCV 90.8 05/27/2020   PLT 292 05/27/2020   NEUTROABS 6.3 05/27/2020    ASSESSMENT & PLAN:  Malignant neoplasm of lower-outer quadrant of right breast of female, estrogen receptor positive ([place]) 08/01/2019:Palpable right breast mass for 1 month. Mammogram revealed a 5 cm mass 8:30 position, 3 cm intramammary lymph node at 10 o'clock position and 2 enlarged right axillary lymph nodes. Biopsy revealed grade 1 IDC ER 90%, PR 10%, Ki-67 10%, HER-2 negative. Intramammary node and axillary lymph node both positive with similar prognostic profile  Breast MRI 08/15/2019: Biopsy-proven malignancy lower outer quadrant right breast, non-mass enhancement spanning 8.7 cm with probable invasion of underlying pectoral muscle. Biopsy-proven metastatic lymph node in the right axillary tail. Mild asymmetric right infraclavicular adenopathy.Biopsy positive for invasive ductal carcinoma grade 1  CT CAP 08/24/2019: Right axillary and right retropectoral adenopathy compatible with nodal metastases. Indistinct sclerotic 1.2 cm left acetabular lesion. No other distant metastases MammaPrint: Luminal type A, low risk, no benefit to chemotherapy Bone scan: Negative for metastatic disease  TreatmentSummary: 1.Neoadjuvant antiestrogen therapy with anastrozole 1 mg daily started 08/08/2019 2.bilateral mastectomies1/13/2022: Right mastectomy with ALND: IDC 6.5 cm, 7/8 lymph nodes positive;left mastectomy: Benign ER 90%, PR 10%, Ki-67 10%, HER2 negative 3.given the fact that the patient had 7 positive lymph nodes, the standard of care would be to offer adjuvant chemotherapy. I recommended dose dense Adriamycin and Cytoxan followed by Taxol. 4.Adjradiation 5.Followed by adjuvant antiestrogen therapy with the CDK  4 and 6 inhibitor ----------------------------------------------------------------------------------------------------------------------------- Current Treatment: Completed 4 cycles ofDose dense Adriamycin and Cytoxan, Today is cycle 2 Taxol  Chemo Toxicities: 1. Fatigue: Progressively worsening with each cycle of chemo 2. Chemo induced anemia:  Today's hemoglobin is 8.8 3.  We will decrease the dosage of Decadron that she gets with treatment of 10 mg daily. Monitoring closely for neuropathy.  Return to clinic weekly for Taxol and every other week for follow-up with me.    No orders of the defined types were placed in this encounter.  The patient has a good understanding of the overall plan. she agrees with it. she will call with any problems that may develop before the next visit here.  Total time spent: 30 mins including face to face time and time spent for planning, charting and coordination of care  Rulon Eisenmenger, MD, MPH 05/27/2020  I, Molly Dorshimer, am acting as scribe for Dr. [name].  I have reviewed the above documentation for accuracy and completeness, and I agree with the above.

## 2020-05-27 ENCOUNTER — Encounter: Payer: Self-pay | Admitting: *Deleted

## 2020-05-27 ENCOUNTER — Inpatient Hospital Stay: Payer: Commercial Managed Care - PPO

## 2020-05-27 ENCOUNTER — Inpatient Hospital Stay (HOSPITAL_BASED_OUTPATIENT_CLINIC_OR_DEPARTMENT_OTHER): Payer: Commercial Managed Care - PPO | Admitting: Hematology and Oncology

## 2020-05-27 ENCOUNTER — Other Ambulatory Visit: Payer: Self-pay

## 2020-05-27 DIAGNOSIS — C50511 Malignant neoplasm of lower-outer quadrant of right female breast: Secondary | ICD-10-CM

## 2020-05-27 DIAGNOSIS — Z170 Estrogen receptor positive status [ER+]: Secondary | ICD-10-CM | POA: Diagnosis not present

## 2020-05-27 DIAGNOSIS — Z95828 Presence of other vascular implants and grafts: Secondary | ICD-10-CM

## 2020-05-27 DIAGNOSIS — Z5111 Encounter for antineoplastic chemotherapy: Secondary | ICD-10-CM | POA: Diagnosis not present

## 2020-05-27 LAB — CBC WITH DIFFERENTIAL (CANCER CENTER ONLY)
Abs Immature Granulocytes: 0.18 10*3/uL — ABNORMAL HIGH (ref 0.00–0.07)
Basophils Absolute: 0.1 10*3/uL (ref 0.0–0.1)
Basophils Relative: 1 %
Eosinophils Absolute: 0 10*3/uL (ref 0.0–0.5)
Eosinophils Relative: 0 %
HCT: 25.7 % — ABNORMAL LOW (ref 36.0–46.0)
Hemoglobin: 8.8 g/dL — ABNORMAL LOW (ref 12.0–15.0)
Immature Granulocytes: 2 %
Lymphocytes Relative: 10 %
Lymphs Abs: 0.8 10*3/uL (ref 0.7–4.0)
MCH: 31.1 pg (ref 26.0–34.0)
MCHC: 34.2 g/dL (ref 30.0–36.0)
MCV: 90.8 fL (ref 80.0–100.0)
Monocytes Absolute: 0.6 10*3/uL (ref 0.1–1.0)
Monocytes Relative: 7 %
Neutro Abs: 6.3 10*3/uL (ref 1.7–7.7)
Neutrophils Relative %: 80 %
Platelet Count: 292 10*3/uL (ref 150–400)
RBC: 2.83 MIL/uL — ABNORMAL LOW (ref 3.87–5.11)
RDW: 17.3 % — ABNORMAL HIGH (ref 11.5–15.5)
WBC Count: 7.9 10*3/uL (ref 4.0–10.5)
nRBC: 0.3 % — ABNORMAL HIGH (ref 0.0–0.2)

## 2020-05-27 LAB — CMP (CANCER CENTER ONLY)
ALT: 16 U/L (ref 0–44)
AST: 14 U/L — ABNORMAL LOW (ref 15–41)
Albumin: 3.9 g/dL (ref 3.5–5.0)
Alkaline Phosphatase: 68 U/L (ref 38–126)
Anion gap: 10 (ref 5–15)
BUN: 13 mg/dL (ref 8–23)
CO2: 24 mmol/L (ref 22–32)
Calcium: 8.8 mg/dL — ABNORMAL LOW (ref 8.9–10.3)
Chloride: 105 mmol/L (ref 98–111)
Creatinine: 0.62 mg/dL (ref 0.44–1.00)
GFR, Estimated: >60 mL/min (ref >60)
Glucose, Bld: 136 mg/dL — ABNORMAL HIGH (ref 70–99)
Potassium: 3.8 mmol/L (ref 3.5–5.1)
Sodium: 139 mmol/L (ref 135–145)
Total Bilirubin: 0.3 mg/dL (ref 0.3–1.2)
Total Protein: 6.3 g/dL — ABNORMAL LOW (ref 6.5–8.1)

## 2020-05-27 MED ORDER — DIPHENHYDRAMINE HCL 50 MG/ML IJ SOLN
50.0000 mg | Freq: Once | INTRAMUSCULAR | Status: AC
  Administered 2020-05-27: 50 mg via INTRAVENOUS

## 2020-05-27 MED ORDER — DIPHENHYDRAMINE HCL 50 MG/ML IJ SOLN
INTRAMUSCULAR | Status: AC
  Filled 2020-05-27: qty 1

## 2020-05-27 MED ORDER — SODIUM CHLORIDE 0.9 % IV SOLN
10.0000 mg | Freq: Once | INTRAVENOUS | Status: AC
  Administered 2020-05-27: 10 mg via INTRAVENOUS
  Filled 2020-05-27: qty 10

## 2020-05-27 MED ORDER — FAMOTIDINE IN NACL 20-0.9 MG/50ML-% IV SOLN
INTRAVENOUS | Status: AC
  Filled 2020-05-27: qty 50

## 2020-05-27 MED ORDER — SODIUM CHLORIDE 0.9 % IV SOLN
Freq: Once | INTRAVENOUS | Status: AC
  Filled 2020-05-27: qty 250

## 2020-05-27 MED ORDER — FAMOTIDINE IN NACL 20-0.9 MG/50ML-% IV SOLN
20.0000 mg | Freq: Once | INTRAVENOUS | Status: AC
  Administered 2020-05-27: 20 mg via INTRAVENOUS

## 2020-05-27 MED ORDER — SODIUM CHLORIDE 0.9% FLUSH
10.0000 mL | Freq: Once | INTRAVENOUS | Status: AC
  Administered 2020-05-27: 10 mL via INTRAVENOUS
  Filled 2020-05-27: qty 10

## 2020-05-27 MED ORDER — SODIUM CHLORIDE 0.9 % IV SOLN
80.0000 mg/m2 | Freq: Once | INTRAVENOUS | Status: AC
  Administered 2020-05-27: 144 mg via INTRAVENOUS
  Filled 2020-05-27: qty 24

## 2020-05-27 NOTE — Patient Instructions (Signed)

## 2020-05-27 NOTE — Patient Instructions (Signed)
[facility] Discharge Instructions for Patients Receiving Chemotherapy  Today you received the following chemotherapy agents: paclitaxel.  To help prevent nausea and vomiting after your treatment, we encourage you to take your nausea medication as directed.   If you develop nausea and vomiting that is not controlled by your nausea medication, call the clinic.   BELOW ARE SYMPTOMS THAT SHOULD BE REPORTED IMMEDIATELY:  *FEVER GREATER THAN 100.5 F  *CHILLS WITH OR WITHOUT FEVER  NAUSEA AND VOMITING THAT IS NOT CONTROLLED WITH YOUR NAUSEA MEDICATION  *UNUSUAL SHORTNESS OF BREATH  *UNUSUAL BRUISING OR BLEEDING  TENDERNESS IN MOUTH AND THROAT WITH OR WITHOUT PRESENCE OF ULCERS  *URINARY PROBLEMS  *BOWEL PROBLEMS  UNUSUAL RASH Items with * indicate a potential emergency and should be followed up as soon as possible.  Feel free to call the clinic should you have any questions or concerns. The clinic phone number is [phone].  Please show the CHEMO ALERT CARD at check-in to the Emergency Department and triage nurse.   

## 2020-05-27 NOTE — Assessment & Plan Note (Signed)
08/01/2019:Palpable right breast mass for 1 month. Mammogram revealed a 5 cm mass 8:30 position, 3 cm intramammary lymph node at 10 o'clock position and 2 enlarged right axillary lymph nodes. Biopsy revealed grade 1 IDC ER 90%, PR 10%, Ki-67 10%, HER-2 negative. Intramammary node and axillary lymph node both positive with similar prognostic profile  Breast MRI 08/15/2019: Biopsy-proven malignancy lower outer quadrant right breast, non-mass enhancement spanning 8.7 cm with probable invasion of underlying pectoral muscle. Biopsy-proven metastatic lymph node in the right axillary tail. Mild asymmetric right infraclavicular adenopathy.Biopsy positive for invasive ductal carcinoma grade 1  CT CAP 08/24/2019: Right axillary and right retropectoral adenopathy compatible with nodal metastases. Indistinct sclerotic 1.2 cm left acetabular lesion. No other distant metastases MammaPrint: Luminal type A, low risk, no benefit to chemotherapy Bone scan: Negative for metastatic disease  TreatmentSummary: 1.Neoadjuvant antiestrogen therapy with anastrozole 1 mg daily started 08/08/2019 2.bilateral mastectomies1/13/2022: Right mastectomy with ALND: IDC 6.5 cm, 7/8 lymph nodes positive;left mastectomy: Benign ER 90%, PR 10%, Ki-67 10%, HER2 negative 3.given the fact that the patient had 7 positive lymph nodes, the standard of care would be to offer adjuvant chemotherapy. I recommended dose dense Adriamycin and Cytoxan followed by Taxol. 4.Adjradiation 5.Followed by adjuvant antiestrogen therapy with the CDK 4 and 6 inhibitor ----------------------------------------------------------------------------------------------------------------------------- Current Treatment: Completed 4 cycles ofDose dense Adriamycin and Cytoxan, Today is cycle 2 Taxol  Chemo Toxicities: 1. Fatigue: Progressively worsening with each cycle of chemo 2. Chemo induced anemia:   Return to clinic weekly for Taxol and  every other week for follow-up with me.

## 2020-05-30 ENCOUNTER — Telehealth: Payer: Self-pay | Admitting: Hematology and Oncology

## 2020-05-30 NOTE — Telephone Encounter (Signed)
Scheduled per 4/12 los. Pt receive an updated appt calendar per next appt notes

## 2020-06-04 ENCOUNTER — Inpatient Hospital Stay: Payer: Commercial Managed Care - PPO

## 2020-06-04 ENCOUNTER — Other Ambulatory Visit: Payer: Self-pay

## 2020-06-04 VITALS — BP 113/65 | HR 75 | Temp 97.5°F | Resp 18 | Wt 149.5 lb

## 2020-06-04 DIAGNOSIS — C50511 Malignant neoplasm of lower-outer quadrant of right female breast: Secondary | ICD-10-CM

## 2020-06-04 DIAGNOSIS — Z5111 Encounter for antineoplastic chemotherapy: Secondary | ICD-10-CM | POA: Diagnosis not present

## 2020-06-04 DIAGNOSIS — Z95828 Presence of other vascular implants and grafts: Secondary | ICD-10-CM

## 2020-06-04 DIAGNOSIS — Z170 Estrogen receptor positive status [ER+]: Secondary | ICD-10-CM

## 2020-06-04 LAB — CBC WITH DIFFERENTIAL (CANCER CENTER ONLY)
Abs Immature Granulocytes: 0.03 10*3/uL (ref 0.00–0.07)
Basophils Absolute: 0.1 10*3/uL (ref 0.0–0.1)
Basophils Relative: 1 %
Eosinophils Absolute: 0 10*3/uL (ref 0.0–0.5)
Eosinophils Relative: 1 %
HCT: 25.4 % — ABNORMAL LOW (ref 36.0–46.0)
Hemoglobin: 8.6 g/dL — ABNORMAL LOW (ref 12.0–15.0)
Immature Granulocytes: 1 %
Lymphocytes Relative: 13 %
Lymphs Abs: 0.8 10*3/uL (ref 0.7–4.0)
MCH: 31.4 pg (ref 26.0–34.0)
MCHC: 33.9 g/dL (ref 30.0–36.0)
MCV: 92.7 fL (ref 80.0–100.0)
Monocytes Absolute: 0.7 10*3/uL (ref 0.1–1.0)
Monocytes Relative: 11 %
Neutro Abs: 4.4 10*3/uL (ref 1.7–7.7)
Neutrophils Relative %: 73 %
Platelet Count: 236 10*3/uL (ref 150–400)
RBC: 2.74 MIL/uL — ABNORMAL LOW (ref 3.87–5.11)
RDW: 18.6 % — ABNORMAL HIGH (ref 11.5–15.5)
WBC Count: 6 10*3/uL (ref 4.0–10.5)
nRBC: 0 % (ref 0.0–0.2)

## 2020-06-04 LAB — CMP (CANCER CENTER ONLY)
ALT: 21 U/L (ref 0–44)
AST: 16 U/L (ref 15–41)
Albumin: 3.9 g/dL (ref 3.5–5.0)
Alkaline Phosphatase: 54 U/L (ref 38–126)
Anion gap: 10 (ref 5–15)
BUN: 12 mg/dL (ref 8–23)
CO2: 24 mmol/L (ref 22–32)
Calcium: 8.8 mg/dL — ABNORMAL LOW (ref 8.9–10.3)
Chloride: 105 mmol/L (ref 98–111)
Creatinine: 0.7 mg/dL (ref 0.44–1.00)
GFR, Estimated: >60 mL/min (ref >60)
Glucose, Bld: 137 mg/dL — ABNORMAL HIGH (ref 70–99)
Potassium: 4 mmol/L (ref 3.5–5.1)
Sodium: 139 mmol/L (ref 135–145)
Total Bilirubin: 0.3 mg/dL (ref 0.3–1.2)
Total Protein: 6.2 g/dL — ABNORMAL LOW (ref 6.5–8.1)

## 2020-06-04 MED ORDER — FAMOTIDINE IN NACL 20-0.9 MG/50ML-% IV SOLN
INTRAVENOUS | Status: AC
  Filled 2020-06-04: qty 50

## 2020-06-04 MED ORDER — DIPHENHYDRAMINE HCL 50 MG/ML IJ SOLN
INTRAMUSCULAR | Status: AC
  Filled 2020-06-04: qty 1

## 2020-06-04 MED ORDER — HEPARIN SOD (PORK) LOCK FLUSH 100 UNIT/ML IV SOLN
500.0000 [IU] | Freq: Once | INTRAVENOUS | Status: AC | PRN
Start: 2020-06-04 — End: 2020-06-04
  Administered 2020-06-04: 500 [IU]
  Filled 2020-06-04: qty 5

## 2020-06-04 MED ORDER — SODIUM CHLORIDE 0.9% FLUSH
10.0000 mL | INTRAVENOUS | Status: DC | PRN
  Administered 2020-06-04: 10 mL
  Filled 2020-06-04: qty 10

## 2020-06-04 MED ORDER — DIPHENHYDRAMINE HCL 50 MG/ML IJ SOLN
50.0000 mg | Freq: Once | INTRAMUSCULAR | Status: AC
  Administered 2020-06-04: 50 mg via INTRAVENOUS

## 2020-06-04 MED ORDER — PACLITAXEL CHEMO INJECTION 300 MG/50ML
80.0000 mg/m2 | Freq: Once | INTRAVENOUS | Status: AC
  Administered 2020-06-04: 144 mg via INTRAVENOUS
  Filled 2020-06-04: qty 24

## 2020-06-04 MED ORDER — SODIUM CHLORIDE 0.9% FLUSH
10.0000 mL | INTRAVENOUS | Status: DC | PRN
  Administered 2020-06-04: 10 mL via INTRAVENOUS
  Filled 2020-06-04: qty 10

## 2020-06-04 MED ORDER — SODIUM CHLORIDE 0.9 % IV SOLN
Freq: Once | INTRAVENOUS | Status: AC
Start: 2020-06-04 — End: 2020-06-04
  Filled 2020-06-04: qty 250

## 2020-06-04 MED ORDER — SODIUM CHLORIDE 0.9 % IV SOLN
10.0000 mg | Freq: Once | INTRAVENOUS | Status: AC
  Administered 2020-06-04: 10 mg via INTRAVENOUS
  Filled 2020-06-04: qty 10

## 2020-06-04 MED ORDER — FAMOTIDINE IN NACL 20-0.9 MG/50ML-% IV SOLN
20.0000 mg | Freq: Once | INTRAVENOUS | Status: AC
  Administered 2020-06-04: 20 mg via INTRAVENOUS

## 2020-06-04 NOTE — Patient Instructions (Signed)
[facility] Discharge Instructions for Patients Receiving Chemotherapy  Today you received the following chemotherapy agents: paclitaxel.  To help prevent nausea and vomiting after your treatment, we encourage you to take your nausea medication as directed.   If you develop nausea and vomiting that is not controlled by your nausea medication, call the clinic.   BELOW ARE SYMPTOMS THAT SHOULD BE REPORTED IMMEDIATELY:  *FEVER GREATER THAN 100.5 F  *CHILLS WITH OR WITHOUT FEVER  NAUSEA AND VOMITING THAT IS NOT CONTROLLED WITH YOUR NAUSEA MEDICATION  *UNUSUAL SHORTNESS OF BREATH  *UNUSUAL BRUISING OR BLEEDING  TENDERNESS IN MOUTH AND THROAT WITH OR WITHOUT PRESENCE OF ULCERS  *URINARY PROBLEMS  *BOWEL PROBLEMS  UNUSUAL RASH Items with * indicate a potential emergency and should be followed up as soon as possible.  Feel free to call the clinic should you have any questions or concerns. The clinic phone number is [phone].  Please show the CHEMO ALERT CARD at check-in to the Emergency Department and triage nurse.   

## 2020-06-04 NOTE — Patient Instructions (Signed)
Implanted Port Insertion, Care After This sheet gives you information about how to care for yourself after your procedure. Your health care provider may also give you more specific instructions. If you have problems or questions, contact your health care provider. What can I expect after the procedure? After the procedure, it is common to have:  Discomfort at the port insertion site.  Bruising on the skin over the port. This should improve over 3-4 days. Follow these instructions at home: Port care  After your port is placed, you will get a manufacturer's information card. The card has information about your port. Keep this card with you at all times.  Take care of the port as told by your health care provider. Ask your health care provider if you or a family member can get training for taking care of the port at home. A home health care nurse may also take care of the port.  Make sure to remember what type of port you have. Incision care  Follow instructions from your health care provider about how to take care of your port insertion site. Make sure you: ? Wash your hands with soap and water before and after you change your bandage (dressing). If soap and water are not available, use hand sanitizer. ? Change your dressing as told by your health care provider. ? Leave stitches (sutures), skin glue, or adhesive strips in place. These skin closures may need to stay in place for 2 weeks or longer. If adhesive strip edges start to loosen and curl up, you may trim the loose edges. Do not remove adhesive strips completely unless your health care provider tells you to do that.  Check your port insertion site every day for signs of infection. Check for: ? Redness, swelling, or pain. ? Fluid or blood. ? Warmth. ? Pus or a bad smell.      Activity  Return to your normal activities as told by your health care provider. Ask your health care provider what activities are safe for you.  Do not  lift anything that is heavier than 10 lb (4.5 kg), or the limit that you are told, until your health care provider says that it is safe. General instructions  Take over-the-counter and prescription medicines only as told by your health care provider.  Do not take baths, swim, or use a hot tub until your health care provider approves. Ask your health care provider if you may take showers. You may only be allowed to take sponge baths.  Do not drive for 24 hours if you were given a sedative during your procedure.  Wear a medical alert bracelet in case of an emergency. This will tell any health care providers that you have a port.  Keep all follow-up visits as told by your health care provider. This is important. Contact a health care provider if:  You cannot flush your port with saline as directed, or you cannot draw blood from the port.  You have a fever or chills.  You have redness, swelling, or pain around your port insertion site.  You have fluid or blood coming from your port insertion site.  Your port insertion site feels warm to the touch.  You have pus or a bad smell coming from the port insertion site. Get help right away if:  You have chest pain or shortness of breath.  You have bleeding from your port that you cannot control. Summary  Take care of the port as told by your   health care provider. Keep the manufacturer's information card with you at all times.  Change your dressing as told by your health care provider.  Contact a health care provider if you have a fever or chills or if you have redness, swelling, or pain around your port insertion site.  Keep all follow-up visits as told by your health care provider. This information is not intended to replace advice given to you by your health care provider. Make sure you discuss any questions you have with your health care provider. Document Revised: 08/30/2017 Document Reviewed: 08/30/2017 Elsevier Patient Education   2021 [employer]  

## 2020-06-09 ENCOUNTER — Encounter: Payer: Self-pay | Admitting: *Deleted

## 2020-06-10 MED FILL — Dexamethasone Sodium Phosphate Inj 100 MG/10ML: INTRAMUSCULAR | Qty: 1 | Status: AC

## 2020-06-10 NOTE — Progress Notes (Signed)
Patient Care Team: Deland Pretty, MD as PCP - General (Internal Medicine) Mauro Kaufmann, RN as Medical Oncologist Rockwell Germany, RN as Oncology Nurse Navigator  DIAGNOSIS:    ICD-10-CM   1. Malignant neoplasm of lower-outer quadrant of right breast of female, estrogen receptor positive ([place])  C50.511    Z17.0     SUMMARY OF ONCOLOGIC HISTORY: Oncology History  Malignant neoplasm of lower-outer quadrant of right breast of female, estrogen receptor positive ([place])  08/01/2019 Initial Diagnosis   Palpable right breast mass for 1 month.  Mammogram revealed a 5 cm mass 8:30 position, 3 cm intramammary lymph node at 10 o'clock position and 2 enlarged right axillary lymph nodes.  Biopsy revealed grade 1 IDC ER 90%, PR 10%, Ki-67 10%, HER-2 negative.  Intramammary node and axillary lymph node both positive with similar prognostic profile   08/08/2019 -  Neo-Adjuvant Anti-estrogen oral therapy   Anastrozole while deciding treatment plan    02/28/2020 Surgery   Bilateral mastectomies [name]):  Right breast: IDC, 6.5cm, metastatic carcinoma in 6/7 right axillary lymph nodes, 1.5cm, 1.5cm, and 0.3cm, and one intramammary lymph node, 2.5cm Left breast: no evidence of malignancy in the breast or 1 left axillary lymph node.   03/26/2020 -  Chemotherapy    Patient is on Treatment Plan: BREAST ADJUVANT DOSE DENSE AC Q14D / PACLITAXEL Q7D        CHIEF COMPLIANT: Cycle 4Taxol  INTERVAL HISTORY: LOGYN DEDOMINICIS is a 65 y.o. with above-mentioned history of right breast cancertreated withneoadjuvant antiestrogen therapy,bilateral mastectomies, and who is currently on adjuvant chemotherapy withTaxol after completing four cycles ofdose dense Adriamcyin and Cytoxan. She presents today for a toxicity checkand treatment.  ALLERGIES:  has No Known Allergies.  MEDICATIONS:  Current Outpatient Medications  Medication Sig Dispense Refill  . LORazepam (ATIVAN) 0.5 MG tablet Take 1 tablet (0.5 mg  total) by mouth every 8 (eight) hours. 10 tablet 0  . calcium carbonate (OS-CAL) 1250 (500 Ca) MG chewable tablet Chew 1 tablet by mouth daily.    . cholecalciferol (VITAMIN D3) 25 MCG (1000 UNIT) tablet Take 1,000 Units by mouth daily.    Marland Kitchen levothyroxine (SYNTHROID) 125 MCG tablet Take 1 tablet (125 mcg total) by mouth daily before breakfast.    . lidocaine-prilocaine (EMLA) cream Apply to affected area once 30 g 3  . Multiple Vitamins-Minerals (MULTIVITAMIN WITH MINERALS) tablet Take 1 tablet by mouth daily.    . ondansetron (ZOFRAN) 8 MG tablet Take 1 tablet (8 mg total) by mouth 2 (two) times daily as needed. Start on the third day after chemotherapy. 30 tablet 1  . prochlorperazine (COMPAZINE) 10 MG tablet Take 1 tablet (10 mg total) by mouth every 6 (six) hours as needed (Nausea or vomiting). 30 tablet 1  . rosuvastatin (CRESTOR) 10 MG tablet Take 1 tablet (10 mg total) by mouth daily.     No current facility-administered medications for this visit.   Facility-Administered Medications Ordered in Other Visits  Medication Dose Route Frequency Provider Last Rate Last Admin  . heparin lock flush 100 unit/mL  500 Units Intracatheter Once PRN [name], MD      . PACLitaxel (TAXOL) 144 mg in sodium chloride 0.9 % 250 mL chemo infusion (</= 36m/m2)  80 mg/m2 (Treatment Plan Recorded) Intravenous Once GNicholas Lose MD 274 mL/hr at 06/11/20 1527 144 mg at 06/11/20 1527  . sodium chloride flush (NS) 0.9 % injection 10 mL  10 mL Intracatheter PRN GNicholas Lose MD  PHYSICAL EXAMINATION: ECOG PERFORMANCE STATUS: 1 - Symptomatic but completely ambulatory  Vitals:   06/11/20 1245  BP: 123/71  Pulse: (!) 109  Resp: 18  Temp: 98.1 F (36.7 C)  SpO2: 100%   Filed Weights   06/11/20 1245  Weight: 147 lb 4.8 oz (66.8 kg)    LABORATORY DATA:  I have reviewed the data as listed CMP Latest Ref Rng & Units 06/11/2020 06/04/2020 05/27/2020  Glucose 70 - 99 mg/dL 117(H) 137(H) 136(H)   BUN 8 - 23 mg/dL '12 12 13  ' Creatinine 0.44 - 1.00 mg/dL 0.73 0.70 0.62  Sodium 135 - 145 mmol/L 139 139 139  Potassium 3.5 - 5.1 mmol/L 4.2 4.0 3.8  Chloride 98 - 111 mmol/L 104 105 105  CO2 22 - 32 mmol/L '26 24 24  ' Calcium 8.9 - 10.3 mg/dL 9.0 8.8(L) 8.8(L)  Total Protein 6.5 - 8.1 g/dL 6.5 6.2(L) 6.3(L)  Total Bilirubin 0.3 - 1.2 mg/dL 0.5 0.3 0.3  Alkaline Phos 38 - 126 U/L 61 54 68  AST 15 - 41 U/L 17 16 14(L)  ALT 0 - 44 U/L '21 21 16    ' Lab Results  Component Value Date   WBC 7.9 06/11/2020   HGB 9.3 (L) 06/11/2020   HCT 26.5 (L) 06/11/2020   MCV 93.0 06/11/2020   PLT 197 06/11/2020   NEUTROABS 6.4 06/11/2020    ASSESSMENT & PLAN:  Malignant neoplasm of lower-outer quadrant of right breast of female, estrogen receptor positive ([place]) 08/01/2019:Palpable right breast mass for 1 month. Mammogram revealed a 5 cm mass 8:30 position, 3 cm intramammary lymph node at 10 o'clock position and 2 enlarged right axillary lymph nodes. Biopsy revealed grade 1 IDC ER 90%, PR 10%, Ki-67 10%, HER-2 negative. Intramammary node and axillary lymph node both positive with similar prognostic profile  Breast MRI 08/15/2019: Biopsy-proven malignancy lower outer quadrant right breast, non-mass enhancement spanning 8.7 cm with probable invasion of underlying pectoral muscle. Biopsy-proven metastatic lymph node in the right axillary tail. Mild asymmetric right infraclavicular adenopathy.Biopsy positive for invasive ductal carcinoma grade 1  CT CAP 08/24/2019: Right axillary and right retropectoral adenopathy compatible with nodal metastases. Indistinct sclerotic 1.2 cm left acetabular lesion. No other distant metastases MammaPrint: Luminal type A, low risk, no benefit to chemotherapy Bone scan: Negative for metastatic disease  TreatmentSummary: 1.Neoadjuvant antiestrogen therapy with anastrozole 1 mg daily started 08/08/2019 2.bilateral mastectomies1/13/2022: Right mastectomy with ALND:  IDC 6.5 cm, 7/8 lymph nodes positive;left mastectomy: Benign ER 90%, PR 10%, Ki-67 10%, HER2 negative 3.given the fact that the patient had 7 positive lymph nodes, the standard of care would be to offer adjuvant chemotherapy. I recommended dose dense Adriamycin and Cytoxan followed by Taxol. 4.Adjradiation 5.Followed by adjuvant antiestrogen therapy with the CDK 4 and 6 inhibitor ----------------------------------------------------------------------------------------------------------------------------- Current Treatment:Completed 4 cycles ofDose dense Adriamycin and Cytoxan, Today is cycle 4 Taxol  Chemo Toxicities: 1.Fatigue: Progressively worsening with each cycle of chemo 2.Chemo induced anemia: Today's hemoglobin is 9.4 3.  We will decrease the dosage of Decadron that she gets with treatment of 4 mg daily. Monitoring closely for neuropathy.  Return to clinic weekly for Taxol and every other week for follow-up with me.     No orders of the defined types were placed in this encounter.  The patient has a good understanding of the overall plan. she agrees with it. she will call with any problems that may develop before the next visit here.  Total time spent: 30 mins including face  to face time and time spent for planning, charting and coordination of care  Rulon Eisenmenger, MD, MPH 06/11/2020  I, Molly Dorshimer, am acting as scribe for Dr. [name].  I have reviewed the above documentation for accuracy and completeness, and I agree with the above.

## 2020-06-10 NOTE — Assessment & Plan Note (Addendum)
08/01/2019:Palpable right breast mass for 1 month. Mammogram revealed a 5 cm mass 8:30 position, 3 cm intramammary lymph node at 10 o'clock position and 2 enlarged right axillary lymph nodes. Biopsy revealed grade 1 IDC ER 90%, PR 10%, Ki-67 10%, HER-2 negative. Intramammary node and axillary lymph node both positive with similar prognostic profile  Breast MRI 08/15/2019: Biopsy-proven malignancy lower outer quadrant right breast, non-mass enhancement spanning 8.7 cm with probable invasion of underlying pectoral muscle. Biopsy-proven metastatic lymph node in the right axillary tail. Mild asymmetric right infraclavicular adenopathy.Biopsy positive for invasive ductal carcinoma grade 1  CT CAP 08/24/2019: Right axillary and right retropectoral adenopathy compatible with nodal metastases. Indistinct sclerotic 1.2 cm left acetabular lesion. No other distant metastases MammaPrint: Luminal type A, low risk, no benefit to chemotherapy Bone scan: Negative for metastatic disease  TreatmentSummary: 1.Neoadjuvant antiestrogen therapy with anastrozole 1 mg daily started 08/08/2019 2.bilateral mastectomies1/13/2022: Right mastectomy with ALND: IDC 6.5 cm, 7/8 lymph nodes positive;left mastectomy: Benign ER 90%, PR 10%, Ki-67 10%, HER2 negative 3.given the fact that the patient had 7 positive lymph nodes, the standard of care would be to offer adjuvant chemotherapy. I recommended dose dense Adriamycin and Cytoxan followed by Taxol. 4.Adjradiation 5.Followed by adjuvant antiestrogen therapy with the CDK 4 and 6 inhibitor ----------------------------------------------------------------------------------------------------------------------------- Current Treatment:Completed 4 cycles ofDose dense Adriamycin and Cytoxan, Today is cycle 4 Taxol  Chemo Toxicities: 1.Fatigue: Progressively worsening with each cycle of chemo 2.Chemo induced anemia: Today's hemoglobin is 9.4 3.  We will  decrease the dosage of Decadron that she gets with treatment of 4 mg daily. Monitoring closely for neuropathy.  Return to clinic weekly for Taxol and every other week for follow-up with me.

## 2020-06-11 ENCOUNTER — Inpatient Hospital Stay: Payer: Commercial Managed Care - PPO

## 2020-06-11 ENCOUNTER — Other Ambulatory Visit: Payer: Commercial Managed Care - PPO

## 2020-06-11 ENCOUNTER — Inpatient Hospital Stay (HOSPITAL_BASED_OUTPATIENT_CLINIC_OR_DEPARTMENT_OTHER): Payer: Commercial Managed Care - PPO | Admitting: Hematology and Oncology

## 2020-06-11 ENCOUNTER — Other Ambulatory Visit: Payer: Self-pay

## 2020-06-11 VITALS — HR 98

## 2020-06-11 DIAGNOSIS — C50511 Malignant neoplasm of lower-outer quadrant of right female breast: Secondary | ICD-10-CM

## 2020-06-11 DIAGNOSIS — Z170 Estrogen receptor positive status [ER+]: Secondary | ICD-10-CM | POA: Diagnosis not present

## 2020-06-11 DIAGNOSIS — Z5111 Encounter for antineoplastic chemotherapy: Secondary | ICD-10-CM | POA: Diagnosis not present

## 2020-06-11 DIAGNOSIS — Z95828 Presence of other vascular implants and grafts: Secondary | ICD-10-CM

## 2020-06-11 LAB — CBC WITH DIFFERENTIAL (CANCER CENTER ONLY)
Abs Immature Granulocytes: 0.03 10*3/uL (ref 0.00–0.07)
Basophils Absolute: 0.1 10*3/uL (ref 0.0–0.1)
Basophils Relative: 1 %
Eosinophils Absolute: 0.2 10*3/uL (ref 0.0–0.5)
Eosinophils Relative: 2 %
HCT: 26.5 % — ABNORMAL LOW (ref 36.0–46.0)
Hemoglobin: 9.3 g/dL — ABNORMAL LOW (ref 12.0–15.0)
Immature Granulocytes: 0 %
Lymphocytes Relative: 6 %
Lymphs Abs: 0.5 10*3/uL — ABNORMAL LOW (ref 0.7–4.0)
MCH: 32.6 pg (ref 26.0–34.0)
MCHC: 35.1 g/dL (ref 30.0–36.0)
MCV: 93 fL (ref 80.0–100.0)
Monocytes Absolute: 0.7 10*3/uL (ref 0.1–1.0)
Monocytes Relative: 9 %
Neutro Abs: 6.4 10*3/uL (ref 1.7–7.7)
Neutrophils Relative %: 82 %
Platelet Count: 197 10*3/uL (ref 150–400)
RBC: 2.85 MIL/uL — ABNORMAL LOW (ref 3.87–5.11)
RDW: 17 % — ABNORMAL HIGH (ref 11.5–15.5)
WBC Count: 7.9 10*3/uL (ref 4.0–10.5)
nRBC: 0 % (ref 0.0–0.2)

## 2020-06-11 LAB — CMP (CANCER CENTER ONLY)
ALT: 21 U/L (ref 0–44)
AST: 17 U/L (ref 15–41)
Albumin: 4 g/dL (ref 3.5–5.0)
Alkaline Phosphatase: 61 U/L (ref 38–126)
Anion gap: 9 (ref 5–15)
BUN: 12 mg/dL (ref 8–23)
CO2: 26 mmol/L (ref 22–32)
Calcium: 9 mg/dL (ref 8.9–10.3)
Chloride: 104 mmol/L (ref 98–111)
Creatinine: 0.73 mg/dL (ref 0.44–1.00)
GFR, Estimated: >60 mL/min (ref >60)
Glucose, Bld: 117 mg/dL — ABNORMAL HIGH (ref 70–99)
Potassium: 4.2 mmol/L (ref 3.5–5.1)
Sodium: 139 mmol/L (ref 135–145)
Total Bilirubin: 0.5 mg/dL (ref 0.3–1.2)
Total Protein: 6.5 g/dL (ref 6.5–8.1)

## 2020-06-11 MED ORDER — SODIUM CHLORIDE 0.9% FLUSH
10.0000 mL | INTRAVENOUS | Status: DC | PRN
  Administered 2020-06-11: 10 mL
  Filled 2020-06-11: qty 10

## 2020-06-11 MED ORDER — DIPHENHYDRAMINE HCL 50 MG/ML IJ SOLN
50.0000 mg | Freq: Once | INTRAMUSCULAR | Status: AC
  Administered 2020-06-11: 50 mg via INTRAVENOUS

## 2020-06-11 MED ORDER — LORAZEPAM 0.5 MG PO TABS: 0.5000 mg | ORAL_TABLET | Freq: Three times a day (TID) | ORAL | 0 refills | Status: DC

## 2020-06-11 MED ORDER — HEPARIN SOD (PORK) LOCK FLUSH 100 UNIT/ML IV SOLN
500.0000 [IU] | Freq: Once | INTRAVENOUS | Status: AC | PRN
  Administered 2020-06-11: 500 [IU]
  Filled 2020-06-11: qty 5

## 2020-06-11 MED ORDER — SODIUM CHLORIDE 0.9 % IV SOLN
Freq: Once | INTRAVENOUS | Status: AC
Start: 2020-06-11 — End: 2020-06-11
  Filled 2020-06-11: qty 250

## 2020-06-11 MED ORDER — SODIUM CHLORIDE 0.9% FLUSH
10.0000 mL | INTRAVENOUS | Status: DC | PRN
  Administered 2020-06-11: 10 mL via INTRAVENOUS
  Filled 2020-06-11: qty 10

## 2020-06-11 MED ORDER — FAMOTIDINE IN NACL 20-0.9 MG/50ML-% IV SOLN
INTRAVENOUS | Status: AC
  Filled 2020-06-11: qty 50

## 2020-06-11 MED ORDER — SODIUM CHLORIDE 0.9 % IV SOLN
80.0000 mg/m2 | Freq: Once | INTRAVENOUS | Status: AC
  Administered 2020-06-11: 144 mg via INTRAVENOUS
  Filled 2020-06-11: qty 24

## 2020-06-11 MED ORDER — DIPHENHYDRAMINE HCL 50 MG/ML IJ SOLN
INTRAMUSCULAR | Status: AC
  Filled 2020-06-11: qty 1

## 2020-06-11 MED ORDER — FAMOTIDINE IN NACL 20-0.9 MG/50ML-% IV SOLN
20.0000 mg | Freq: Once | INTRAVENOUS | Status: AC
  Administered 2020-06-11: 20 mg via INTRAVENOUS

## 2020-06-11 MED ORDER — DEXAMETHASONE SODIUM PHOSPHATE 10 MG/ML IJ SOLN
4.0000 mg | Freq: Once | INTRAMUSCULAR | Status: AC
  Administered 2020-06-11: 4 mg via INTRAVENOUS

## 2020-06-11 MED ORDER — DEXAMETHASONE SODIUM PHOSPHATE 10 MG/ML IJ SOLN
INTRAMUSCULAR | Status: AC
  Filled 2020-06-11: qty 1

## 2020-06-11 MED ORDER — SODIUM CHLORIDE 0.9 % IV SOLN: 4.0000 mg | Freq: Once | INTRAVENOUS | Status: DC

## 2020-06-11 NOTE — Patient Instructions (Signed)
Implanted Port Insertion, Care After This sheet gives you information about how to care for yourself after your procedure. Your health care provider may also give you more specific instructions. If you have problems or questions, contact your health care provider. What can I expect after the procedure? After the procedure, it is common to have:  Discomfort at the port insertion site.  Bruising on the skin over the port. This should improve over 3-4 days. Follow these instructions at home: Port care  After your port is placed, you will get a manufacturer's information card. The card has information about your port. Keep this card with you at all times.  Take care of the port as told by your health care provider. Ask your health care provider if you or a family member can get training for taking care of the port at home. A home health care nurse may also take care of the port.  Make sure to remember what type of port you have. Incision care  Follow instructions from your health care provider about how to take care of your port insertion site. Make sure you: ? Wash your hands with soap and water before and after you change your bandage (dressing). If soap and water are not available, use hand sanitizer. ? Change your dressing as told by your health care provider. ? Leave stitches (sutures), skin glue, or adhesive strips in place. These skin closures may need to stay in place for 2 weeks or longer. If adhesive strip edges start to loosen and curl up, you may trim the loose edges. Do not remove adhesive strips completely unless your health care provider tells you to do that.  Check your port insertion site every day for signs of infection. Check for: ? Redness, swelling, or pain. ? Fluid or blood. ? Warmth. ? Pus or a bad smell.      Activity  Return to your normal activities as told by your health care provider. Ask your health care provider what activities are safe for you.  Do not  lift anything that is heavier than 10 lb (4.5 kg), or the limit that you are told, until your health care provider says that it is safe. General instructions  Take over-the-counter and prescription medicines only as told by your health care provider.  Do not take baths, swim, or use a hot tub until your health care provider approves. Ask your health care provider if you may take showers. You may only be allowed to take sponge baths.  Do not drive for 24 hours if you were given a sedative during your procedure.  Wear a medical alert bracelet in case of an emergency. This will tell any health care providers that you have a port.  Keep all follow-up visits as told by your health care provider. This is important. Contact a health care provider if:  You cannot flush your port with saline as directed, or you cannot draw blood from the port.  You have a fever or chills.  You have redness, swelling, or pain around your port insertion site.  You have fluid or blood coming from your port insertion site.  Your port insertion site feels warm to the touch.  You have pus or a bad smell coming from the port insertion site. Get help right away if:  You have chest pain or shortness of breath.  You have bleeding from your port that you cannot control. Summary  Take care of the port as told by your   health care provider. Keep the manufacturer's information card with you at all times.  Change your dressing as told by your health care provider.  Contact a health care provider if you have a fever or chills or if you have redness, swelling, or pain around your port insertion site.  Keep all follow-up visits as told by your health care provider. This information is not intended to replace advice given to you by your health care provider. Make sure you discuss any questions you have with your health care provider. Document Revised: 08/30/2017 Document Reviewed: 08/30/2017 Elsevier Patient Education   2021 [employer]  

## 2020-06-12 ENCOUNTER — Encounter: Payer: Self-pay | Admitting: Hematology and Oncology

## 2020-06-18 ENCOUNTER — Inpatient Hospital Stay: Payer: Commercial Managed Care - PPO

## 2020-06-18 ENCOUNTER — Inpatient Hospital Stay: Payer: Commercial Managed Care - PPO | Attending: Hematology and Oncology

## 2020-06-18 ENCOUNTER — Other Ambulatory Visit: Payer: Commercial Managed Care - PPO

## 2020-06-18 ENCOUNTER — Other Ambulatory Visit: Payer: Self-pay

## 2020-06-18 VITALS — BP 117/71 | HR 75 | Temp 98.3°F | Resp 18 | Ht 67.0 in | Wt 146.8 lb

## 2020-06-18 DIAGNOSIS — C50511 Malignant neoplasm of lower-outer quadrant of right female breast: Secondary | ICD-10-CM

## 2020-06-18 DIAGNOSIS — Z170 Estrogen receptor positive status [ER+]: Secondary | ICD-10-CM | POA: Diagnosis not present

## 2020-06-18 DIAGNOSIS — Z5111 Encounter for antineoplastic chemotherapy: Secondary | ICD-10-CM | POA: Insufficient documentation

## 2020-06-18 DIAGNOSIS — C773 Secondary and unspecified malignant neoplasm of axilla and upper limb lymph nodes: Secondary | ICD-10-CM | POA: Diagnosis not present

## 2020-06-18 DIAGNOSIS — Z95828 Presence of other vascular implants and grafts: Secondary | ICD-10-CM | POA: Insufficient documentation

## 2020-06-18 LAB — CMP (CANCER CENTER ONLY)
ALT: 15 U/L (ref 0–44)
AST: 15 U/L (ref 15–41)
Albumin: 3.7 g/dL (ref 3.5–5.0)
Alkaline Phosphatase: 59 U/L (ref 38–126)
Anion gap: 8 (ref 5–15)
BUN: 10 mg/dL (ref 8–23)
CO2: 27 mmol/L (ref 22–32)
Calcium: 8.9 mg/dL (ref 8.9–10.3)
Chloride: 106 mmol/L (ref 98–111)
Creatinine: 0.64 mg/dL (ref 0.44–1.00)
GFR, Estimated: >60 mL/min (ref >60)
Glucose, Bld: 103 mg/dL — ABNORMAL HIGH (ref 70–99)
Potassium: 3.8 mmol/L (ref 3.5–5.1)
Sodium: 141 mmol/L (ref 135–145)
Total Bilirubin: 0.4 mg/dL (ref 0.3–1.2)
Total Protein: 6.4 g/dL — ABNORMAL LOW (ref 6.5–8.1)

## 2020-06-18 LAB — CBC WITH DIFFERENTIAL (CANCER CENTER ONLY)
Abs Immature Granulocytes: 0.04 10*3/uL (ref 0.00–0.07)
Basophils Absolute: 0.1 10*3/uL (ref 0.0–0.1)
Basophils Relative: 1 %
Eosinophils Absolute: 0.2 10*3/uL (ref 0.0–0.5)
Eosinophils Relative: 4 %
HCT: 27.1 % — ABNORMAL LOW (ref 36.0–46.0)
Hemoglobin: 9.3 g/dL — ABNORMAL LOW (ref 12.0–15.0)
Immature Granulocytes: 1 %
Lymphocytes Relative: 22 %
Lymphs Abs: 1 10*3/uL (ref 0.7–4.0)
MCH: 32 pg (ref 26.0–34.0)
MCHC: 34.3 g/dL (ref 30.0–36.0)
MCV: 93.1 fL (ref 80.0–100.0)
Monocytes Absolute: 0.4 10*3/uL (ref 0.1–1.0)
Monocytes Relative: 9 %
Neutro Abs: 2.8 10*3/uL (ref 1.7–7.7)
Neutrophils Relative %: 63 %
Platelet Count: 225 10*3/uL (ref 150–400)
RBC: 2.91 MIL/uL — ABNORMAL LOW (ref 3.87–5.11)
RDW: 15.5 % (ref 11.5–15.5)
WBC Count: 4.5 10*3/uL (ref 4.0–10.5)
nRBC: 0 % (ref 0.0–0.2)

## 2020-06-18 MED ORDER — DEXAMETHASONE SODIUM PHOSPHATE 10 MG/ML IJ SOLN
INTRAMUSCULAR | Status: AC
  Filled 2020-06-18: qty 1

## 2020-06-18 MED ORDER — SODIUM CHLORIDE 0.9 % IV SOLN
Freq: Once | INTRAVENOUS | Status: AC
Start: 2020-06-18 — End: 2020-06-18
  Filled 2020-06-18: qty 250

## 2020-06-18 MED ORDER — SODIUM CHLORIDE 0.9% FLUSH
10.0000 mL | Freq: Once | INTRAVENOUS | Status: AC
  Administered 2020-06-18: 10 mL
  Filled 2020-06-18: qty 10

## 2020-06-18 MED ORDER — DIPHENHYDRAMINE HCL 50 MG/ML IJ SOLN
INTRAMUSCULAR | Status: AC
  Filled 2020-06-18: qty 1

## 2020-06-18 MED ORDER — DEXAMETHASONE SODIUM PHOSPHATE 10 MG/ML IJ SOLN
4.0000 mg | Freq: Once | INTRAMUSCULAR | Status: AC
  Administered 2020-06-18: 4 mg via INTRAVENOUS

## 2020-06-18 MED ORDER — FAMOTIDINE 20 MG IN NS 100 ML IVPB
20.0000 mg | Freq: Once | INTRAVENOUS | Status: AC
Start: 2020-06-18 — End: 2020-06-18
  Administered 2020-06-18: 20 mg via INTRAVENOUS

## 2020-06-18 MED ORDER — HEPARIN SOD (PORK) LOCK FLUSH 100 UNIT/ML IV SOLN
500.0000 [IU] | Freq: Once | INTRAVENOUS | Status: AC | PRN
  Administered 2020-06-18: 500 [IU]
  Filled 2020-06-18: qty 5

## 2020-06-18 MED ORDER — FAMOTIDINE 20 MG IN NS 100 ML IVPB
INTRAVENOUS | Status: AC
  Filled 2020-06-18: qty 100

## 2020-06-18 MED ORDER — SODIUM CHLORIDE 0.9 % IV SOLN
80.0000 mg/m2 | Freq: Once | INTRAVENOUS | Status: AC
  Administered 2020-06-18: 144 mg via INTRAVENOUS
  Filled 2020-06-18: qty 24

## 2020-06-18 MED ORDER — SODIUM CHLORIDE 0.9% FLUSH
10.0000 mL | INTRAVENOUS | Status: DC | PRN
Start: 2020-06-18 — End: 2020-06-18
  Administered 2020-06-18: 10 mL
  Filled 2020-06-18: qty 10

## 2020-06-18 MED ORDER — DIPHENHYDRAMINE HCL 50 MG/ML IJ SOLN
50.0000 mg | Freq: Once | INTRAMUSCULAR | Status: AC
  Administered 2020-06-18: 50 mg via INTRAVENOUS

## 2020-06-18 NOTE — Patient Instructions (Signed)
[facility]ICAL [facility]COL[facility]  Discharge Instructions: °Thank you for choosing [facility] to provide your oncology and hematology care.  ° °If you have a lab appointment with the Cancer Center, please go directly to the Cancer Center and check in at the registration area. °  °Wear comfortable clothing and clothing appropriate for easy access to any Portacath or PICC line.  ° °We strive to give you quality time with your provider. You may need to reschedule your appointment if you arrive late (15 or more minutes).  Arriving late affects you and other patients whose appointments are after yours.  Also, if you miss three or more appointments without notifying the office, you may be dismissed from the clinic at the provider’s discretion.    °  °For prescription refill requests, have your pharmacy contact our office and allow 72 hours for refills to be completed.   ° °Today you received the following chemotherapy and/or immunotherapy agents: Taxol   °  °To help prevent nausea and vomiting after your treatment, we encourage you to take your nausea medication as directed. ° °BELOW ARE SYMPTOMS THAT SHOULD BE REPORTED IMMEDIATELY: °*FEVER GREATER THAN 100.4 F (38 °C) OR HIGHER °*CHILLS OR SWEATING °*NAUSEA AND VOMITING THAT IS NOT CONTROLLED WITH YOUR NAUSEA MEDICATION °*UNUSUAL SHORTNESS OF BREATH °*UNUSUAL BRUISING OR BLEEDING °*URINARY PROBLEMS (pain or burning when urinating, or frequent urination) °*BOWEL PROBLEMS (unusual diarrhea, constipation, pain near the anus) °TENDERNESS IN MOUTH AND THROAT WITH OR WITHOUT PRESENCE OF ULCERS (sore throat, sores in mouth, or a toothache) °UNUSUAL RASH, SWELLING OR PAIN  °UNUSUAL VAGINAL DISCHARGE OR ITCHING  ° °Items with * indicate a potential emergency and should be followed up as soon as possible or go to the Emergency Department if any problems should occur. ° °Please show the CHEMOTHERAPY ALERT CARD or IMMUNOTHERAPY ALERT CARD at check-in to the  Emergency Department and triage nurse. ° °Should you have questions after your visit or need to cancel or reschedule your appointment, please contact [facility]  Dept: [phone]  and follow the prompts.  Office hours are 8:00 a.m. to 4:30 p.m. Monday - Friday. Please note that voicemails left after 4:00 p.m. may not be returned until the following business day.  We are closed weekends and major holidays. You have access to a nurse at all times for urgent questions. Please call the main number to the clinic Dept: [phone] and follow the prompts. ° ° °For any non-urgent questions, you may also contact your provider using MyChart. We now offer e-Visits for anyone 18 and older to request care online for non-urgent symptoms. For details visit [email or web address]. °  °Also download the MyChart app! Go to the app store, search "MyChart", open the app, select [facility], and log in with your MyChart username and password. ° °Due to Covid, a mask is required upon entering the hospital/clinic. If you do not have a mask, one will be given to you upon arrival. For doctor visits, patients may have 1 support person aged 18 or older with them. For treatment visits, patients cannot have anyone with them due to current Covid guidelines and our immunocompromised population.  ° °

## 2020-06-24 NOTE — Assessment & Plan Note (Signed)
08/01/2019:Palpable right breast mass for 1 month. Mammogram revealed a 5 cm mass 8:30 position, 3 cm intramammary lymph node at 10 o'clock position and 2 enlarged right axillary lymph nodes. Biopsy revealed grade 1 IDC ER 90%, PR 10%, Ki-67 10%, HER-2 negative. Intramammary node and axillary lymph node both positive with similar prognostic profile  Breast MRI 08/15/2019: Biopsy-proven malignancy lower outer quadrant right breast, non-mass enhancement spanning 8.7 cm with probable invasion of underlying pectoral muscle. Biopsy-proven metastatic lymph node in the right axillary tail. Mild asymmetric right infraclavicular adenopathy.Biopsy positive for invasive ductal carcinoma grade 1  CT CAP 08/24/2019: Right axillary and right retropectoral adenopathy compatible with nodal metastases. Indistinct sclerotic 1.2 cm left acetabular lesion. No other distant metastases MammaPrint: Luminal type A, low risk, no benefit to chemotherapy Bone scan: Negative for metastatic disease  TreatmentSummary: 1.Neoadjuvant antiestrogen therapy with anastrozole 1 mg daily started 08/08/2019 2.bilateral mastectomies1/13/2022: Right mastectomy with ALND: IDC 6.5 cm, 7/8 lymph nodes positive;left mastectomy: Benign ER 90%, PR 10%, Ki-67 10%, HER2 negative 3.given the fact that the patient had 7 positive lymph nodes, the standard of care would be to offer adjuvant chemotherapy. I recommended dose dense Adriamycin and Cytoxan followed by Taxol. 4.Adjradiation 5.Followed by adjuvant antiestrogen therapy with the CDK 4 and 6 inhibitor ----------------------------------------------------------------------------------------------------------------------------- Current Treatment:Completed 4 cycles ofDose dense Adriamycin and Cytoxan, Today is cycle6Taxol   Chemo Toxicities: 1.Fatigue: Progressively worsening with each cycle of chemo 2.Chemo induced anemia:Today's hemoglobin is 9.4 3.We will  decrease the dosage of Decadron that she gets with treatment of 4 mg daily. Monitoring closely for neuropathy.  Return to clinic weekly for Taxol and every other week for follow-up with me.  

## 2020-06-24 NOTE — Progress Notes (Signed)
Patient Care Team: Deland Pretty, MD as PCP - General (Internal Medicine) Mauro Kaufmann, RN as Medical Oncologist Rockwell Germany, RN as Oncology Nurse Navigator  DIAGNOSIS:    ICD-10-CM   1. Malignant neoplasm of lower-outer quadrant of right breast of female, estrogen receptor positive ([place])  C50.511    Z17.0     SUMMARY OF ONCOLOGIC HISTORY: Oncology History  Malignant neoplasm of lower-outer quadrant of right breast of female, estrogen receptor positive ([place])  08/01/2019 Initial Diagnosis   Palpable right breast mass for 1 month.  Mammogram revealed a 5 cm mass 8:30 position, 3 cm intramammary lymph node at 10 o'clock position and 2 enlarged right axillary lymph nodes.  Biopsy revealed grade 1 IDC ER 90%, PR 10%, Ki-67 10%, HER-2 negative.  Intramammary node and axillary lymph node both positive with similar prognostic profile   08/08/2019 -  Neo-Adjuvant Anti-estrogen oral therapy   Anastrozole while deciding treatment plan    02/28/2020 Surgery   Bilateral mastectomies [name]):  Right breast: IDC, 6.5cm, metastatic carcinoma in 6/7 right axillary lymph nodes, 1.5cm, 1.5cm, and 0.3cm, and one intramammary lymph node, 2.5cm Left breast: no evidence of malignancy in the breast or 1 left axillary lymph node.   03/26/2020 -  Chemotherapy    Patient is on Treatment Plan: BREAST ADJUVANT DOSE DENSE AC Q14D / PACLITAXEL Q7D        CHIEF COMPLIANT: Cycle 6Taxol  INTERVAL HISTORY: Katelyn DONALSON is a 65 y.o. with above-mentioned history of right breast cancertreated withneoadjuvant antiestrogen therapy,bilateral mastectomies, and who is currently on adjuvant chemotherapy withTaxol after completing four cycles ofdose dense Adriamcyin and Cytoxan. She presents today for a toxicity checkand treatment.  ALLERGIES:  has No Known Allergies.  MEDICATIONS:  Current Outpatient Medications  Medication Sig Dispense Refill  . calcium carbonate (OS-CAL) 1250 (500 Ca) MG chewable  tablet Chew 1 tablet by mouth daily.    . cholecalciferol (VITAMIN D3) 25 MCG (1000 UNIT) tablet Take 1,000 Units by mouth daily.    [name] doxycycline (MONODOX) 100 MG capsule Take 100 mg by mouth 2 (two) times daily.    [name] levothyroxine (SYNTHROID) 125 MCG tablet Take 1 tablet (125 mcg total) by mouth daily before breakfast.    . lidocaine-prilocaine (EMLA) cream Apply to affected area once 30 g 3  . LORazepam (ATIVAN) 0.5 MG tablet Take 1 tablet (0.5 mg total) by mouth every 8 (eight) hours. 10 tablet 0  . Multiple Vitamins-Minerals (MULTIVITAMIN WITH MINERALS) tablet Take 1 tablet by mouth daily.    . ondansetron (ZOFRAN) 8 MG tablet Take 1 tablet (8 mg total) by mouth 2 (two) times daily as needed. Start on the third day after chemotherapy. 30 tablet 1  . prochlorperazine (COMPAZINE) 10 MG tablet Take 1 tablet (10 mg total) by mouth every 6 (six) hours as needed (Nausea or vomiting). 30 tablet 1  . rosuvastatin (CRESTOR) 10 MG tablet Take 1 tablet (10 mg total) by mouth daily.     No current facility-administered medications for this visit.    PHYSICAL EXAMINATION: ECOG PERFORMANCE STATUS: 1 - Symptomatic but completely ambulatory  There were no vitals filed for this visit. There were no vitals filed for this visit.  LABORATORY DATA:  I have reviewed the data as listed CMP Latest Ref Rng & Units 06/18/2020 06/11/2020 06/04/2020  Glucose 70 - 99 mg/dL 103(H) 117(H) 137(H)  BUN 8 - 23 mg/dL '10 12 12  ' Creatinine 0.44 - 1.00 mg/dL 0.64 0.73 0.70  Sodium 135 - 145 mmol/L 141 139 139  Potassium 3.5 - 5.1 mmol/L 3.8 4.2 4.0  Chloride 98 - 111 mmol/L 106 104 105  CO2 22 - 32 mmol/L '27 26 24  ' Calcium 8.9 - 10.3 mg/dL 8.9 9.0 8.8(L)  Total Protein 6.5 - 8.1 g/dL 6.4(L) 6.5 6.2(L)  Total Bilirubin 0.3 - 1.2 mg/dL 0.4 0.5 0.3  Alkaline Phos 38 - 126 U/L 59 61 54  AST 15 - 41 U/L '15 17 16  ' ALT 0 - 44 U/L '15 21 21    ' Lab Results  Component Value Date   WBC 4.5 06/18/2020   HGB 9.3 (L)  06/18/2020   HCT 27.1 (L) 06/18/2020   MCV 93.1 06/18/2020   PLT 225 06/18/2020   NEUTROABS 2.8 06/18/2020    ASSESSMENT & PLAN:  Malignant neoplasm of lower-outer quadrant of right breast of female, estrogen receptor positive ([place]) 08/01/2019:Palpable right breast mass for 1 month. Mammogram revealed a 5 cm mass 8:30 position, 3 cm intramammary lymph node at 10 o'clock position and 2 enlarged right axillary lymph nodes. Biopsy revealed grade 1 IDC ER 90%, PR 10%, Ki-67 10%, HER-2 negative. Intramammary node and axillary lymph node both positive with similar prognostic profile  Breast MRI 08/15/2019: Biopsy-proven malignancy lower outer quadrant right breast, non-mass enhancement spanning 8.7 cm with probable invasion of underlying pectoral muscle. Biopsy-proven metastatic lymph node in the right axillary tail. Mild asymmetric right infraclavicular adenopathy.Biopsy positive for invasive ductal carcinoma grade 1  CT CAP 08/24/2019: Right axillary and right retropectoral adenopathy compatible with nodal metastases. Indistinct sclerotic 1.2 cm left acetabular lesion. No other distant metastases MammaPrint: Luminal type A, low risk, no benefit to chemotherapy Bone scan: Negative for metastatic disease  TreatmentSummary: 1.Neoadjuvant antiestrogen therapy with anastrozole 1 mg daily started 08/08/2019 2.bilateral mastectomies1/13/2022: Right mastectomy with ALND: IDC 6.5 cm, 7/8 lymph nodes positive;left mastectomy: Benign ER 90%, PR 10%, Ki-67 10%, HER2 negative 3.given the fact that the patient had 7 positive lymph nodes, the standard of care would be to offer adjuvant chemotherapy. I recommended dose dense Adriamycin and Cytoxan followed by Taxol. 4.Adjradiation 5.Followed by adjuvant antiestrogen therapy with the CDK 4 and 6 inhibitor ----------------------------------------------------------------------------------------------------------------------------- Current  Treatment:Completed 4 cycles ofDose dense Adriamycin and Cytoxan, Today is cycle6Taxol   Chemo Toxicities: 1.Fatigue: Progressively worsening with each cycle of chemo 2.Chemo induced anemia:Today's hemoglobin is 9.3 3.We will decrease the dosage of Decadron that she gets with treatment of 4 mg daily. 4.  1 episode of chest tightness 2 weeks ago: It has not happened again since the decrease the speed of infusion. Monitoring closely for neuropathy.  Return to clinic weekly for Taxol and every other week for follow-up with me.     No orders of the defined types were placed in this encounter.  The patient has a good understanding of the overall plan. she agrees with it. she will call with any problems that may develop before the next visit here.  Total time spent: 30 mins including face to face time and time spent for planning, charting and coordination of care  Rulon Eisenmenger, MD, MPH 06/25/2020  I, Molly Dorshimer, am acting as scribe for Dr. [name].  I have reviewed the above documentation for accuracy and completeness, and I agree with the above.

## 2020-06-25 ENCOUNTER — Inpatient Hospital Stay: Payer: Commercial Managed Care - PPO

## 2020-06-25 ENCOUNTER — Inpatient Hospital Stay (HOSPITAL_BASED_OUTPATIENT_CLINIC_OR_DEPARTMENT_OTHER): Payer: Commercial Managed Care - PPO | Admitting: Hematology and Oncology

## 2020-06-25 ENCOUNTER — Encounter: Payer: Self-pay | Admitting: *Deleted

## 2020-06-25 ENCOUNTER — Other Ambulatory Visit: Payer: Self-pay

## 2020-06-25 DIAGNOSIS — Z95828 Presence of other vascular implants and grafts: Secondary | ICD-10-CM

## 2020-06-25 DIAGNOSIS — C50511 Malignant neoplasm of lower-outer quadrant of right female breast: Secondary | ICD-10-CM | POA: Diagnosis not present

## 2020-06-25 DIAGNOSIS — Z170 Estrogen receptor positive status [ER+]: Secondary | ICD-10-CM

## 2020-06-25 DIAGNOSIS — Z5111 Encounter for antineoplastic chemotherapy: Secondary | ICD-10-CM | POA: Diagnosis not present

## 2020-06-25 LAB — CMP (CANCER CENTER ONLY)
ALT: 21 U/L (ref 0–44)
AST: 19 U/L (ref 15–41)
Albumin: 4 g/dL (ref 3.5–5.0)
Alkaline Phosphatase: 57 U/L (ref 38–126)
Anion gap: 8 (ref 5–15)
BUN: 14 mg/dL (ref 8–23)
CO2: 25 mmol/L (ref 22–32)
Calcium: 9.1 mg/dL (ref 8.9–10.3)
Chloride: 106 mmol/L (ref 98–111)
Creatinine: 0.76 mg/dL (ref 0.44–1.00)
GFR, Estimated: >60 mL/min (ref >60)
Glucose, Bld: 107 mg/dL — ABNORMAL HIGH (ref 70–99)
Potassium: 3.9 mmol/L (ref 3.5–5.1)
Sodium: 139 mmol/L (ref 135–145)
Total Bilirubin: 0.3 mg/dL (ref 0.3–1.2)
Total Protein: 6.4 g/dL — ABNORMAL LOW (ref 6.5–8.1)

## 2020-06-25 LAB — CBC WITH DIFFERENTIAL (CANCER CENTER ONLY)
Abs Immature Granulocytes: 0.03 10*3/uL (ref 0.00–0.07)
Basophils Absolute: 0 10*3/uL (ref 0.0–0.1)
Basophils Relative: 1 %
Eosinophils Absolute: 0.1 10*3/uL (ref 0.0–0.5)
Eosinophils Relative: 2 %
HCT: 26.6 % — ABNORMAL LOW (ref 36.0–46.0)
Hemoglobin: 9.3 g/dL — ABNORMAL LOW (ref 12.0–15.0)
Immature Granulocytes: 1 %
Lymphocytes Relative: 18 %
Lymphs Abs: 0.9 10*3/uL (ref 0.7–4.0)
MCH: 32.2 pg (ref 26.0–34.0)
MCHC: 35 g/dL (ref 30.0–36.0)
MCV: 92 fL (ref 80.0–100.0)
Monocytes Absolute: 0.5 10*3/uL (ref 0.1–1.0)
Monocytes Relative: 10 %
Neutro Abs: 3.2 10*3/uL (ref 1.7–7.7)
Neutrophils Relative %: 68 %
Platelet Count: 232 10*3/uL (ref 150–400)
RBC: 2.89 MIL/uL — ABNORMAL LOW (ref 3.87–5.11)
RDW: 15 % (ref 11.5–15.5)
WBC Count: 4.7 10*3/uL (ref 4.0–10.5)
nRBC: 0 % (ref 0.0–0.2)

## 2020-06-25 MED ORDER — FAMOTIDINE 20 MG IN NS 100 ML IVPB
INTRAVENOUS | Status: AC
  Filled 2020-06-25: qty 100

## 2020-06-25 MED ORDER — SODIUM CHLORIDE 0.9 % IV SOLN
80.0000 mg/m2 | Freq: Once | INTRAVENOUS | Status: AC
  Administered 2020-06-25: 144 mg via INTRAVENOUS
  Filled 2020-06-25: qty 24

## 2020-06-25 MED ORDER — SODIUM CHLORIDE 0.9 % IV SOLN
Freq: Once | INTRAVENOUS | Status: AC
  Filled 2020-06-25: qty 250

## 2020-06-25 MED ORDER — DEXAMETHASONE SODIUM PHOSPHATE 10 MG/ML IJ SOLN
INTRAMUSCULAR | Status: AC
  Filled 2020-06-25: qty 1

## 2020-06-25 MED ORDER — SODIUM CHLORIDE 0.9% FLUSH
10.0000 mL | Freq: Once | INTRAVENOUS | Status: AC
  Administered 2020-06-25: 10 mL
  Filled 2020-06-25: qty 10

## 2020-06-25 MED ORDER — DIPHENHYDRAMINE HCL 50 MG/ML IJ SOLN
INTRAMUSCULAR | Status: AC
  Filled 2020-06-25: qty 1

## 2020-06-25 MED ORDER — DIPHENHYDRAMINE HCL 50 MG/ML IJ SOLN
50.0000 mg | Freq: Once | INTRAMUSCULAR | Status: AC
  Administered 2020-06-25: 50 mg via INTRAVENOUS

## 2020-06-25 MED ORDER — DEXAMETHASONE SODIUM PHOSPHATE 10 MG/ML IJ SOLN
4.0000 mg | Freq: Once | INTRAMUSCULAR | Status: AC
  Administered 2020-06-25: 4 mg via INTRAVENOUS

## 2020-06-25 MED ORDER — FAMOTIDINE 20 MG IN NS 100 ML IVPB
20.0000 mg | Freq: Once | INTRAVENOUS | Status: AC
  Administered 2020-06-25: 20 mg via INTRAVENOUS

## 2020-06-25 NOTE — Patient Instructions (Signed)
[facility]COLOG[facility]  Discharge Instructions: °Thank you for choosing [facility] to provide your oncology and hematology care.  ° °If you have a lab appointment with the Cancer Center, please go directly to the Cancer Center and check in at the registration area. °  °Wear comfortable clothing and clothing appropriate for easy access to any Portacath or PICC line.  ° °We strive to give you quality time with your provider. You may need to reschedule your appointment if you arrive late (15 or more minutes).  Arriving late affects you and other patients whose appointments are after yours.  Also, if you miss three or more appointments without notifying the office, you may be dismissed from the clinic at the provider’s discretion.    °  °For prescription refill requests, have your pharmacy contact our office and allow 72 hours for refills to be completed.   ° °Today you received the following chemotherapy and/or immunotherapy agents: Taxol   °  °To help prevent nausea and vomiting after your treatment, we encourage you to take your nausea medication as directed. ° °BELOW ARE SYMPTOMS THAT SHOULD BE REPORTED IMMEDIATELY: °*FEVER GREATER THAN 100.4 F (38 °C) OR HIGHER °*CHILLS OR SWEATING °*NAUSEA AND VOMITING THAT IS NOT CONTROLLED WITH YOUR NAUSEA MEDICATION °*UNUSUAL SHORTNESS OF BREATH °*UNUSUAL BRUISING OR BLEEDING °*URINARY PROBLEMS (pain or burning when urinating, or frequent urination) °*BOWEL PROBLEMS (unusual diarrhea, constipation, pain near the anus) °TENDERNESS IN MOUTH AND THROAT WITH OR WITHOUT PRESENCE OF ULCERS (sore throat, sores in mouth, or a toothache) °UNUSUAL RASH, SWELLING OR PAIN  °UNUSUAL VAGINAL DISCHARGE OR ITCHING  ° °Items with * indicate a potential emergency and should be followed up as soon as possible or go to the Emergency Department if any problems should occur. ° °Please show the CHEMOTHERAPY ALERT CARD or IMMUNOTHERAPY ALERT CARD at check-in to the  Emergency Department and triage nurse. ° °Should you have questions after your visit or need to cancel or reschedule your appointment, please contact [facility]  Dept: [phone]  and follow the prompts.  Office hours are 8:00 a.m. to 4:30 p.m. Monday - Friday. Please note that voicemails left after 4:00 p.m. may not be returned until the following business day.  We are closed weekends and major holidays. You have access to a nurse at all times for urgent questions. Please call the main number to the clinic Dept: [phone] and follow the prompts. ° ° °For any non-urgent questions, you may also contact your provider using MyChart. We now offer e-Visits for anyone 18 and older to request care online for non-urgent symptoms. For details visit [email or web address]. °  °Also download the MyChart app! Go to the app store, search "MyChart", open the app, select [facility], and log in with your MyChart username and password. ° °Due to Covid, a mask is required upon entering the hospital/clinic. If you do not have a mask, one will be given to you upon arrival. For doctor visits, patients may have 1 support person aged 18 or older with them. For treatment visits, patients cannot have anyone with them due to current Covid guidelines and our immunocompromised population.  ° °

## 2020-07-02 ENCOUNTER — Inpatient Hospital Stay: Payer: Commercial Managed Care - PPO

## 2020-07-02 ENCOUNTER — Other Ambulatory Visit: Payer: Self-pay

## 2020-07-02 VITALS — BP 110/70 | HR 94 | Temp 98.6°F | Resp 17

## 2020-07-02 DIAGNOSIS — Z95828 Presence of other vascular implants and grafts: Secondary | ICD-10-CM

## 2020-07-02 DIAGNOSIS — C50511 Malignant neoplasm of lower-outer quadrant of right female breast: Secondary | ICD-10-CM

## 2020-07-02 DIAGNOSIS — Z170 Estrogen receptor positive status [ER+]: Secondary | ICD-10-CM

## 2020-07-02 DIAGNOSIS — Z5111 Encounter for antineoplastic chemotherapy: Secondary | ICD-10-CM | POA: Diagnosis not present

## 2020-07-02 LAB — CMP (CANCER CENTER ONLY)
ALT: 21 U/L (ref 0–44)
AST: 18 U/L (ref 15–41)
Albumin: 3.8 g/dL (ref 3.5–5.0)
Alkaline Phosphatase: 56 U/L (ref 38–126)
Anion gap: 9 (ref 5–15)
BUN: 11 mg/dL (ref 8–23)
CO2: 27 mmol/L (ref 22–32)
Calcium: 9.1 mg/dL (ref 8.9–10.3)
Chloride: 103 mmol/L (ref 98–111)
Creatinine: 0.67 mg/dL (ref 0.44–1.00)
GFR, Estimated: >60 mL/min (ref >60)
Glucose, Bld: 106 mg/dL — ABNORMAL HIGH (ref 70–99)
Potassium: 3.8 mmol/L (ref 3.5–5.1)
Sodium: 139 mmol/L (ref 135–145)
Total Bilirubin: 0.4 mg/dL (ref 0.3–1.2)
Total Protein: 6.4 g/dL — ABNORMAL LOW (ref 6.5–8.1)

## 2020-07-02 LAB — CBC WITH DIFFERENTIAL (CANCER CENTER ONLY)
Abs Immature Granulocytes: 0.03 10*3/uL (ref 0.00–0.07)
Basophils Absolute: 0 10*3/uL (ref 0.0–0.1)
Basophils Relative: 1 %
Eosinophils Absolute: 0.1 10*3/uL (ref 0.0–0.5)
Eosinophils Relative: 2 %
HCT: 26.7 % — ABNORMAL LOW (ref 36.0–46.0)
Hemoglobin: 9.4 g/dL — ABNORMAL LOW (ref 12.0–15.0)
Immature Granulocytes: 1 %
Lymphocytes Relative: 13 %
Lymphs Abs: 0.5 10*3/uL — ABNORMAL LOW (ref 0.7–4.0)
MCH: 32.1 pg (ref 26.0–34.0)
MCHC: 35.2 g/dL (ref 30.0–36.0)
MCV: 91.1 fL (ref 80.0–100.0)
Monocytes Absolute: 0.5 10*3/uL (ref 0.1–1.0)
Monocytes Relative: 15 %
Neutro Abs: 2.5 10*3/uL (ref 1.7–7.7)
Neutrophils Relative %: 68 %
Platelet Count: 213 10*3/uL (ref 150–400)
RBC: 2.93 MIL/uL — ABNORMAL LOW (ref 3.87–5.11)
RDW: 14.5 % (ref 11.5–15.5)
WBC Count: 3.6 10*3/uL — ABNORMAL LOW (ref 4.0–10.5)
nRBC: 0 % (ref 0.0–0.2)

## 2020-07-02 MED ORDER — HEPARIN SOD (PORK) LOCK FLUSH 100 UNIT/ML IV SOLN
500.0000 [IU] | Freq: Once | INTRAVENOUS | Status: AC | PRN
  Administered 2020-07-02: 500 [IU]
  Filled 2020-07-02: qty 5

## 2020-07-02 MED ORDER — SODIUM CHLORIDE 0.9 % IV SOLN
80.0000 mg/m2 | Freq: Once | INTRAVENOUS | Status: AC
  Administered 2020-07-02: 144 mg via INTRAVENOUS
  Filled 2020-07-02: qty 24

## 2020-07-02 MED ORDER — SODIUM CHLORIDE 0.9% FLUSH
10.0000 mL | Freq: Once | INTRAVENOUS | Status: AC
Start: 2020-07-02 — End: 2020-07-02
  Administered 2020-07-02: 10 mL
  Filled 2020-07-02: qty 10

## 2020-07-02 MED ORDER — SODIUM CHLORIDE 0.9% FLUSH
10.0000 mL | INTRAVENOUS | Status: DC | PRN
  Filled 2020-07-02: qty 10

## 2020-07-02 MED ORDER — DEXAMETHASONE SODIUM PHOSPHATE 10 MG/ML IJ SOLN
4.0000 mg | Freq: Once | INTRAMUSCULAR | Status: AC
  Administered 2020-07-02: 4 mg via INTRAVENOUS

## 2020-07-02 MED ORDER — SODIUM CHLORIDE 0.9 % IV SOLN
Freq: Once | INTRAVENOUS | Status: AC
  Filled 2020-07-02: qty 250

## 2020-07-02 MED ORDER — FAMOTIDINE 20 MG IN NS 100 ML IVPB
20.0000 mg | Freq: Once | INTRAVENOUS | Status: AC
  Administered 2020-07-02: 20 mg via INTRAVENOUS

## 2020-07-02 MED ORDER — FAMOTIDINE 20 MG IN NS 100 ML IVPB
INTRAVENOUS | Status: AC
  Filled 2020-07-02: qty 100

## 2020-07-02 MED ORDER — DIPHENHYDRAMINE HCL 50 MG/ML IJ SOLN
50.0000 mg | Freq: Once | INTRAMUSCULAR | Status: AC
  Administered 2020-07-02: 50 mg via INTRAVENOUS

## 2020-07-02 MED ORDER — DEXAMETHASONE SODIUM PHOSPHATE 10 MG/ML IJ SOLN
INTRAMUSCULAR | Status: AC
  Filled 2020-07-02: qty 1

## 2020-07-02 MED ORDER — DIPHENHYDRAMINE HCL 50 MG/ML IJ SOLN
INTRAMUSCULAR | Status: AC
  Filled 2020-07-02: qty 1

## 2020-07-02 NOTE — Patient Instructions (Signed)
Implanted Port Insertion, Care After This sheet gives you information about how to care for yourself after your procedure. Your health care provider may also give you more specific instructions. If you have problems or questions, contact your health care provider. What can I expect after the procedure? After the procedure, it is common to have:  Discomfort at the port insertion site.  Bruising on the skin over the port. This should improve over 3-4 days. Follow these instructions at home: Port care  After your port is placed, you will get a manufacturer's information card. The card has information about your port. Keep this card with you at all times.  Take care of the port as told by your health care provider. Ask your health care provider if you or a family member can get training for taking care of the port at home. A home health care nurse may also take care of the port.  Make sure to remember what type of port you have. Incision care  Follow instructions from your health care provider about how to take care of your port insertion site. Make sure you: ? Wash your hands with soap and water before and after you change your bandage (dressing). If soap and water are not available, use hand sanitizer. ? Change your dressing as told by your health care provider. ? Leave stitches (sutures), skin glue, or adhesive strips in place. These skin closures may need to stay in place for 2 weeks or longer. If adhesive strip edges start to loosen and curl up, you may trim the loose edges. Do not remove adhesive strips completely unless your health care provider tells you to do that.  Check your port insertion site every day for signs of infection. Check for: ? Redness, swelling, or pain. ? Fluid or blood. ? Warmth. ? Pus or a bad smell.      Activity  Return to your normal activities as told by your health care provider. Ask your health care provider what activities are safe for you.  Do not  lift anything that is heavier than 10 lb (4.5 kg), or the limit that you are told, until your health care provider says that it is safe. General instructions  Take over-the-counter and prescription medicines only as told by your health care provider.  Do not take baths, swim, or use a hot tub until your health care provider approves. Ask your health care provider if you may take showers. You may only be allowed to take sponge baths.  Do not drive for 24 hours if you were given a sedative during your procedure.  Wear a medical alert bracelet in case of an emergency. This will tell any health care providers that you have a port.  Keep all follow-up visits as told by your health care provider. This is important. Contact a health care provider if:  You cannot flush your port with saline as directed, or you cannot draw blood from the port.  You have a fever or chills.  You have redness, swelling, or pain around your port insertion site.  You have fluid or blood coming from your port insertion site.  Your port insertion site feels warm to the touch.  You have pus or a bad smell coming from the port insertion site. Get help right away if:  You have chest pain or shortness of breath.  You have bleeding from your port that you cannot control. Summary  Take care of the port as told by your   health care provider. Keep the manufacturer's information card with you at all times.  Change your dressing as told by your health care provider.  Contact a health care provider if you have a fever or chills or if you have redness, swelling, or pain around your port insertion site.  Keep all follow-up visits as told by your health care provider. This information is not intended to replace advice given to you by your health care provider. Make sure you discuss any questions you have with your health care provider. Document Revised: 08/30/2017 Document Reviewed: 08/30/2017 Elsevier Patient Education   2021 [employer]  

## 2020-07-02 NOTE — Patient Instructions (Signed)
[facility]  Discharge Instructions: Thank you for choosing [facility] to provide your oncology and hematology care.   If you have a lab appointment with the [facility], please go directly to the [facility] and check in at the registration area.   Wear comfortable clothing and clothing appropriate for easy access to any Portacath or PICC line.   We strive to give you quality time with your provider. You may need to reschedule your appointment if you arrive late (15 or more minutes).  Arriving late affects you and other patients whose appointments are after yours.  Also, if you miss three or more appointments without notifying the office, you may be dismissed from the clinic at the provider's discretion.      For prescription refill requests, have your pharmacy contact our office and allow 72 hours for refills to be completed.    Today you received the following chemotherapy and/or immunotherapy agents Paclitaxel       To help prevent nausea and vomiting after your treatment, we encourage you to take your nausea medication as directed.  BELOW ARE SYMPTOMS THAT SHOULD BE REPORTED IMMEDIATELY: . *FEVER GREATER THAN 100.4 F (38 C) OR HIGHER . *CHILLS OR SWEATING . *NAUSEA AND VOMITING THAT IS NOT CONTROLLED WITH YOUR NAUSEA MEDICATION . *UNUSUAL SHORTNESS OF BREATH . *UNUSUAL BRUISING OR BLEEDING . *URINARY PROBLEMS (pain or burning when urinating, or frequent urination) . *BOWEL PROBLEMS (unusual diarrhea, constipation, pain near the anus) . TENDERNESS IN MOUTH AND THROAT WITH OR WITHOUT PRESENCE OF ULCERS (sore throat, sores in mouth, or a toothache) . UNUSUAL RASH, SWELLING OR PAIN  . UNUSUAL VAGINAL DISCHARGE OR ITCHING   Items with * indicate a potential emergency and should be followed up as soon as possible or go to the Emergency Department if any problems should occur.  Please show the CHEMOTHERAPY ALERT CARD or IMMUNOTHERAPY  ALERT CARD at check-in to the Emergency Department and triage nurse.  Should you have questions after your visit or need to cancel or reschedule your appointment, please contact [facility]  Dept: [phone]  and follow the prompts.  Office hours are 8:00 a.m. to 4:30 p.m. Monday - Friday. Please note that voicemails left after 4:00 p.m. may not be returned until the following business day.  We are closed weekends and major holidays. You have access to a nurse at all times for urgent questions. Please call the main number to the clinic Dept: [phone] and follow the prompts.   For any non-urgent questions, you may also contact your provider using MyChart. We now offer e-Visits for anyone 30 and older to request care online for non-urgent symptoms. For details visit [email or web address].   Also download the MyChart app! Go to the app store, search "MyChart", open the app, select [facility], and log in with your MyChart username and password.  Due to Covid, a mask is required upon entering the hospital/clinic. If you do not have a mask, one will be given to you upon arrival. For doctor visits, patients may have 1 support person aged 40 or older with them. For treatment visits, patients cannot have anyone with them due to current Covid guidelines and our immunocompromised population.  Paclitaxel injection What is this medicine? PACLITAXEL (PAK li TAX el) is a chemotherapy drug. It targets fast dividing cells, like cancer cells, and causes these cells to die. This medicine is used to treat ovarian cancer, breast cancer,  lung cancer, Kaposi's sarcoma, and other cancers. This medicine may be used for other purposes; ask your health care provider or pharmacist if you have questions. COMMON BRAND NAME(S): Onxol, Taxol What should I tell my health care provider before I take this medicine? They need to know if you have any of these conditions:  history of irregular  heartbeat  liver disease  low blood counts, like low white cell, platelet, or red cell counts  lung or breathing disease, like asthma  tingling of the fingers or toes, or other nerve disorder  an unusual or allergic reaction to paclitaxel, alcohol, polyoxyethylated castor oil, other chemotherapy, other medicines, foods, dyes, or preservatives  pregnant or trying to get pregnant  breast-feeding How should I use this medicine? This drug is given as an infusion into a vein. It is administered in a hospital or clinic by a specially trained health care professional. Talk to your pediatrician regarding the use of this medicine in children. Special care may be needed. Overdosage: If you think you have taken too much of this medicine contact a poison control center or emergency room at once. NOTE: This medicine is only for you. Do not share this medicine with others. What if I miss a dose? It is important not to miss your dose. Call your doctor or health care professional if you are unable to keep an appointment. What may interact with this medicine? Do not take this medicine with any of the following medications:  live virus vaccines This medicine may also interact with the following medications:  antiviral medicines for hepatitis, HIV or AIDS  certain antibiotics like erythromycin and clarithromycin  certain medicines for fungal infections like ketoconazole and itraconazole  certain medicines for seizures like carbamazepine, phenobarbital, phenytoin  gemfibrozil  nefazodone  rifampin  St. John's wort This list may not describe all possible interactions. Give your health care provider a list of all the medicines, herbs, non-prescription drugs, or dietary supplements you use. Also tell them if you smoke, drink alcohol, or use illegal drugs. Some items may interact with your medicine. What should I watch for while using this medicine? Your condition will be monitored carefully  while you are receiving this medicine. You will need important blood work done while you are taking this medicine. This medicine can cause serious allergic reactions. To reduce your risk you will need to take other medicine(s) before treatment with this medicine. If you experience allergic reactions like skin rash, itching or hives, swelling of the face, lips, or tongue, tell your doctor or health care professional right away. In some cases, you may be given additional medicines to help with side effects. Follow all directions for their use. This drug may make you feel generally unwell. This is not uncommon, as chemotherapy can affect healthy cells as well as cancer cells. Report any side effects. Continue your course of treatment even though you feel ill unless your doctor tells you to stop. Call your doctor or health care professional for advice if you get a fever, chills or sore throat, or other symptoms of a cold or flu. Do not treat yourself. This drug decreases your body's ability to fight infections. Try to avoid being around people who are sick. This medicine may increase your risk to bruise or bleed. Call your doctor or health care professional if you notice any unusual bleeding. Be careful brushing and flossing your teeth or using a toothpick because you may get an infection or bleed more easily. If you  have any dental work done, tell your dentist you are receiving this medicine. Avoid taking products that contain aspirin, acetaminophen, ibuprofen, naproxen, or ketoprofen unless instructed by your doctor. These medicines may hide a fever. Do not become pregnant while taking this medicine. Women should inform their doctor if they wish to become pregnant or think they might be pregnant. There is a potential for serious side effects to an unborn child. Talk to your health care professional or pharmacist for more information. Do not breast-feed an infant while taking this medicine. Men are advised not  to father a child while receiving this medicine. This product may contain alcohol. Ask your pharmacist or healthcare provider if this medicine contains alcohol. Be sure to tell all healthcare providers you are taking this medicine. Certain medicines, like metronidazole and disulfiram, can cause an unpleasant reaction when taken with alcohol. The reaction includes flushing, headache, nausea, vomiting, sweating, and increased thirst. The reaction can last from 30 minutes to several hours. What side effects may I notice from receiving this medicine? Side effects that you should report to your doctor or health care professional as soon as possible:  allergic reactions like skin rash, itching or hives, swelling of the face, lips, or tongue  breathing problems  changes in vision  fast, irregular heartbeat  high or low blood pressure  mouth sores  pain, tingling, numbness in the hands or feet  signs of decreased platelets or bleeding - bruising, pinpoint red spots on the skin, black, tarry stools, blood in the urine  signs of decreased red blood cells - unusually weak or tired, feeling faint or lightheaded, falls  signs of infection - fever or chills, cough, sore throat, pain or difficulty passing urine  signs and symptoms of liver injury like dark yellow or brown urine; general ill feeling or flu-like symptoms; light-colored stools; loss of appetite; nausea; right upper belly pain; unusually weak or tired; yellowing of the eyes or skin  swelling of the ankles, feet, hands  unusually slow heartbeat Side effects that usually do not require medical attention (report to your doctor or health care professional if they continue or are bothersome):  diarrhea  hair loss  loss of appetite  muscle or joint pain  nausea, vomiting  pain, redness, or irritation at site where injected  tiredness This list may not describe all possible side effects. Call your doctor for medical advice about  side effects. You may report side effects to FDA at 1-800-FDA-1088. Where should I keep my medicine? This drug is given in a hospital or clinic and will not be stored at home. NOTE: This sheet is a summary. It may not cover all possible information. If you have questions about this medicine, talk to your doctor, pharmacist, or health care provider.  2021 Elsevier/Gold Standard (2019-01-03 13:37:23)

## 2020-07-08 NOTE — Progress Notes (Signed)
Patient Care Team: Deland Pretty, MD as PCP - General (Internal Medicine) Mauro Kaufmann, RN as Medical Oncologist Rockwell Germany, RN as Oncology Nurse Navigator  DIAGNOSIS:    ICD-10-CM   1. Malignant neoplasm of lower-outer quadrant of right breast of female, estrogen receptor positive ([place])  C50.511    Z17.0     SUMMARY OF ONCOLOGIC HISTORY: Oncology History  Malignant neoplasm of lower-outer quadrant of right breast of female, estrogen receptor positive ([place])  08/01/2019 Initial Diagnosis   Palpable right breast mass for 1 month.  Mammogram revealed a 5 cm mass 8:30 position, 3 cm intramammary lymph node at 10 o'clock position and 2 enlarged right axillary lymph nodes.  Biopsy revealed grade 1 IDC ER 90%, PR 10%, Ki-67 10%, HER-2 negative.  Intramammary node and axillary lymph node both positive with similar prognostic profile   08/08/2019 -  Neo-Adjuvant Anti-estrogen oral therapy   Anastrozole while deciding treatment plan    02/28/2020 Surgery   Bilateral mastectomies [name]):  Right breast: IDC, 6.5cm, metastatic carcinoma in 6/7 right axillary lymph nodes, 1.5cm, 1.5cm, and 0.3cm, and one intramammary lymph node, 2.5cm Left breast: no evidence of malignancy in the breast or 1 left axillary lymph node.   03/26/2020 -  Chemotherapy    Patient is on Treatment Plan: BREAST ADJUVANT DOSE DENSE AC Q14D / PACLITAXEL Q7D        CHIEF COMPLIANT: Cycle8Taxol  INTERVAL HISTORY: [name] is a 65 y.o. with above-mentioned history of right breast cancertreated withneoadjuvant antiestrogen therapy,bilateral mastectomies, and who is currently on adjuvant chemotherapy withTaxol after completing four cycles ofdose dense Adriamcyin and Cytoxan. She presents today for a toxicity checkand treatment.  ALLERGIES:  has No Known Allergies.  MEDICATIONS:  Current Outpatient Medications  Medication Sig Dispense Refill  . calcium carbonate (OS-CAL) 1250 (500 Ca) MG chewable  tablet Chew 1 tablet by mouth daily.    . cholecalciferol (VITAMIN D3) 25 MCG (1000 UNIT) tablet Take 1,000 Units by mouth daily.    Marland Kitchen doxycycline (MONODOX) 100 MG capsule Take 100 mg by mouth 2 (two) times daily.    Marland Kitchen levothyroxine (SYNTHROID) 125 MCG tablet Take 1 tablet (125 mcg total) by mouth daily before breakfast.    . lidocaine-prilocaine (EMLA) cream Apply to affected area once 30 g 3  . LORazepam (ATIVAN) 0.5 MG tablet Take 1 tablet (0.5 mg total) by mouth every 8 (eight) hours. 10 tablet 0  . Multiple Vitamins-Minerals (MULTIVITAMIN WITH MINERALS) tablet Take 1 tablet by mouth daily.    . ondansetron (ZOFRAN) 8 MG tablet Take 1 tablet (8 mg total) by mouth 2 (two) times daily as needed. Start on the third day after chemotherapy. 30 tablet 1  . prochlorperazine (COMPAZINE) 10 MG tablet Take 1 tablet (10 mg total) by mouth every 6 (six) hours as needed (Nausea or vomiting). 30 tablet 1  . rosuvastatin (CRESTOR) 10 MG tablet Take 1 tablet (10 mg total) by mouth daily.     No current facility-administered medications for this visit.   Facility-Administered Medications Ordered in Other Visits  Medication Dose Route Frequency Provider Last Rate Last Admin  . sodium chloride flush (NS) 0.9 % injection 10 mL  10 mL Intracatheter PRN [name], MD   10 mL at 07/09/20 1434    PHYSICAL EXAMINATION: ECOG PERFORMANCE STATUS: 1 - Symptomatic but completely ambulatory  Vitals:   07/09/20 1117  BP: 122/71  Pulse: 86  Resp: 18  Temp: (!) 97.5 F (36.4 C)  SpO2: 100%   Filed Weights   07/09/20 1117  Weight: 146 lb 9.6 oz (66.5 kg)    LABORATORY DATA:  I have reviewed the data as listed CMP Latest Ref Rng & Units 07/09/2020 07/02/2020 06/25/2020  Glucose 70 - 99 mg/dL 102(H) 106(H) 107(H)  BUN 8 - 23 mg/dL _0 Creatinine 0.44 - 1.00 mg/dL 0.64 0.67 0.76  Sodium 135 - 145 mmol/L 139 139 139  Potassium 3.5 - 5.1 mmol/L 4.0 3.8 3.9  Chloride 98 - 111 mmol/L 105 103 106  CO2 22  - 32 mmol/L _1 Calcium 8.9 - 10.3 mg/dL 8.8(L) 9.1 9.1  Total Protein 6.5 - 8.1 g/dL 6.3(L) 6.4(L) 6.4(L)  Total Bilirubin 0.3 - 1.2 mg/dL 0.3 0.4 0.3  Alkaline Phos 38 - 126 U/L 57 56 57  AST 15 - 41 U/L _2 ALT 0 - 44 U/L _3 Lab Results  Component Value Date   WBC 4.0 07/09/2020   HGB 9.6 (L) 07/09/2020   HCT 27.2 (L) 07/09/2020   MCV 90.7 07/09/2020   PLT 226 07/09/2020   NEUTROABS 2.6 07/09/2020    ASSESSMENT & PLAN:  Malignant neoplasm of lower-outer quadrant of right breast of female, estrogen receptor positive ([place]) 08/01/2019:Palpable right breast mass for 1 month. Mammogram revealed a 5 cm mass 8:30 position, 3 cm intramammary lymph node at 10 o'clock position and 2 enlarged right axillary lymph nodes. Biopsy revealed grade 1 IDC ER 90%, PR 10%, Ki-67 10%, HER-2 negative. Intramammary node and axillary lymph node both positive with similar prognostic profile  Breast MRI 08/15/2019: Biopsy-proven malignancy lower outer quadrant right breast, non-mass enhancement spanning 8.7 cm with probable invasion of underlying pectoral muscle. Biopsy-proven metastatic lymph node in the right axillary tail. Mild asymmetric right infraclavicular adenopathy.Biopsy positive for invasive ductal carcinoma grade 1  CT CAP 08/24/2019: Right axillary and right retropectoral adenopathy compatible with nodal metastases. Indistinct sclerotic 1.2 cm left acetabular lesion. No other distant metastases MammaPrint: Luminal type A, low risk, no benefit to chemotherapy Bone scan: Negative for metastatic disease  TreatmentSummary: 1.Neoadjuvant antiestrogen therapy with anastrozole 1 mg daily started 08/08/2019 2.bilateral mastectomies1/13/2022: Right mastectomy with ALND: IDC 6.5 cm, 7/8 lymph nodes positive;left mastectomy: Benign ER 90%, PR 10%, Ki-67 10%, HER2 negative 3.given the fact that the patient had 7 positive lymph nodes, the standard of care would be to  offer adjuvant chemotherapy. I recommended dose dense Adriamycin and Cytoxan followed by Taxol. 4.Adjradiation 5.Followed by adjuvant antiestrogen therapy with the CDK 4 and 6 inhibitor ----------------------------------------------------------------------------------------------------------------------------- Current Treatment:Completed 4 cycles ofDose dense Adriamycin and Cytoxan, Today is cycle8Taxol   Chemo Toxicities: 1.Fatigue: Progressively worsening with each cycle of chemo 2.Chemo induced anemia:Today's hemoglobin is9.6 3.We decreased the dosage of Decadron that she gets with treatment of66m daily.  Monitoring closely for neuropathy.  Denies any current neuropathy symptoms.  Return to clinic weekly for Taxol and every other week for follow-up with me.    No orders of the defined types were placed in this encounter.  The patient has a good understanding of the overall plan. she agrees with it. she will call with any problems that may develop before the next visit here.  Total time spent: 30 mins including face to face time and time spent for planning, charting and coordination of care  VRulon Eisenmenger MD, MPH 07/09/2020  I, Molly Dorshimer, am acting as scribe for Dr. VNicholas Lose  I have reviewed  the above documentation for accuracy and completeness, and I agree with the above.

## 2020-07-09 ENCOUNTER — Inpatient Hospital Stay (HOSPITAL_BASED_OUTPATIENT_CLINIC_OR_DEPARTMENT_OTHER): Payer: Commercial Managed Care - PPO | Admitting: Hematology and Oncology

## 2020-07-09 ENCOUNTER — Inpatient Hospital Stay: Payer: Commercial Managed Care - PPO

## 2020-07-09 ENCOUNTER — Other Ambulatory Visit: Payer: Self-pay

## 2020-07-09 ENCOUNTER — Encounter: Payer: Self-pay | Admitting: *Deleted

## 2020-07-09 DIAGNOSIS — C50511 Malignant neoplasm of lower-outer quadrant of right female breast: Secondary | ICD-10-CM

## 2020-07-09 DIAGNOSIS — Z95828 Presence of other vascular implants and grafts: Secondary | ICD-10-CM

## 2020-07-09 DIAGNOSIS — Z5111 Encounter for antineoplastic chemotherapy: Secondary | ICD-10-CM | POA: Diagnosis not present

## 2020-07-09 DIAGNOSIS — Z170 Estrogen receptor positive status [ER+]: Secondary | ICD-10-CM | POA: Diagnosis not present

## 2020-07-09 LAB — CBC WITH DIFFERENTIAL (CANCER CENTER ONLY)
Abs Immature Granulocytes: 0.03 10*3/uL (ref 0.00–0.07)
Basophils Absolute: 0 10*3/uL (ref 0.0–0.1)
Basophils Relative: 1 %
Eosinophils Absolute: 0.1 10*3/uL (ref 0.0–0.5)
Eosinophils Relative: 3 %
HCT: 27.2 % — ABNORMAL LOW (ref 36.0–46.0)
Hemoglobin: 9.6 g/dL — ABNORMAL LOW (ref 12.0–15.0)
Immature Granulocytes: 1 %
Lymphocytes Relative: 19 %
Lymphs Abs: 0.8 10*3/uL (ref 0.7–4.0)
MCH: 32 pg (ref 26.0–34.0)
MCHC: 35.3 g/dL (ref 30.0–36.0)
MCV: 90.7 fL (ref 80.0–100.0)
Monocytes Absolute: 0.4 10*3/uL (ref 0.1–1.0)
Monocytes Relative: 11 %
Neutro Abs: 2.6 10*3/uL (ref 1.7–7.7)
Neutrophils Relative %: 65 %
Platelet Count: 226 10*3/uL (ref 150–400)
RBC: 3 MIL/uL — ABNORMAL LOW (ref 3.87–5.11)
RDW: 13.8 % (ref 11.5–15.5)
WBC Count: 4 10*3/uL (ref 4.0–10.5)
nRBC: 0 % (ref 0.0–0.2)

## 2020-07-09 LAB — CMP (CANCER CENTER ONLY)
ALT: 18 U/L (ref 0–44)
AST: 17 U/L (ref 15–41)
Albumin: 3.7 g/dL (ref 3.5–5.0)
Alkaline Phosphatase: 57 U/L (ref 38–126)
Anion gap: 9 (ref 5–15)
BUN: 8 mg/dL (ref 8–23)
CO2: 25 mmol/L (ref 22–32)
Calcium: 8.8 mg/dL — ABNORMAL LOW (ref 8.9–10.3)
Chloride: 105 mmol/L (ref 98–111)
Creatinine: 0.64 mg/dL (ref 0.44–1.00)
GFR, Estimated: >60 mL/min (ref >60)
Glucose, Bld: 102 mg/dL — ABNORMAL HIGH (ref 70–99)
Potassium: 4 mmol/L (ref 3.5–5.1)
Sodium: 139 mmol/L (ref 135–145)
Total Bilirubin: 0.3 mg/dL (ref 0.3–1.2)
Total Protein: 6.3 g/dL — ABNORMAL LOW (ref 6.5–8.1)

## 2020-07-09 MED ORDER — SODIUM CHLORIDE 0.9% FLUSH
10.0000 mL | Freq: Once | INTRAVENOUS | Status: AC
  Administered 2020-07-09: 10 mL
  Filled 2020-07-09: qty 10

## 2020-07-09 MED ORDER — DIPHENHYDRAMINE HCL 50 MG/ML IJ SOLN
INTRAMUSCULAR | Status: AC
  Filled 2020-07-09: qty 1

## 2020-07-09 MED ORDER — SODIUM CHLORIDE 0.9% FLUSH
10.0000 mL | INTRAVENOUS | Status: DC | PRN
  Administered 2020-07-09: 10 mL
  Filled 2020-07-09: qty 10

## 2020-07-09 MED ORDER — DEXAMETHASONE SODIUM PHOSPHATE 10 MG/ML IJ SOLN
4.0000 mg | Freq: Once | INTRAMUSCULAR | Status: AC
  Administered 2020-07-09: 4 mg via INTRAVENOUS

## 2020-07-09 MED ORDER — SODIUM CHLORIDE 0.9 % IV SOLN
80.0000 mg/m2 | Freq: Once | INTRAVENOUS | Status: AC
  Administered 2020-07-09: 144 mg via INTRAVENOUS
  Filled 2020-07-09: qty 24

## 2020-07-09 MED ORDER — FAMOTIDINE 20 MG IN NS 100 ML IVPB
INTRAVENOUS | Status: AC
  Filled 2020-07-09: qty 100

## 2020-07-09 MED ORDER — HEPARIN SOD (PORK) LOCK FLUSH 100 UNIT/ML IV SOLN
500.0000 [IU] | Freq: Once | INTRAVENOUS | Status: AC | PRN
  Administered 2020-07-09: 500 [IU]
  Filled 2020-07-09: qty 5

## 2020-07-09 MED ORDER — DEXAMETHASONE SODIUM PHOSPHATE 10 MG/ML IJ SOLN
INTRAMUSCULAR | Status: AC
  Filled 2020-07-09: qty 1

## 2020-07-09 MED ORDER — DIPHENHYDRAMINE HCL 50 MG/ML IJ SOLN
50.0000 mg | Freq: Once | INTRAMUSCULAR | Status: AC
  Administered 2020-07-09: 50 mg via INTRAVENOUS

## 2020-07-09 MED ORDER — FAMOTIDINE 20 MG IN NS 100 ML IVPB
20.0000 mg | Freq: Once | INTRAVENOUS | Status: AC
  Administered 2020-07-09: 20 mg via INTRAVENOUS

## 2020-07-09 MED ORDER — SODIUM CHLORIDE 0.9 % IV SOLN
Freq: Once | INTRAVENOUS | Status: AC
  Filled 2020-07-09: qty 250

## 2020-07-09 NOTE — Assessment & Plan Note (Signed)
08/01/2019:Palpable right breast mass for 1 month. Mammogram revealed a 5 cm mass 8:30 position, 3 cm intramammary lymph node at 10 o'clock position and 2 enlarged right axillary lymph nodes. Biopsy revealed grade 1 IDC ER 90%, PR 10%, Ki-67 10%, HER-2 negative. Intramammary node and axillary lymph node both positive with similar prognostic profile  Breast MRI 08/15/2019: Biopsy-proven malignancy lower outer quadrant right breast, non-mass enhancement spanning 8.7 cm with probable invasion of underlying pectoral muscle. Biopsy-proven metastatic lymph node in the right axillary tail. Mild asymmetric right infraclavicular adenopathy.Biopsy positive for invasive ductal carcinoma grade 1  CT CAP 08/24/2019: Right axillary and right retropectoral adenopathy compatible with nodal metastases. Indistinct sclerotic 1.2 cm left acetabular lesion. No other distant metastases MammaPrint: Luminal type A, low risk, no benefit to chemotherapy Bone scan: Negative for metastatic disease  TreatmentSummary: 1.Neoadjuvant antiestrogen therapy with anastrozole 1 mg daily started 08/08/2019 2.bilateral mastectomies1/13/2022: Right mastectomy with ALND: IDC 6.5 cm, 7/8 lymph nodes positive;left mastectomy: Benign ER 90%, PR 10%, Ki-67 10%, HER2 negative 3.given the fact that the patient had 7 positive lymph nodes, the standard of care would be to offer adjuvant chemotherapy. I recommended dose dense Adriamycin and Cytoxan followed by Taxol. 4.Adjradiation 5.Followed by adjuvant antiestrogen therapy with the CDK 4 and 6 inhibitor ----------------------------------------------------------------------------------------------------------------------------- Current Treatment:Completed 4 cycles ofDose dense Adriamycin and Cytoxan, Today is cycle8Taxol   Chemo Toxicities: 1.Fatigue: Progressively worsening with each cycle of chemo 2.Chemo induced anemia:Today's hemoglobin is  3.We decreased  the dosage of Decadron that she gets with treatment of85m daily.  Monitoring closely for neuropathy.  Return to clinic weekly for Taxol and every other week for follow-up with me.

## 2020-07-16 ENCOUNTER — Inpatient Hospital Stay: Payer: Commercial Managed Care - PPO | Attending: Hematology and Oncology

## 2020-07-16 ENCOUNTER — Other Ambulatory Visit: Payer: Self-pay

## 2020-07-16 ENCOUNTER — Inpatient Hospital Stay: Payer: Commercial Managed Care - PPO

## 2020-07-16 VITALS — BP 121/59 | HR 82 | Temp 98.5°F | Resp 18 | Wt 144.0 lb

## 2020-07-16 DIAGNOSIS — Z170 Estrogen receptor positive status [ER+]: Secondary | ICD-10-CM | POA: Diagnosis not present

## 2020-07-16 DIAGNOSIS — Z95828 Presence of other vascular implants and grafts: Secondary | ICD-10-CM

## 2020-07-16 DIAGNOSIS — Z5111 Encounter for antineoplastic chemotherapy: Secondary | ICD-10-CM | POA: Insufficient documentation

## 2020-07-16 DIAGNOSIS — C773 Secondary and unspecified malignant neoplasm of axilla and upper limb lymph nodes: Secondary | ICD-10-CM | POA: Diagnosis not present

## 2020-07-16 DIAGNOSIS — C50511 Malignant neoplasm of lower-outer quadrant of right female breast: Secondary | ICD-10-CM | POA: Insufficient documentation

## 2020-07-16 LAB — CBC WITH DIFFERENTIAL (CANCER CENTER ONLY)
Abs Immature Granulocytes: 0.08 10*3/uL — ABNORMAL HIGH (ref 0.00–0.07)
Basophils Absolute: 0.1 10*3/uL (ref 0.0–0.1)
Basophils Relative: 1 %
Eosinophils Absolute: 0.2 10*3/uL (ref 0.0–0.5)
Eosinophils Relative: 4 %
HCT: 29.7 % — ABNORMAL LOW (ref 36.0–46.0)
Hemoglobin: 9.9 g/dL — ABNORMAL LOW (ref 12.0–15.0)
Immature Granulocytes: 2 %
Lymphocytes Relative: 19 %
Lymphs Abs: 0.9 10*3/uL (ref 0.7–4.0)
MCH: 30.8 pg (ref 26.0–34.0)
MCHC: 33.3 g/dL (ref 30.0–36.0)
MCV: 92.5 fL (ref 80.0–100.0)
Monocytes Absolute: 0.5 10*3/uL (ref 0.1–1.0)
Monocytes Relative: 11 %
Neutro Abs: 2.9 10*3/uL (ref 1.7–7.7)
Neutrophils Relative %: 63 %
Platelet Count: 267 10*3/uL (ref 150–400)
RBC: 3.21 MIL/uL — ABNORMAL LOW (ref 3.87–5.11)
RDW: 13.8 % (ref 11.5–15.5)
WBC Count: 4.6 10*3/uL (ref 4.0–10.5)
nRBC: 0 % (ref 0.0–0.2)

## 2020-07-16 LAB — CMP (CANCER CENTER ONLY)
ALT: 21 U/L (ref 0–44)
AST: 18 U/L (ref 15–41)
Albumin: 3.8 g/dL (ref 3.5–5.0)
Alkaline Phosphatase: 50 U/L (ref 38–126)
Anion gap: 11 (ref 5–15)
BUN: 10 mg/dL (ref 8–23)
CO2: 23 mmol/L (ref 22–32)
Calcium: 9.2 mg/dL (ref 8.9–10.3)
Chloride: 107 mmol/L (ref 98–111)
Creatinine: 0.65 mg/dL (ref 0.44–1.00)
GFR, Estimated: >60 mL/min (ref >60)
Glucose, Bld: 106 mg/dL — ABNORMAL HIGH (ref 70–99)
Potassium: 3.9 mmol/L (ref 3.5–5.1)
Sodium: 141 mmol/L (ref 135–145)
Total Bilirubin: 0.5 mg/dL (ref 0.3–1.2)
Total Protein: 6.4 g/dL — ABNORMAL LOW (ref 6.5–8.1)

## 2020-07-16 MED ORDER — SODIUM CHLORIDE 0.9% FLUSH
10.0000 mL | INTRAVENOUS | Status: DC | PRN
  Administered 2020-07-16: 10 mL
  Filled 2020-07-16: qty 10

## 2020-07-16 MED ORDER — DIPHENHYDRAMINE HCL 50 MG/ML IJ SOLN
50.0000 mg | Freq: Once | INTRAMUSCULAR | Status: AC
  Administered 2020-07-16: 50 mg via INTRAVENOUS

## 2020-07-16 MED ORDER — DEXAMETHASONE SODIUM PHOSPHATE 10 MG/ML IJ SOLN
INTRAMUSCULAR | Status: AC
  Filled 2020-07-16: qty 1

## 2020-07-16 MED ORDER — SODIUM CHLORIDE 0.9 % IV SOLN
Freq: Once | INTRAVENOUS | Status: AC
  Filled 2020-07-16: qty 250

## 2020-07-16 MED ORDER — DEXAMETHASONE SODIUM PHOSPHATE 10 MG/ML IJ SOLN
4.0000 mg | Freq: Once | INTRAMUSCULAR | Status: AC
  Administered 2020-07-16: 4 mg via INTRAVENOUS

## 2020-07-16 MED ORDER — DIPHENHYDRAMINE HCL 50 MG/ML IJ SOLN
INTRAMUSCULAR | Status: AC
  Filled 2020-07-16: qty 1

## 2020-07-16 MED ORDER — HEPARIN SOD (PORK) LOCK FLUSH 100 UNIT/ML IV SOLN
500.0000 [IU] | Freq: Once | INTRAVENOUS | Status: AC | PRN
  Administered 2020-07-16: 500 [IU]
  Filled 2020-07-16: qty 5

## 2020-07-16 MED ORDER — SODIUM CHLORIDE 0.9% FLUSH
10.0000 mL | Freq: Once | INTRAVENOUS | Status: AC
  Administered 2020-07-16: 10 mL
  Filled 2020-07-16: qty 10

## 2020-07-16 MED ORDER — FAMOTIDINE 20 MG IN NS 100 ML IVPB
INTRAVENOUS | Status: AC
  Filled 2020-07-16: qty 100

## 2020-07-16 MED ORDER — SODIUM CHLORIDE 0.9 % IV SOLN
80.0000 mg/m2 | Freq: Once | INTRAVENOUS | Status: AC
  Administered 2020-07-16: 144 mg via INTRAVENOUS
  Filled 2020-07-16: qty 24

## 2020-07-16 MED ORDER — FAMOTIDINE 20 MG IN NS 100 ML IVPB
20.0000 mg | Freq: Once | INTRAVENOUS | Status: AC
  Administered 2020-07-16: 20 mg via INTRAVENOUS

## 2020-07-16 NOTE — Progress Notes (Signed)
Taxol ordered to be infused over 90 minutes per patient preference, however, patient requests it run over 60 minutes.  She states it has been infused this way in the past & she had no ill effects.  Taxol set to infuse over 60 minutes on pump.

## 2020-07-16 NOTE — Patient Instructions (Signed)
[facility]  Discharge Instructions: Thank you for choosing [facility] to provide your oncology and hematology care.   If you have a lab appointment with the [facility], please go directly to the [facility] and check in at the registration area.   Wear comfortable clothing and clothing appropriate for easy access to any Portacath or PICC line.   We strive to give you quality time with your provider. You may need to reschedule your appointment if you arrive late (15 or more minutes).  Arriving late affects you and other patients whose appointments are after yours.  Also, if you miss three or more appointments without notifying the office, you may be dismissed from the clinic at the provider's discretion.      For prescription refill requests, have your pharmacy contact our office and allow 72 hours for refills to be completed.    Today you received the following chemotherapy and/or immunotherapy agents Taxol     To help prevent nausea and vomiting after your treatment, we encourage you to take your nausea medication as directed.  BELOW ARE SYMPTOMS THAT SHOULD BE REPORTED IMMEDIATELY: . *FEVER GREATER THAN 100.4 F (38 C) OR HIGHER . *CHILLS OR SWEATING . *NAUSEA AND VOMITING THAT IS NOT CONTROLLED WITH YOUR NAUSEA MEDICATION . *UNUSUAL SHORTNESS OF BREATH . *UNUSUAL BRUISING OR BLEEDING . *URINARY PROBLEMS (pain or burning when urinating, or frequent urination) . *BOWEL PROBLEMS (unusual diarrhea, constipation, pain near the anus) . TENDERNESS IN MOUTH AND THROAT WITH OR WITHOUT PRESENCE OF ULCERS (sore throat, sores in mouth, or a toothache) . UNUSUAL RASH, SWELLING OR PAIN  . UNUSUAL VAGINAL DISCHARGE OR ITCHING   Items with * indicate a potential emergency and should be followed up as soon as possible or go to the Emergency Department if any problems should occur.  Please show the CHEMOTHERAPY ALERT CARD or IMMUNOTHERAPY ALERT CARD  at check-in to the Emergency Department and triage nurse.  Should you have questions after your visit or need to cancel or reschedule your appointment, please contact [facility]  Dept: [phone]  and follow the prompts.  Office hours are 8:00 a.m. to 4:30 p.m. Monday - Friday. Please note that voicemails left after 4:00 p.m. may not be returned until the following business day.  We are closed weekends and major holidays. You have access to a nurse at all times for urgent questions. Please call the main number to the clinic Dept: [phone] and follow the prompts.   For any non-urgent questions, you may also contact your provider using MyChart. We now offer e-Visits for anyone 15 and older to request care online for non-urgent symptoms. For details visit [email or web address].   Also download the MyChart app! Go to the app store, search "MyChart", open the app, select [facility], and log in with your MyChart username and password.  Due to Covid, a mask is required upon entering the hospital/clinic. If you do not have a mask, one will be given to you upon arrival. For doctor visits, patients may have 1 support person aged 78 or older with them. For treatment visits, patients cannot have anyone with them due to current Covid guidelines and our immunocompromised population.   Paclitaxel injection What is this medicine? PACLITAXEL (PAK li TAX el) is a chemotherapy drug. It targets fast dividing cells, like cancer cells, and causes these cells to die. This medicine is used to treat ovarian cancer, breast cancer, lung  cancer, Kaposi's sarcoma, and other cancers. This medicine may be used for other purposes; ask your health care provider or pharmacist if you have questions. COMMON BRAND NAME(S): Onxol, Taxol What should I tell my health care provider before I take this medicine? They need to know if you have any of these conditions:  history of irregular  heartbeat  liver disease  low blood counts, like low white cell, platelet, or red cell counts  lung or breathing disease, like asthma  tingling of the fingers or toes, or other nerve disorder  an unusual or allergic reaction to paclitaxel, alcohol, polyoxyethylated castor oil, other chemotherapy, other medicines, foods, dyes, or preservatives  pregnant or trying to get pregnant  breast-feeding How should I use this medicine? This drug is given as an infusion into a vein. It is administered in a hospital or clinic by a specially trained health care professional. Talk to your pediatrician regarding the use of this medicine in children. Special care may be needed. Overdosage: If you think you have taken too much of this medicine contact a poison control center or emergency room at once. NOTE: This medicine is only for you. Do not share this medicine with others. What if I miss a dose? It is important not to miss your dose. Call your doctor or health care professional if you are unable to keep an appointment. What may interact with this medicine? Do not take this medicine with any of the following medications:  live virus vaccines This medicine may also interact with the following medications:  antiviral medicines for hepatitis, HIV or AIDS  certain antibiotics like erythromycin and clarithromycin  certain medicines for fungal infections like ketoconazole and itraconazole  certain medicines for seizures like carbamazepine, phenobarbital, phenytoin  gemfibrozil  nefazodone  rifampin  St. John's wort This list may not describe all possible interactions. Give your health care provider a list of all the medicines, herbs, non-prescription drugs, or dietary supplements you use. Also tell them if you smoke, drink alcohol, or use illegal drugs. Some items may interact with your medicine. What should I watch for while using this medicine? Your condition will be monitored carefully  while you are receiving this medicine. You will need important blood work done while you are taking this medicine. This medicine can cause serious allergic reactions. To reduce your risk you will need to take other medicine(s) before treatment with this medicine. If you experience allergic reactions like skin rash, itching or hives, swelling of the face, lips, or tongue, tell your doctor or health care professional right away. In some cases, you may be given additional medicines to help with side effects. Follow all directions for their use. This drug may make you feel generally unwell. This is not uncommon, as chemotherapy can affect healthy cells as well as cancer cells. Report any side effects. Continue your course of treatment even though you feel ill unless your doctor tells you to stop. Call your doctor or health care professional for advice if you get a fever, chills or sore throat, or other symptoms of a cold or flu. Do not treat yourself. This drug decreases your body's ability to fight infections. Try to avoid being around people who are sick. This medicine may increase your risk to bruise or bleed. Call your doctor or health care professional if you notice any unusual bleeding. Be careful brushing and flossing your teeth or using a toothpick because you may get an infection or bleed more easily. If you have  any dental work done, tell your dentist you are receiving this medicine. Avoid taking products that contain aspirin, acetaminophen, ibuprofen, naproxen, or ketoprofen unless instructed by your doctor. These medicines may hide a fever. Do not become pregnant while taking this medicine. Women should inform their doctor if they wish to become pregnant or think they might be pregnant. There is a potential for serious side effects to an unborn child. Talk to your health care professional or pharmacist for more information. Do not breast-feed an infant while taking this medicine. Men are advised not  to father a child while receiving this medicine. This product may contain alcohol. Ask your pharmacist or healthcare provider if this medicine contains alcohol. Be sure to tell all healthcare providers you are taking this medicine. Certain medicines, like metronidazole and disulfiram, can cause an unpleasant reaction when taken with alcohol. The reaction includes flushing, headache, nausea, vomiting, sweating, and increased thirst. The reaction can last from 30 minutes to several hours. What side effects may I notice from receiving this medicine? Side effects that you should report to your doctor or health care professional as soon as possible:  allergic reactions like skin rash, itching or hives, swelling of the face, lips, or tongue  breathing problems  changes in vision  fast, irregular heartbeat  high or low blood pressure  mouth sores  pain, tingling, numbness in the hands or feet  signs of decreased platelets or bleeding - bruising, pinpoint red spots on the skin, black, tarry stools, blood in the urine  signs of decreased red blood cells - unusually weak or tired, feeling faint or lightheaded, falls  signs of infection - fever or chills, cough, sore throat, pain or difficulty passing urine  signs and symptoms of liver injury like dark yellow or brown urine; general ill feeling or flu-like symptoms; light-colored stools; loss of appetite; nausea; right upper belly pain; unusually weak or tired; yellowing of the eyes or skin  swelling of the ankles, feet, hands  unusually slow heartbeat Side effects that usually do not require medical attention (report to your doctor or health care professional if they continue or are bothersome):  diarrhea  hair loss  loss of appetite  muscle or joint pain  nausea, vomiting  pain, redness, or irritation at site where injected  tiredness This list may not describe all possible side effects. Call your doctor for medical advice about  side effects. You may report side effects to FDA at 1-800-FDA-1088. Where should I keep my medicine? This drug is given in a hospital or clinic and will not be stored at home. NOTE: This sheet is a summary. It may not cover all possible information. If you have questions about this medicine, talk to your doctor, pharmacist, or health care provider.  2021 Elsevier/Gold Standard (2019-01-03 13:37:23)

## 2020-07-22 NOTE — Progress Notes (Signed)
Patient Care Team: Deland Pretty, MD as PCP - General (Internal Medicine) Mauro Kaufmann, RN as Medical Oncologist Rockwell Germany, RN as Oncology Nurse Navigator  DIAGNOSIS:    ICD-10-CM   1. Malignant neoplasm of lower-outer quadrant of right breast of female, estrogen receptor positive ([name])  C50.511    Z17.0     SUMMARY OF ONCOLOGIC HISTORY: Oncology History  Malignant neoplasm of lower-outer quadrant of right breast of female, estrogen receptor positive ([name])  08/01/2019 Initial Diagnosis   Palpable right breast mass for 1 month.  Mammogram revealed a 5 cm mass 8:30 position, 3 cm intramammary lymph node at 10 o'clock position and 2 enlarged right axillary lymph nodes.  Biopsy revealed grade 1 IDC ER 90%, PR 10%, Ki-67 10%, HER-2 negative.  Intramammary node and axillary lymph node both positive with similar prognostic profile   08/08/2019 -  Neo-Adjuvant Anti-estrogen oral therapy   Anastrozole while deciding treatment plan    02/28/2020 Surgery   Bilateral mastectomies [name]):  Right breast: IDC, 6.5cm, metastatic carcinoma in 6/7 right axillary lymph nodes, 1.5cm, 1.5cm, and 0.3cm, and one intramammary lymph node, 2.5cm Left breast: no evidence of malignancy in the breast or 1 left axillary lymph node.   03/26/2020 -  Chemotherapy    Patient is on Treatment Plan: BREAST ADJUVANT DOSE DENSE AC Q14D / PACLITAXEL Q7D        CHIEF COMPLIANT: Cycle10Taxol  INTERVAL HISTORY: [name] is a 65 y.o. with above-mentioned history of right breast cancertreated withneoadjuvant antiestrogen therapy,bilateral mastectomies, and who is currently on adjuvant chemotherapy withTaxol after completing four cycles ofdose dense Adriamcyin and Cytoxan. She presents today for a toxicity checkand treatment. She denies any peripheral neuropathy.  Energy levels are slowly improving.  ALLERGIES:  has No Known Allergies.  MEDICATIONS:  Current Outpatient Medications  Medication Sig  Dispense Refill  . calcium carbonate (OS-CAL) 1250 (500 Ca) MG chewable tablet Chew 1 tablet by mouth daily.    . cholecalciferol (VITAMIN D3) 25 MCG (1000 UNIT) tablet Take 1,000 Units by mouth daily.    Marland Kitchen doxycycline (MONODOX) 100 MG capsule Take 100 mg by mouth 2 (two) times daily.    Marland Kitchen levothyroxine (SYNTHROID) 125 MCG tablet Take 1 tablet (125 mcg total) by mouth daily before breakfast.    . lidocaine-prilocaine (EMLA) cream Apply to affected area once 30 g 3  . LORazepam (ATIVAN) 0.5 MG tablet Take 1 tablet (0.5 mg total) by mouth every 8 (eight) hours. 10 tablet 0  . Multiple Vitamins-Minerals (MULTIVITAMIN WITH MINERALS) tablet Take 1 tablet by mouth daily.    . ondansetron (ZOFRAN) 8 MG tablet Take 1 tablet (8 mg total) by mouth 2 (two) times daily as needed. Start on the third day after chemotherapy. 30 tablet 1  . prochlorperazine (COMPAZINE) 10 MG tablet Take 1 tablet (10 mg total) by mouth every 6 (six) hours as needed (Nausea or vomiting). 30 tablet 1  . rosuvastatin (CRESTOR) 10 MG tablet Take 1 tablet (10 mg total) by mouth daily.     No current facility-administered medications for this visit.    PHYSICAL EXAMINATION: ECOG PERFORMANCE STATUS: 1 - Symptomatic but completely ambulatory  There were no vitals filed for this visit. There were no vitals filed for this visit.  LABORATORY DATA:  I have reviewed the data as listed CMP Latest Ref Rng & Units 07/16/2020 07/09/2020 07/02/2020  Glucose 70 - 99 mg/dL 106(H) 102(H) 106(H)  BUN 8 - 23 mg/dL 10 8 11  Creatinine 0.44 - 1.00 mg/dL 0.65 0.64 0.67  Sodium 135 - 145 mmol/L 141 139 139  Potassium 3.5 - 5.1 mmol/L 3.9 4.0 3.8  Chloride 98 - 111 mmol/L 107 105 103  CO2 22 - 32 mmol/L '23 25 27  ' Calcium 8.9 - 10.3 mg/dL 9.2 8.8(L) 9.1  Total Protein 6.5 - 8.1 g/dL 6.4(L) 6.3(L) 6.4(L)  Total Bilirubin 0.3 - 1.2 mg/dL 0.5 0.3 0.4  Alkaline Phos 38 - 126 U/L 50 57 56  AST 15 - 41 U/L '18 17 18  ' ALT 0 - 44 U/L '21 18 21    ' Lab  Results  Component Value Date   WBC 4.8 07/23/2020   HGB 10.1 (L) 07/23/2020   HCT 28.8 (L) 07/23/2020   MCV 90.0 07/23/2020   PLT 251 07/23/2020   NEUTROABS 3.3 07/23/2020    ASSESSMENT & PLAN:  Malignant neoplasm of lower-outer quadrant of right breast of female, estrogen receptor positive ([name]) 08/01/2019:Palpable right breast mass for 1 month. Mammogram revealed a 5 cm mass 8:30 position, 3 cm intramammary lymph node at 10 o'clock position and 2 enlarged right axillary lymph nodes. Biopsy revealed grade 1 IDC ER 90%, PR 10%, Ki-67 10%, HER-2 negative. Intramammary node and axillary lymph node both positive with similar prognostic profile  Breast MRI 08/15/2019: Biopsy-proven malignancy lower outer quadrant right breast, non-mass enhancement spanning 8.7 cm with probable invasion of underlying pectoral muscle. Biopsy-proven metastatic lymph node in the right axillary tail. Mild asymmetric right infraclavicular adenopathy.Biopsy positive for invasive ductal carcinoma grade 1  CT CAP 08/24/2019: Right axillary and right retropectoral adenopathy compatible with nodal metastases. Indistinct sclerotic 1.2 cm left acetabular lesion. No other distant metastases MammaPrint: Luminal type A, low risk, no benefit to chemotherapy Bone scan: Negative for metastatic disease  TreatmentSummary: 1.Neoadjuvant antiestrogen therapy with anastrozole 1 mg daily started 08/08/2019 2.bilateral mastectomies1/13/2022: Right mastectomy with ALND: IDC 6.5 cm, 7/8 lymph nodes positive;left mastectomy: Benign ER 90%, PR 10%, Ki-67 10%, HER2 negative 3.given the fact that the patient had 7 positive lymph nodes, the standard of care would be to offer adjuvant chemotherapy. I recommended dose dense Adriamycin and Cytoxan followed by Taxol. 4.Adjradiation 5.Followed by adjuvant antiestrogen therapy with letrozole and  abemaciclib ----------------------------------------------------------------------------------------------------------------------------- Current Treatment:Completed 4 cycles ofDose dense Adriamycin and Cytoxan, Today is cycle10Taxol   Chemo Toxicities: 1.Fatigue: Progressively worsening with each cycle of chemo, lately her energy levels have started to improve. 2.Chemo induced anemia:Today's hemoglobin is10.1   Monitoring closely for neuropathy.  Denies any current neuropathy symptoms. 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  Return to clinic weekly for Taxol and in 2 weeks to see me along with her last cycle of chemo.     No orders of the defined types were placed in this encounter.  The patient has a good understanding of the overall plan. she agrees with it. she will call with any problems that may develop before the next visit here.  Total time spent: 30 mins including face to face time and time spent for planning, charting and coordination of care  Rulon Eisenmenger, MD, MPH 07/23/2020  I, Cloyde Reams Dorshimer, am acting as scribe for Dr. [name].  I have reviewed the above documentation for accuracy and completeness, and I agree with the above.

## 2020-07-22 NOTE — Assessment & Plan Note (Signed)
08/01/2019:Palpable right breast mass for 1 month. Mammogram revealed a 5 cm mass 8:30 position, 3 cm intramammary lymph node at 10 o'clock position and 2 enlarged right axillary lymph nodes. Biopsy revealed grade 1 IDC ER 90%, PR 10%, Ki-67 10%, HER-2 negative. Intramammary node and axillary lymph node both positive with similar prognostic profile  Breast MRI 08/15/2019: Biopsy-proven malignancy lower outer quadrant right breast, non-mass enhancement spanning 8.7 cm with probable invasion of underlying pectoral muscle. Biopsy-proven metastatic lymph node in the right axillary tail. Mild asymmetric right infraclavicular adenopathy.Biopsy positive for invasive ductal carcinoma grade 1  CT CAP 08/24/2019: Right axillary and right retropectoral adenopathy compatible with nodal metastases. Indistinct sclerotic 1.2 cm left acetabular lesion. No other distant metastases MammaPrint: Luminal type A, low risk, no benefit to chemotherapy Bone scan: Negative for metastatic disease  TreatmentSummary: 1.Neoadjuvant antiestrogen therapy with anastrozole 1 mg daily started 08/08/2019 2.bilateral mastectomies1/13/2022: Right mastectomy with ALND: IDC 6.5 cm, 7/8 lymph nodes positive;left mastectomy: Benign ER 90%, PR 10%, Ki-67 10%, HER2 negative 3.given the fact that the patient had 7 positive lymph nodes, the standard of care would be to offer adjuvant chemotherapy. I recommended dose dense Adriamycin and Cytoxan followed by Taxol. 4.Adjradiation 5.Followed by adjuvant antiestrogen therapy with the CDK 4 and 6 inhibitor ----------------------------------------------------------------------------------------------------------------------------- Current Treatment:Completed 4 cycles ofDose dense Adriamycin and Cytoxan, Today is cycle10Taxol   Chemo Toxicities: 1.Fatigue: Progressively worsening with each cycle of chemo 2.Chemo induced anemia:Today's hemoglobin is9.6 3.We  decreased the dosage of Decadron that she gets with treatment of61m daily.  Monitoring closely for neuropathy.  Denies any current neuropathy symptoms.  Return to clinic weekly for Taxol and every other week for follow-up with me.

## 2020-07-23 ENCOUNTER — Inpatient Hospital Stay: Payer: Commercial Managed Care - PPO

## 2020-07-23 ENCOUNTER — Inpatient Hospital Stay (HOSPITAL_BASED_OUTPATIENT_CLINIC_OR_DEPARTMENT_OTHER): Payer: Commercial Managed Care - PPO | Admitting: Hematology and Oncology

## 2020-07-23 ENCOUNTER — Other Ambulatory Visit: Payer: Self-pay

## 2020-07-23 DIAGNOSIS — Z170 Estrogen receptor positive status [ER+]: Secondary | ICD-10-CM | POA: Diagnosis not present

## 2020-07-23 DIAGNOSIS — C50511 Malignant neoplasm of lower-outer quadrant of right female breast: Secondary | ICD-10-CM

## 2020-07-23 DIAGNOSIS — Z95828 Presence of other vascular implants and grafts: Secondary | ICD-10-CM

## 2020-07-23 DIAGNOSIS — Z5111 Encounter for antineoplastic chemotherapy: Secondary | ICD-10-CM | POA: Diagnosis not present

## 2020-07-23 LAB — CMP (CANCER CENTER ONLY)
ALT: 14 U/L (ref 0–44)
AST: 17 U/L (ref 15–41)
Albumin: 3.8 g/dL (ref 3.5–5.0)
Alkaline Phosphatase: 54 U/L (ref 38–126)
Anion gap: 10 (ref 5–15)
BUN: 9 mg/dL (ref 8–23)
CO2: 25 mmol/L (ref 22–32)
Calcium: 9.1 mg/dL (ref 8.9–10.3)
Chloride: 106 mmol/L (ref 98–111)
Creatinine: 0.66 mg/dL (ref 0.44–1.00)
GFR, Estimated: >60 mL/min (ref >60)
Glucose, Bld: 114 mg/dL — ABNORMAL HIGH (ref 70–99)
Potassium: 3.9 mmol/L (ref 3.5–5.1)
Sodium: 141 mmol/L (ref 135–145)
Total Bilirubin: 0.5 mg/dL (ref 0.3–1.2)
Total Protein: 6.5 g/dL (ref 6.5–8.1)

## 2020-07-23 LAB — CBC WITH DIFFERENTIAL (CANCER CENTER ONLY)
Abs Immature Granulocytes: 0.04 10*3/uL (ref 0.00–0.07)
Basophils Absolute: 0.1 10*3/uL (ref 0.0–0.1)
Basophils Relative: 1 %
Eosinophils Absolute: 0.1 10*3/uL (ref 0.0–0.5)
Eosinophils Relative: 3 %
HCT: 28.8 % — ABNORMAL LOW (ref 36.0–46.0)
Hemoglobin: 10.1 g/dL — ABNORMAL LOW (ref 12.0–15.0)
Immature Granulocytes: 1 %
Lymphocytes Relative: 16 %
Lymphs Abs: 0.8 10*3/uL (ref 0.7–4.0)
MCH: 31.6 pg (ref 26.0–34.0)
MCHC: 35.1 g/dL (ref 30.0–36.0)
MCV: 90 fL (ref 80.0–100.0)
Monocytes Absolute: 0.5 10*3/uL (ref 0.1–1.0)
Monocytes Relative: 11 %
Neutro Abs: 3.3 10*3/uL (ref 1.7–7.7)
Neutrophils Relative %: 68 %
Platelet Count: 251 10*3/uL (ref 150–400)
RBC: 3.2 MIL/uL — ABNORMAL LOW (ref 3.87–5.11)
RDW: 13.8 % (ref 11.5–15.5)
WBC Count: 4.8 10*3/uL (ref 4.0–10.5)
nRBC: 0 % (ref 0.0–0.2)

## 2020-07-23 MED ORDER — PACLITAXEL CHEMO INJECTION 300 MG/50ML
80.0000 mg/m2 | Freq: Once | INTRAVENOUS | Status: AC
  Administered 2020-07-23: 144 mg via INTRAVENOUS
  Filled 2020-07-23: qty 24

## 2020-07-23 MED ORDER — SODIUM CHLORIDE 0.9% FLUSH
10.0000 mL | INTRAVENOUS | Status: DC | PRN
  Administered 2020-07-23: 10 mL
  Filled 2020-07-23: qty 10

## 2020-07-23 MED ORDER — SODIUM CHLORIDE 0.9% FLUSH
10.0000 mL | Freq: Once | INTRAVENOUS | Status: AC
  Administered 2020-07-23: 10 mL
  Filled 2020-07-23: qty 10

## 2020-07-23 MED ORDER — HEPARIN SOD (PORK) LOCK FLUSH 100 UNIT/ML IV SOLN
500.0000 [IU] | Freq: Once | INTRAVENOUS | Status: AC | PRN
  Administered 2020-07-23: 500 [IU]
  Filled 2020-07-23: qty 5

## 2020-07-23 MED ORDER — FAMOTIDINE 20 MG IN NS 100 ML IVPB
20.0000 mg | Freq: Once | INTRAVENOUS | Status: AC
  Administered 2020-07-23: 20 mg via INTRAVENOUS

## 2020-07-23 MED ORDER — DIPHENHYDRAMINE HCL 50 MG/ML IJ SOLN
50.0000 mg | Freq: Once | INTRAMUSCULAR | Status: AC
  Administered 2020-07-23: 50 mg via INTRAVENOUS

## 2020-07-23 MED ORDER — DEXAMETHASONE SODIUM PHOSPHATE 10 MG/ML IJ SOLN
INTRAMUSCULAR | Status: AC
  Filled 2020-07-23: qty 1

## 2020-07-23 MED ORDER — SODIUM CHLORIDE 0.9 % IV SOLN
Freq: Once | INTRAVENOUS | Status: AC
Start: 2020-07-23 — End: 2020-07-23
  Filled 2020-07-23: qty 250

## 2020-07-23 MED ORDER — FAMOTIDINE 20 MG IN NS 100 ML IVPB
INTRAVENOUS | Status: AC
  Filled 2020-07-23: qty 100

## 2020-07-23 MED ORDER — DIPHENHYDRAMINE HCL 50 MG/ML IJ SOLN
INTRAMUSCULAR | Status: AC
  Filled 2020-07-23: qty 1

## 2020-07-23 MED ORDER — DEXAMETHASONE SODIUM PHOSPHATE 10 MG/ML IJ SOLN
4.0000 mg | Freq: Once | INTRAMUSCULAR | Status: AC
  Administered 2020-07-23: 4 mg via INTRAVENOUS

## 2020-07-23 NOTE — Patient Instructions (Signed)
[facility]  Discharge Instructions: Thank you for choosing [facility] to provide your oncology and hematology care.   If you have a lab appointment with the [facility], please go directly to the [facility] and check in at the registration area.   Wear comfortable clothing and clothing appropriate for easy access to any Portacath or PICC line.   We strive to give you quality time with your provider. You may need to reschedule your appointment if you arrive late (15 or more minutes).  Arriving late affects you and other patients whose appointments are after yours.  Also, if you miss three or more appointments without notifying the office, you may be dismissed from the clinic at the provider's discretion.      For prescription refill requests, have your pharmacy contact our office and allow 72 hours for refills to be completed.    Today you received the following chemotherapy and/or immunotherapy agents Taxol     To help prevent nausea and vomiting after your treatment, we encourage you to take your nausea medication as directed.  BELOW ARE SYMPTOMS THAT SHOULD BE REPORTED IMMEDIATELY: . *FEVER GREATER THAN 100.4 F (38 C) OR HIGHER . *CHILLS OR SWEATING . *NAUSEA AND VOMITING THAT IS NOT CONTROLLED WITH YOUR NAUSEA MEDICATION . *UNUSUAL SHORTNESS OF BREATH . *UNUSUAL BRUISING OR BLEEDING . *URINARY PROBLEMS (pain or burning when urinating, or frequent urination) . *BOWEL PROBLEMS (unusual diarrhea, constipation, pain near the anus) . TENDERNESS IN MOUTH AND THROAT WITH OR WITHOUT PRESENCE OF ULCERS (sore throat, sores in mouth, or a toothache) . UNUSUAL RASH, SWELLING OR PAIN  . UNUSUAL VAGINAL DISCHARGE OR ITCHING   Items with * indicate a potential emergency and should be followed up as soon as possible or go to the Emergency Department if any problems should occur.  Please show the CHEMOTHERAPY ALERT CARD or IMMUNOTHERAPY ALERT CARD  at check-in to the Emergency Department and triage nurse.  Should you have questions after your visit or need to cancel or reschedule your appointment, please contact [facility]  Dept: [phone]  and follow the prompts.  Office hours are 8:00 a.m. to 4:30 p.m. Monday - Friday. Please note that voicemails left after 4:00 p.m. may not be returned until the following business day.  We are closed weekends and major holidays. You have access to a nurse at all times for urgent questions. Please call the main number to the clinic Dept: [phone] and follow the prompts.   For any non-urgent questions, you may also contact your provider using MyChart. We now offer e-Visits for anyone 35 and older to request care online for non-urgent symptoms. For details visit [email or web address].   Also download the MyChart app! Go to the app store, search "MyChart", open the app, select [facility], and log in with your MyChart username and password.  Due to Covid, a mask is required upon entering the hospital/clinic. If you do not have a mask, one will be given to you upon arrival. For doctor visits, patients may have 1 support person aged 50 or older with them. For treatment visits, patients cannot have anyone with them due to current Covid guidelines and our immunocompromised population.   Paclitaxel injection What is this medicine? PACLITAXEL (PAK li TAX el) is a chemotherapy drug. It targets fast dividing cells, like cancer cells, and causes these cells to die. This medicine is used to treat ovarian cancer, breast cancer, lung  cancer, Kaposi's sarcoma, and other cancers. This medicine may be used for other purposes; ask your health care provider or pharmacist if you have questions. COMMON BRAND NAME(S): Onxol, Taxol What should I tell my health care provider before I take this medicine? They need to know if you have any of these conditions:  history of irregular  heartbeat  liver disease  low blood counts, like low white cell, platelet, or red cell counts  lung or breathing disease, like asthma  tingling of the fingers or toes, or other nerve disorder  an unusual or allergic reaction to paclitaxel, alcohol, polyoxyethylated castor oil, other chemotherapy, other medicines, foods, dyes, or preservatives  pregnant or trying to get pregnant  breast-feeding How should I use this medicine? This drug is given as an infusion into a vein. It is administered in a hospital or clinic by a specially trained health care professional. Talk to your pediatrician regarding the use of this medicine in children. Special care may be needed. Overdosage: If you think you have taken too much of this medicine contact a poison control center or emergency room at once. NOTE: This medicine is only for you. Do not share this medicine with others. What if I miss a dose? It is important not to miss your dose. Call your doctor or health care professional if you are unable to keep an appointment. What may interact with this medicine? Do not take this medicine with any of the following medications:  live virus vaccines This medicine may also interact with the following medications:  antiviral medicines for hepatitis, HIV or AIDS  certain antibiotics like erythromycin and clarithromycin  certain medicines for fungal infections like ketoconazole and itraconazole  certain medicines for seizures like carbamazepine, phenobarbital, phenytoin  gemfibrozil  nefazodone  rifampin  St. John's wort This list may not describe all possible interactions. Give your health care provider a list of all the medicines, herbs, non-prescription drugs, or dietary supplements you use. Also tell them if you smoke, drink alcohol, or use illegal drugs. Some items may interact with your medicine. What should I watch for while using this medicine? Your condition will be monitored carefully  while you are receiving this medicine. You will need important blood work done while you are taking this medicine. This medicine can cause serious allergic reactions. To reduce your risk you will need to take other medicine(s) before treatment with this medicine. If you experience allergic reactions like skin rash, itching or hives, swelling of the face, lips, or tongue, tell your doctor or health care professional right away. In some cases, you may be given additional medicines to help with side effects. Follow all directions for their use. This drug may make you feel generally unwell. This is not uncommon, as chemotherapy can affect healthy cells as well as cancer cells. Report any side effects. Continue your course of treatment even though you feel ill unless your doctor tells you to stop. Call your doctor or health care professional for advice if you get a fever, chills or sore throat, or other symptoms of a cold or flu. Do not treat yourself. This drug decreases your body's ability to fight infections. Try to avoid being around people who are sick. This medicine may increase your risk to bruise or bleed. Call your doctor or health care professional if you notice any unusual bleeding. Be careful brushing and flossing your teeth or using a toothpick because you may get an infection or bleed more easily. If you have  any dental work done, tell your dentist you are receiving this medicine. Avoid taking products that contain aspirin, acetaminophen, ibuprofen, naproxen, or ketoprofen unless instructed by your doctor. These medicines may hide a fever. Do not become pregnant while taking this medicine. Women should inform their doctor if they wish to become pregnant or think they might be pregnant. There is a potential for serious side effects to an unborn child. Talk to your health care professional or pharmacist for more information. Do not breast-feed an infant while taking this medicine. Men are advised not  to father a child while receiving this medicine. This product may contain alcohol. Ask your pharmacist or healthcare provider if this medicine contains alcohol. Be sure to tell all healthcare providers you are taking this medicine. Certain medicines, like metronidazole and disulfiram, can cause an unpleasant reaction when taken with alcohol. The reaction includes flushing, headache, nausea, vomiting, sweating, and increased thirst. The reaction can last from 30 minutes to several hours. What side effects may I notice from receiving this medicine? Side effects that you should report to your doctor or health care professional as soon as possible:  allergic reactions like skin rash, itching or hives, swelling of the face, lips, or tongue  breathing problems  changes in vision  fast, irregular heartbeat  high or low blood pressure  mouth sores  pain, tingling, numbness in the hands or feet  signs of decreased platelets or bleeding - bruising, pinpoint red spots on the skin, black, tarry stools, blood in the urine  signs of decreased red blood cells - unusually weak or tired, feeling faint or lightheaded, falls  signs of infection - fever or chills, cough, sore throat, pain or difficulty passing urine  signs and symptoms of liver injury like dark yellow or brown urine; general ill feeling or flu-like symptoms; light-colored stools; loss of appetite; nausea; right upper belly pain; unusually weak or tired; yellowing of the eyes or skin  swelling of the ankles, feet, hands  unusually slow heartbeat Side effects that usually do not require medical attention (report to your doctor or health care professional if they continue or are bothersome):  diarrhea  hair loss  loss of appetite  muscle or joint pain  nausea, vomiting  pain, redness, or irritation at site where injected  tiredness This list may not describe all possible side effects. Call your doctor for medical advice about  side effects. You may report side effects to FDA at 1-800-FDA-1088. Where should I keep my medicine? This drug is given in a hospital or clinic and will not be stored at home. NOTE: This sheet is a summary. It may not cover all possible information. If you have questions about this medicine, talk to your doctor, pharmacist, or health care provider.  2021 Elsevier/Gold Standard (2019-01-03 13:37:23)

## 2020-07-30 ENCOUNTER — Inpatient Hospital Stay: Payer: Commercial Managed Care - PPO

## 2020-07-30 ENCOUNTER — Other Ambulatory Visit: Payer: Self-pay

## 2020-07-30 ENCOUNTER — Encounter: Payer: Self-pay | Admitting: *Deleted

## 2020-07-30 VITALS — BP 129/72 | HR 87 | Temp 98.1°F | Resp 17 | Wt 144.5 lb

## 2020-07-30 DIAGNOSIS — C50511 Malignant neoplasm of lower-outer quadrant of right female breast: Secondary | ICD-10-CM

## 2020-07-30 DIAGNOSIS — Z5111 Encounter for antineoplastic chemotherapy: Secondary | ICD-10-CM | POA: Diagnosis not present

## 2020-07-30 DIAGNOSIS — Z170 Estrogen receptor positive status [ER+]: Secondary | ICD-10-CM

## 2020-07-30 DIAGNOSIS — Z95828 Presence of other vascular implants and grafts: Secondary | ICD-10-CM

## 2020-07-30 LAB — CBC WITH DIFFERENTIAL (CANCER CENTER ONLY)
Abs Immature Granulocytes: 0.05 10*3/uL (ref 0.00–0.07)
Basophils Absolute: 0.1 10*3/uL (ref 0.0–0.1)
Basophils Relative: 1 %
Eosinophils Absolute: 0.1 10*3/uL (ref 0.0–0.5)
Eosinophils Relative: 2 %
HCT: 29.1 % — ABNORMAL LOW (ref 36.0–46.0)
Hemoglobin: 10.1 g/dL — ABNORMAL LOW (ref 12.0–15.0)
Immature Granulocytes: 1 %
Lymphocytes Relative: 14 %
Lymphs Abs: 0.7 10*3/uL (ref 0.7–4.0)
MCH: 31.1 pg (ref 26.0–34.0)
MCHC: 34.7 g/dL (ref 30.0–36.0)
MCV: 89.5 fL (ref 80.0–100.0)
Monocytes Absolute: 0.6 10*3/uL (ref 0.1–1.0)
Monocytes Relative: 12 %
Neutro Abs: 3.4 10*3/uL (ref 1.7–7.7)
Neutrophils Relative %: 70 %
Platelet Count: 244 10*3/uL (ref 150–400)
RBC: 3.25 MIL/uL — ABNORMAL LOW (ref 3.87–5.11)
RDW: 14 % (ref 11.5–15.5)
WBC Count: 4.8 10*3/uL (ref 4.0–10.5)
nRBC: 0 % (ref 0.0–0.2)

## 2020-07-30 LAB — CMP (CANCER CENTER ONLY)
ALT: 16 U/L (ref 0–44)
AST: 16 U/L (ref 15–41)
Albumin: 3.7 g/dL (ref 3.5–5.0)
Alkaline Phosphatase: 56 U/L (ref 38–126)
Anion gap: 9 (ref 5–15)
BUN: 9 mg/dL (ref 8–23)
CO2: 24 mmol/L (ref 22–32)
Calcium: 9.3 mg/dL (ref 8.9–10.3)
Chloride: 106 mmol/L (ref 98–111)
Creatinine: 0.66 mg/dL (ref 0.44–1.00)
GFR, Estimated: >60 mL/min (ref >60)
Glucose, Bld: 103 mg/dL — ABNORMAL HIGH (ref 70–99)
Potassium: 4.2 mmol/L (ref 3.5–5.1)
Sodium: 139 mmol/L (ref 135–145)
Total Bilirubin: 0.5 mg/dL (ref 0.3–1.2)
Total Protein: 6.5 g/dL (ref 6.5–8.1)

## 2020-07-30 MED ORDER — SODIUM CHLORIDE 0.9 % IV SOLN: 80.0000 mg/m2 | Freq: Once | INTRAVENOUS | Status: DC

## 2020-07-30 MED ORDER — DEXAMETHASONE SODIUM PHOSPHATE 10 MG/ML IJ SOLN
4.0000 mg | Freq: Once | INTRAMUSCULAR | Status: AC
  Administered 2020-07-30: 4 mg via INTRAVENOUS

## 2020-07-30 MED ORDER — DIPHENHYDRAMINE HCL 50 MG/ML IJ SOLN
50.0000 mg | Freq: Once | INTRAMUSCULAR | Status: AC
  Administered 2020-07-30: 50 mg via INTRAVENOUS

## 2020-07-30 MED ORDER — DEXAMETHASONE SODIUM PHOSPHATE 10 MG/ML IJ SOLN
INTRAMUSCULAR | Status: AC
  Filled 2020-07-30: qty 1

## 2020-07-30 MED ORDER — SODIUM CHLORIDE 0.9 % IV SOLN
80.0000 mg/m2 | Freq: Once | INTRAVENOUS | Status: AC
  Administered 2020-07-30: 144 mg via INTRAVENOUS
  Filled 2020-07-30: qty 24

## 2020-07-30 MED ORDER — FAMOTIDINE 20 MG IN NS 100 ML IVPB
20.0000 mg | Freq: Once | INTRAVENOUS | Status: AC
  Administered 2020-07-30: 20 mg via INTRAVENOUS

## 2020-07-30 MED ORDER — SODIUM CHLORIDE 0.9 % IV SOLN
Freq: Once | INTRAVENOUS | Status: AC
  Filled 2020-07-30: qty 250

## 2020-07-30 MED ORDER — FAMOTIDINE 20 MG IN NS 100 ML IVPB
INTRAVENOUS | Status: AC
  Filled 2020-07-30: qty 100

## 2020-07-30 MED ORDER — SODIUM CHLORIDE 0.9% FLUSH
10.0000 mL | Freq: Once | INTRAVENOUS | Status: AC
  Administered 2020-07-30: 10 mL
  Filled 2020-07-30: qty 10

## 2020-07-30 MED ORDER — DIPHENHYDRAMINE HCL 50 MG/ML IJ SOLN
INTRAMUSCULAR | Status: AC
  Filled 2020-07-30: qty 1

## 2020-08-05 NOTE — Progress Notes (Signed)
Patient Care Team: Deland Pretty, MD as PCP - General (Internal Medicine) Mauro Kaufmann, RN as Medical Oncologist Rockwell Germany, RN as Oncology Nurse Navigator  DIAGNOSIS:    ICD-10-CM   1. Malignant neoplasm of lower-outer quadrant of right breast of female, estrogen receptor positive ([place])  C50.511    Z17.0       SUMMARY OF ONCOLOGIC HISTORY: Oncology History  Malignant neoplasm of lower-outer quadrant of right breast of female, estrogen receptor positive ([place])  08/01/2019 Initial Diagnosis   Palpable right breast mass for 1 month.  Mammogram revealed a 5 cm mass 8:30 position, 3 cm intramammary lymph node at 10 o'clock position and 2 enlarged right axillary lymph nodes.  Biopsy revealed grade 1 IDC ER 90%, PR 10%, Ki-67 10%, HER-2 negative.  Intramammary node and axillary lymph node both positive with similar prognostic profile   08/08/2019 -  Neo-Adjuvant Anti-estrogen oral therapy   Anastrozole while deciding treatment plan    02/28/2020 Surgery   Bilateral mastectomies [name]):  Right breast: IDC, 6.5cm, metastatic carcinoma in 6/7 right axillary lymph nodes, 1.5cm, 1.5cm, and 0.3cm, and one intramammary lymph node, 2.5cm Left breast: no evidence of malignancy in the breast or 1 left axillary lymph node.   03/26/2020 -  Chemotherapy    Patient is on Treatment Plan: BREAST ADJUVANT DOSE DENSE AC Q14D / PACLITAXEL Q7D         CHIEF COMPLIANT: Cycle 12 Taxol  INTERVAL HISTORY: [name] is a 65 y.o. with above-mentioned history of right breast cancer treated with neoadjuvant antiestrogen therapy, bilateral mastectomies, and who is currently on adjuvant chemotherapy with Taxol after completing four cycles of dose dense Adriamcyin and Cytoxan. She presents today for a toxicity check and treatment.   ALLERGIES:  has No Known Allergies.  MEDICATIONS:  Current Outpatient Medications  Medication Sig Dispense Refill   calcium carbonate (OS-CAL) 1250 (500 Ca) MG  chewable tablet Chew 1 tablet by mouth daily.     cholecalciferol (VITAMIN D3) 25 MCG (1000 UNIT) tablet Take 1,000 Units by mouth daily.     doxycycline (MONODOX) 100 MG capsule Take 100 mg by mouth 2 (two) times daily.     levothyroxine (SYNTHROID) 125 MCG tablet Take 1 tablet (125 mcg total) by mouth daily before breakfast.     lidocaine-prilocaine (EMLA) cream Apply to affected area once 30 g 3   LORazepam (ATIVAN) 0.5 MG tablet Take 1 tablet (0.5 mg total) by mouth every 8 (eight) hours. 10 tablet 0   Multiple Vitamins-Minerals (MULTIVITAMIN WITH MINERALS) tablet Take 1 tablet by mouth daily.     ondansetron (ZOFRAN) 8 MG tablet Take 1 tablet (8 mg total) by mouth 2 (two) times daily as needed. Start on the third day after chemotherapy. 30 tablet 1   prochlorperazine (COMPAZINE) 10 MG tablet Take 1 tablet (10 mg total) by mouth every 6 (six) hours as needed (Nausea or vomiting). 30 tablet 1   rosuvastatin (CRESTOR) 10 MG tablet Take 1 tablet (10 mg total) by mouth daily.     No current facility-administered medications for this visit.    PHYSICAL EXAMINATION: ECOG PERFORMANCE STATUS: 1 - Symptomatic but completely ambulatory  There were no vitals filed for this visit. There were no vitals filed for this visit.   LABORATORY DATA:  I have reviewed the data as listed CMP Latest Ref Rng & Units 07/30/2020 07/23/2020 07/16/2020  Glucose 70 - 99 mg/dL 103(H) 114(H) 106(H)  BUN 8 - 23 mg/dL 9  9 10  Creatinine 0.44 - 1.00 mg/dL 0.66 0.66 0.65  Sodium 135 - 145 mmol/L 139 141 141  Potassium 3.5 - 5.1 mmol/L 4.2 3.9 3.9  Chloride 98 - 111 mmol/L 106 106 107  CO2 22 - 32 mmol/L 24 25 23  Calcium 8.9 - 10.3 mg/dL 9.3 9.1 9.2  Total Protein 6.5 - 8.1 g/dL 6.5 6.5 6.4(L)  Total Bilirubin 0.3 - 1.2 mg/dL 0.5 0.5 0.5  Alkaline Phos 38 - 126 U/L 56 54 50  AST 15 - 41 U/L 16 17 18  ALT 0 - 44 U/L 16 14 21    Lab Results  Component Value Date   WBC 4.8 07/30/2020   HGB 10.1 (L) 07/30/2020    HCT 29.1 (L) 07/30/2020   MCV 89.5 07/30/2020   PLT 244 07/30/2020   NEUTROABS 3.4 07/30/2020    ASSESSMENT & PLAN:  Malignant neoplasm of lower-outer quadrant of right breast of female, estrogen receptor positive ([facility]) 08/01/2019:Palpable right breast mass for 1 month.  Mammogram revealed a 5 cm mass 8:30 position, 3 cm intramammary lymph node at 10 o'clock position and 2 enlarged right axillary lymph nodes.  Biopsy revealed grade 1 IDC ER 90%, PR 10%, Ki-67 10%, HER-2 negative.  Intramammary node and axillary lymph node both positive with similar prognostic profile   Breast MRI 08/15/2019: Biopsy-proven malignancy lower outer quadrant right breast, non-mass enhancement spanning 8.7 cm with probable invasion of underlying pectoral muscle.  Biopsy-proven metastatic lymph node in the right axillary tail.  Mild asymmetric right infraclavicular adenopathy.    Biopsy positive for invasive ductal carcinoma grade 1   CT CAP 08/24/2019: Right axillary and right retropectoral adenopathy compatible with nodal metastases.  Indistinct sclerotic 1.2 cm left acetabular lesion.  No other distant metastases  MammaPrint: Luminal type A, low risk, no benefit to chemotherapy Bone scan: Negative for metastatic disease   Treatment Summary:  1. Neoadjuvant antiestrogen therapy with anastrozole 1 mg daily started 08/08/2019 2. bilateral mastectomies 02/28/2020: Right mastectomy with ALND: IDC 6.5 cm, 7/8 lymph nodes positive; left mastectomy: Benign ER 90%, PR 10%, Ki-67 10%, HER2 negative 3. given the fact that the patient had 7 positive lymph nodes, the standard of care would be to offer adjuvant chemotherapy.  I recommended dose dense Adriamycin and Cytoxan followed by Taxol. 4.  Adj radiation 5.  Followed by adjuvant antiestrogen therapy with letrozole and abemaciclib ----------------------------------------------------------------------------------------------------------------------------- Current Treatment:  Completed 4 cycles of Dose dense Adriamycin and Cytoxan, Today is cycle 12 Taxol   Chemo Toxicities: 1. Fatigue: Progressively worsening with each cycle of chemo, lately her energy levels have started to improve. 2. Chemo induced anemia:  Today's hemoglobin is 10.1     Monitoring closely for neuropathy.  Denies any current neuropathy symptoms. I sent a message to Dr. Toth to remove her port after her last cycle of chemo.   Return to clinic after XRT is complete      No orders of the defined types were placed in this encounter.  The patient has a good understanding of the overall plan. she agrees with it. she will call with any problems that may develop before the next visit here.  Total time spent: 30 mins including face to face time and time spent for planning, charting and coordination of care  Vinay K Gudena, MD, MPH 08/06/2020  I, Kirstyn Evans, am acting as scribe for Dr. Vinay Gudena.  I have reviewed the above documentation for accuracy and completeness, and I agree with   the above.       

## 2020-08-05 NOTE — Assessment & Plan Note (Signed)
08/01/2019:Palpable right breast mass for 1 month. Mammogram revealed a 5 cm mass 8:30 position, 3 cm intramammary lymph node at 10 o'clock position and 2 enlarged right axillary lymph nodes. Biopsy revealed grade 1 IDC ER 90%, PR 10%, Ki-67 10%, HER-2 negative. Intramammary node and axillary lymph node both positive with similar prognostic profile  Breast MRI 08/15/2019: Biopsy-proven malignancy lower outer quadrant right breast, non-mass enhancement spanning 8.7 cm with probable invasion of underlying pectoral muscle. Biopsy-proven metastatic lymph node in the right axillary tail. Mild asymmetric right infraclavicular adenopathy.Biopsy positive for invasive ductal carcinoma grade 1  CT CAP 08/24/2019: Right axillary and right retropectoral adenopathy compatible with nodal metastases. Indistinct sclerotic 1.2 cm left acetabular lesion. No other distant metastases MammaPrint: Luminal type A, low risk, no benefit to chemotherapy Bone scan: Negative for metastatic disease  TreatmentSummary: 1.Neoadjuvant antiestrogen therapy with anastrozole 1 mg daily started 08/08/2019 2.bilateral mastectomies1/13/2022: Right mastectomy with ALND: IDC 6.5 cm, 7/8 lymph nodes positive;left mastectomy: Benign ER 90%, PR 10%, Ki-67 10%, HER2 negative 3.given the fact that the patient had 7 positive lymph nodes, the standard of care would be to offer adjuvant chemotherapy. I recommended dose dense Adriamycin and Cytoxan followed by Taxol. 4.Adjradiation 5.Followed by adjuvant antiestrogen therapy with letrozole and abemaciclib ----------------------------------------------------------------------------------------------------------------------------- Current Treatment:Completed 4 cycles ofDose dense Adriamycin and Cytoxan, Today is cycle12Taxol  Chemo Toxicities: 1.Fatigue: Progressively worsening with each cycle of chemo, lately her energy levels have started to improve. 2.Chemo induced  anemia:Today's hemoglobin is   Monitoring closely for neuropathy.Denies any current neuropathy symptoms. I sent a message to Dr. [name] to remove her port after her last cycle of chemo.  Return to clinic after XRT is complete

## 2020-08-06 ENCOUNTER — Inpatient Hospital Stay: Payer: Commercial Managed Care - PPO

## 2020-08-06 ENCOUNTER — Other Ambulatory Visit: Payer: Self-pay

## 2020-08-06 ENCOUNTER — Inpatient Hospital Stay (HOSPITAL_BASED_OUTPATIENT_CLINIC_OR_DEPARTMENT_OTHER): Payer: Commercial Managed Care - PPO | Admitting: Hematology and Oncology

## 2020-08-06 ENCOUNTER — Encounter: Payer: Self-pay | Admitting: *Deleted

## 2020-08-06 DIAGNOSIS — Z95828 Presence of other vascular implants and grafts: Secondary | ICD-10-CM

## 2020-08-06 DIAGNOSIS — Z170 Estrogen receptor positive status [ER+]: Secondary | ICD-10-CM | POA: Diagnosis not present

## 2020-08-06 DIAGNOSIS — C50511 Malignant neoplasm of lower-outer quadrant of right female breast: Secondary | ICD-10-CM

## 2020-08-06 DIAGNOSIS — Z5111 Encounter for antineoplastic chemotherapy: Secondary | ICD-10-CM | POA: Diagnosis not present

## 2020-08-06 LAB — CMP (CANCER CENTER ONLY)
ALT: 17 U/L (ref 0–44)
AST: 19 U/L (ref 15–41)
Albumin: 3.8 g/dL (ref 3.5–5.0)
Alkaline Phosphatase: 52 U/L (ref 38–126)
Anion gap: 9 (ref 5–15)
BUN: 10 mg/dL (ref 8–23)
CO2: 25 mmol/L (ref 22–32)
Calcium: 9.6 mg/dL (ref 8.9–10.3)
Chloride: 107 mmol/L (ref 98–111)
Creatinine: 0.62 mg/dL (ref 0.44–1.00)
GFR, Estimated: >60 mL/min (ref >60)
Glucose, Bld: 107 mg/dL — ABNORMAL HIGH (ref 70–99)
Potassium: 4.1 mmol/L (ref 3.5–5.1)
Sodium: 141 mmol/L (ref 135–145)
Total Bilirubin: 0.5 mg/dL (ref 0.3–1.2)
Total Protein: 6.4 g/dL — ABNORMAL LOW (ref 6.5–8.1)

## 2020-08-06 LAB — CBC WITH DIFFERENTIAL (CANCER CENTER ONLY)
Abs Immature Granulocytes: 0.07 10*3/uL (ref 0.00–0.07)
Basophils Absolute: 0.1 10*3/uL (ref 0.0–0.1)
Basophils Relative: 1 %
Eosinophils Absolute: 0.1 10*3/uL (ref 0.0–0.5)
Eosinophils Relative: 2 %
HCT: 29.3 % — ABNORMAL LOW (ref 36.0–46.0)
Hemoglobin: 10.1 g/dL — ABNORMAL LOW (ref 12.0–15.0)
Immature Granulocytes: 1 %
Lymphocytes Relative: 13 %
Lymphs Abs: 0.8 10*3/uL (ref 0.7–4.0)
MCH: 30.4 pg (ref 26.0–34.0)
MCHC: 34.5 g/dL (ref 30.0–36.0)
MCV: 88.3 fL (ref 80.0–100.0)
Monocytes Absolute: 0.7 10*3/uL (ref 0.1–1.0)
Monocytes Relative: 11 %
Neutro Abs: 4.1 10*3/uL (ref 1.7–7.7)
Neutrophils Relative %: 72 %
Platelet Count: 254 10*3/uL (ref 150–400)
RBC: 3.32 MIL/uL — ABNORMAL LOW (ref 3.87–5.11)
RDW: 14.1 % (ref 11.5–15.5)
WBC Count: 5.8 10*3/uL (ref 4.0–10.5)
nRBC: 0 % (ref 0.0–0.2)

## 2020-08-06 MED ORDER — COLD PACK MISC ONCOLOGY
1.0000 | Freq: Once | Status: DC | PRN
  Filled 2020-08-06: qty 1

## 2020-08-06 MED ORDER — DEXAMETHASONE SODIUM PHOSPHATE 10 MG/ML IJ SOLN
INTRAMUSCULAR | Status: AC
  Filled 2020-08-06: qty 1

## 2020-08-06 MED ORDER — FAMOTIDINE 20 MG IN NS 100 ML IVPB
INTRAVENOUS | Status: AC
  Filled 2020-08-06: qty 100

## 2020-08-06 MED ORDER — DIPHENHYDRAMINE HCL 50 MG/ML IJ SOLN
INTRAMUSCULAR | Status: AC
  Filled 2020-08-06: qty 1

## 2020-08-06 MED ORDER — DEXAMETHASONE SODIUM PHOSPHATE 10 MG/ML IJ SOLN
4.0000 mg | Freq: Once | INTRAMUSCULAR | Status: AC
  Administered 2020-08-06: 4 mg via INTRAVENOUS

## 2020-08-06 MED ORDER — FAMOTIDINE 20 MG IN NS 100 ML IVPB
20.0000 mg | Freq: Once | INTRAVENOUS | Status: AC
  Administered 2020-08-06: 20 mg via INTRAVENOUS

## 2020-08-06 MED ORDER — SODIUM CHLORIDE 0.9 % IV SOLN
Freq: Once | INTRAVENOUS | Status: AC
  Filled 2020-08-06: qty 250

## 2020-08-06 MED ORDER — SODIUM CHLORIDE 0.9 % IV SOLN
80.0000 mg/m2 | Freq: Once | INTRAVENOUS | Status: AC
  Administered 2020-08-06: 144 mg via INTRAVENOUS
  Filled 2020-08-06: qty 24

## 2020-08-06 MED ORDER — DIPHENHYDRAMINE HCL 50 MG/ML IJ SOLN
50.0000 mg | Freq: Once | INTRAMUSCULAR | Status: AC
  Administered 2020-08-06: 50 mg via INTRAVENOUS

## 2020-08-06 MED ORDER — SODIUM CHLORIDE 0.9% FLUSH
10.0000 mL | Freq: Once | INTRAVENOUS | Status: AC
  Administered 2020-08-06: 10 mL
  Filled 2020-08-06: qty 10

## 2020-08-06 MED ORDER — SODIUM CHLORIDE 0.9% FLUSH
10.0000 mL | INTRAVENOUS | Status: DC | PRN
  Administered 2020-08-06: 10 mL
  Filled 2020-08-06: qty 10

## 2020-08-06 MED ORDER — HEPARIN SOD (PORK) LOCK FLUSH 100 UNIT/ML IV SOLN
500.0000 [IU] | Freq: Once | INTRAVENOUS | Status: AC | PRN
  Administered 2020-08-06: 500 [IU]
  Filled 2020-08-06: qty 5

## 2020-08-06 NOTE — Patient Instructions (Signed)
[facility]  Discharge Instructions: Thank you for choosing [facility] to provide your oncology and hematology care.   If you have a lab appointment with the Cancer Center, please go directly to the Cancer Center and check in at the registration area.   Wear comfortable clothing and clothing appropriate for easy access to any Portacath or PICC line.   We strive to give you quality time with your provider. You may need to reschedule your appointment if you arrive late (15 or more minutes).  Arriving late affects you and other patients whose appointments are after yours.  Also, if you miss three or more appointments without notifying the office, you may be dismissed from the clinic at the provider's discretion.      For prescription refill requests, have your pharmacy contact our office and allow 72 hours for refills to be completed.    Today you received the following chemotherapy and/or immunotherapy agent: Paclitaxel (Taxol)   To help prevent nausea and vomiting after your treatment, we encourage you to take your nausea medication as directed.  BELOW ARE SYMPTOMS THAT SHOULD BE REPORTED IMMEDIATELY: *FEVER GREATER THAN 100.4 F (38 C) OR HIGHER *CHILLS OR SWEATING *NAUSEA AND VOMITING THAT IS NOT CONTROLLED WITH YOUR NAUSEA MEDICATION *UNUSUAL SHORTNESS OF BREATH *UNUSUAL BRUISING OR BLEEDING *URINARY PROBLEMS (pain or burning when urinating, or frequent urination) *BOWEL PROBLEMS (unusual diarrhea, constipation, pain near the anus) TENDERNESS IN MOUTH AND THROAT WITH OR WITHOUT PRESENCE OF ULCERS (sore throat, sores in mouth, or a toothache) UNUSUAL RASH, SWELLING OR PAIN  UNUSUAL VAGINAL DISCHARGE OR ITCHING   Items with * indicate a potential emergency and should be followed up as soon as possible or go to the Emergency Department if any problems should occur.  Please show the CHEMOTHERAPY ALERT CARD or IMMUNOTHERAPY ALERT CARD at  check-in to the Emergency Department and triage nurse.  Should you have questions after your visit or need to cancel or reschedule your appointment, please contact [facility]  Dept: [phone]  and follow the prompts.  Office hours are 8:00 a.m. to 4:30 p.m. Monday - Friday. Please note that voicemails left after 4:00 p.m. may not be returned until the following business day.  We are closed weekends and major holidays. You have access to a nurse at all times for urgent questions. Please call the main number to the clinic Dept: [phone] and follow the prompts.   For any non-urgent questions, you may also contact your provider using MyChart. We now offer e-Visits for anyone 18 and older to request care online for non-urgent symptoms. For details visit [email or web address].   Also download the MyChart app! Go to the app store, search "MyChart", open the app, select [facility], and log in with your MyChart username and password.  Due to Covid, a mask is required upon entering the hospital/clinic. If you do not have a mask, one will be given to you upon arrival. For doctor visits, patients may have 1 support person aged 18 or older with them. For treatment visits, patients cannot have anyone with them due to current Covid guidelines and our immunocompromised population.  

## 2020-08-08 ENCOUNTER — Ambulatory Visit: Payer: Self-pay | Admitting: General Surgery

## 2020-08-25 NOTE — Progress Notes (Signed)
New Breast Cancer Diagnosis: Right Breast  Did patient present with symptoms (if so, please note symptoms) or screening mammography?:Palpable mass    Location and Extent of disease :right breast. Located at 8:30 position, measured  5 cm in greatest dimension.  3 cm intramammary lymph node at 10 o'clock position and 2 enlarged right axillary lymph nodes.  Histology per Pathology Report: grade 1, Invasive Ductal Carcinoma 02/28/2020  Receptor Status: ER(positive), PR (positive), Her2-neu (negative), Ki-(10%)  Surgeon and surgical plan, if any: Dr. [name] 02/28/2020 -Bilateral Mastectomy with right SLN mapping  Medical oncologist, treatment if any:   Dr. Lindi Adie 08/06/2020 Treatment Summary:  1. Neoadjuvant antiestrogen therapy with anastrozole 1 mg daily started 08/08/2019 2. bilateral mastectomies 02/28/2020: Right mastectomy with ALND: IDC 6.5 cm, 7/8 lymph nodes positive; left mastectomy: Benign ER 90%, PR 10%, Ki-67 10%, HER2 negative 3. given the fact that the patient had 7 positive lymph nodes, the standard of care would be to offer adjuvant chemotherapy.  I recommended dose dense Adriamycin and Cytoxan followed by Taxol. 4.  Adj radiation 5.  Followed by adjuvant antiestrogen therapy with letrozole and abemaciclib   Family History of Breast/Ovarian/Prostate Cancer: Paternal Grandmother had Breast cancer- diagnosed in early 80's  Lymphedema issues, if any: No    Pain issues, if any: No  SAFETY ISSUES: Prior radiation? No Pacemaker/ICD? No Possible current pregnancy? Postmenopausal Is the patient on methotrexate? No  Current Complaints / other details:   -Possible Port Removal 10/13/2020

## 2020-08-26 ENCOUNTER — Ambulatory Visit
Admission: RE | Admit: 2020-08-26 | Discharge: 2020-08-26 | Disposition: A | Discharge status: Home / Self Care | Payer: Commercial Managed Care - PPO | Source: Ambulatory Visit | Attending: Radiation Oncology | Admitting: Radiation Oncology

## 2020-08-26 ENCOUNTER — Encounter: Payer: Self-pay | Admitting: Radiation Oncology

## 2020-08-26 ENCOUNTER — Other Ambulatory Visit: Payer: Self-pay

## 2020-08-26 VITALS — BP 102/76 | HR 87 | Temp 96.8°F | Resp 18 | Ht 67.0 in | Wt 147.0 lb

## 2020-08-26 DIAGNOSIS — E039 Hypothyroidism, unspecified: Secondary | ICD-10-CM | POA: Insufficient documentation

## 2020-08-26 DIAGNOSIS — E785 Hyperlipidemia, unspecified: Secondary | ICD-10-CM | POA: Diagnosis not present

## 2020-08-26 DIAGNOSIS — Z79899 Other long term (current) drug therapy: Secondary | ICD-10-CM | POA: Diagnosis not present

## 2020-08-26 DIAGNOSIS — C50511 Malignant neoplasm of lower-outer quadrant of right female breast: Secondary | ICD-10-CM | POA: Insufficient documentation

## 2020-08-26 DIAGNOSIS — Z170 Estrogen receptor positive status [ER+]: Secondary | ICD-10-CM

## 2020-08-26 DIAGNOSIS — H0012 Chalazion right lower eyelid: Secondary | ICD-10-CM | POA: Insufficient documentation

## 2020-08-26 NOTE — Progress Notes (Signed)
Radiation Oncology         [phone] ________________________________  Name: [name]        MRN: [id number]  Date of Service: 08/26/2020 DOB: [birth year]  WU:[id number], Thayer Jew, MD  [name], MD     REFERRING PHYSICIAN: [name], MD   DIAGNOSIS: The encounter diagnosis was Malignant neoplasm of lower-outer quadrant of right breast of female, estrogen receptor positive ([place]).   HISTORY OF PRESENT ILLNESS: [name] is a 65 y.o. female seen in for a diagnosis of right breast cancer. The patient was noted to have a palpable mass in the right breast, work-up included mammography showing a 5 cm mass in the 8:30 position s a 3 cm intramammary lymph node at 10:00 and 2 enlarged axillary lymph nodes biopsies at that time on 08/01/2019 showed a grade 1 invasive ductal carcinoma that was ER positive PR positive HER2 negative with a Ki-67 of 10% her intramammary node and axillary nodes were positive as well she went on to receive neoadjuvant antiestrogen therapy with anastrozole.  She ultimately proceeded with bilateral mastectomies on 02/28/2020. Pathology revealed benign tissue with fibroadenomatous changes in the left breast a 6.5 cm invasive ductal carcinoma that was grade process margin was 1 mm anteriorly, 8 lymph nodes were sampled on 4 of those were sentinel lymph nodes, 7 of the nodes were involved with carcinoma.  She was counseled on adjuvant chemotherapy which she began on 03/26/2020, she completed her 12th cycle of Taxol on 08/06/2020.  She is seen today to discuss adjuvant radiotherapy    PREVIOUS RADIATION THERAPY: No   PAST MEDICAL HISTORY:  Past Medical History:  Diagnosis Date   Complication of anesthesia    Dyslipidemia    Hypothyroidism    Pneumonia    PONV (postoperative nausea and vomiting)    Thyroid cancer ([place])        PAST SURGICAL HISTORY: Past Surgical History:  Procedure Laterality Date   MASTECTOMY WITH AXILLARY LYMPH NODE DISSECTION Bilateral  02/28/2020   Procedure: BILATERAL MASTECTOMY WITH RIGHT SENTINEL LYMPH NODE MAPPING AND RIGHT TARGETED RADIOACTIVE SEED GUIDED  LYMPH NODE DISSECTION;  Surgeon: Jovita Kussmaul, MD;  Location: [facility];  Service: General;  Laterality: Bilateral;  PEC BLOCK, RNFA (SHARON HITCHCOCK)   PORTACATH PLACEMENT Left 03/21/2020   Procedure: PORT PLACEMENT WITH ULTRASOUND;  Surgeon: Jovita Kussmaul, MD;  Location: [facility];  Service: General;  Laterality: Left;   THYROIDECTOMY       FAMILY HISTORY:  Family History  Problem Relation Age of Onset   Breast cancer Paternal Grandmother 60     SOCIAL HISTORY:  reports that she has never smoked. She has never used smokeless tobacco. She reports that she does not drink alcohol and does not use drugs.  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    ALLERGIES: Patient has no known allergies.   MEDICATIONS:  Current Outpatient Medications  Medication Sig Dispense Refill   calcium carbonate (OS-CAL) 1250 (500 Ca) MG chewable tablet Chew 1 tablet by mouth daily.     cholecalciferol (VITAMIN D3) 25 MCG (1000 UNIT) tablet Take 1,000 Units by mouth daily.     doxycycline (MONODOX) 100 MG capsule Take 100 mg by mouth 2 (two) times daily.     levothyroxine (SYNTHROID) 125 MCG tablet Take 1 tablet (125 mcg total) by mouth daily before breakfast.     lidocaine-prilocaine (EMLA) cream  Apply to affected area once 30 g 3   LORazepam (ATIVAN) 0.5 MG tablet Take 1 tablet (0.5 mg total) by mouth every 8 (eight) hours. 10 tablet 0   Multiple Vitamins-Minerals (MULTIVITAMIN WITH MINERALS) tablet Take 1 tablet by mouth daily.     ondansetron (ZOFRAN) 8 MG tablet Take 1 tablet (8 mg total) by mouth 2 (two) times daily as needed. Start on the third day after chemotherapy. 30 tablet 1   prochlorperazine (COMPAZINE) 10 MG tablet Take 1 tablet (10 mg total) by mouth every 6 (six) hours as needed  (Nausea or vomiting). 30 tablet 1   rosuvastatin (CRESTOR) 10 MG tablet Take 1 tablet (10 mg total) by mouth daily.     No current facility-administered medications for this encounter.     REVIEW OF SYSTEMS: On review of systems, the patient reports that she is doing well overall. She reports she did very well with chemotherapy. She's been battling frequent chalazions in her eyelids and believes these have been caused by the coconut oil she's used on her skin. She feels the area is improved in the right eyelid with warm compresses. She feels like she's otherwise done well since chemotherapy and reports redundant skin in her mastectomy scar line that she would like to revisit. No other complaints are verbalized.     PHYSICAL EXAM:  Wt Readings from Last 3 Encounters:  08/06/20 146 lb 8 oz (66.5 kg)  07/30/20 144 lb 8 oz (65.5 kg)  07/23/20 147 lb (66.7 kg)   Temp Readings from Last 3 Encounters:  08/06/20 (!) 97.3 F (36.3 C) (Tympanic)  07/30/20 98.1 F (36.7 C) (Oral)  07/23/20 97.7 F (36.5 C) (Temporal)   BP Readings from Last 3 Encounters:  08/06/20 124/67  07/30/20 129/72  07/23/20 114/63   Pulse Readings from Last 3 Encounters:  08/06/20 94  07/30/20 87  07/23/20 89    In general this is a well appearing caucasian female in no acute distress. She's alert and oriented x4 and appropriate throughout the examination. She has an erythematous lesion consistent with chalazion of her right lower eye lid centrally. No scleral erythema is noted. Cardiopulmonary assessment is negative for acute distress and she exhibits normal effort. Bilateral mastectomy scars are well healed with redundant skin on the left side greater than right. No erythema or fluctuance is noted.    ECOG = 1  0 - Asymptomatic (Fully active, able to carry on all predisease activities without restriction)  1 - Symptomatic but completely ambulatory (Restricted in physically strenuous activity but ambulatory  and able to carry out work of a light or sedentary nature. For example, light housework, office work)  2 - Symptomatic, <50% in bed during the day (Ambulatory and capable of all self care but unable to carry out any work activities. Up and about more than 50% of waking hours)  3 - Symptomatic, >50% in bed, but not bedbound (Capable of only limited self-care, confined to bed or chair 50% or more of waking hours)  4 - Bedbound (Completely disabled. Cannot carry on any self-care. Totally confined to bed or chair)  5 - Death   Eustace Pen MM, Creech RH, Tormey DC, et al. [phone]). "Toxicity and response criteria of the [facility]". [place] Oncol. 5 (6): 649-55    LABORATORY DATA:  Lab Results  Component Value Date   WBC 5.8 08/06/2020   HGB 10.1 (L) 08/06/2020   HCT 29.3 (L) 08/06/2020   MCV 88.3  08/06/2020   PLT 254 08/06/2020   Lab Results  Component Value Date   NA 141 08/06/2020   K 4.1 08/06/2020   CL 107 08/06/2020   CO2 25 08/06/2020   Lab Results  Component Value Date   ALT 17 08/06/2020   AST 19 08/06/2020   ALKPHOS 52 08/06/2020   BILITOT 0.5 08/06/2020      RADIOGRAPHY: No results found.     IMPRESSION/PLAN: 1. Stage IIA, cT2N1M0 grade 1, ER/PR positive invasive ductal carcinoma of the right breast. Dr. Lisbeth Renshaw discusses the pathology findings and reviews the nature of locally advanced breast disease. Dr. Lisbeth Renshaw discusses her course as well and reviews the rationale for external radiotherapy to the breast  to reduce risks of local recurrence followed by antiestrogen therapy. We discussed the risks, benefits, short, and long term effects of radiotherapy, as well as the curative intent, and the patient is interested in proceeding. Dr. Lisbeth Renshaw discusses the delivery and logistics of radiotherapy and anticipates a course of 6 1/2 weeks of radiotherapy to the right chest wall and regional nodes. Written consent is obtained and placed in the chart, a copy  was provided to the patient. She will simulate on 09/03/20 at 2pm.  2. Chalazion. She will continue warm compresses and follow up with her PCP if progressive symptoms occur.   In a visit lasting 60 minutes, greater than 50% of the time was spent face to face reviewing her case, as well as in preparation of, discussing, and coordinating the patient's care.  The above documentation reflects my direct findings during this shared patient visit. Please see the separate note by Dr. Lisbeth Renshaw on this date for the remainder of the patient's plan of care.    Carola Rhine, [facility]    **Disclaimer: This note was dictated with voice recognition software. Similar sounding words can inadvertently be transcribed and this note may contain transcription errors which may not have been corrected upon publication of note.**

## 2020-09-01 ENCOUNTER — Encounter: Payer: Self-pay | Admitting: *Deleted

## 2020-09-03 ENCOUNTER — Ambulatory Visit
Admission: RE | Admit: 2020-09-03 | Discharge: 2020-09-03 | Disposition: A | Discharge status: Home / Self Care | Payer: Commercial Managed Care - PPO | Source: Ambulatory Visit | Attending: Radiation Oncology | Admitting: Radiation Oncology

## 2020-09-03 ENCOUNTER — Other Ambulatory Visit: Payer: Self-pay

## 2020-09-03 DIAGNOSIS — Z170 Estrogen receptor positive status [ER+]: Secondary | ICD-10-CM | POA: Insufficient documentation

## 2020-09-03 DIAGNOSIS — Z510 Encounter for antineoplastic radiation therapy: Secondary | ICD-10-CM | POA: Diagnosis present

## 2020-09-03 DIAGNOSIS — C50511 Malignant neoplasm of lower-outer quadrant of right female breast: Secondary | ICD-10-CM | POA: Diagnosis present

## 2020-09-03 DIAGNOSIS — C773 Secondary and unspecified malignant neoplasm of axilla and upper limb lymph nodes: Secondary | ICD-10-CM | POA: Insufficient documentation

## 2020-09-04 DIAGNOSIS — Z510 Encounter for antineoplastic radiation therapy: Secondary | ICD-10-CM | POA: Diagnosis not present

## 2020-09-08 ENCOUNTER — Encounter: Payer: Self-pay | Admitting: *Deleted

## 2020-09-08 ENCOUNTER — Ambulatory Visit
Admission: RE | Admit: 2020-09-08 | Discharge: 2020-09-08 | Disposition: A | Discharge status: Home / Self Care | Payer: Commercial Managed Care - PPO | Source: Ambulatory Visit | Attending: Radiation Oncology | Admitting: Radiation Oncology

## 2020-09-08 ENCOUNTER — Other Ambulatory Visit: Payer: Self-pay

## 2020-09-08 DIAGNOSIS — Z510 Encounter for antineoplastic radiation therapy: Secondary | ICD-10-CM | POA: Diagnosis not present

## 2020-09-09 ENCOUNTER — Ambulatory Visit
Admission: RE | Admit: 2020-09-09 | Discharge: 2020-09-09 | Disposition: A | Discharge status: Home / Self Care | Payer: Commercial Managed Care - PPO | Source: Ambulatory Visit | Attending: Radiation Oncology | Admitting: Radiation Oncology

## 2020-09-09 DIAGNOSIS — Z510 Encounter for antineoplastic radiation therapy: Secondary | ICD-10-CM | POA: Diagnosis not present

## 2020-09-10 ENCOUNTER — Telehealth: Payer: Self-pay | Admitting: Hematology and Oncology

## 2020-09-10 ENCOUNTER — Ambulatory Visit: Payer: Commercial Managed Care - PPO

## 2020-09-10 NOTE — Telephone Encounter (Signed)
Scheduled appt per 7/25 sch msg. Pt aware.  

## 2020-09-11 ENCOUNTER — Other Ambulatory Visit: Payer: Self-pay

## 2020-09-11 ENCOUNTER — Ambulatory Visit
Admission: RE | Admit: 2020-09-11 | Discharge: 2020-09-11 | Disposition: A | Discharge status: Home / Self Care | Payer: Commercial Managed Care - PPO | Source: Ambulatory Visit | Attending: Radiation Oncology | Admitting: Radiation Oncology

## 2020-09-11 DIAGNOSIS — Z510 Encounter for antineoplastic radiation therapy: Secondary | ICD-10-CM | POA: Diagnosis not present

## 2020-09-11 NOTE — Progress Notes (Signed)
Pt here for patient teaching.  Pt given Radiation and You booklet, skin care instructions, Alra deodorant, and Radiaplex gel.  Reviewed areas of pertinence such as fatigue, hair loss, skin changes, breast tenderness, and breast swelling . Pt able to give teach back of to pat skin and use unscented/gentle soap,apply Radiaplex bid, avoid applying anything to skin within 4 hours of treatment, avoid wearing an under wire bra, and to use an electric razor if they must shave. Pt verbalizes understanding of information given and will contact nursing with any questions or concerns.     Http://rtanswers.org/treatmentinformation/whattoexpect/index  Minerva Bluett M. Zyaire Mccleod RN, BSN       

## 2020-09-12 ENCOUNTER — Ambulatory Visit
Admission: RE | Admit: 2020-09-12 | Discharge: 2020-09-12 | Disposition: A | Discharge status: Home / Self Care | Payer: Commercial Managed Care - PPO | Source: Ambulatory Visit | Attending: Radiation Oncology | Admitting: Radiation Oncology

## 2020-09-12 DIAGNOSIS — Z170 Estrogen receptor positive status [ER+]: Secondary | ICD-10-CM

## 2020-09-12 DIAGNOSIS — Z510 Encounter for antineoplastic radiation therapy: Secondary | ICD-10-CM | POA: Diagnosis not present

## 2020-09-12 DIAGNOSIS — C50511 Malignant neoplasm of lower-outer quadrant of right female breast: Secondary | ICD-10-CM

## 2020-09-12 MED ORDER — RADIAPLEXRX EX GEL: Freq: Once | CUTANEOUS | Status: AC

## 2020-09-12 MED ORDER — ALRA NON-METALLIC DEODORANT (RAD-ONC)
1.0000 "application " | Freq: Once | TOPICAL | Status: AC
  Administered 2020-09-12: 1 via TOPICAL

## 2020-09-15 ENCOUNTER — Ambulatory Visit
Admission: RE | Admit: 2020-09-15 | Discharge: 2020-09-15 | Disposition: A | Discharge status: Home / Self Care | Payer: Commercial Managed Care - PPO | Source: Ambulatory Visit | Attending: Radiation Oncology | Admitting: Radiation Oncology

## 2020-09-15 ENCOUNTER — Other Ambulatory Visit: Payer: Self-pay

## 2020-09-15 DIAGNOSIS — Z510 Encounter for antineoplastic radiation therapy: Secondary | ICD-10-CM | POA: Diagnosis present

## 2020-09-15 DIAGNOSIS — Z170 Estrogen receptor positive status [ER+]: Secondary | ICD-10-CM | POA: Insufficient documentation

## 2020-09-15 DIAGNOSIS — C773 Secondary and unspecified malignant neoplasm of axilla and upper limb lymph nodes: Secondary | ICD-10-CM | POA: Insufficient documentation

## 2020-09-15 DIAGNOSIS — C50511 Malignant neoplasm of lower-outer quadrant of right female breast: Secondary | ICD-10-CM | POA: Diagnosis present

## 2020-09-16 ENCOUNTER — Ambulatory Visit
Admission: RE | Admit: 2020-09-16 | Discharge: 2020-09-16 | Disposition: A | Discharge status: Home / Self Care | Payer: Commercial Managed Care - PPO | Source: Ambulatory Visit | Attending: Radiation Oncology | Admitting: Radiation Oncology

## 2020-09-16 DIAGNOSIS — Z510 Encounter for antineoplastic radiation therapy: Secondary | ICD-10-CM | POA: Diagnosis not present

## 2020-09-17 ENCOUNTER — Ambulatory Visit
Admission: RE | Admit: 2020-09-17 | Discharge: 2020-09-17 | Disposition: A | Discharge status: Home / Self Care | Payer: Commercial Managed Care - PPO | Source: Ambulatory Visit | Attending: Radiation Oncology | Admitting: Radiation Oncology

## 2020-09-17 ENCOUNTER — Other Ambulatory Visit: Payer: Self-pay

## 2020-09-17 DIAGNOSIS — Z510 Encounter for antineoplastic radiation therapy: Secondary | ICD-10-CM | POA: Diagnosis not present

## 2020-09-18 ENCOUNTER — Ambulatory Visit
Admission: RE | Admit: 2020-09-18 | Discharge: 2020-09-18 | Disposition: A | Discharge status: Home / Self Care | Payer: Commercial Managed Care - PPO | Source: Ambulatory Visit | Attending: Radiation Oncology | Admitting: Radiation Oncology

## 2020-09-18 DIAGNOSIS — Z510 Encounter for antineoplastic radiation therapy: Secondary | ICD-10-CM | POA: Diagnosis not present

## 2020-09-19 ENCOUNTER — Other Ambulatory Visit: Payer: Self-pay

## 2020-09-19 ENCOUNTER — Ambulatory Visit
Admission: RE | Admit: 2020-09-19 | Discharge: 2020-09-19 | Disposition: A | Discharge status: Home / Self Care | Payer: Commercial Managed Care - PPO | Source: Ambulatory Visit | Attending: Radiation Oncology | Admitting: Radiation Oncology

## 2020-09-19 DIAGNOSIS — Z510 Encounter for antineoplastic radiation therapy: Secondary | ICD-10-CM | POA: Diagnosis not present

## 2020-09-22 ENCOUNTER — Ambulatory Visit
Admission: RE | Admit: 2020-09-22 | Discharge: 2020-09-22 | Disposition: A | Discharge status: Home / Self Care | Payer: Commercial Managed Care - PPO | Source: Ambulatory Visit | Attending: Radiation Oncology | Admitting: Radiation Oncology

## 2020-09-22 ENCOUNTER — Other Ambulatory Visit: Payer: Self-pay

## 2020-09-22 DIAGNOSIS — Z510 Encounter for antineoplastic radiation therapy: Secondary | ICD-10-CM | POA: Diagnosis not present

## 2020-09-23 ENCOUNTER — Ambulatory Visit
Admission: RE | Admit: 2020-09-23 | Discharge: 2020-09-23 | Disposition: A | Discharge status: Home / Self Care | Payer: Commercial Managed Care - PPO | Source: Ambulatory Visit | Attending: Radiation Oncology | Admitting: Radiation Oncology

## 2020-09-23 DIAGNOSIS — Z510 Encounter for antineoplastic radiation therapy: Secondary | ICD-10-CM | POA: Diagnosis not present

## 2020-09-24 ENCOUNTER — Other Ambulatory Visit: Payer: Self-pay

## 2020-09-24 ENCOUNTER — Ambulatory Visit
Admission: RE | Admit: 2020-09-24 | Discharge: 2020-09-24 | Disposition: A | Discharge status: Home / Self Care | Payer: Commercial Managed Care - PPO | Source: Ambulatory Visit | Attending: Radiation Oncology | Admitting: Radiation Oncology

## 2020-09-24 DIAGNOSIS — Z510 Encounter for antineoplastic radiation therapy: Secondary | ICD-10-CM | POA: Diagnosis not present

## 2020-09-25 ENCOUNTER — Ambulatory Visit
Admission: RE | Admit: 2020-09-25 | Discharge: 2020-09-25 | Disposition: A | Discharge status: Home / Self Care | Payer: Commercial Managed Care - PPO | Source: Ambulatory Visit | Attending: Radiation Oncology | Admitting: Radiation Oncology

## 2020-09-25 DIAGNOSIS — Z510 Encounter for antineoplastic radiation therapy: Secondary | ICD-10-CM | POA: Diagnosis not present

## 2020-09-26 ENCOUNTER — Other Ambulatory Visit: Payer: Self-pay

## 2020-09-26 ENCOUNTER — Ambulatory Visit
Admission: RE | Admit: 2020-09-26 | Discharge: 2020-09-26 | Disposition: A | Discharge status: Home / Self Care | Payer: Commercial Managed Care - PPO | Source: Ambulatory Visit | Attending: Radiation Oncology | Admitting: Radiation Oncology

## 2020-09-26 DIAGNOSIS — Z510 Encounter for antineoplastic radiation therapy: Secondary | ICD-10-CM | POA: Diagnosis not present

## 2020-09-29 ENCOUNTER — Ambulatory Visit
Admission: RE | Admit: 2020-09-29 | Discharge: 2020-09-29 | Disposition: A | Discharge status: Home / Self Care | Payer: Commercial Managed Care - PPO | Source: Ambulatory Visit | Attending: Radiation Oncology | Admitting: Radiation Oncology

## 2020-09-29 ENCOUNTER — Other Ambulatory Visit: Payer: Self-pay

## 2020-09-29 DIAGNOSIS — Z510 Encounter for antineoplastic radiation therapy: Secondary | ICD-10-CM | POA: Diagnosis not present

## 2020-09-30 ENCOUNTER — Ambulatory Visit
Admission: RE | Admit: 2020-09-30 | Discharge: 2020-09-30 | Disposition: A | Discharge status: Home / Self Care | Payer: Commercial Managed Care - PPO | Source: Ambulatory Visit | Attending: Radiation Oncology | Admitting: Radiation Oncology

## 2020-09-30 DIAGNOSIS — Z510 Encounter for antineoplastic radiation therapy: Secondary | ICD-10-CM | POA: Diagnosis not present

## 2020-10-01 ENCOUNTER — Ambulatory Visit
Admission: RE | Admit: 2020-10-01 | Discharge: 2020-10-01 | Disposition: A | Discharge status: Home / Self Care | Payer: Commercial Managed Care - PPO | Source: Ambulatory Visit | Attending: Radiation Oncology | Admitting: Radiation Oncology

## 2020-10-01 ENCOUNTER — Other Ambulatory Visit: Payer: Self-pay

## 2020-10-01 DIAGNOSIS — Z510 Encounter for antineoplastic radiation therapy: Secondary | ICD-10-CM | POA: Diagnosis not present

## 2020-10-02 ENCOUNTER — Ambulatory Visit
Admission: RE | Admit: 2020-10-02 | Discharge: 2020-10-02 | Disposition: A | Discharge status: Home / Self Care | Payer: Commercial Managed Care - PPO | Source: Ambulatory Visit | Attending: Radiation Oncology | Admitting: Radiation Oncology

## 2020-10-02 DIAGNOSIS — Z510 Encounter for antineoplastic radiation therapy: Secondary | ICD-10-CM | POA: Diagnosis not present

## 2020-10-03 ENCOUNTER — Ambulatory Visit
Admission: RE | Admit: 2020-10-03 | Discharge: 2020-10-03 | Disposition: A | Discharge status: Home / Self Care | Payer: Commercial Managed Care - PPO | Source: Ambulatory Visit | Attending: Radiation Oncology | Admitting: Radiation Oncology

## 2020-10-03 ENCOUNTER — Other Ambulatory Visit: Payer: Self-pay

## 2020-10-03 ENCOUNTER — Ambulatory Visit: Payer: Commercial Managed Care - PPO | Admitting: Radiation Oncology

## 2020-10-03 DIAGNOSIS — Z510 Encounter for antineoplastic radiation therapy: Secondary | ICD-10-CM | POA: Diagnosis not present

## 2020-10-06 ENCOUNTER — Other Ambulatory Visit: Payer: Self-pay

## 2020-10-06 ENCOUNTER — Ambulatory Visit
Admission: RE | Admit: 2020-10-06 | Discharge: 2020-10-06 | Disposition: A | Discharge status: Home / Self Care | Payer: Commercial Managed Care - PPO | Source: Ambulatory Visit | Attending: Radiation Oncology | Admitting: Radiation Oncology

## 2020-10-06 ENCOUNTER — Encounter (HOSPITAL_BASED_OUTPATIENT_CLINIC_OR_DEPARTMENT_OTHER): Payer: Self-pay | Admitting: General Surgery

## 2020-10-06 DIAGNOSIS — Z510 Encounter for antineoplastic radiation therapy: Secondary | ICD-10-CM | POA: Diagnosis not present

## 2020-10-06 MED ORDER — CHLORHEXIDINE GLUCONATE CLOTH 2 % EX PADS: 6.0000 | MEDICATED_PAD | Freq: Once | CUTANEOUS | Status: DC

## 2020-10-06 NOTE — Progress Notes (Signed)

## 2020-10-07 ENCOUNTER — Ambulatory Visit
Admission: RE | Admit: 2020-10-07 | Discharge: 2020-10-07 | Disposition: A | Discharge status: Home / Self Care | Payer: Commercial Managed Care - PPO | Source: Ambulatory Visit | Attending: Radiation Oncology | Admitting: Radiation Oncology

## 2020-10-07 DIAGNOSIS — Z510 Encounter for antineoplastic radiation therapy: Secondary | ICD-10-CM | POA: Diagnosis not present

## 2020-10-08 ENCOUNTER — Other Ambulatory Visit: Payer: Self-pay

## 2020-10-08 ENCOUNTER — Telehealth: Payer: Self-pay

## 2020-10-08 ENCOUNTER — Ambulatory Visit
Admission: RE | Admit: 2020-10-08 | Discharge: 2020-10-08 | Disposition: A | Discharge status: Home / Self Care | Payer: Commercial Managed Care - PPO | Source: Ambulatory Visit | Attending: Radiation Oncology | Admitting: Radiation Oncology

## 2020-10-08 DIAGNOSIS — Z510 Encounter for antineoplastic radiation therapy: Secondary | ICD-10-CM | POA: Diagnosis not present

## 2020-10-08 NOTE — Telephone Encounter (Signed)
RN placed call regarding receiving physician health screening form.   Voicemail left for call back.

## 2020-10-09 ENCOUNTER — Ambulatory Visit
Admission: RE | Admit: 2020-10-09 | Discharge: 2020-10-09 | Disposition: A | Discharge status: Home / Self Care | Payer: Commercial Managed Care - PPO | Source: Ambulatory Visit | Attending: Radiation Oncology | Admitting: Radiation Oncology

## 2020-10-09 ENCOUNTER — Telehealth: Payer: Self-pay | Admitting: *Deleted

## 2020-10-09 DIAGNOSIS — Z510 Encounter for antineoplastic radiation therapy: Secondary | ICD-10-CM | POA: Diagnosis not present

## 2020-10-09 NOTE — Telephone Encounter (Signed)
Completed form faxed to [facility]

## 2020-10-10 ENCOUNTER — Ambulatory Visit
Admission: RE | Admit: 2020-10-10 | Discharge: 2020-10-10 | Disposition: A | Discharge status: Home / Self Care | Payer: Commercial Managed Care - PPO | Source: Ambulatory Visit | Attending: Radiation Oncology | Admitting: Radiation Oncology

## 2020-10-10 DIAGNOSIS — Z510 Encounter for antineoplastic radiation therapy: Secondary | ICD-10-CM | POA: Diagnosis not present

## 2020-10-12 ENCOUNTER — Encounter: Payer: Self-pay | Admitting: Radiation Oncology

## 2020-10-12 ENCOUNTER — Other Ambulatory Visit: Payer: Self-pay

## 2020-10-12 ENCOUNTER — Encounter (HOSPITAL_BASED_OUTPATIENT_CLINIC_OR_DEPARTMENT_OTHER): Payer: Self-pay | Admitting: Obstetrics and Gynecology

## 2020-10-12 ENCOUNTER — Emergency Department (HOSPITAL_BASED_OUTPATIENT_CLINIC_OR_DEPARTMENT_OTHER)
Admission: EM | Admit: 2020-10-12 | Discharge: 2020-10-12 | Disposition: A | Discharge status: Home / Self Care | Payer: Commercial Managed Care - PPO | Attending: Emergency Medicine | Admitting: Emergency Medicine

## 2020-10-12 DIAGNOSIS — Z803 Family history of malignant neoplasm of breast: Secondary | ICD-10-CM | POA: Insufficient documentation

## 2020-10-12 DIAGNOSIS — Z170 Estrogen receptor positive status [ER+]: Secondary | ICD-10-CM | POA: Diagnosis not present

## 2020-10-12 DIAGNOSIS — C50511 Malignant neoplasm of lower-outer quadrant of right female breast: Secondary | ICD-10-CM | POA: Insufficient documentation

## 2020-10-12 DIAGNOSIS — L598 Other specified disorders of the skin and subcutaneous tissue related to radiation: Secondary | ICD-10-CM | POA: Diagnosis present

## 2020-10-12 DIAGNOSIS — Z85850 Personal history of malignant neoplasm of thyroid: Secondary | ICD-10-CM | POA: Diagnosis not present

## 2020-10-12 DIAGNOSIS — Z9013 Acquired absence of bilateral breasts and nipples: Secondary | ICD-10-CM | POA: Insufficient documentation

## 2020-10-12 DIAGNOSIS — Z7952 Long term (current) use of systemic steroids: Secondary | ICD-10-CM | POA: Insufficient documentation

## 2020-10-12 DIAGNOSIS — Z79890 Hormone replacement therapy (postmenopausal): Secondary | ICD-10-CM | POA: Insufficient documentation

## 2020-10-12 DIAGNOSIS — Z9089 Acquired absence of other organs: Secondary | ICD-10-CM | POA: Diagnosis not present

## 2020-10-12 DIAGNOSIS — T2121XA Burn of second degree of chest wall, initial encounter: Secondary | ICD-10-CM

## 2020-10-12 MED ORDER — OXYCODONE-ACETAMINOPHEN 5-325 MG PO TABS
1.0000 | ORAL_TABLET | ORAL | Status: AC | PRN
  Administered 2020-10-12 (×2): 1 via ORAL
  Filled 2020-10-12 (×2): qty 1

## 2020-10-12 MED ORDER — BACITRACIN ZINC 500 UNIT/GM EX OINT: TOPICAL_OINTMENT | Freq: Two times a day (BID) | CUTANEOUS | Status: DC

## 2020-10-12 MED ORDER — OXYCODONE HCL 5 MG PO TABS: 5.0000 mg | ORAL_TABLET | Freq: Four times a day (QID) | ORAL | 0 refills | Status: DC | PRN

## 2020-10-12 NOTE — Anesthesia Preprocedure Evaluation (Addendum)
Anesthesia Evaluation  Patient identified by MRN, date of birth, ID band Patient awake    Reviewed: Allergy & Precautions, NPO status , Patient's Chart, lab work & pertinent test results  History of Anesthesia Complications (+) PONV  Airway Mallampati: II  TM Distance: >3 FB Neck ROM: Full    Dental no notable dental hx. (+) Teeth Intact, Dental Advisory Given   Pulmonary neg pulmonary ROS,    Pulmonary exam normal breath sounds clear to auscultation       Cardiovascular Exercise Tolerance: Good negative cardio ROS Normal cardiovascular exam Rhythm:Regular Rate:Normal  03/17/20 echo 1. Left ventricular ejection fraction by 3D volume is 60 %. The left  ventricle has normal function. The left ventricle has no regional wall  motion abnormalities. Left ventricular diastolic parameters were normal.  The average left ventricular global  longitudinal strain is -19.7 %. The global longitudinal strain is normal.  2. Right ventricular systolic function is normal. The right ventricular  size is normal. Tricuspid regurgitation signal is inadequate for assessing  PA pressure.  3. The mitral valve is normal in structure. Trivial mitral valve  regurgitation. No evidence of mitral stenosis.  4. The aortic valve is grossly normal. There is mild calcification of the  aortic valve. Aortic valve regurgitation is trivial. No aortic stenosis is  present.  5. The inferior vena cava is normal in size with greater than 50%  respiratory variability, suggesting right atrial pressure of 3 mmHg.    Neuro/Psych    GI/Hepatic negative GI ROS, Neg liver ROS,   Endo/Other  Hypothyroidism   Renal/GU negative Renal ROS     Musculoskeletal negative musculoskeletal ROS (+)   Abdominal   Peds  Hematology   Anesthesia Other Findings R Breast CA hx s/p Rads  Reproductive/Obstetrics                            Anesthesia  Physical Anesthesia Plan  ASA: 2  Anesthesia Plan: General   Post-op Pain Management:    Induction: Intravenous  PONV Risk Score and Plan: 3 and TIVA, Ondansetron, Treatment may vary due to age or medical condition, Midazolam and Scopolamine patch - Pre-op  Airway Management Planned: LMA  Additional Equipment:   Intra-op Plan:   Post-operative Plan:   Informed Consent: I have reviewed the patients History and Physical, chart, labs and discussed the procedure including the risks, benefits and alternatives for the proposed anesthesia with the patient or authorized representative who has indicated his/her understanding and acceptance.     Dental advisory given  Plan Discussed with:   Anesthesia Plan Comments: (LMA TIVA)       Anesthesia Quick Evaluation

## 2020-10-12 NOTE — ED Provider Notes (Signed)
[facility] EMERGENCY DEPT Provider Note   CSN: [id number] Arrival date & time: 10/12/20  1239     History Chief Complaint  Patient presents with   Radiation Burn    [name] is a 65 y.o. female.  The history is provided by the patient.  Burn Burn location:  Torso Torso burn location:  R breast Burn quality:  Intact blister, ruptured blister, painful and red Time since incident: Radiation treatment 10/10/20. Progression:  Worsening Pain details:    Severity:  Moderate   Duration:  2 days   Timing:  Constant   Progression:  Worsening Mechanism of burn: radiation treatment. Relieved by: percocet given in the ED. Worsened by:  Tactile pressure Associated symptoms: no cough, no eye pain and no shortness of breath       Past Medical History:  Diagnosis Date   Breast cancer ([place]) [id number]   Complication of anesthesia    Dyslipidemia    Hypothyroidism    Pneumonia    PONV (postoperative nausea and vomiting)    Thyroid cancer [facility])     Patient Active Problem List   Diagnosis Date Noted   Port-A-Cath in place 06/18/2020   Cancer of right female breast ([place]) 02/28/2020   Malignant neoplasm of lower-outer quadrant of right breast of female, estrogen receptor positive ([place]) 08/08/2019    Past Surgical History:  Procedure Laterality Date   MASTECTOMY WITH AXILLARY LYMPH NODE DISSECTION Bilateral 02/28/2020   Procedure: BILATERAL MASTECTOMY WITH RIGHT SENTINEL LYMPH NODE MAPPING AND RIGHT TARGETED RADIOACTIVE SEED GUIDED  LYMPH NODE DISSECTION;  Surgeon: Jovita Kussmaul, MD;  Location: [facility];  Service: General;  Laterality: Bilateral;  PEC BLOCK, RNFA (SHARON HITCHCOCK)   PORTACATH PLACEMENT Left 03/21/2020   Procedure: PORT PLACEMENT WITH ULTRASOUND;  Surgeon: Jovita Kussmaul, MD;  Location: [facility];  Service: General;  Laterality: Left;   THYROIDECTOMY       OB History   No obstetric history on file.     Family History  Problem  Relation Age of Onset   Breast cancer Paternal Grandmother 54    Social History   Tobacco Use   Smoking status: Never   Smokeless tobacco: Never  Vaping Use   Vaping Use: Never used  Substance Use Topics   Alcohol use: Never   Drug use: Never    Home Medications Prior to Admission medications   Medication Sig Start Date End Date Taking? Authorizing Provider  calcium carbonate (OS-CAL) 1250 (500 Ca) MG chewable tablet Chew 1 tablet by mouth daily.   Yes [provider]  cholecalciferol (VITAMIN D3) 25 MCG (1000 UNIT) tablet Take 1,000 Units by mouth daily.   Yes [provider]  levothyroxine (SYNTHROID) 125 MCG tablet Take 1 tablet (125 mcg total) by mouth daily before breakfast. 08/28/19  Yes [name], MD  Multiple Vitamins-Minerals (MULTIVITAMIN WITH MINERALS) tablet Take 1 tablet by mouth daily.   Yes [provider]  rosuvastatin (CRESTOR) 10 MG tablet Take 1 tablet (10 mg total) by mouth daily. 08/28/19  Yes [name], MD  doxycycline (MONODOX) 100 MG capsule Take 100 mg by mouth 2 (two) times daily. 06/09/20   [provider]  lidocaine-prilocaine (EMLA) cream Apply to affected area once 03/06/20   [name], MD  LORazepam (ATIVAN) 0.5 MG tablet Take 1 tablet (0.5 mg total) by mouth every 8 (eight) hours. 06/11/20   [name], MD  prochlorperazine (COMPAZINE) 10 MG tablet Take 1 tablet (10 mg  total) by mouth every 6 (six) hours as needed (Nausea or vomiting). 03/06/20   [name], MD    Allergies    Patient has no known allergies.  Review of Systems   Review of Systems  Constitutional:  Negative for chills and fever.  HENT:  Negative for ear pain and sore throat.   Eyes:  Negative for pain and visual disturbance.  Respiratory:  Negative for cough and shortness of breath.   Cardiovascular:  Negative for chest pain and palpitations.  Gastrointestinal:  Negative for abdominal pain and vomiting.  Genitourinary:  Negative  for dysuria and hematuria.  Musculoskeletal:  Negative for arthralgias and back pain.  Skin:  Positive for wound. Negative for color change and rash.  Neurological:  Negative for seizures and syncope.  All other systems reviewed and are negative.  Physical Exam Updated Vital Signs BP 134/67   Pulse 72   Temp 97.8 F (36.6 C)   Resp 16   SpO2 100%   Physical Exam Vitals and nursing note reviewed.  HENT:     Head: Normocephalic and atraumatic.  Eyes:     General: No scleral icterus. Pulmonary:     Effort: Pulmonary effort is normal. No respiratory distress.  Musculoskeletal:     Cervical back: Normal range of motion.  Skin:    Comments: Erythematous right chest wall with multiple small, serous blisters some of which have ruptured.  Neurological:     General: No focal deficit present.     Mental Status: She is alert and oriented to person, place, and time.  Psychiatric:        Mood and Affect: Mood normal.     ED Results / Procedures / Treatments   Labs (all labs ordered are listed, but only abnormal results are displayed) Labs Reviewed - No data to display  EKG None  Radiology No results found.  Procedures Procedures   Medications Ordered in ED Medications  bacitracin ointment (has no administration in time range)  oxyCODONE-acetaminophen (PERCOCET/ROXICET) 5-325 MG per tablet 1 tablet (1 tablet Oral Given 10/12/20 1616)    ED Course  I have reviewed the triage vital signs and the nursing notes.  Pertinent labs & imaging results that were available during my care of the patient were reviewed by me and considered in my medical decision making (see chart for details).    MDM Rules/Calculators/A&P                           Quillian Quince has achieved good pain relief here in the ED with Percocet.  She is well-hydrated and able to maintain oral hydration.  Burn was dressed, and she was given a prescription for Percocet.  I sent a secure message to her  radiation oncologist.  She will also be seen first thing in the morning by the surgeon who is removing her port.  She also plans to see her radiation oncologist tomorrow. Final Clinical Impression(s) / ED Diagnoses Final diagnoses:  Partial thickness burn of chest wall, initial encounter  Malignant neoplasm of lower-outer quadrant of right breast of female, estrogen receptor positive ([place])    Rx / DC Orders ED Discharge Orders          Ordered    oxyCODONE (ROXICODONE) 5 MG immediate release tablet  Every 6 hours PRN        10/12/20 1714             Joya Gaskins,  Mancel Bale, MD 10/12/20 [phone]

## 2020-10-12 NOTE — ED Notes (Signed)
Wound care performed, bacitracin applied, telfa and kelix, wrapped with 6 in ace wrap to secure.  Pt tolerated well

## 2020-10-12 NOTE — Discharge Instructions (Addendum)
Keep the area covered until you are seen tomorrow.

## 2020-10-12 NOTE — ED Triage Notes (Signed)
Patient reports to the ER for radiation burn and blistering to the right side of her upper torso. Patient reports she is on her 25th round of radiation and has never had this happen before. Patient states pain is significant in the breast/axilla region

## 2020-10-13 ENCOUNTER — Telehealth: Payer: Self-pay | Admitting: Radiation Oncology

## 2020-10-13 ENCOUNTER — Ambulatory Visit (HOSPITAL_BASED_OUTPATIENT_CLINIC_OR_DEPARTMENT_OTHER): Payer: Commercial Managed Care - PPO | Admitting: Anesthesiology

## 2020-10-13 ENCOUNTER — Ambulatory Visit
Admission: RE | Admit: 2020-10-13 | Discharge: 2020-10-13 | Disposition: A | Discharge status: Home / Self Care | Payer: Commercial Managed Care - PPO | Source: Ambulatory Visit | Attending: Radiation Oncology | Admitting: Radiation Oncology

## 2020-10-13 ENCOUNTER — Other Ambulatory Visit: Payer: Self-pay

## 2020-10-13 ENCOUNTER — Ambulatory Visit (HOSPITAL_BASED_OUTPATIENT_CLINIC_OR_DEPARTMENT_OTHER)
Admission: RE | Admit: 2020-10-13 | Discharge: 2020-10-13 | Disposition: A | Discharge status: Home / Self Care | Payer: Commercial Managed Care - PPO | Attending: General Surgery | Admitting: General Surgery

## 2020-10-13 ENCOUNTER — Encounter: Payer: Self-pay | Admitting: Radiation Oncology

## 2020-10-13 ENCOUNTER — Encounter (HOSPITAL_BASED_OUTPATIENT_CLINIC_OR_DEPARTMENT_OTHER)
Admission: RE | Disposition: A | Discharge status: Home / Self Care | Payer: Self-pay | Source: Home / Self Care | Attending: General Surgery

## 2020-10-13 ENCOUNTER — Encounter (HOSPITAL_BASED_OUTPATIENT_CLINIC_OR_DEPARTMENT_OTHER): Payer: Self-pay | Admitting: General Surgery

## 2020-10-13 DIAGNOSIS — E039 Hypothyroidism, unspecified: Secondary | ICD-10-CM | POA: Diagnosis not present

## 2020-10-13 DIAGNOSIS — C50511 Malignant neoplasm of lower-outer quadrant of right female breast: Secondary | ICD-10-CM | POA: Diagnosis present

## 2020-10-13 DIAGNOSIS — Z170 Estrogen receptor positive status [ER+]: Secondary | ICD-10-CM | POA: Insufficient documentation

## 2020-10-13 DIAGNOSIS — Z510 Encounter for antineoplastic radiation therapy: Secondary | ICD-10-CM | POA: Diagnosis not present

## 2020-10-13 HISTORY — PX: PORT-A-CATH REMOVAL: SHX5289

## 2020-10-13 SURGERY — REMOVAL PORT-A-CATH
Anesthesia: General | Site: Chest | Laterality: Left

## 2020-10-13 MED ORDER — PROPOFOL 10 MG/ML IV BOLUS
INTRAVENOUS | Status: DC | PRN
Start: 2020-10-13 — End: 2020-10-13
  Administered 2020-10-13: 150 mg via INTRAVENOUS

## 2020-10-13 MED ORDER — KETOROLAC TROMETHAMINE 30 MG/ML IJ SOLN: 30.0000 mg | Freq: Once | INTRAMUSCULAR | Status: DC | PRN

## 2020-10-13 MED ORDER — SCOPOLAMINE 1 MG/3DAYS TD PT72
1.0000 | MEDICATED_PATCH | TRANSDERMAL | Status: DC
  Administered 2020-10-13: 1.5 mg via TRANSDERMAL

## 2020-10-13 MED ORDER — ONDANSETRON HCL 4 MG/2ML IJ SOLN
INTRAMUSCULAR | Status: DC | PRN
Start: 2020-10-13 — End: 2020-10-13
  Administered 2020-10-13: 4 mg via INTRAVENOUS

## 2020-10-13 MED ORDER — SCOPOLAMINE 1 MG/3DAYS TD PT72
MEDICATED_PATCH | TRANSDERMAL | Status: AC
  Filled 2020-10-13: qty 1

## 2020-10-13 MED ORDER — OXYCODONE HCL 5 MG PO TABS: 5.0000 mg | ORAL_TABLET | Freq: Once | ORAL | Status: DC | PRN

## 2020-10-13 MED ORDER — HYDROMORPHONE HCL 1 MG/ML IJ SOLN: 0.2500 mg | INTRAMUSCULAR | Status: DC | PRN

## 2020-10-13 MED ORDER — OXYCODONE HCL 5 MG PO TABS: 5.0000 mg | ORAL_TABLET | Freq: Four times a day (QID) | ORAL | 0 refills | Status: DC | PRN

## 2020-10-13 MED ORDER — PROPOFOL 500 MG/50ML IV EMUL
INTRAVENOUS | Status: DC | PRN
  Administered 2020-10-13: 120 ug/kg/min via INTRAVENOUS

## 2020-10-13 MED ORDER — LACTATED RINGERS IV SOLN: INTRAVENOUS | Status: DC

## 2020-10-13 MED ORDER — ONDANSETRON HCL 4 MG/2ML IJ SOLN: 4.0000 mg | Freq: Once | INTRAMUSCULAR | Status: DC | PRN

## 2020-10-13 MED ORDER — MIDAZOLAM HCL 5 MG/5ML IJ SOLN
INTRAMUSCULAR | Status: DC | PRN
  Administered 2020-10-13: 2 mg via INTRAVENOUS

## 2020-10-13 MED ORDER — BUPIVACAINE-EPINEPHRINE (PF) 0.25% -1:200000 IJ SOLN
INTRAMUSCULAR | Status: AC
  Filled 2020-10-13: qty 30

## 2020-10-13 MED ORDER — DEXAMETHASONE SODIUM PHOSPHATE 4 MG/ML IJ SOLN
INTRAMUSCULAR | Status: DC | PRN
  Administered 2020-10-13: 4 mg via INTRAVENOUS

## 2020-10-13 MED ORDER — BUPIVACAINE-EPINEPHRINE 0.25% -1:200000 IJ SOLN
INTRAMUSCULAR | Status: DC | PRN
  Administered 2020-10-13: 8 mL

## 2020-10-13 MED ORDER — FENTANYL CITRATE (PF) 100 MCG/2ML IJ SOLN
INTRAMUSCULAR | Status: DC | PRN
  Administered 2020-10-13: 100 ug via INTRAVENOUS

## 2020-10-13 MED ORDER — OXYCODONE HCL 5 MG/5ML PO SOLN
5.0000 mg | Freq: Once | ORAL | Status: DC | PRN
Start: 2020-10-13 — End: 2020-10-13

## 2020-10-13 MED ORDER — MIDAZOLAM HCL 2 MG/2ML IJ SOLN
INTRAMUSCULAR | Status: AC
  Filled 2020-10-13: qty 2

## 2020-10-13 MED ORDER — LIDOCAINE HCL (CARDIAC) PF 100 MG/5ML IV SOSY
PREFILLED_SYRINGE | INTRAVENOUS | Status: DC | PRN
  Administered 2020-10-13: 60 mg via INTRAVENOUS

## 2020-10-13 MED ORDER — FENTANYL CITRATE (PF) 100 MCG/2ML IJ SOLN
INTRAMUSCULAR | Status: AC
  Filled 2020-10-13: qty 2

## 2020-10-13 SURGICAL SUPPLY — 31 items
ADH SKN CLS APL DERMABOND .7 (GAUZE/BANDAGES/DRESSINGS) ×1
APL PRP STRL LF DISP 70% ISPRP (MISCELLANEOUS) ×1
BLADE SURG 15 STRL LF DISP TIS (BLADE) ×1 IMPLANT
BLADE SURG 15 STRL SS (BLADE) ×2
CHLORAPREP W/TINT 26 (MISCELLANEOUS) ×2 IMPLANT
COVER BACK TABLE 60X90IN (DRAPES) ×2 IMPLANT
COVER MAYO STAND STRL (DRAPES) ×2 IMPLANT
DECANTER SPIKE VIAL GLASS SM (MISCELLANEOUS) ×1 IMPLANT
DERMABOND ADVANCED (GAUZE/BANDAGES/DRESSINGS) ×1
DERMABOND ADVANCED .7 DNX12 (GAUZE/BANDAGES/DRESSINGS) ×1 IMPLANT
DRAPE LAPAROTOMY 100X72 PEDS (DRAPES) ×2 IMPLANT
DRAPE UTILITY XL STRL (DRAPES) ×1 IMPLANT
ELECT COATED BLADE 2.86 ST (ELECTRODE) IMPLANT
ELECT REM PT RETURN 9FT ADLT (ELECTROSURGICAL)
ELECTRODE REM PT RTRN 9FT ADLT (ELECTROSURGICAL) IMPLANT
GLOVE SURG ENC MOIS LTX SZ7.5 (GLOVE) ×2 IMPLANT
GLOVE SURG POLYISO LF SZ6.5 (GLOVE) ×1 IMPLANT
GLOVE SURG POLYISO LF SZ7 (GLOVE) ×1 IMPLANT
GLOVE SURG UNDER POLY LF SZ7 (GLOVE) ×2 IMPLANT
GOWN STRL REUS W/ TWL LRG LVL3 (GOWN DISPOSABLE) ×2 IMPLANT
GOWN STRL REUS W/TWL LRG LVL3 (GOWN DISPOSABLE) ×6
NDL HYPO 25X1 1.5 SAFETY (NEEDLE) ×1 IMPLANT
NEEDLE HYPO 25X1 1.5 SAFETY (NEEDLE) ×2 IMPLANT
PACK BASIN DAY SURGERY FS (CUSTOM PROCEDURE TRAY) ×2 IMPLANT
PENCIL SMOKE EVACUATOR (MISCELLANEOUS) IMPLANT
SLEEVE SCD COMPRESS KNEE MED (STOCKING) IMPLANT
SUT MON AB 4-0 PC3 18 (SUTURE) ×2 IMPLANT
SUT VIC AB 3-0 SH 27 (SUTURE) ×2
SUT VIC AB 3-0 SH 27X BRD (SUTURE) ×1 IMPLANT
SYR CONTROL 10ML LL (SYRINGE) ×2 IMPLANT
TOWEL GREEN STERILE FF (TOWEL DISPOSABLE) ×2 IMPLANT

## 2020-10-13 NOTE — Anesthesia Postprocedure Evaluation (Signed)
Anesthesia Post Note  Patient: [name]  Procedure(s) Performed: REMOVAL PORT-A-CATH (Left: Chest)     Patient location during evaluation: PACU Anesthesia Type: General Level of consciousness: awake and alert Pain management: pain level controlled Vital Signs Assessment: post-procedure vital signs reviewed and stable Respiratory status: spontaneous breathing, nonlabored ventilation, respiratory function stable and patient connected to nasal cannula oxygen Cardiovascular status: blood pressure returned to baseline and stable Postop Assessment: no apparent nausea or vomiting Anesthetic complications: no   No notable events documented.  Last Vitals:  Vitals:   10/13/20 0915 10/13/20 0925  BP: (!) 100/57 109/63  Pulse: 88 79  Resp: (!) 24 16  Temp:  36.5 C  SpO2: 95% 99%    Last Pain:  Vitals:   10/13/20 0925  TempSrc:   PainSc: 0-No pain                 Barnet Glasgow

## 2020-10-13 NOTE — Anesthesia Procedure Notes (Signed)
Procedure Name: LMA Insertion Date/Time: 10/13/2020 8:36 AM Performed by: Signe Colt, CRNA Pre-anesthesia Checklist: Patient identified, Emergency Drugs available, Suction available and Patient being monitored Patient Re-evaluated:Patient Re-evaluated prior to induction Oxygen Delivery Method: Circle System Utilized Preoxygenation: Pre-oxygenation with 100% oxygen Induction Type: IV induction Ventilation: Mask ventilation without difficulty LMA: LMA inserted LMA Size: 4.0 Number of attempts: 1 Airway Equipment and Method: bite block Placement Confirmation: positive ETCO2 Tube secured with: Tape Dental Injury: Teeth and Oropharynx as per pre-operative assessment

## 2020-10-13 NOTE — Telephone Encounter (Signed)
I called to check on the patient since she's had some significant blistering in the area of treatment to the right chest wall. She is scheduled this morning for PAC removal and I offered to check in with her today if she is coming for her treatment. I'm not sure if her skin changes are going to potentially change her plans for left chest wall PAC removal with Dr. [name].

## 2020-10-13 NOTE — Progress Notes (Signed)
The patient was seen yesterday in the ED at [facility] for progressive pain and rash in her right chest wall. She is currently undergoing postmastectomy radiotherapy to the right chest wall and regional nodes. She has received 24 of 33 fractions of radiation, totaling 43.2 Gy. She has been using radioplex as directed. She reports she is having terrible soreness in the area and was wrapped in curlex with vaseline gauze and an ace binder. She did also have her left chest PAC removed this morning in the OR with Dr. [name]. She presents to clinic for evaluation. She denies fevers or chills. She has had drainage on the telfa pads from the ED, and notes that some of the blisters have drained. Her pain is improved at this time after percocet yesterday evening, but today less uncomfortable than yesterday evening when she went to the ED.      Wt Readings from Last 3 Encounters:  10/13/20 147 lb 11.3 oz (67 kg)  08/26/20 147 lb (66.7 kg)  08/06/20 146 lb 8 oz (66.5 kg)   Temp Readings from Last 3 Encounters:  10/13/20 97.7 F (36.5 C)  10/12/20 98 F (36.7 C)  08/26/20 (!) 96.8 F (36 C) (Temporal)   BP Readings from Last 3 Encounters:  10/13/20 109/63  10/12/20 132/67  08/26/20 102/76   Pulse Readings from Last 3 Encounters:  10/13/20 79  10/12/20 65  08/26/20 87   In general this is a well appearing caucasian female in no acute distress. She's alert and oriented x4 and appropriate throughout the examination. Cardiopulmonary assessment is negative for acute distress and she exhibits normal effort. Her right chest wall reveals multiple blisters throughout the right chest wall from the axilla and inferior to this along what would have been the inframammary fold. There are multiple clusters of these blisters that have drained, and no visible moist desquamation, but dry desquamation in the right axillary fold. More medially she has dermatitis of the chest wall and this is noted also along the right  supraclavicular fossa. No blistering or desquamation is noted in these other sites. The patient was seen by Dr. Sondra Come as well and we were able to work her in for her treatment after he approved moving forward. Following her treatment, silvadene was placed by myself along the blistered sites, and dressed with telfa and covered with a tube style mesh by cutting a pair of mesh panties to keep the telfa in place.   Today's appointment:    We will plan to proceed with treatment this week. She will continue with silvadene in the blistered sites, and when she needs to be away from home dress the site with telfa and the mesh tube top. She was given silvadene to take as well. At home she will try to keep her skin dry and exposed to the air to keep it dry. If her skin does slough and she sees more of a moist desquamation develop, she has hydrogel pads to use. We discussed the placement of these as well and provided them to the patient. We will check in with her in the next few days as well as see her on Friday for her under treat appointment. She also has percocet from her ER visit and will use this as needed as well.      Carola Rhine, PAC

## 2020-10-13 NOTE — Interval H&P Note (Signed)
History and Physical Interval Note:  10/13/2020 8:16 AM  [name]  has presented today for surgery, with the diagnosis of RIGHT BREAST CANCER.  The various methods of treatment have been discussed with the patient and family. After consideration of risks, benefits and other options for treatment, the patient has consented to  Procedure(s): REMOVAL PORT-A-CATH (N/A) as a surgical intervention.  The patient's history has been reviewed, patient examined, no change in status, stable for surgery.  I have reviewed the patient's chart and labs.  Questions were answered to the patient's satisfaction.     [name]

## 2020-10-13 NOTE — Transfer of Care (Signed)
Immediate Anesthesia Transfer of Care Note  Patient: MARISE KOSMAN  Procedure(s) Performed: REMOVAL PORT-A-CATH (Left: Chest)  Patient Location: PACU  Anesthesia Type:General  Level of Consciousness: awake, alert , oriented and patient cooperative  Airway & Oxygen Therapy: Patient Spontanous Breathing and Patient connected to face mask oxygen  Post-op Assessment: Report given to RN and Post -op Vital signs reviewed and stable  Post vital signs: Reviewed and stable  Last Vitals:  Vitals Value Taken Time  BP    Temp    Pulse 74 10/13/20 0902  Resp 12 10/13/20 0902  SpO2 99 % 10/13/20 0902  Vitals shown include unvalidated device data.  Last Pain:  Vitals:   10/13/20 0725  TempSrc: Oral  PainSc: 5       Patients Stated Pain Goal: 3 ([id number])  Complications: No notable events documented.

## 2020-10-13 NOTE — Op Note (Signed)
10/13/2020  8:59 AM  PATIENT:  [name]  65 y.o. female  PRE-OPERATIVE DIAGNOSIS:  RIGHT BREAST CANCER  POST-OPERATIVE DIAGNOSIS:  RIGHT BREAST CANCER  PROCEDURE:  Procedure(s): REMOVAL PORT-A-CATH (Left)  SURGEON:  Surgeon(s) and Role:    * Jovita Kussmaul, MD - Primary  PHYSICIAN ASSISTANT:   ASSISTANTS: none   ANESTHESIA:   local and general  EBL:  minimal   BLOOD ADMINISTERED:none  DRAINS: none   LOCAL MEDICATIONS USED:  MARCAINE     SPECIMEN:  No Specimen  DISPOSITION OF SPECIMEN:  N/A  COUNTS:  YES  TOURNIQUET:  * No tourniquets in log *  DICTATION: .Dragon Dictation  After informed consent was obtained the patient was brought to the operating room and placed in the supine position on the operating table.  After adequate induction of general anesthesia the patient's left chest and neck area were prepped with ChloraPrep, allowed to dry, and draped in usual sterile manner.  An appropriate timeout was performed.  The area around the reservoir of the port was infiltrated with quarter percent Marcaine.  A small incision was made through her previous incision with a 15 blade knife.  The incision was carried through the subcutaneous tissue sharply with a 15 blade knife until the capsule surrounding the port was opened.  The 2 anchoring stitches were divided and removed.  With gentle traction the port was then removed from the patient without difficulty.  Pressure was held for several minutes until the area was completely hemostatic.  The tubing tract was closed with a figure-of-eight 3-0 Vicryl stitch.  The subcutaneous tissue was closed with interrupted 3-0 Vicryl stitches.  The skin was then closed with a running 4-0 Monocryl subcuticular stitch.  Dermabond dressings were applied.  The patient tolerated the procedure well.  At the end of the case all needle sponge and instrument counts were correct.  The patient was then awakened and taken to recovery in stable  condition.  PLAN OF CARE: Discharge to home after PACU  PATIENT DISPOSITION:  PACU - hemodynamically stable.   Delay start of Pharmacological VTE agent (>24hrs) due to surgical blood loss or risk of bleeding: not applicable

## 2020-10-13 NOTE — H&P (Signed)
[name]  Location: [facility] Patient #: [id number] DOB: 1955-12-03 Married / Language: English / Race: White Female   History of Present Illness  The patient is a 65 year old female who presents for a follow-up for Breast cancer. The patient is a 65 year old white female who is about 2 weeks status post right modified radical mastectomy for a T3 N2a right breast cancer that was ER/PR positive and HER-2/neu negative with a Ki-67 of 10%.  She also had a left prophylactic mastectomy.  She tolerated the surgery well.  Her drains are putting out minimal amounts of fluid.  She is scheduled for a Port-A-Cath next week so she can begin chemotherapy.    Review of Systems  General Not Present- Appetite Loss, Chills, Fatigue, Fever, Night Sweats, Weight Gain and Weight Loss. Skin Not Present- Change in Wart/Mole, Dryness, Hives, Jaundice, New Lesions, Non-Healing Wounds, Rash and Ulcer. HEENT Present- Wears glasses/contact lenses. Not Present- Earache, Hearing Loss, Hoarseness, Nose Bleed, Oral Ulcers, Ringing in the Ears, Seasonal Allergies, Sinus Pain, Sore Throat, Visual Disturbances and Yellow Eyes. Respiratory Not Present- Bloody sputum, Chronic Cough, Difficulty Breathing, Snoring and Wheezing. Breast Present- Breast Mass. Not Present- Breast Pain, Nipple Discharge and Skin Changes. Cardiovascular Not Present- Chest Pain, Difficulty Breathing Lying Down, Leg Cramps, Palpitations, Rapid Heart Rate, Shortness of Breath and Swelling of Extremities. Gastrointestinal Not Present- Abdominal Pain, Bloating, Bloody Stool, Change in Bowel Habits, Chronic diarrhea, Constipation, Difficulty Swallowing, Excessive gas, Gets full quickly at meals, Hemorrhoids, Indigestion, Nausea, Rectal Pain and Vomiting. Female Genitourinary Not Present- Frequency, Nocturia, Painful Urination, Pelvic Pain and Urgency. Musculoskeletal Not Present- Back Pain, Joint Pain, Joint Stiffness, Muscle Pain, Muscle  Weakness and Swelling of Extremities. Neurological Not Present- Decreased Memory, Fainting, Headaches, Numbness, Seizures, Tingling, Tremor, Trouble walking and Weakness. Psychiatric Not Present- Anxiety, Bipolar, Change in Sleep Pattern, Depression, Fearful and Frequent crying. Endocrine Not Present- Cold Intolerance, Excessive Hunger, Hair Changes, Heat Intolerance, Hot flashes and New Diabetes. Hematology Not Present- Blood Thinners, Easy Bruising, Excessive bleeding, Gland problems, HIV and Persistent Infections.   Physical Exam  General Mental Status - Alert. General Appearance - Consistent with stated age. Hydration - Well hydrated. Voice - Normal.  Head and Neck Head - normocephalic, atraumatic with no lesions or palpable masses. Trachea - midline. Thyroid Gland Characteristics - normal size and consistency.  Eye Eyeball - Bilateral - Extraocular movements intact. Sclera/Conjunctiva - Bilateral - No scleral icterus.  Chest and Lung Exam Chest and lung exam reveals  - quiet, even and easy respiratory effort with no use of accessory muscles and on auscultation, normal breath sounds, no adventitious sounds and normal vocal resonance. Inspection Chest Wall - Normal. Back - normal.  Breast Note:  Both mastectomy incisions are healing nicely with no sign of infection or seroma. The skin flaps are healthy. The drains were removed today without difficulty and she tolerated it well.   Cardiovascular Cardiovascular examination reveals  - normal heart sounds, regular rate and rhythm with no murmurs and normal pedal pulses bilaterally.  Abdomen Inspection Inspection of the abdomen reveals - No Hernias. Skin - Scar - no surgical scars. Palpation/Percussion Palpation and Percussion of the abdomen reveal - Soft, Non Tender, No Rebound tenderness, No Rigidity (guarding) and No hepatosplenomegaly. Auscultation Auscultation of the abdomen reveals - Bowel sounds  normal.  Neurologic Neurologic evaluation reveals  - alert and oriented x 3 with no impairment of recent or remote memory. Mental Status - Normal.  Musculoskeletal Normal Exam -  Left - Upper Extremity Strength Normal and Lower Extremity Strength Normal. Normal Exam - Right - Upper Extremity Strength Normal and Lower Extremity Strength Normal.  Lymphatic Head & Neck  General Head & Neck Lymphatics: Bilateral - Description - Normal. Axillary  General Axillary Region: Bilateral - Description - Normal. Tenderness - Non Tender. Femoral & Inguinal  Generalized Femoral & Inguinal Lymphatics: Bilateral - Description - Normal. Tenderness - Non Tender.    Assessment & Plan  MALIGNANT NEOPLASM OF LOWER-OUTER QUADRANT OF RIGHT BREAST OF FEMALE, ESTROGEN RECEPTOR POSITIVE (C50.511) Impression: The patient is about 2 weeks status post right mastectomy for a locally advanced breast cancer as well as a left prophylactic mastectomy. She tolerated the surgery well. Her drains were removed today without difficulty. She is scheduled for a port so she can receive chemotherapy next week. I have discussed with her and detailed the risks and benefits of the operation as well as some of the technical aspects and she understands and wishes to proceed Current Plans Referred to Physical Therapy, for evaluation and follow up (Physical Therapy). Routine. Here for port removal now

## 2020-10-13 NOTE — Discharge Instructions (Addendum)

## 2020-10-14 ENCOUNTER — Ambulatory Visit
Admission: RE | Admit: 2020-10-14 | Discharge: 2020-10-14 | Disposition: A | Discharge status: Home / Self Care | Payer: Commercial Managed Care - PPO | Source: Ambulatory Visit | Attending: Radiation Oncology | Admitting: Radiation Oncology

## 2020-10-14 ENCOUNTER — Encounter (HOSPITAL_BASED_OUTPATIENT_CLINIC_OR_DEPARTMENT_OTHER): Payer: Self-pay | Admitting: General Surgery

## 2020-10-14 DIAGNOSIS — Z510 Encounter for antineoplastic radiation therapy: Secondary | ICD-10-CM | POA: Diagnosis not present

## 2020-10-15 ENCOUNTER — Ambulatory Visit
Admission: RE | Admit: 2020-10-15 | Discharge: 2020-10-15 | Disposition: A | Discharge status: Home / Self Care | Payer: Commercial Managed Care - PPO | Source: Ambulatory Visit | Attending: Radiation Oncology | Admitting: Radiation Oncology

## 2020-10-15 ENCOUNTER — Other Ambulatory Visit: Payer: Self-pay

## 2020-10-15 DIAGNOSIS — Z510 Encounter for antineoplastic radiation therapy: Secondary | ICD-10-CM | POA: Diagnosis not present

## 2020-10-16 ENCOUNTER — Ambulatory Visit
Admission: RE | Admit: 2020-10-16 | Discharge: 2020-10-16 | Disposition: A | Discharge status: Home / Self Care | Payer: Commercial Managed Care - PPO | Source: Ambulatory Visit | Attending: Radiation Oncology | Admitting: Radiation Oncology

## 2020-10-16 ENCOUNTER — Encounter: Payer: Self-pay | Admitting: Radiation Oncology

## 2020-10-16 DIAGNOSIS — C773 Secondary and unspecified malignant neoplasm of axilla and upper limb lymph nodes: Secondary | ICD-10-CM | POA: Diagnosis not present

## 2020-10-16 DIAGNOSIS — Z510 Encounter for antineoplastic radiation therapy: Secondary | ICD-10-CM | POA: Diagnosis present

## 2020-10-16 DIAGNOSIS — C50511 Malignant neoplasm of lower-outer quadrant of right female breast: Secondary | ICD-10-CM | POA: Diagnosis present

## 2020-10-16 DIAGNOSIS — Z170 Estrogen receptor positive status [ER+]: Secondary | ICD-10-CM | POA: Diagnosis not present

## 2020-10-16 NOTE — Progress Notes (Signed)
The patient was seen today and will see Dr. Lisbeth Renshaw for her under treatment visit. She has had significant improvement in her pain and in the heat over her chest wall with silvadene and requests another tube. The blisters have improved greatly and there is a small area of moist desquamation along the inferior of the incision at the midline. She is planning to use hydrogel at this site.      Carola Rhine, PAC

## 2020-10-17 ENCOUNTER — Ambulatory Visit
Admission: RE | Admit: 2020-10-17 | Discharge: 2020-10-17 | Disposition: A | Discharge status: Home / Self Care | Payer: Commercial Managed Care - PPO | Source: Ambulatory Visit | Attending: Radiation Oncology | Admitting: Radiation Oncology

## 2020-10-17 ENCOUNTER — Other Ambulatory Visit: Payer: Self-pay

## 2020-10-17 DIAGNOSIS — Z510 Encounter for antineoplastic radiation therapy: Secondary | ICD-10-CM | POA: Diagnosis not present

## 2020-10-21 ENCOUNTER — Ambulatory Visit
Admission: RE | Admit: 2020-10-21 | Discharge: 2020-10-21 | Disposition: A | Discharge status: Home / Self Care | Payer: Commercial Managed Care - PPO | Source: Ambulatory Visit | Attending: Radiation Oncology | Admitting: Radiation Oncology

## 2020-10-21 ENCOUNTER — Other Ambulatory Visit: Payer: Self-pay

## 2020-10-21 DIAGNOSIS — Z510 Encounter for antineoplastic radiation therapy: Secondary | ICD-10-CM | POA: Diagnosis not present

## 2020-10-22 ENCOUNTER — Ambulatory Visit
Admission: RE | Admit: 2020-10-22 | Discharge: 2020-10-22 | Disposition: A | Discharge status: Home / Self Care | Payer: Commercial Managed Care - PPO | Source: Ambulatory Visit | Attending: Radiation Oncology | Admitting: Radiation Oncology

## 2020-10-22 DIAGNOSIS — Z510 Encounter for antineoplastic radiation therapy: Secondary | ICD-10-CM | POA: Diagnosis not present

## 2020-10-22 NOTE — Progress Notes (Signed)
Patient Care Team: Merri Brunette, MD as PCP - General (Internal Medicine) Pershing Proud, RN as Medical Oncologist Donnelly Angelica, RN as Oncology Nurse Navigator  DIAGNOSIS:    ICD-10-CM   1. Malignant neoplasm of lower-outer quadrant of right breast of female, estrogen receptor positive (HCC)  C50.511    Z17.0       SUMMARY OF ONCOLOGIC HISTORY: Oncology History  Malignant neoplasm of lower-outer quadrant of right breast of female, estrogen receptor positive (HCC)  08/01/2019 Initial Diagnosis   Palpable right breast mass for 1 month.  Mammogram revealed a 5 cm mass 8:30 position, 3 cm intramammary lymph node at 10 o'clock position and 2 enlarged right axillary lymph nodes.  Biopsy revealed grade 1 IDC ER 90%, PR 10%, Ki-67 10%, HER-2 negative.  Intramammary node and axillary lymph node both positive with similar prognostic profile   08/08/2019 -  Neo-Adjuvant Anti-estrogen oral therapy   Anastrozole while deciding treatment plan    02/28/2020 Surgery   Bilateral mastectomies Carolynne Edouard):  Right breast: IDC, 6.5cm, metastatic carcinoma in 6/7 right axillary lymph nodes, 1.5cm, 1.5cm, and 0.3cm, and one intramammary lymph node, 2.5cm Left breast: no evidence of malignancy in the breast or 1 left axillary lymph node.   03/26/2020 - 08/06/2020 Chemotherapy   Dose dense Adriamycin and Cytoxan followed by Taxol x12   09/09/2020 - 10/24/2020 Radiation Therapy   Adjuvant radiation     CHIEF COMPLIANT: Follow-up of right breast cancer  INTERVAL HISTORY: [name] is a 65 y.o. with above-mentioned history of right breast cancer treated with neoadjuvant antiestrogen therapy, bilateral mastectomies, and adjuvant chemotherapy, currently on radiation therapy. She presents to the clinic today for follow-up.  She is doing quite well from radiation.  She is complaining of radiation dermatitis but it is getting better.  ALLERGIES:  has No Known Allergies.  MEDICATIONS:  Current Outpatient  Medications  Medication Sig Dispense Refill   calcium carbonate (OS-CAL) 1250 (500 Ca) MG chewable tablet Chew 1 tablet by mouth daily.     cholecalciferol (VITAMIN D3) 25 MCG (1000 UNIT) tablet Take 1,000 Units by mouth daily.     levothyroxine (SYNTHROID) 125 MCG tablet Take 1 tablet (125 mcg total) by mouth daily before breakfast.     LORazepam (ATIVAN) 0.5 MG tablet Take 1 tablet (0.5 mg total) by mouth every 8 (eight) hours. 10 tablet 0   Multiple Vitamins-Minerals (MULTIVITAMIN WITH MINERALS) tablet Take 1 tablet by mouth daily.     oxyCODONE (ROXICODONE) 5 MG immediate release tablet Take 1 tablet (5 mg total) by mouth every 6 (six) hours as needed for up to 8 doses for severe pain. 8 tablet 0   oxyCODONE (ROXICODONE) 5 MG immediate release tablet Take 1 tablet (5 mg total) by mouth every 6 (six) hours as needed. 5 tablet 0   rosuvastatin (CRESTOR) 10 MG tablet Take 1 tablet (10 mg total) by mouth daily.     No current facility-administered medications for this visit.    PHYSICAL EXAMINATION: ECOG PERFORMANCE STATUS: 1 - Symptomatic but completely ambulatory  Vitals:   10/23/20 1503  BP: 117/60  Pulse: 77  Resp: 18  Temp: (!) 97.2 F (36.2 C)  SpO2: 99%   Filed Weights   10/23/20 1503  Weight: 149 lb 3.2 oz (67.7 kg)    LABORATORY DATA:  I have reviewed the data as listed CMP Latest Ref Rng & Units 08/06/2020 07/30/2020 07/23/2020  Glucose 70 - 99 mg/dL 656(M) 993(I) 879(J)  BUN  8 - 23 mg/dL $Remove'10 9 9  'cIjDcby$ Creatinine 0.44 - 1.00 mg/dL 0.62 0.66 0.66  Sodium 135 - 145 mmol/L 141 139 141  Potassium 3.5 - 5.1 mmol/L 4.1 4.2 3.9  Chloride 98 - 111 mmol/L 107 106 106  CO2 22 - 32 mmol/L $RemoveB'25 24 25  'BOHGuEBH$ Calcium 8.9 - 10.3 mg/dL 9.6 9.3 9.1  Total Protein 6.5 - 8.1 g/dL 6.4(L) 6.5 6.5  Total Bilirubin 0.3 - 1.2 mg/dL 0.5 0.5 0.5  Alkaline Phos 38 - 126 U/L 52 56 54  AST 15 - 41 U/L $Remo'19 16 17  'liEdJ$ ALT 0 - 44 U/L $Remo'17 16 14    'XHaQh$ Lab Results  Component Value Date   WBC 5.8 08/06/2020   HGB  10.1 (L) 08/06/2020   HCT 29.3 (L) 08/06/2020   MCV 88.3 08/06/2020   PLT 254 08/06/2020   NEUTROABS 4.1 08/06/2020    ASSESSMENT & PLAN:  Malignant neoplasm of lower-outer quadrant of right breast of female, estrogen receptor positive ([facility]) 08/01/2019:Palpable right breast mass for 1 month.  Mammogram revealed a 5 cm mass 8:30 position, 3 cm intramammary lymph node at 10 o'clock position and 2 enlarged right axillary lymph nodes.  Biopsy revealed grade 1 IDC ER 90%, PR 10%, Ki-67 10%, HER-2 negative.  Intramammary node and axillary lymph node both positive with similar prognostic profile   Breast MRI 08/15/2019: Biopsy-proven malignancy lower outer quadrant right breast, non-mass enhancement spanning 8.7 cm with probable invasion of underlying pectoral muscle.  Biopsy-proven metastatic lymph node in the right axillary tail.  Mild asymmetric right infraclavicular adenopathy.    Biopsy positive for invasive ductal carcinoma grade 1   CT CAP 08/24/2019: Right axillary and right retropectoral adenopathy compatible with nodal metastases.  Indistinct sclerotic 1.2 cm left acetabular lesion.  No other distant metastases  MammaPrint: Luminal type A, low risk, no benefit to chemotherapy Bone scan: Negative for metastatic disease   Treatment Summary:  1. Neoadjuvant antiestrogen therapy with anastrozole 1 mg daily started 08/08/2019 2. bilateral mastectomies 02/28/2020: Right mastectomy with ALND: IDC 6.5 cm, 7/8 lymph nodes positive; left mastectomy: Benign ER 90%, PR 10%, Ki-67 10%, HER2 negative 3. given the fact that the patient had 7 positive lymph nodes, she received dose dense Adriamycin and Cytoxan followed by Taxol completed 08/06/2020 4.  Adj radiation 09/09/2020-10/24/2020 5.  Followed by adjuvant antiestrogen therapy with letrozole and abemaciclib ----------------------------------------------------------------------------------------------------------------------------- Treatment plan: Antiestrogen  therapy with anastrozole 1 mg daily (will start 11/15/2020) Verzenio will start 11/24/2020 (will start at 50 p.o. twice daily for 2 weeks then increase to 100 p.o. twice daily for 2 weeks and then if she is tolerating it well can increase to 150 p.o. twice daily) Discussed risks and benefits of Verzenio and she is agreeable to proceed.   Monitoring closely for neuropathy.  Denies any current neuropathy symptoms.   Return to clinic 11/08/2020 to discuss tolerance to [facility] and get labs.      No orders of the defined types were placed in this encounter.  The patient has a good understanding of the overall plan. she agrees with it. she will call with any problems that may develop before the next visit here.  Total time spent: 30 mins including face to face time and time spent for planning, charting and coordination of care  Rulon Eisenmenger, MD, MPH 10/23/2020  I, Thana Ates, am acting as scribe for Dr. [name].  I have reviewed the above documentation for accuracy and completeness, and I agree with  the above.       

## 2020-10-23 ENCOUNTER — Inpatient Hospital Stay: Payer: Commercial Managed Care - PPO | Attending: Hematology and Oncology | Admitting: Hematology and Oncology

## 2020-10-23 ENCOUNTER — Encounter: Payer: Self-pay | Admitting: *Deleted

## 2020-10-23 ENCOUNTER — Encounter: Payer: Self-pay | Admitting: Hematology and Oncology

## 2020-10-23 ENCOUNTER — Other Ambulatory Visit (HOSPITAL_COMMUNITY): Payer: Self-pay

## 2020-10-23 ENCOUNTER — Telehealth: Payer: Self-pay

## 2020-10-23 ENCOUNTER — Other Ambulatory Visit: Payer: Self-pay

## 2020-10-23 ENCOUNTER — Ambulatory Visit
Admission: RE | Admit: 2020-10-23 | Discharge: 2020-10-23 | Disposition: A | Discharge status: Home / Self Care | Payer: Commercial Managed Care - PPO | Source: Ambulatory Visit | Attending: Radiation Oncology | Admitting: Radiation Oncology

## 2020-10-23 DIAGNOSIS — Z9013 Acquired absence of bilateral breasts and nipples: Secondary | ICD-10-CM | POA: Insufficient documentation

## 2020-10-23 DIAGNOSIS — C50511 Malignant neoplasm of lower-outer quadrant of right female breast: Secondary | ICD-10-CM | POA: Insufficient documentation

## 2020-10-23 DIAGNOSIS — Z170 Estrogen receptor positive status [ER+]: Secondary | ICD-10-CM

## 2020-10-23 DIAGNOSIS — Z923 Personal history of irradiation: Secondary | ICD-10-CM | POA: Insufficient documentation

## 2020-10-23 DIAGNOSIS — C773 Secondary and unspecified malignant neoplasm of axilla and upper limb lymph nodes: Secondary | ICD-10-CM | POA: Diagnosis not present

## 2020-10-23 DIAGNOSIS — Z9221 Personal history of antineoplastic chemotherapy: Secondary | ICD-10-CM | POA: Insufficient documentation

## 2020-10-23 DIAGNOSIS — Z510 Encounter for antineoplastic radiation therapy: Secondary | ICD-10-CM | POA: Diagnosis not present

## 2020-10-23 MED ORDER — ABEMACICLIB 50 MG PO TABS
50.0000 mg | ORAL_TABLET | Freq: Two times a day (BID) | ORAL | 0 refills | Status: DC
  Filled 2020-10-23: qty 84, 42d supply, fill #0

## 2020-10-23 MED ORDER — ANASTROZOLE 1 MG PO TABS
1.0000 mg | ORAL_TABLET | Freq: Every day | ORAL | 3 refills | Status: DC
Start: 2020-10-23 — End: 2021-11-24

## 2020-10-23 NOTE — Assessment & Plan Note (Signed)
08/01/2019:Palpable right breast mass for 1 month. Mammogram revealed a 5 cm mass 8:30 position, 3 cm intramammary lymph node at 10 o'clock position and 2 enlarged right axillary lymph nodes. Biopsy revealed grade 1 IDC ER 90%, PR 10%, Ki-67 10%, HER-2 negative. Intramammary node and axillary lymph node both positive with similar prognostic profile  Breast MRI 08/15/2019: Biopsy-proven malignancy lower outer quadrant right breast, non-mass enhancement spanning 8.7 cm with probable invasion of underlying pectoral muscle. Biopsy-proven metastatic lymph node in the right axillary tail. Mild asymmetric right infraclavicular adenopathy.Biopsy positive for invasive ductal carcinoma grade 1  CT CAP 08/24/2019: Right axillary and right retropectoral adenopathy compatible with nodal metastases. Indistinct sclerotic 1.2 cm left acetabular lesion. No other distant metastases MammaPrint: Luminal type A, low risk, no benefit to chemotherapy Bone scan: Negative for metastatic disease  TreatmentSummary: 1.Neoadjuvant antiestrogen therapy with anastrozole 1 mg daily started 08/08/2019 2.bilateral mastectomies1/13/2022: Right mastectomy with ALND: IDC 6.5 cm, 7/8 lymph nodes positive;left mastectomy: Benign ER 90%, PR 10%, Ki-67 10%, HER2 negative 3.given the fact that the patient had 7 positive lymph nodes, she received dose dense Adriamycin and Cytoxan followed by Taxol completed 08/06/2020 4.Adjradiation 09/09/2020-10/24/2020 5.Followed by adjuvant antiestrogen therapywith letrozole and abemaciclib ----------------------------------------------------------------------------------------------------------------------------- Treatment plan:Antiestrogen therapy with letrozole 2.5 mg daily. Return to clinic in 1 month to assess side effects and then consider starting Verzenio.  Monitoring closely for neuropathy.Denies any current neuropathy symptoms.    Return to clinic in 1 month for  follow-up

## 2020-10-23 NOTE — Telephone Encounter (Signed)
Oral Oncology Pharmacist Encounter  Received new prescription for Verzenio (abemaciclib) for the treatment of advanced stage ER+, HER2- right breast cancer, planned duration until disease progression or unacceptable toxicity.  Medication dose and frequency assessed. No interventions needed. See MD note for dose increase schedule.   Labs from 08/06/20 assessed, no interventions needed.  Current medication list in Epic reviewed, DDIs identified: none  Evaluated chart and no patient barriers to medication adherence noted.   Patient agreement for treatment documented in MD note on 10/23/20.  Prescription has been e-scribed to the [facility] for benefits analysis and approval.  Oral Oncology Clinic will continue to follow for insurance authorization, copayment issues, initial counseling and start date.  Drema Halon, PharmD Hematology/Oncology Clinical Pharmacist [facility] [phone]

## 2020-10-23 NOTE — Telephone Encounter (Signed)
Oral Oncology Patient Advocate Encounter   Received notification from [employer] that prior authorization for Verzenio is required.   PA submitted on CoverMyMeds Key [id number] Status is pending   Oral Oncology Clinic will continue to follow.   [place] Patient [place] Phone [phone] Fax [phone] 10/23/2020 3:41 PM

## 2020-10-24 ENCOUNTER — Encounter: Payer: Self-pay | Admitting: Hematology and Oncology

## 2020-10-24 ENCOUNTER — Ambulatory Visit
Admission: RE | Admit: 2020-10-24 | Discharge: 2020-10-24 | Disposition: A | Discharge status: Home / Self Care | Payer: Commercial Managed Care - PPO | Source: Ambulatory Visit | Attending: Radiation Oncology | Admitting: Radiation Oncology

## 2020-10-24 ENCOUNTER — Encounter: Payer: Self-pay | Admitting: Radiation Oncology

## 2020-10-24 ENCOUNTER — Other Ambulatory Visit (HOSPITAL_COMMUNITY): Payer: Self-pay

## 2020-10-24 DIAGNOSIS — Z510 Encounter for antineoplastic radiation therapy: Secondary | ICD-10-CM | POA: Diagnosis not present

## 2020-10-27 ENCOUNTER — Telehealth: Payer: Self-pay | Admitting: Radiation Oncology

## 2020-10-27 ENCOUNTER — Encounter: Payer: Self-pay | Admitting: Hematology and Oncology

## 2020-10-27 NOTE — Telephone Encounter (Signed)
The patient called back and feels as though her skin has significantly improved in the past few days. She is still having peeling but this is more dry. Her skin is tender but not painful like it had been two weeks ago. I encouraged her to use radioplex until the peeling stopped then transitioning to vitamin e containing lotions/oil. I will check in on her in a few weeks, but encouraged her to call me sooner if she had questions or concerns.

## 2020-10-27 NOTE — Progress Notes (Signed)
                                                                                                                                                             Patient Name: [name] MRN: [id number] DOB: February 20, 1955 Referring Physician: [name] (Profile Not Attached) Date of Service: 10/24/2020 [facility]                                                        End Of Treatment Note  Diagnoses: C50.511-Malignant neoplasm of lower-outer quadrant of right female breast  Cancer Staging: Stage IIA, cT2N1M0 grade 1, ER/PR positive invasive ductal carcinoma of the right breast.  Intent: Curative  Radiation Treatment Dates: 09/08/2020 through 10/24/2020 Site Technique Total Dose (Gy) Dose per Fx (Gy) Completed Fx Beam Energies  Chest Wall, Right: CW_Rt 3D 50.4/50.4 1.8 28/28 6X, 10X  Chest Wall, Right: CW_Rt_SCLV 3D 50.4/50.4 1.8 28/28 6X, 10X  Chest Wall, Right: CW_Rt_Bst Electron 10/10 2 5/5 6E   Narrative: The patient tolerated radiation therapy relatively well. She did develop a brisk skin reaction with blistering and moist desquamation that was treated with silvadene.   Plan: The patient will receive a call in about one month from the radiation oncology department. She will continue follow up with Dr. Lindi Adie as well.   ________________________________________________    Carola Rhine, [facility]

## 2020-10-27 NOTE — Telephone Encounter (Signed)
I called the patient to follow up with her regarding her skin changes. The images from Friday of last week were much improved compared to the week prior. I was unable to reach her but left her a voicemail to call me back.

## 2020-11-06 ENCOUNTER — Other Ambulatory Visit: Payer: Self-pay | Admitting: *Deleted

## 2020-11-06 ENCOUNTER — Other Ambulatory Visit (HOSPITAL_COMMUNITY): Payer: Self-pay

## 2020-11-06 DIAGNOSIS — C50511 Malignant neoplasm of lower-outer quadrant of right female breast: Secondary | ICD-10-CM

## 2020-11-06 NOTE — Progress Notes (Signed)
Patient Care Team: Deland Pretty, MD as PCP - General (Internal Medicine) Mauro Kaufmann, RN as Medical Oncologist Rockwell Germany, RN as Oncology Nurse Navigator  DIAGNOSIS:    ICD-10-CM   1. Malignant neoplasm of lower-outer quadrant of right breast of female, estrogen receptor positive ([place])  C50.511    Z17.0       SUMMARY OF ONCOLOGIC HISTORY: Oncology History  Malignant neoplasm of lower-outer quadrant of right breast of female, estrogen receptor positive ([place])  08/01/2019 Initial Diagnosis   Palpable right breast mass for 1 month.  Mammogram revealed a 5 cm mass 8:30 position, 3 cm intramammary lymph node at 10 o'clock position and 2 enlarged right axillary lymph nodes.  Biopsy revealed grade 1 IDC ER 90%, PR 10%, Ki-67 10%, HER-2 negative.  Intramammary node and axillary lymph node both positive with similar prognostic profile   08/08/2019 -  Neo-Adjuvant Anti-estrogen oral therapy   Anastrozole while deciding treatment plan    02/28/2020 Surgery   Bilateral mastectomies [name]):  Right breast: IDC, 6.5cm, metastatic carcinoma in 6/7 right axillary lymph nodes, 1.5cm, 1.5cm, and 0.3cm, and one intramammary lymph node, 2.5cm Left breast: no evidence of malignancy in the breast or 1 left axillary lymph node.   03/26/2020 - 08/06/2020 Chemotherapy   Dose dense Adriamycin and Cytoxan followed by Taxol x12   09/09/2020 - 10/24/2020 Radiation Therapy   Adjuvant radiation     CHIEF COMPLIANT: Follow-up of right breast cancer  INTERVAL HISTORY: [name] is a 65 y.o. with above-mentioned history of  right breast cancer treated with neoadjuvant antiestrogen therapy, bilateral mastectomies, and adjuvant chemotherapy, currently on radiation therapy. She presents to the clinic today for follow-up.  She has not received Verzenio at this time.  She is also yet to start anastrozole.  ALLERGIES:  has No Known Allergies.  MEDICATIONS:  Current Outpatient Medications  Medication Sig  Dispense Refill   abemaciclib (VERZENIO) 50 MG tablet Take 1 tablet (50 mg total) by mouth 2 (two) times daily. Take 1 tab twice daily X 14 days, then 2 tabs twice daly X 14 days 72 tablet 0   anastrozole (ARIMIDEX) 1 MG tablet Take 1 tablet (1 mg total) by mouth daily. 90 tablet 3   calcium carbonate (OS-CAL) 1250 (500 Ca) MG chewable tablet Chew 1 tablet by mouth daily.     cholecalciferol (VITAMIN D3) 25 MCG (1000 UNIT) tablet Take 1,000 Units by mouth daily.     levothyroxine (SYNTHROID) 125 MCG tablet Take 1 tablet (125 mcg total) by mouth daily before breakfast.     LORazepam (ATIVAN) 0.5 MG tablet Take 1 tablet (0.5 mg total) by mouth every 8 (eight) hours. 10 tablet 0   Multiple Vitamins-Minerals (MULTIVITAMIN WITH MINERALS) tablet Take 1 tablet by mouth daily.     oxyCODONE (ROXICODONE) 5 MG immediate release tablet Take 1 tablet (5 mg total) by mouth every 6 (six) hours as needed for up to 8 doses for severe pain. 8 tablet 0   rosuvastatin (CRESTOR) 10 MG tablet Take 1 tablet (10 mg total) by mouth daily.     No current facility-administered medications for this visit.    PHYSICAL EXAMINATION: ECOG PERFORMANCE STATUS: 1 - Symptomatic but completely ambulatory  There were no vitals filed for this visit. There were no vitals filed for this visit.    LABORATORY DATA:  I have reviewed the data as listed CMP Latest Ref Rng & Units 08/06/2020 07/30/2020 07/23/2020  Glucose 70 - 99 mg/dL  107(H) 103(H) 114(H)  BUN 8 - 23 mg/dL _0 Creatinine 0.44 - 1.00 mg/dL 0.62 0.66 0.66  Sodium 135 - 145 mmol/L 141 139 141  Potassium 3.5 - 5.1 mmol/L 4.1 4.2 3.9  Chloride 98 - 111 mmol/L 107 106 106  CO2 22 - 32 mmol/L _1 Calcium 8.9 - 10.3 mg/dL 9.6 9.3 9.1  Total Protein 6.5 - 8.1 g/dL 6.4(L) 6.5 6.5  Total Bilirubin 0.3 - 1.2 mg/dL 0.5 0.5 0.5  Alkaline Phos 38 - 126 U/L 52 56 54  AST 15 - 41 U/L _2 ALT 0 - 44 U/L _3 Lab Results  Component Value Date   WBC 5.8  08/06/2020   HGB 10.1 (L) 08/06/2020   HCT 29.3 (L) 08/06/2020   MCV 88.3 08/06/2020   PLT 254 08/06/2020   NEUTROABS 4.1 08/06/2020    ASSESSMENT & PLAN:  Malignant neoplasm of lower-outer quadrant of right breast of female, estrogen receptor positive ([place]) 08/01/2019:Palpable right breast mass for 1 month.  Mammogram revealed a 5 cm mass 8:30 position, 3 cm intramammary lymph node at 10 o'clock position and 2 enlarged right axillary lymph nodes.  Biopsy revealed grade 1 IDC ER 90%, PR 10%, Ki-67 10%, HER-2 negative.  Intramammary node and axillary lymph node both positive with similar prognostic profile   Breast MRI 08/15/2019: Biopsy-proven malignancy lower outer quadrant right breast, non-mass enhancement spanning 8.7 cm with probable invasion of underlying pectoral muscle.  Biopsy-proven metastatic lymph node in the right axillary tail.  Mild asymmetric right infraclavicular adenopathy.    Biopsy positive for invasive ductal carcinoma grade 1   CT CAP 08/24/2019: Right axillary and right retropectoral adenopathy compatible with nodal metastases.  Indistinct sclerotic 1.2 cm left acetabular lesion.  No other distant metastases  MammaPrint: Luminal type A, low risk, no benefit to chemotherapy Bone scan: Negative for metastatic disease   Treatment Summary:  1. Neoadjuvant antiestrogen therapy with anastrozole 1 mg daily started 08/08/2019 2. bilateral mastectomies 02/28/2020: Right mastectomy with ALND: IDC 6.5 cm, 7/8 lymph nodes positive; left mastectomy: Benign ER 90%, PR 10%, Ki-67 10%, HER2 negative 3. given the fact that the patient had 7 positive lymph nodes, she received dose dense Adriamycin and Cytoxan followed by Taxol completed 08/06/2020 4.  Adj radiation 09/09/2020-10/24/2020 5.  Followed by adjuvant antiestrogen therapy with letrozole and abemaciclib ----------------------------------------------------------------------------------------------------------------------------- Treatment  plan: Antiestrogen therapy with anastrozole 1 mg daily (started 11/15/2020), Verzenio to start 11/14/2020 (gradually escalating dose) Patient has not received Verzenio.  I sent a message to pharmacy trying to figure out where the prescription is in the process. In the interim I instructed her to start anastrozole.    Monitoring closely for toxicities Return to clinic in 2 weeks to follow-up with her pharmacy provider John    No orders of the defined types were placed in this encounter.  The patient has a good understanding of the overall plan. she agrees with it. she will call with any problems that may develop before the next visit here.  Total time spent: 30 mins including face to face time and time spent for planning, charting and coordination of care  Rulon Eisenmenger, MD, MPH 11/07/2020  I, Thana Ates, am acting as scribe for Dr. [name].  I have reviewed the above documentation for accuracy and completeness, and I agree with the above.

## 2020-11-07 ENCOUNTER — Other Ambulatory Visit (HOSPITAL_COMMUNITY): Payer: Self-pay

## 2020-11-07 ENCOUNTER — Inpatient Hospital Stay (HOSPITAL_BASED_OUTPATIENT_CLINIC_OR_DEPARTMENT_OTHER): Payer: Commercial Managed Care - PPO | Admitting: Hematology and Oncology

## 2020-11-07 ENCOUNTER — Inpatient Hospital Stay: Payer: Commercial Managed Care - PPO

## 2020-11-07 ENCOUNTER — Other Ambulatory Visit: Payer: Self-pay

## 2020-11-07 DIAGNOSIS — Z170 Estrogen receptor positive status [ER+]: Secondary | ICD-10-CM | POA: Diagnosis not present

## 2020-11-07 DIAGNOSIS — C50511 Malignant neoplasm of lower-outer quadrant of right female breast: Secondary | ICD-10-CM

## 2020-11-07 LAB — CBC WITH DIFFERENTIAL (CANCER CENTER ONLY)
Abs Immature Granulocytes: 0.02 10*3/uL (ref 0.00–0.07)
Basophils Absolute: 0.1 10*3/uL (ref 0.0–0.1)
Basophils Relative: 1 %
Eosinophils Absolute: 0.3 10*3/uL (ref 0.0–0.5)
Eosinophils Relative: 6 %
HCT: 35.7 % — ABNORMAL LOW (ref 36.0–46.0)
Hemoglobin: 11.9 g/dL — ABNORMAL LOW (ref 12.0–15.0)
Immature Granulocytes: 0 %
Lymphocytes Relative: 16 %
Lymphs Abs: 0.9 10*3/uL (ref 0.7–4.0)
MCH: 29.5 pg (ref 26.0–34.0)
MCHC: 33.3 g/dL (ref 30.0–36.0)
MCV: 88.6 fL (ref 80.0–100.0)
Monocytes Absolute: 0.7 10*3/uL (ref 0.1–1.0)
Monocytes Relative: 12 %
Neutro Abs: 3.6 10*3/uL (ref 1.7–7.7)
Neutrophils Relative %: 65 %
Platelet Count: 178 10*3/uL (ref 150–400)
RBC: 4.03 MIL/uL (ref 3.87–5.11)
RDW: 13.5 % (ref 11.5–15.5)
WBC Count: 5.6 10*3/uL (ref 4.0–10.5)
nRBC: 0 % (ref 0.0–0.2)

## 2020-11-07 LAB — CMP (CANCER CENTER ONLY)
ALT: 21 U/L (ref 0–44)
AST: 17 U/L (ref 15–41)
Albumin: 4.1 g/dL (ref 3.5–5.0)
Alkaline Phosphatase: 69 U/L (ref 38–126)
Anion gap: 10 (ref 5–15)
BUN: 14 mg/dL (ref 8–23)
CO2: 25 mmol/L (ref 22–32)
Calcium: 9.2 mg/dL (ref 8.9–10.3)
Chloride: 105 mmol/L (ref 98–111)
Creatinine: 0.72 mg/dL (ref 0.44–1.00)
GFR, Estimated: >60 mL/min (ref >60)
Glucose, Bld: 115 mg/dL — ABNORMAL HIGH (ref 70–99)
Potassium: 3.9 mmol/L (ref 3.5–5.1)
Sodium: 140 mmol/L (ref 135–145)
Total Bilirubin: 0.4 mg/dL (ref 0.3–1.2)
Total Protein: 6.7 g/dL (ref 6.5–8.1)

## 2020-11-07 NOTE — Telephone Encounter (Signed)
Oral Oncology Patient Advocate Encounter  Prior Authorization for [name] has been approved.    PA# [id number] Effective dates: 11/04/20 through 11/04/21  Oral Oncology Clinic will continue to follow.   [facility] [place] Phone [phone] Fax [phone] 11/07/2020 10:47 AM

## 2020-11-07 NOTE — Assessment & Plan Note (Signed)
08/01/2019:Palpable right breast mass for 1 month. Mammogram revealed a 5 cm mass 8:30 position, 3 cm intramammary lymph node at 10 o'clock position and 2 enlarged right axillary lymph nodes. Biopsy revealed grade 1 IDC ER 90%, PR 10%, Ki-67 10%, HER-2 negative. Intramammary node and axillary lymph node both positive with similar prognostic profile  Breast MRI 08/15/2019: Biopsy-proven malignancy lower outer quadrant right breast, non-mass enhancement spanning 8.7 cm with probable invasion of underlying pectoral muscle. Biopsy-proven metastatic lymph node in the right axillary tail. Mild asymmetric right infraclavicular adenopathy.Biopsy positive for invasive ductal carcinoma grade 1  CT CAP 08/24/2019: Right axillary and right retropectoral adenopathy compatible with nodal metastases. Indistinct sclerotic 1.2 cm left acetabular lesion. No other distant metastases MammaPrint: Luminal type A, low risk, no benefit to chemotherapy Bone scan: Negative for metastatic disease  TreatmentSummary: 1.Neoadjuvant antiestrogen therapy with anastrozole 1 mg daily started 08/08/2019 2.bilateral mastectomies1/13/2022: Right mastectomy with ALND: IDC 6.5 cm, 7/8 lymph nodes positive;left mastectomy: Benign ER 90%, PR 10%, Ki-67 10%, HER2 negative 3.given the fact that the patient had 7 positive lymph nodes, she received dose dense Adriamycin and Cytoxan followed by Taxol completed 08/06/2020 4.Adjradiation 09/09/2020-10/24/2020 5.Followed by adjuvant antiestrogen therapywith letrozole and abemaciclib ----------------------------------------------------------------------------------------------------------------------------- Treatment plan:Antiestrogen therapy with anastrozole 1 mg daily (started 11/15/2020), Verzenio started 11/24/2020 (gradually escalating dose)  Toxicities:  Monitoring closely for toxicities Return to clinic in 2 weeks to follow-up with her pharmacy provider Jenny Reichmann

## 2020-11-10 ENCOUNTER — Other Ambulatory Visit (HOSPITAL_COMMUNITY): Payer: Self-pay

## 2020-11-10 MED ORDER — ABEMACICLIB 50 MG PO TABS
50.0000 mg | ORAL_TABLET | Freq: Two times a day (BID) | ORAL | 0 refills | Status: DC
  Filled 2020-11-10: qty 84, 42d supply, fill #0
  Filled 2020-11-10: qty 84, 28d supply, fill #0

## 2020-11-10 NOTE — Telephone Encounter (Signed)
Oral Chemotherapy Pharmacist Encounter  I spoke with patient for overview of: Verzenio for the treatment of advanced stage, HER2-, hormone-receptor positive breast cancer, in combination with anastrozole, planned duration until disease progression or unacceptable toxicity.   Counseled patient on administration, dosing, side effects, monitoring, drug-food interactions, safe handling, storage, and disposal.  Patient will take Verzenio 57m tablets, 1 tablet by mouth twice daily without regard to food for 14 days then increase to two tablets (1066m for 14 days.   Patient knows to avoid grapefruit and grapefruit juice.  Verzenio start date: 11/12/20 Patient will pick up medication on 9/28. Informed patient to go ahead and start the medication that night.   Adverse effects include but are not limited to: diarrhea, fatigue, nausea, abdominal pain, hair loss, decreased blood counts, and increased liver function tests, and joint pains. Severe, life-threatening, and/or fatal interstitial lung disease (ILD) and/or pneumonitis may occur with CDK 4/6 inhibitors.  Patient has anti-emetic on hand and knows to take it if nausea develops.   Patient will obtain anti diarrheal and alert the office of 4 or more loose stools above baseline.  Reviewed with patient importance of keeping a medication schedule and plan for any missed doses. No barriers to medication adherence identified.  Medication reconciliation performed and medication/allergy list updated.  Insurance authorization for [employer] been obtained. Test claim at the pharmacy revealed copayment $0 for 1st fill of 28 days. Patient will pick up medication from the [facility] 11/12/20.   Patient informed the pharmacy will reach out 5-7 days prior to needing next fill of Verzenio to coordinate continued medication acquisition to prevent break in therapy.  All questions answered.  Mrs. Krouse voiced understanding and  appreciation.   Medication education handout placed in mail for patient. Patient knows to call the office with questions or concerns. Oral Chemotherapy Clinic phone number provided to patient.   KaDrema HalonPharmD Hematology/Oncology Clinical Pharmacist [phone]

## 2020-11-11 ENCOUNTER — Telehealth: Payer: Self-pay | Admitting: Adult Health

## 2020-11-11 ENCOUNTER — Other Ambulatory Visit (HOSPITAL_COMMUNITY): Payer: Self-pay

## 2020-11-11 NOTE — Telephone Encounter (Signed)
Called and educated patient on SCP visit.  Schedule message sent for 01/16/2021 at [place] am.  Wilber Bihari, NP

## 2020-11-12 ENCOUNTER — Other Ambulatory Visit (HOSPITAL_COMMUNITY): Payer: Self-pay

## 2020-11-19 ENCOUNTER — Other Ambulatory Visit: Payer: Self-pay | Admitting: *Deleted

## 2020-11-20 ENCOUNTER — Other Ambulatory Visit: Payer: Self-pay

## 2020-11-20 ENCOUNTER — Inpatient Hospital Stay: Payer: Commercial Managed Care - PPO | Attending: Hematology and Oncology

## 2020-11-20 ENCOUNTER — Other Ambulatory Visit: Payer: Self-pay | Admitting: Adult Health

## 2020-11-20 ENCOUNTER — Inpatient Hospital Stay: Payer: Commercial Managed Care - PPO | Admitting: Pharmacist

## 2020-11-20 VITALS — BP 126/56 | HR 74 | Temp 97.5°F | Resp 18 | Ht 67.0 in | Wt 149.0 lb

## 2020-11-20 DIAGNOSIS — C50511 Malignant neoplasm of lower-outer quadrant of right female breast: Secondary | ICD-10-CM

## 2020-11-20 DIAGNOSIS — Z170 Estrogen receptor positive status [ER+]: Secondary | ICD-10-CM

## 2020-11-20 LAB — CBC WITH DIFFERENTIAL (CANCER CENTER ONLY)
Abs Immature Granulocytes: 0.02 10*3/uL (ref 0.00–0.07)
Basophils Absolute: 0 10*3/uL (ref 0.0–0.1)
Basophils Relative: 1 %
Eosinophils Absolute: 0.2 10*3/uL (ref 0.0–0.5)
Eosinophils Relative: 4 %
HCT: 35.4 % — ABNORMAL LOW (ref 36.0–46.0)
Hemoglobin: 11.9 g/dL — ABNORMAL LOW (ref 12.0–15.0)
Immature Granulocytes: 0 %
Lymphocytes Relative: 19 %
Lymphs Abs: 0.9 10*3/uL (ref 0.7–4.0)
MCH: 29.6 pg (ref 26.0–34.0)
MCHC: 33.6 g/dL (ref 30.0–36.0)
MCV: 88.1 fL (ref 80.0–100.0)
Monocytes Absolute: 0.3 10*3/uL (ref 0.1–1.0)
Monocytes Relative: 7 %
Neutro Abs: 3.3 10*3/uL (ref 1.7–7.7)
Neutrophils Relative %: 69 %
Platelet Count: 194 10*3/uL (ref 150–400)
RBC: 4.02 MIL/uL (ref 3.87–5.11)
RDW: 13.5 % (ref 11.5–15.5)
WBC Count: 4.7 10*3/uL (ref 4.0–10.5)
nRBC: 0 % (ref 0.0–0.2)

## 2020-11-20 LAB — CMP (CANCER CENTER ONLY)
ALT: 16 U/L (ref 0–44)
AST: 15 U/L (ref 15–41)
Albumin: 4.2 g/dL (ref 3.5–5.0)
Alkaline Phosphatase: 70 U/L (ref 38–126)
Anion gap: 9 (ref 5–15)
BUN: 15 mg/dL (ref 8–23)
CO2: 26 mmol/L (ref 22–32)
Calcium: 9.5 mg/dL (ref 8.9–10.3)
Chloride: 106 mmol/L (ref 98–111)
Creatinine: 0.93 mg/dL (ref 0.44–1.00)
GFR, Estimated: >60 mL/min (ref >60)
Glucose, Bld: 96 mg/dL (ref 70–99)
Potassium: 4 mmol/L (ref 3.5–5.1)
Sodium: 141 mmol/L (ref 135–145)
Total Bilirubin: 0.4 mg/dL (ref 0.3–1.2)
Total Protein: 7 g/dL (ref 6.5–8.1)

## 2020-11-20 NOTE — Progress Notes (Signed)
[place]       Telephone: [phone]?Fax: [phone]   Oncology Clinical Pharmacist Practitioner Initial Assessment  [name] is a 65 y.o. female with a diagnosis of breast cancer.  Indication/Regimen Abemaciclib (Verzenio) is being used appropriately for treatment of breast cancer by Dr. Lindi Hunt.    Wt Readings from Last 1 Encounters:  11/20/20 149 lb (67.6 kg)    Estimated body surface area is 1.79 meters squared as calculated from the following:   Height as of this encounter: 5\' 7"  (1.702 m).   Weight as of this encounter: 149 lb (67.6 kg).  The dosing regimen is 50 mg by mouth every 12 hours on days 1 to 28 of a 28-day cycle. It is planned to continue until  two years of therapy per the monarchE trial data . Abemaciclib was started on 11/13/20 and the plan is to increase from 50 mg every 12 hours after 14 days to 100 mg every 12 hours.  If tolerated, patient will then move to 150 mg every 12 hours. She is tolerating the abemaciclib with no side effects currently. She does have ondansetron, prochlorperazine, and loperamide at home if needed. Adverse effects of abemaciclib include but are not limited to: diarrhea, fatigue, nausea, abdominal pain, hair loss, decreased blood counts, pneumonitis, blood clots, and increased liver function tests. [name] is also on anastrozole and has not had her DEXA scan yet.  She has calcium and vitamin d on hand that she takes daily. This will be ordered today.  She takes her abemaciclib with food around 630a and 630p. She knows what to do if she missed a dose and says currently she has no issues with compliance. She receives her abemaciclib from the [facility].   Dose Modifications Starting at 50 mg every 12 hours for 14 days, then increase to 100 mg every 12 hours for 14 days, then full dose (150 mg) every 12 hours as tolerated.   Allergies No Known Allergies  Laboratory Data CBC EXTENDED Latest Ref Rng & Units  11/20/2020 11/07/2020 08/06/2020  WBC 4.0 - 10.5 K/uL 4.7 5.6 5.8  RBC 3.87 - 5.11 MIL/uL 4.02 4.03 3.32(L)  HGB 12.0 - 15.0 g/dL 11.9(L) 11.9(L) 10.1(L)  HCT 36.0 - 46.0 % 35.4(L) 35.7(L) 29.3(L)  PLT 150 - 400 K/uL 194 178 254  NEUTROABS 1.7 - 7.7 K/uL 3.3 3.6 4.1  LYMPHSABS 0.7 - 4.0 K/uL 0.9 0.9 0.8    CMP Latest Ref Rng & Units 11/20/2020 11/07/2020 08/06/2020  Glucose 70 - 99 mg/dL 96 115(H) 107(H)  BUN 8 - 23 mg/dL 15 14 10   Creatinine 0.44 - 1.00 mg/dL 0.93 0.72 0.62  Sodium 135 - 145 mmol/L 141 140 141  Potassium 3.5 - 5.1 mmol/L 4.0 3.9 4.1  Chloride 98 - 111 mmol/L 106 105 107  CO2 22 - 32 mmol/L 26 25 25   Calcium 8.9 - 10.3 mg/dL 9.5 9.2 9.6  Total Protein 6.5 - 8.1 g/dL 7.0 6.7 6.4(L)  Total Bilirubin 0.3 - 1.2 mg/dL 0.4 0.4 0.5  Alkaline Phos 38 - 126 U/L 70 69 52  AST 15 - 41 U/L 15 17 19   ALT 0 - 44 U/L 16 21 17     Contraindications Contraindications were reviewed? Yes Contraindications to therapy were identified? No   Safety Precautions The following safety precautions for the use of abemaciclib were reviewed:  Diarrhea: we reviewed that diarrhea is common with abemaciclib and confirmed that she does have loperamide (Immodium)  at home.  We reviewed how to take this medication PRN and gave her information on abemaciclib Neutropenia: we discussed the importance of having a thermometer and what the Centers for Disease Control and Prevention (CDC) considers a fever which is 100.72F (38C) or higher.  Gave patient 24/7 triage line to call if any fevers or symptoms ILD/Pneumonitis: we reviewed potential symptoms including cough, shortness, and fatigue. Hepatotoxicity: reviewed to contact clinic for RUQ pain that will not subside, yellowing of eyes/skin Venous thromboembolism (VTE): reviewed signs of deep vein thrombosis (DVT) such as leg swelling, redness, pain, or tenderness and signs of pulmonary embolism (PE) such as shortness of breath, rapid or irregular heartbeat, cough,  chest pain, or lightheadedness Reviewed to take the medication every 12 hours (with food sometimes can be easier on the stomach) and to take it at the same time every day. Discussed proper storage and handling of abemaciclib Nausea / vomiting: [name] has anti-nausea medications at home if needed  Medication Reconciliation Current Outpatient Medications  Medication Sig Dispense Refill   abemaciclib (VERZENIO) 50 MG tablet Take 1 tablet  (50 mg) twice daily X 14 days, then 2 tabs (100 mg) twice daily X 14 days 84 tablet 0   anastrozole (ARIMIDEX) 1 MG tablet Take 1 tablet (1 mg total) by mouth daily. 90 tablet 3   calcium carbonate (OS-CAL) 1250 (500 Ca) MG chewable tablet Chew 1 tablet by mouth daily.     cholecalciferol (VITAMIN D3) 25 MCG (1000 UNIT) tablet Take 1,000 Units by mouth daily.     levothyroxine (SYNTHROID) 125 MCG tablet Take 1 tablet (125 mcg total) by mouth daily before breakfast. (Patient taking differently: Take 125 mcg by mouth daily before breakfast. Taking at night per patient/pcp)     rosuvastatin (CRESTOR) 10 MG tablet Take 1 tablet (10 mg total) by mouth daily.     loperamide (IMODIUM) 2 MG capsule Take by mouth as needed for diarrhea or loose stools. (Patient not taking: Reported on 11/20/2020)     Multiple Vitamins-Minerals (MULTIVITAMIN WITH MINERALS) tablet Take 1 tablet by mouth daily. (Patient not taking: Reported on 11/20/2020)     ondansetron (ZOFRAN) 8 MG tablet Take 8 mg by mouth every 8 (eight) hours as needed for nausea or vomiting. (Patient not taking: Reported on 11/20/2020)     prochlorperazine (COMPAZINE) 10 MG tablet Take 10 mg by mouth every 6 (six) hours as needed for nausea or vomiting. (Patient not taking: Reported on 11/20/2020)     No current facility-administered medications for this visit.    Medication reconciliation is based on the patient's most recent medication list in the electronic medical record (EMR) including herbal products and OTC  medications.   The patient's medication list was reviewed today with the patient? Yes   Drug-drug interactions (DDIs) DDIs were evaluated? Yes DDIs identified? No   Drug-Food Interactions Drug-food interactions were evaluated? Yes Drug-food interactions identified?  Patient will continue to avoid grapefruit juice  Follow-up Plan  Labs every 2 weeks for 2 months, then monthly for 2 months, then as clinically indicated. Next lab appointment 12/04/20 Schedule labs appointments for 12/04/20, 12/18/20, 01/01/21, and 01/16/21 See me in pharmacy clinic in 4 weeks (12/18/20) See Mendel Ryder with labs on 01/16/21 (visit already entered but needs lab appointment) DEXA scan ordered  Quillian Quince participated in the discussion, expressed understanding, and voiced agreement with the above plan. All questions were answered to her satisfaction. The patient was advised to contact the clinic at [phone]  [phone] with any questions or concerns prior to her return visit.   I spent 50 minutes assessing the patient.  Raina Mina, RPH-CPP, 11/20/2020 10:56 AM

## 2020-11-24 ENCOUNTER — Ambulatory Visit
Admission: RE | Admit: 2020-11-24 | Discharge: 2020-11-24 | Disposition: A | Discharge status: Home / Self Care | Payer: Commercial Managed Care - PPO | Source: Ambulatory Visit | Attending: Radiation Oncology | Admitting: Radiation Oncology

## 2020-11-24 DIAGNOSIS — Z170 Estrogen receptor positive status [ER+]: Secondary | ICD-10-CM | POA: Insufficient documentation

## 2020-11-24 DIAGNOSIS — C50511 Malignant neoplasm of lower-outer quadrant of right female breast: Secondary | ICD-10-CM | POA: Insufficient documentation

## 2020-11-24 NOTE — Progress Notes (Signed)
  Radiation Oncology         [phone] ________________________________  Name: [name] MRN: [id number]  Date of Service: 11/24/2020  DOB: 07-15-1955  Post Treatment Telephone Note  Diagnosis:   Stage IIA, cT2N1M0 grade 1, ER/PR positive invasive ductal carcinoma of the right breast.  Interval Since Last Radiation:  5 weeks   09/08/2020 through 10/24/2020 Site Technique Total Dose (Gy) Dose per Fx (Gy) Completed Fx Beam Energies  Chest Wall, Right: CW_Rt 3D 50.4/50.4 1.8 28/28 6X, 10X  Chest Wall, Right: CW_Rt_SCLV 3D 50.4/50.4 1.8 28/28 6X, 10X  Chest Wall, Right: CW_Rt_Bst Electron 10/10 2 5/5 6E    Narrative:  The patient was contacted today for routine follow-up. During treatment she did very well with radiotherapy but developed brisk reaction and moist desquamation at the conclusion of therapy.   Impression/Plan: 1. Stage IIA, cT2N1M0 grade 1, ER/PR positive invasive ductal carcinoma of the right breast.I was unable to reach the patient but left a voicemail and on the message, I  encouraged her to call back if she has questions regarding her skin. We would be happy to continue to follow her as needed, but she will also continue to follow up with Dr. Lindi Adie in medical oncology.        Carola Rhine, PAC

## 2020-12-01 ENCOUNTER — Other Ambulatory Visit (HOSPITAL_COMMUNITY): Payer: Self-pay

## 2020-12-01 ENCOUNTER — Other Ambulatory Visit: Payer: Self-pay | Admitting: Hematology and Oncology

## 2020-12-01 DIAGNOSIS — C50511 Malignant neoplasm of lower-outer quadrant of right female breast: Secondary | ICD-10-CM

## 2020-12-01 NOTE — Telephone Encounter (Signed)
John, are you able to send in a new scrip for her Verzenio.  Based on your note, it looks like you guys are titrating the dose up.  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 Thanks!

## 2020-12-01 NOTE — Telephone Encounter (Signed)
  [place]       Telephone: [phone]?Fax: [phone]   Oncology Clinical Pharmacist Practitioner Note  [name] is a 65 y.o. female with a diagnosis of breast cancer currently on adjuvant abemaciclib under the care of Dr. [name].  [name] also takes daily anastrozole.  Interval History Clinical pharmacy received a message on 12/01/20 about a refill request for abemaciclib that had been received. Contacted [name] to review tolerability of abemaciclib and next steps regarding the dose titration.  She states she started the 100 mg every 12 hour dose on 11/26/20 and plans to take this dose for 14 days if tolerated per Dr. Geralyn Flash instructions. She is doing really well with the current dose and having no side effects.  Initially she was started on 50 mg every 12 hours x 14 days which was started on 11/13/20. Dr. Geralyn Flash plan is for [name] to increase to 150 mg every 12 hours once her current prescription runs out which is estimated to occur on 12/10/20.  She has labs due on 12/04/20 and if she meets treatment parameters, a new prescription for the 150 mg every 12 hours dose will be sent to the [facility] with instructions to start once her current prescription runs out  The existing refill request has been cancelled.   Follow-Up Plan Labs on 12/04/20; if treatment parameters met, new prescription to be sent for abemaciclib 150 mg every 12 hours See clinical pharmacy with labs, toxicity check on 12/18/20  [name] participated in the discussion, expressed understanding, and voiced agreement with the above plan. All questions were answered to her satisfaction. The patient was advised to contact the clinic at [phone] with any questions or concerns prior to her return visit.  Clinical pharmacy will continue to support [name] and Dr. Lindi Adie as needed.  Raina Mina, RPH-CPP,  12/01/2020  3:35 PM

## 2020-12-02 ENCOUNTER — Other Ambulatory Visit (HOSPITAL_COMMUNITY): Payer: Self-pay

## 2020-12-02 ENCOUNTER — Other Ambulatory Visit: Payer: Self-pay | Admitting: Hematology and Oncology

## 2020-12-02 DIAGNOSIS — Z170 Estrogen receptor positive status [ER+]: Secondary | ICD-10-CM

## 2020-12-02 DIAGNOSIS — C50511 Malignant neoplasm of lower-outer quadrant of right female breast: Secondary | ICD-10-CM

## 2020-12-02 NOTE — Telephone Encounter (Signed)
Hildred Priest, I can not remember what patient I forward you a refill request for Verzenio on yesterday.  Mrs. Haring is due for a refill as well with titration if you can please fill.  She had not had a f/u with Dr. Lindi Adie yet, it looks like the last OV was with you.  Thanks!

## 2020-12-02 NOTE — Telephone Encounter (Signed)
[name], I called Ms. Brothers yesterday and put in a telephone encounter.  We are waiting on labs from 12/04/20 and then will send prescription at that time if appropriate.  This was discussed with [name]

## 2020-12-04 ENCOUNTER — Other Ambulatory Visit (HOSPITAL_COMMUNITY): Payer: Self-pay

## 2020-12-04 ENCOUNTER — Inpatient Hospital Stay: Payer: Commercial Managed Care - PPO

## 2020-12-04 ENCOUNTER — Other Ambulatory Visit: Payer: Self-pay

## 2020-12-04 ENCOUNTER — Telehealth: Payer: Self-pay | Admitting: Pharmacist

## 2020-12-04 DIAGNOSIS — C50511 Malignant neoplasm of lower-outer quadrant of right female breast: Secondary | ICD-10-CM | POA: Diagnosis not present

## 2020-12-04 DIAGNOSIS — Z170 Estrogen receptor positive status [ER+]: Secondary | ICD-10-CM

## 2020-12-04 LAB — CMP (CANCER CENTER ONLY)
ALT: 19 U/L (ref 0–44)
AST: 18 U/L (ref 15–41)
Albumin: 4.4 g/dL (ref 3.5–5.0)
Alkaline Phosphatase: 65 U/L (ref 38–126)
Anion gap: 6 (ref 5–15)
BUN: 13 mg/dL (ref 8–23)
CO2: 29 mmol/L (ref 22–32)
Calcium: 9.5 mg/dL (ref 8.9–10.3)
Chloride: 102 mmol/L (ref 98–111)
Creatinine: 0.83 mg/dL (ref 0.44–1.00)
GFR, Estimated: >60 mL/min (ref >60)
Glucose, Bld: 110 mg/dL — ABNORMAL HIGH (ref 70–99)
Potassium: 3.8 mmol/L (ref 3.5–5.1)
Sodium: 137 mmol/L (ref 135–145)
Total Bilirubin: 0.6 mg/dL (ref 0.3–1.2)
Total Protein: 6.8 g/dL (ref 6.5–8.1)

## 2020-12-04 LAB — CBC WITH DIFFERENTIAL (CANCER CENTER ONLY)
Abs Immature Granulocytes: 0.01 10*3/uL (ref 0.00–0.07)
Basophils Absolute: 0 10*3/uL (ref 0.0–0.1)
Basophils Relative: 1 %
Eosinophils Absolute: 0.1 10*3/uL (ref 0.0–0.5)
Eosinophils Relative: 2 %
HCT: 33.4 % — ABNORMAL LOW (ref 36.0–46.0)
Hemoglobin: 11.6 g/dL — ABNORMAL LOW (ref 12.0–15.0)
Immature Granulocytes: 0 %
Lymphocytes Relative: 21 %
Lymphs Abs: 1.1 10*3/uL (ref 0.7–4.0)
MCH: 29.9 pg (ref 26.0–34.0)
MCHC: 34.7 g/dL (ref 30.0–36.0)
MCV: 86.1 fL (ref 80.0–100.0)
Monocytes Absolute: 0.3 10*3/uL (ref 0.1–1.0)
Monocytes Relative: 7 %
Neutro Abs: 3.5 10*3/uL (ref 1.7–7.7)
Neutrophils Relative %: 69 %
Platelet Count: 142 10*3/uL — ABNORMAL LOW (ref 150–400)
RBC: 3.88 MIL/uL (ref 3.87–5.11)
RDW: 13.8 % (ref 11.5–15.5)
WBC Count: 5 10*3/uL (ref 4.0–10.5)
nRBC: 0 % (ref 0.0–0.2)

## 2020-12-04 MED ORDER — ABEMACICLIB 150 MG PO TABS
150.0000 mg | ORAL_TABLET | Freq: Two times a day (BID) | ORAL | 0 refills | Status: DC
  Filled 2020-12-04: qty 56, 28d supply, fill #0

## 2020-12-04 NOTE — Telephone Encounter (Signed)
[place]       Telephone: [phone]?Fax: [phone]   Oncology Clinical Pharmacist Practitioner Progress Note   [name] is a 65 y.o. female with a diagnosis of breast cancer currently on adjuvant abemaciclib under the care of Dr. [name]. She also receives daily anastrozole.  Interval History Contacted [name] via telephone today.  Reviewed labs with patient and confirmed tolerating abemaciclib well. As  discussed prior with her and Dr. Lindi Adie, will send new prescription for abemaciclib 150 mg tablets. Take 1 tablet every 12 hours with or without food.  To start once existing 50 mg tablets are exhausted  Follow-Up Plan New prescription for abemaciclib 150 mg PO every 12 hours being sent to [facility]. To start once 50 mg tablets are exhausted. Sometime around 12/11/20 Follow up with labs, visit with clinical pharmacy, and toxicity check on 12/18/20  [name] participated in the discussion, expressed understanding, and voiced agreement with the above plan. All questions were answered to her satisfaction. The patient was advised to contact the clinic at [phone] with any questions or concerns prior to her return visit.  Clinical pharmacy will continue to support [name] and Dr. Lindi Adie as needed.  Raina Mina, RPH-CPP,  12/04/2020  2:53 PM

## 2020-12-05 ENCOUNTER — Other Ambulatory Visit (HOSPITAL_COMMUNITY): Payer: Self-pay

## 2020-12-18 ENCOUNTER — Other Ambulatory Visit: Payer: Self-pay

## 2020-12-18 ENCOUNTER — Inpatient Hospital Stay: Payer: Commercial Managed Care - PPO | Admitting: Pharmacist

## 2020-12-18 ENCOUNTER — Inpatient Hospital Stay: Payer: Commercial Managed Care - PPO | Attending: Hematology and Oncology

## 2020-12-18 VITALS — BP 123/65 | HR 80 | Temp 97.2°F | Resp 18 | Ht 67.0 in | Wt 146.1 lb

## 2020-12-18 DIAGNOSIS — Z170 Estrogen receptor positive status [ER+]: Secondary | ICD-10-CM | POA: Insufficient documentation

## 2020-12-18 DIAGNOSIS — C50511 Malignant neoplasm of lower-outer quadrant of right female breast: Secondary | ICD-10-CM | POA: Insufficient documentation

## 2020-12-18 LAB — CBC WITH DIFFERENTIAL (CANCER CENTER ONLY)
Abs Immature Granulocytes: 0.01 10*3/uL (ref 0.00–0.07)
Basophils Absolute: 0.1 10*3/uL (ref 0.0–0.1)
Basophils Relative: 1 %
Eosinophils Absolute: 0.1 10*3/uL (ref 0.0–0.5)
Eosinophils Relative: 3 %
HCT: 32 % — ABNORMAL LOW (ref 36.0–46.0)
Hemoglobin: 11.1 g/dL — ABNORMAL LOW (ref 12.0–15.0)
Immature Granulocytes: 0 %
Lymphocytes Relative: 23 %
Lymphs Abs: 1 10*3/uL (ref 0.7–4.0)
MCH: 30.7 pg (ref 26.0–34.0)
MCHC: 34.7 g/dL (ref 30.0–36.0)
MCV: 88.4 fL (ref 80.0–100.0)
Monocytes Absolute: 0.3 10*3/uL (ref 0.1–1.0)
Monocytes Relative: 8 %
Neutro Abs: 2.6 10*3/uL (ref 1.7–7.7)
Neutrophils Relative %: 65 %
Platelet Count: 147 10*3/uL — ABNORMAL LOW (ref 150–400)
RBC: 3.62 MIL/uL — ABNORMAL LOW (ref 3.87–5.11)
RDW: 14.4 % (ref 11.5–15.5)
WBC Count: 4.1 10*3/uL (ref 4.0–10.5)
nRBC: 0 % (ref 0.0–0.2)

## 2020-12-18 LAB — CMP (CANCER CENTER ONLY)
ALT: 16 U/L (ref 0–44)
AST: 17 U/L (ref 15–41)
Albumin: 4.1 g/dL (ref 3.5–5.0)
Alkaline Phosphatase: 74 U/L (ref 38–126)
Anion gap: 8 (ref 5–15)
BUN: 13 mg/dL (ref 8–23)
CO2: 28 mmol/L (ref 22–32)
Calcium: 9.5 mg/dL (ref 8.9–10.3)
Chloride: 104 mmol/L (ref 98–111)
Creatinine: 0.95 mg/dL (ref 0.44–1.00)
GFR, Estimated: >60 mL/min (ref >60)
Glucose, Bld: 98 mg/dL (ref 70–99)
Potassium: 3.8 mmol/L (ref 3.5–5.1)
Sodium: 140 mmol/L (ref 135–145)
Total Bilirubin: 0.4 mg/dL (ref 0.3–1.2)
Total Protein: 7.1 g/dL (ref 6.5–8.1)

## 2020-12-18 NOTE — Progress Notes (Signed)
[place]       Telephone: [phone]?Fax: [phone]   Oncology Clinical Pharmacist Practitioner Progress Note  [name] was contacted via in-person visit to discuss her chemotherapy regimen for adjuvant abemaciclib.   Current treatment regimen and start date Abemaciclib 150 mg by mouth every 12 hours (started on 12/10/20) 100 mg by mouth every 12 hours started on 11/27/20 50 mg by mouth every 12 hours (started on 11/13/20) Anastrozole 1 mg by mouth (started 11/15/20)  Interval History She continues on abemaciclib 150 mg by mouth  every 12 hours on days 1 to 28 of a 28-day cycle. This is being given  in combination with anastrozole . Therapy is planned to continue until  2 years of therapy per the monarchE trial data .   Response to Therapy [name] is tolerating the abemaciclib very well.  She believes she increased to the 150 mg by mouth every 12 hours on 12/10/20 and has two 50 mg tablets left.  Explained how best to dispose of these tablets. Her labs were not back at the time of our visit and need to be redrawn per lab services.  Explained that we would call if any abnormalities that would prevent her from continuing abemaciclib.  She is only experiencing a little bit of a "yuck feeling" about 1 hour after taking the abemaciclib which she now takes at 8 am and 8 pm.  She feels it may be very mild nausea.  She is not taking anything for it presently but does have anti-nausea and anti-diarrheal meds at home if needed.  She is also having a little dry mouth when she first wakes up but states she is drinking up to 80 oz of water daily. Her current prescription will likely last her until around 01/07/21 so will send new prescription for 150 mg by mouth every 12 hours before that date and add refills if still tolerating the current dose.  She will have labs only on 01/01/21, labs/visit with Katelyn Bihari NP on 01/16/21, and then labs/visit with [name] on 02/17/21,  and labs/visit with pharmacy clinic on 03/17/21.  If stable at that time, can likely extend labs frequency/visits.  Allergies No Known Allergies  Laboratory Data CBC EXTENDED Latest Ref Rng & Units 12/18/2020 12/04/2020 11/20/2020  WBC 4.0 - 10.5 K/uL 4.1 5.0 4.7  RBC 3.87 - 5.11 MIL/uL 3.62(L) 3.88 4.02  HGB 12.0 - 15.0 g/dL 11.1(L) 11.6(L) 11.9(L)  HCT 36.0 - 46.0 % 32.0(L) 33.4(L) 35.4(L)  PLT 150 - 400 K/uL 147(L) 142(L) 194  NEUTROABS 1.7 - 7.7 K/uL 2.6 3.5 3.3  LYMPHSABS 0.7 - 4.0 K/uL 1.0 1.1 0.9    CMP Latest Ref Rng & Units 12/18/2020 12/04/2020 11/20/2020  Glucose 70 - 99 mg/dL 98 110(H) 96  BUN 8 - 23 mg/dL 13 13 15   Creatinine 0.44 - 1.00 mg/dL 0.95 0.83 0.93  Sodium 135 - 145 mmol/L 140 137 141  Potassium 3.5 - 5.1 mmol/L 3.8 3.8 4.0  Chloride 98 - 111 mmol/L 104 102 106  CO2 22 - 32 mmol/L 28 29 26   Calcium 8.9 - 10.3 mg/dL 9.5 9.5 9.5  Total Protein 6.5 - 8.1 g/dL 7.1 6.8 7.0  Total Bilirubin 0.3 - 1.2 mg/dL 0.4 0.6 0.4  Alkaline Phos 38 - 126 U/L 74 65 70  AST 15 - 41 U/L 17 18 15   ALT 0 - 44 U/L 16 19 16      Adverse Effects Assessment Mild to  no side effects as listed above Not taking any supportive care medications at this time  Adherence Assessment [name] reports missing 0 doses over the past 4 weeks.   Reason for missed dose: N/A Patient was re-educated on importance of adherence.   Access Assessment [name] is currently receiving her abemaciclib through [facility] concerns:  none  Medication Reconciliation The patient's medication list was reviewed today with the patient? Yes New medications or herbal supplements have recently been started? No  Any medications have been discontinued? No  The medication list was updated and reconciled based on the patient's most recent medication list in the electronic medical record (EMR) including herbal products and OTC medications.   Medications Current Outpatient  Medications  Medication Sig Dispense Refill   abemaciclib (VERZENIO) 150 MG tablet Take 1 tablet (150 mg total) by mouth 2 (two) times daily. Swallow tablets whole. Do not chew, crush, or split tablets before swallowing. Start once 50 mg tablets are exhausted. 60 tablet 0   anastrozole (ARIMIDEX) 1 MG tablet Take 1 tablet (1 mg total) by mouth daily. 90 tablet 3   calcium carbonate (OS-CAL) 1250 (500 Ca) MG chewable tablet Chew 1 tablet by mouth daily.     cholecalciferol (VITAMIN D3) 25 MCG (1000 UNIT) tablet Take 1,000 Units by mouth daily.     levothyroxine (SYNTHROID) 125 MCG tablet Take 1 tablet (125 mcg total) by mouth daily before breakfast. (Patient taking differently: Take 125 mcg by mouth daily before breakfast. Taking at night per patient/pcp)     loperamide (IMODIUM) 2 MG capsule Take by mouth as needed for diarrhea or loose stools. (Patient not taking: Reported on 11/20/2020)     Multiple Vitamins-Minerals (MULTIVITAMIN WITH MINERALS) tablet Take 1 tablet by mouth daily. (Patient not taking: Reported on 11/20/2020)     ondansetron (ZOFRAN) 8 MG tablet Take 8 mg by mouth every 8 (eight) hours as needed for nausea or vomiting. (Patient not taking: Reported on 11/20/2020)     prochlorperazine (COMPAZINE) 10 MG tablet Take 10 mg by mouth every 6 (six) hours as needed for nausea or vomiting. (Patient not taking: Reported on 11/20/2020)     rosuvastatin (CRESTOR) 10 MG tablet Take 1 tablet (10 mg total) by mouth daily.     No current facility-administered medications for this visit.    Drug-Drug Interactions (DDIs) DDIs were evaluated? Yes Significant DDIs? No  The patient was instructed to speak with their health care provider and/or the oral chemotherapy pharmacist before starting any new drug, including prescription or over the counter, natural / herbal products, or vitamins.  Supportive Care Diarrhea: we reviewed that diarrhea is common with abemaciclib and confirmed that she does have  loperamide (Imodium) at home.  We reviewed how to take this medication PRN. Neutropenia: we discussed the importance of having a thermometer and what CDC considers a fever which is 100.4 F or higher.  Gave patient 24/7 triage line to call if any fevers or symptoms ILD/Pneumonitis: we reviewed potential symptoms including cough, shortness, and fatigue.  Hepatotoxicity: labs not back yet at the time of dictation but will review and contact patient with any abnormalities. Hold medication if applicable VTE: reviewed signs of DVT such as leg swelling, redness, pain, or tenderness and signs of PE such as shortness of breath, rapid or irregular heartbeat, cough, chest pain, or lightheadedness Reviewed to take the medication every 12 hours (with food sometimes can be easier on the stomach) and  to take it at the same time every day. Discussed proper storage and handling of abemaciclib  Dosing Assessment Hepatic adjustments needed? No  Renal adjustments needed? No  Toxicity adjustments needed? No  The current dosing regimen is appropriate to continue at this time.  Follow-Up Plan DEXA on 12/25/20 and every 2 years while on anastrozole Lab only appointment on 01/01/21 Labs / visit with Katelyn Bihari NP on 01/16/21 Labs / visit with [name] on 02/17/21 Labs  / visit with pharmacy clinic on 03/17/21 After 03/17/21 visit, can likely extend lab/visit frequency  [name] participated in the discussion, expressed understanding, and voiced agreement with the above plan. All questions were answered to her satisfaction. The patient was advised to contact the clinic at [phone] with any questions or concerns prior to her return visit.   I spent 30 minutes assessing and educating the patient.  Raina Mina, RPH-CPP, 12/18/2020  3:00 PM

## 2020-12-25 ENCOUNTER — Other Ambulatory Visit (HOSPITAL_COMMUNITY): Payer: Self-pay

## 2020-12-25 ENCOUNTER — Other Ambulatory Visit: Payer: Commercial Managed Care - PPO

## 2020-12-26 ENCOUNTER — Other Ambulatory Visit (HOSPITAL_COMMUNITY): Payer: Self-pay

## 2020-12-26 ENCOUNTER — Other Ambulatory Visit: Payer: Self-pay | Admitting: Hematology and Oncology

## 2020-12-26 NOTE — Telephone Encounter (Signed)
[name], can you please review and refill if appropriate.  Thanks!

## 2020-12-29 ENCOUNTER — Ambulatory Visit: Payer: Commercial Managed Care - PPO | Admitting: Pharmacist

## 2020-12-29 ENCOUNTER — Other Ambulatory Visit (HOSPITAL_COMMUNITY): Payer: Self-pay

## 2020-12-29 ENCOUNTER — Encounter: Payer: Self-pay | Admitting: Hematology and Oncology

## 2020-12-29 MED ORDER — ABEMACICLIB 150 MG PO TABS
150.0000 mg | ORAL_TABLET | Freq: Two times a day (BID) | ORAL | 0 refills | Status: DC
  Filled 2020-12-29: qty 56, 28d supply, fill #0

## 2020-12-29 MED ORDER — PROCHLORPERAZINE MALEATE 10 MG PO TABS: 10.0000 mg | ORAL_TABLET | Freq: Four times a day (QID) | ORAL | 3 refills | Status: DC | PRN

## 2020-12-29 MED ORDER — ONDANSETRON HCL 8 MG PO TABS: 8.0000 mg | ORAL_TABLET | Freq: Three times a day (TID) | ORAL | 3 refills | Status: DC | PRN

## 2020-12-29 NOTE — Progress Notes (Signed)
[place]       Telephone: [phone]?Fax: [phone]   Oncology Clinical Pharmacist Practitioner Progress Note  [name] was contacted via telephone visit to discuss her chemotherapy regimen for abemaciclib / anastrozole. She currently receives this regimen under the care of Dr. [name].  Current treatment regimen and start date Abemaciclib 150 mg by mouth every 12 hours (started on 12/10/20) 100 mg by mouth every 12 hours started on 11/27/20 50 mg by mouth every 12 hours (started on 11/13/20) Anastrozole 1 mg by mouth (started 11/15/20)  Interval History She continues on abemaciclib 150 mg by mouth  every 12 hours on days 1 to 28 of a 28-day cycle. This is being given  in combination with anastrozole . Therapy is planned to continue until  two years of adjuvant therapy per the monarchE trial data .   Response to Therapy [name] had contacted the clinic on Friday 12/26/20 for a non-urgent drug related question with abemaciclib.  Call was returned this morning.  [name] reports having some nausea (no vomiting) since starting the full dose of abemaciclib of 150 mg every 12 hours on 12/10/20.  She stated the symptoms started on 12/22/20 and have been controlled by taking prochlorperazine.  She is running low on this medication and possibly ondansetron so both of these medications will be refilled at her pharmacy of choice. She is also have some loss of appetite but she continues to eat regularly even when not feeling hungry.  She continues to take the abemaciclib with food at 8 am / 8 pm.  We discussed taking the medication a little earlier and this is reasonable as long as the times are consistent and spaced out 12 hours. She had skipped the evening dose on Friday, Saturday, and Sunday. The last week she also felt as if she could not get warm but took her temperature and was afebrile.  We discussed that the abemaciclib may be causing all three symptoms she  is experiencing and that the options would be to continue on the current dose or reduce back to the 100 mg every 12 hours.  Explained that it is encouraging that the nausea does seem to be responding to the anti-emetics and that she is not experiencing any other symptoms at this time such as diarrhea and fatigue.  We reviewed some of the symptoms of warning signs such as pneumonitis and blood clots and she is not experiencing any of those symptoms.  After discussion, [name] would like to continue on the abemaciclib 150 mg every 12 hours. [facility] had sent a refill request so this will be refilled.   [name] has her next visit with Wilber Bihari NP on 01/16/21 and will receive labs at that time and then a follow up appointment with clinical pharmacy on 03/17/21. [name] knows to contact the clinic at [phone] should any new or worsening symptoms arise before her next scheduled visit. No labs were done today as this was a telephone visit.  Allergies No Known Allergies  Adverse Effects Assessment Nausea: controlled with prochlorperazine. Discussed possibly using ondansetron initially and then prochlorperazine since prochlorperazine can be sedating Loss of appetite: continues to eat. 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  Will let clinic know if this continues or becomes febrile  Adherence Assessment [name] reports missing 3 doses over the past 1 weeks.  Reason for missed dose: decided not to take  evening doses to see if she would feel better in morning Patient was re-educated on importance of adherence.    Access Assessment [name] is currently receiving her abemaciclib through [facility] concerns:  none  Medication Reconciliation The patient's medication list was reviewed today with the patient? No New medications or herbal supplements have  recently been started? No  Any medications have been discontinued? No  The medication list was updated and reconciled based on the patient's most recent medication list in the electronic medical record (EMR) including herbal products and OTC medications.   Medications Current Outpatient Medications  Medication Sig Dispense Refill   abemaciclib (VERZENIO) 150 MG tablet Take 1 tablet (150 mg total) by mouth 2 (two) times daily. Swallow tablets whole. Do not chew, crush, or split tablets before swallowing. Start once 50 mg tablets are exhausted. 60 tablet 0   anastrozole (ARIMIDEX) 1 MG tablet Take 1 tablet (1 mg total) by mouth daily. 90 tablet 3   calcium carbonate (OS-CAL) 1250 (500 Ca) MG chewable tablet Chew 1 tablet by mouth daily.     cholecalciferol (VITAMIN D3) 25 MCG (1000 UNIT) tablet Take 1,000 Units by mouth daily.     levothyroxine (SYNTHROID) 125 MCG tablet Take 1 tablet (125 mcg total) by mouth daily before breakfast. (Patient taking differently: Take 125 mcg by mouth daily before breakfast. Taking at night per patient/pcp)     loperamide (IMODIUM) 2 MG capsule Take by mouth as needed for diarrhea or loose stools. (Patient not taking: Reported on 11/20/2020)     Multiple Vitamins-Minerals (MULTIVITAMIN WITH MINERALS) tablet Take 1 tablet by mouth daily. (Patient not taking: Reported on 11/20/2020)     ondansetron (ZOFRAN) 8 MG tablet Take 8 mg by mouth every 8 (eight) hours as needed for nausea or vomiting. (Patient not taking: Reported on 11/20/2020)     prochlorperazine (COMPAZINE) 10 MG tablet Take 10 mg by mouth every 6 (six) hours as needed for nausea or vomiting. (Patient not taking: Reported on 11/20/2020)     rosuvastatin (CRESTOR) 10 MG tablet Take 1 tablet (10 mg total) by mouth daily.     No current facility-administered medications for this visit.    Drug-Drug Interactions (DDIs) DDIs were evaluated? Yes Significant DDIs? No  The patient was instructed to speak with their  health care provider and/or the oral chemotherapy pharmacist before starting any new drug, including prescription or over the counter, natural / herbal products, or vitamins.  Supportive Care Neutropenia: we discussed the importance of having a thermometer and what the Centers for Disease Control and Prevention (CDC) considers a fever which is 100.52F (38C) or higher.  Gave patient 24/7 triage line to call if any fevers or symptoms ILD/Pneumonitis: we reviewed potential symptoms including cough, shortness, and fatigue.  VTE: reviewed signs of DVT such as leg swelling, redness, pain, or tenderness and signs of PE such as shortness of breath, rapid or irregular heartbeat, cough, chest pain, or lightheadedness Reviewed to take the medication every 12 hours (with food sometimes can be easier on the stomach) and to take it at the same time every day.   Dosing Assessment Hepatic adjustments needed? No  Renal adjustments needed? No  Toxicity adjustments needed? No  The current dosing regimen is appropriate to continue at this time.  Follow-Up Plan Refills for ondansetron and prochlorperazine sent to [name]'s local pharmacy Refill for abemaciclib sent to [facility]  Pharmacy Continue to take anti-emetics as needed and will contact clinic if any new or worsening symptoms Labs / visit with Wilber Bihari NP on 01/16/21 Labs / visit with pharmacy clinic on 03/17/21  [name] participated in the discussion, expressed understanding, and voiced agreement with the above plan. All questions were answered to her satisfaction. The patient was advised to contact the clinic at [phone] with any questions or concerns prior to her return visit.   I spent 15 minutes assessing and educating the patient.  Raina Mina, RPH-CPP, 12/29/2020  9:10 AM

## 2021-01-01 ENCOUNTER — Other Ambulatory Visit: Payer: Self-pay

## 2021-01-01 ENCOUNTER — Inpatient Hospital Stay: Payer: Commercial Managed Care - PPO

## 2021-01-01 DIAGNOSIS — C50511 Malignant neoplasm of lower-outer quadrant of right female breast: Secondary | ICD-10-CM | POA: Diagnosis not present

## 2021-01-01 LAB — CMP (CANCER CENTER ONLY)
ALT: 25 U/L (ref 0–44)
AST: 20 U/L (ref 15–41)
Albumin: 4.2 g/dL (ref 3.5–5.0)
Alkaline Phosphatase: 72 U/L (ref 38–126)
Anion gap: 10 (ref 5–15)
BUN: 11 mg/dL (ref 8–23)
CO2: 26 mmol/L (ref 22–32)
Calcium: 9.5 mg/dL (ref 8.9–10.3)
Chloride: 105 mmol/L (ref 98–111)
Creatinine: 0.85 mg/dL (ref 0.44–1.00)
GFR, Estimated: >60 mL/min (ref >60)
Glucose, Bld: 103 mg/dL — ABNORMAL HIGH (ref 70–99)
Potassium: 3.6 mmol/L (ref 3.5–5.1)
Sodium: 141 mmol/L (ref 135–145)
Total Bilirubin: 0.4 mg/dL (ref 0.3–1.2)
Total Protein: 7 g/dL (ref 6.5–8.1)

## 2021-01-01 LAB — CBC WITH DIFFERENTIAL (CANCER CENTER ONLY)
Abs Immature Granulocytes: 0.02 10*3/uL (ref 0.00–0.07)
Basophils Absolute: 0.1 10*3/uL (ref 0.0–0.1)
Basophils Relative: 2 %
Eosinophils Absolute: 0.1 10*3/uL (ref 0.0–0.5)
Eosinophils Relative: 2 %
HCT: 30.2 % — ABNORMAL LOW (ref 36.0–46.0)
Hemoglobin: 10.7 g/dL — ABNORMAL LOW (ref 12.0–15.0)
Immature Granulocytes: 1 %
Lymphocytes Relative: 25 %
Lymphs Abs: 1 10*3/uL (ref 0.7–4.0)
MCH: 31.4 pg (ref 26.0–34.0)
MCHC: 35.4 g/dL (ref 30.0–36.0)
MCV: 88.6 fL (ref 80.0–100.0)
Monocytes Absolute: 0.3 10*3/uL (ref 0.1–1.0)
Monocytes Relative: 8 %
Neutro Abs: 2.6 10*3/uL (ref 1.7–7.7)
Neutrophils Relative %: 62 %
Platelet Count: 134 10*3/uL — ABNORMAL LOW (ref 150–400)
RBC: 3.41 MIL/uL — ABNORMAL LOW (ref 3.87–5.11)
RDW: 15.8 % — ABNORMAL HIGH (ref 11.5–15.5)
WBC Count: 4.1 10*3/uL (ref 4.0–10.5)
nRBC: 0 % (ref 0.0–0.2)

## 2021-01-15 ENCOUNTER — Other Ambulatory Visit: Payer: Self-pay | Admitting: General Surgery

## 2021-01-15 ENCOUNTER — Other Ambulatory Visit: Payer: Commercial Managed Care - PPO

## 2021-01-16 ENCOUNTER — Encounter: Payer: Commercial Managed Care - PPO | Admitting: Adult Health

## 2021-01-16 ENCOUNTER — Other Ambulatory Visit: Payer: Commercial Managed Care - PPO

## 2021-01-20 ENCOUNTER — Other Ambulatory Visit (HOSPITAL_COMMUNITY): Payer: Self-pay

## 2021-01-20 ENCOUNTER — Other Ambulatory Visit: Payer: Self-pay | Admitting: Hematology and Oncology

## 2021-01-21 NOTE — Telephone Encounter (Signed)
[name] can you please review and refill or change if need thanks!

## 2021-01-22 ENCOUNTER — Encounter: Payer: Self-pay | Admitting: Hematology and Oncology

## 2021-01-22 ENCOUNTER — Other Ambulatory Visit (HOSPITAL_COMMUNITY): Payer: Self-pay

## 2021-01-22 MED ORDER — ABEMACICLIB 150 MG PO TABS
150.0000 mg | ORAL_TABLET | Freq: Two times a day (BID) | ORAL | 3 refills | Status: DC
  Filled 2021-01-22: qty 56, 28d supply, fill #0
  Filled 2021-02-17 (×3): qty 56, 28d supply, fill #1
  Filled 2021-03-20 (×2): qty 56, 28d supply, fill #2
  Filled 2021-04-17: qty 56, 28d supply, fill #3

## 2021-01-23 ENCOUNTER — Other Ambulatory Visit (HOSPITAL_COMMUNITY): Payer: Self-pay

## 2021-01-27 ENCOUNTER — Telehealth: Payer: Self-pay | Admitting: *Deleted

## 2021-02-03 ENCOUNTER — Encounter: Payer: Self-pay | Admitting: Adult Health

## 2021-02-03 ENCOUNTER — Other Ambulatory Visit: Payer: Self-pay

## 2021-02-03 ENCOUNTER — Inpatient Hospital Stay: Payer: Commercial Managed Care - PPO | Attending: Hematology and Oncology

## 2021-02-03 ENCOUNTER — Inpatient Hospital Stay (HOSPITAL_BASED_OUTPATIENT_CLINIC_OR_DEPARTMENT_OTHER): Payer: Commercial Managed Care - PPO | Admitting: Adult Health

## 2021-02-03 VITALS — BP 130/58 | HR 77 | Temp 97.5°F | Resp 16 | Ht 67.0 in | Wt 145.4 lb

## 2021-02-03 DIAGNOSIS — Z9013 Acquired absence of bilateral breasts and nipples: Secondary | ICD-10-CM | POA: Diagnosis not present

## 2021-02-03 DIAGNOSIS — C50511 Malignant neoplasm of lower-outer quadrant of right female breast: Secondary | ICD-10-CM | POA: Diagnosis not present

## 2021-02-03 DIAGNOSIS — R5383 Other fatigue: Secondary | ICD-10-CM | POA: Insufficient documentation

## 2021-02-03 DIAGNOSIS — E785 Hyperlipidemia, unspecified: Secondary | ICD-10-CM | POA: Insufficient documentation

## 2021-02-03 DIAGNOSIS — Z923 Personal history of irradiation: Secondary | ICD-10-CM | POA: Insufficient documentation

## 2021-02-03 DIAGNOSIS — H02409 Unspecified ptosis of unspecified eyelid: Secondary | ICD-10-CM | POA: Insufficient documentation

## 2021-02-03 DIAGNOSIS — R630 Anorexia: Secondary | ICD-10-CM | POA: Diagnosis not present

## 2021-02-03 DIAGNOSIS — Z9221 Personal history of antineoplastic chemotherapy: Secondary | ICD-10-CM | POA: Insufficient documentation

## 2021-02-03 DIAGNOSIS — E039 Hypothyroidism, unspecified: Secondary | ICD-10-CM | POA: Insufficient documentation

## 2021-02-03 DIAGNOSIS — Z85850 Personal history of malignant neoplasm of thyroid: Secondary | ICD-10-CM | POA: Insufficient documentation

## 2021-02-03 DIAGNOSIS — Z170 Estrogen receptor positive status [ER+]: Secondary | ICD-10-CM | POA: Diagnosis not present

## 2021-02-03 DIAGNOSIS — R3129 Other microscopic hematuria: Secondary | ICD-10-CM | POA: Insufficient documentation

## 2021-02-03 DIAGNOSIS — I700 Atherosclerosis of aorta: Secondary | ICD-10-CM | POA: Insufficient documentation

## 2021-02-03 LAB — CMP (CANCER CENTER ONLY)
ALT: 40 U/L (ref 0–44)
AST: 29 U/L (ref 15–41)
Albumin: 4.2 g/dL (ref 3.5–5.0)
Alkaline Phosphatase: 69 U/L (ref 38–126)
Anion gap: 6 (ref 5–15)
BUN: 12 mg/dL (ref 8–23)
CO2: 28 mmol/L (ref 22–32)
Calcium: 9.5 mg/dL (ref 8.9–10.3)
Chloride: 106 mmol/L (ref 98–111)
Creatinine: 0.77 mg/dL (ref 0.44–1.00)
GFR, Estimated: >60 mL/min (ref >60)
Glucose, Bld: 108 mg/dL — ABNORMAL HIGH (ref 70–99)
Potassium: 3.9 mmol/L (ref 3.5–5.1)
Sodium: 140 mmol/L (ref 135–145)
Total Bilirubin: 0.6 mg/dL (ref 0.3–1.2)
Total Protein: 6.8 g/dL (ref 6.5–8.1)

## 2021-02-03 LAB — CBC WITH DIFFERENTIAL (CANCER CENTER ONLY)
Abs Immature Granulocytes: 0.03 10*3/uL (ref 0.00–0.07)
Basophils Absolute: 0.1 10*3/uL (ref 0.0–0.1)
Basophils Relative: 3 %
Eosinophils Absolute: 0.1 10*3/uL (ref 0.0–0.5)
Eosinophils Relative: 3 %
HCT: 28 % — ABNORMAL LOW (ref 36.0–46.0)
Hemoglobin: 10 g/dL — ABNORMAL LOW (ref 12.0–15.0)
Immature Granulocytes: 1 %
Lymphocytes Relative: 23 %
Lymphs Abs: 0.9 10*3/uL (ref 0.7–4.0)
MCH: 33.8 pg (ref 26.0–34.0)
MCHC: 35.7 g/dL (ref 30.0–36.0)
MCV: 94.6 fL (ref 80.0–100.0)
Monocytes Absolute: 0.3 10*3/uL (ref 0.1–1.0)
Monocytes Relative: 8 %
Neutro Abs: 2.6 10*3/uL (ref 1.7–7.7)
Neutrophils Relative %: 62 %
Platelet Count: 168 10*3/uL (ref 150–400)
RBC: 2.96 MIL/uL — ABNORMAL LOW (ref 3.87–5.11)
RDW: 17.2 % — ABNORMAL HIGH (ref 11.5–15.5)
WBC Count: 4.1 10*3/uL (ref 4.0–10.5)
nRBC: 0 % (ref 0.0–0.2)

## 2021-02-03 NOTE — Progress Notes (Signed)
SURVIVORSHIP VISIT:    BRIEF ONCOLOGIC HISTORY:  Oncology History  Malignant neoplasm of lower-outer quadrant of right breast of female, estrogen receptor positive ([place])  08/01/2019 Initial Diagnosis   Palpable right breast mass for 1 month.  Mammogram revealed a 5 cm mass 8:30 position, 3 cm intramammary lymph node at 10 o'clock position and 2 enlarged right axillary lymph nodes.  Biopsy revealed grade 1 IDC ER 90%, PR 10%, Ki-67 10%, HER-2 negative.  Intramammary node and axillary lymph node both positive with similar prognostic profile   08/08/2019 -  Neo-Adjuvant Anti-estrogen oral therapy   Anastrozole while deciding treatment plan    02/28/2020 Surgery   Bilateral mastectomies [name]):  Right breast: IDC, 6.5cm, metastatic carcinoma in 6/7 right axillary lymph nodes, 1.5cm, 1.5cm, and 0.3cm, and one intramammary lymph node, 2.5cm Left breast: no evidence of malignancy in the breast or 1 left axillary lymph node.   03/26/2020 - 08/06/2020 Chemotherapy   Dose dense Adriamycin and Cytoxan followed by Taxol x12   07/31/2020 Cancer Staging   Staging form: Breast, AJCC 8th Edition - Pathologic stage from 07/31/2020: Stage IB (pT3, pN2a, cM0, G1, ER+, PR+, HER2-) - Signed by Gardenia Phlegm, NP on 02/03/2021 Histologic grading system: 3 grade system    09/09/2020 - 10/24/2020 Radiation Therapy   Adjuvant radiation   11/2020 -  Anti-estrogen oral therapy    Antiestrogen therapy with anastrozole 1 mg daily (started 11/15/2020), Verzenio started 11/24/2020 (gradually escalating dose)     INTERVAL HISTORY:  Ms. Carriger to review her survivorship care plan detailing her treatment course for breast cancer, as well as monitoring long-term side effects of that treatment, education regarding health maintenance, screening, and overall wellness and health promotion.     Overall, Ms. Forney reports feeling quite well.  She is taking anastrozole daily and is tolerating this well.  She denies  arthralgias, hot flashes, or vaginal dryness.    She is also taking Verzenio BID and has ramped up to 174m PO BID.  Initially she experienced some diarrhea and nausea, however that has resolved.    REVIEW OF SYSTEMS:  Review of Systems  Constitutional:  Positive for appetite change (Dinner appetite is slightly decreased.) and fatigue. Negative for chills, fever and unexpected weight change.  HENT:   Negative for hearing loss, lump/mass and trouble swallowing.   Eyes:  Negative for eye problems and icterus.  Respiratory:  Negative for chest tightness, cough and shortness of breath.   Cardiovascular:  Negative for chest pain, leg swelling and palpitations.  Gastrointestinal:  Negative for abdominal distention, abdominal pain, constipation, diarrhea, nausea and vomiting.  Endocrine: Negative for hot flashes.  Genitourinary:  Negative for difficulty urinating.   Musculoskeletal:  Negative for arthralgias.  Skin:  Negative for itching and rash.  Neurological:  Negative for dizziness, extremity weakness, headaches and numbness.  Hematological:  Negative for adenopathy. Does not bruise/bleed easily.  Psychiatric/Behavioral:  Negative for depression. The patient is not nervous/anxious.   Breast: Denies any new nodularity, masses, tenderness, nipple changes, or nipple discharge.   ONCOLOGY TREATMENT TEAM:  1. Surgeon:  [facility] 2. Medical Oncologist: Dr. GLindi Adie3. Radiation Oncologist: Dr. MLisbeth Renshaw   PAST MEDICAL/SURGICAL HISTORY:  Past Medical History:  Diagnosis Date   Complication of anesthesia    Dyslipidemia    Hypothyroidism    Pneumonia    PONV (postoperative nausea and vomiting)    Thyroid cancer ([facility]    Past Surgical History:  Procedure Laterality  Date   MASTECTOMY WITH AXILLARY LYMPH NODE DISSECTION Bilateral 02/28/2020   Procedure: BILATERAL MASTECTOMY WITH RIGHT SENTINEL LYMPH NODE MAPPING AND RIGHT TARGETED RADIOACTIVE SEED GUIDED  LYMPH NODE  DISSECTION;  Surgeon: Jovita Kussmaul, MD;  Location: [place];  Service: General;  Laterality: Bilateral;  PEC BLOCK, RNFA (SHARON HITCHCOCK)   PORT-A-CATH REMOVAL Left 10/13/2020   Procedure: REMOVAL PORT-A-CATH;  Surgeon: Jovita Kussmaul, MD;  Location: [place];  Service: General;  Laterality: Left;   PORTACATH PLACEMENT Left 03/21/2020   Procedure: PORT PLACEMENT WITH ULTRASOUND;  Surgeon: Jovita Kussmaul, MD;  Location: [place];  Service: General;  Laterality: Left;   THYROIDECTOMY       ALLERGIES:  No Known Allergies   CURRENT MEDICATIONS:  Outpatient Encounter Medications as of 02/03/2021  Medication Sig   abemaciclib (VERZENIO) 150 MG tablet Take 1 tablet (150 mg total) by mouth 2 (two) times daily. Swallow tablets whole. Do not chew, crush, or split tablets before swallowing.   anastrozole (ARIMIDEX) 1 MG tablet Take 1 tablet (1 mg total) by mouth daily.   calcium carbonate (OS-CAL) 1250 (500 Ca) MG chewable tablet Chew 1 tablet by mouth daily.   cholecalciferol (VITAMIN D3) 25 MCG (1000 UNIT) tablet Take 1,000 Units by mouth daily.   levothyroxine (SYNTHROID) 125 MCG tablet Take 1 tablet (125 mcg total) by mouth daily before breakfast. (Patient taking differently: Take 100 mcg by mouth daily before breakfast. Pt is taking 100 mcg since Dec. 13,2022 per PCP)   rosuvastatin (CRESTOR) 10 MG tablet Take 1 tablet (10 mg total) by mouth daily.   loperamide (IMODIUM) 2 MG capsule Take by mouth as needed for diarrhea or loose stools. (Patient not taking: Reported on 11/20/2020)   Multiple Vitamins-Minerals (MULTIVITAMIN WITH MINERALS) tablet Take 1 tablet by mouth daily. (Patient not taking: Reported on 11/20/2020)   ondansetron (ZOFRAN) 8 MG tablet Take 1 tablet (8 mg total) by mouth every 8 (eight) hours as needed for nausea or vomiting. (Patient not taking: Reported on 02/03/2021)   prochlorperazine (COMPAZINE) 10 MG tablet Take 1 tablet (10 mg total) by mouth  every 6 (six) hours as needed for nausea or vomiting. (Patient not taking: Reported on 02/03/2021)   No facility-administered encounter medications on file as of 02/03/2021.     ONCOLOGIC FAMILY HISTORY:  Family History  Problem Relation Age of Onset   Breast cancer Paternal Grandmother 9     GENETIC COUNSELING/TESTING: Not at this time  SOCIAL HISTORY:  Social History   Socioeconomic History   Marital status: Married    Spouse name: Not on file   Number of children: Not on file   Years of education: Not on file   Highest education level: Not on file  Occupational History   Not on file  Tobacco Use   Smoking status: Never   Smokeless tobacco: Never  Vaping Use   Vaping Use: Never used  Substance and Sexual Activity   Alcohol use: Never   Drug use: Never   Sexual activity: Not on file  Other Topics Concern   Not on file  Social History Narrative   Not on file   Social Determinants of Health   Financial Resource Strain: Not on file  Food Insecurity: Not on file  Transportation Needs: Not on file  Physical Activity: Not on file  Stress: Not on file  Social Connections: Not on file  Intimate Partner Violence: Not on file     OBSERVATIONS/OBJECTIVE:  BP (!) 130/58 (BP Location: Left Arm, Patient Position: Sitting)    Pulse 77    Temp (!) 97.5 F (36.4 C) (Temporal)    Resp 16    Ht _0  (1.702 m)    Wt 145 lb 6.4 oz (66 kg)    SpO2 100%    BMI 22.77 kg/m  GENERAL: Patient is a well appearing female in no acute distress HEENT:  Sclerae anicteric.  Oropharynx clear and moist. No ulcerations or evidence of oropharyngeal candidiasis. Neck is supple.  NODES:  No cervical, supraclavicular, or axillary lymphadenopathy palpated.  BREAST EXAM:  Deferred. LUNGS:  Clear to auscultation bilaterally.  No wheezes or rhonchi. HEART:  Regular rate and rhythm. No murmur appreciated. ABDOMEN:  Soft, nontender.  Positive, normoactive bowel sounds. No organomegaly  palpated. MSK:  No focal spinal tenderness to palpation. Full range of motion bilaterally in the upper extremities. EXTREMITIES:  No peripheral edema.   SKIN:  Clear with no obvious rashes or skin changes. No nail dyscrasia. NEURO:  Nonfocal. Well oriented.  Appropriate affect.  LABORATORY DATA:  None for this visit.  DIAGNOSTIC IMAGING:  None for this visit.      ASSESSMENT AND PLAN:  Ms.. Bowditch is a pleasant 65 y.o. female with Stage 1B right breast invasive ductal carcinoma, ER+/PR+/HER2-, diagnosed in June 2021, treated with bilateral mastectomies, adjuvant chemotherapy, adjuvant radiation therapy, and anti-estrogen therapy with anastrozole and Verzenio beginning in October, 2022.  She presents to the Survivorship Clinic for our initial meeting and routine follow-up post-completion of treatment for breast cancer.    1. Stage 1B right breast cancer:  Ms. Craker is continuing to recover from definitive treatment for breast cancer. She will follow-up with her medical oncologist, Dr. Lindi Adie in January, 2023 with history and physical exam per surveillance protocol.  She will continue her anti-estrogen therapy with anastrozole and Verzenio.. Today, a comprehensive survivorship care plan and treatment summary was reviewed with the patient today detailing her breast cancer diagnosis, treatment course, potential late/long-term effects of treatment, appropriate follow-up care with recommendations for the future, and patient education resources.  A copy of this summary, along with a letter will be sent to the patients primary care provider via mail/fax/In Basket message after todays visit.    2.  Fatigue and decreased appetite.  This is likely secondary to the [facility].  I suggested that at dinnertime instead of having a decreased amount of the meal that she instead drink a protein drink that way she can get the correct amount of nutrients.  3. Bone health:  Given Ms. Gatta's age/history of  breast cancer and her current treatment regimen including anti-estrogen therapy with anastrozole, she is at risk for bone demineralization.  She is scheduled for bone density testing in 2 days. She was given education on specific activities to promote bone health.  4. Cancer screening:  Due to Ms. Kilroy's history and her age, she should receive screening for skin cancers, colon cancer, and gynecologic cancers.  The information and recommendations are listed on the patient's comprehensive care plan/treatment summary and were reviewed in detail with the patient.    5. Health maintenance and wellness promotion: Ms. Calkin was encouraged to consume 5-7 servings of fruits and vegetables per day. We reviewed the "Nutrition Rainbow" handout.  She was also encouraged to engage in moderate to vigorous exercise for 30 minutes per day most days of the week. We discussed the [employer] fitness program, which is designed for cancer survivors to  help them become more physically fit after cancer treatments.  She was instructed to limit her alcohol consumption and continue to abstain from tobacco use.     6. Support services/counseling: It is not uncommon for this period of the patient's cancer care trajectory to be one of many emotions and stressors.  We discussed how this can be increasingly difficult during the times of quarantine and social distancing due to the COVID-19 pandemic.   She was given information regarding our available services and encouraged to contact me with any questions or for help enrolling in any of our support group/programs.    Follow up instructions:    -Return to cancer center next month for labs and follow-up with Dr. Lindi Adie -She is welcome to return back to the Survivorship Clinic at any time; no additional follow-up needed at this time.  -Consider referral back to survivorship as a long-term survivor for continued surveillance  The patient was provided an opportunity to ask  questions and all were answered. The patient agreed with the plan and demonstrated an understanding of the instructions.   Total encounter time: 40 minutes in face to face visit time, chart review, lab review, care coordination, and documentation of the encounter.   Wilber Bihari, NP 02/03/21 11:36 AM Medical Oncology and Hematology [facility] [place], [place] [address] Tel. [phone]    Fax. [phone]  *Total Encounter Time as defined by the Centers for Medicare and Medicaid Services includes, in addition to the face-to-face time of a patient visit (documented in the note above) non-face-to-face time: obtaining and reviewing outside history, ordering and reviewing medications, tests or procedures, care coordination (communications with other health care professionals or caregivers) and documentation in the medical record.

## 2021-02-05 ENCOUNTER — Ambulatory Visit
Admission: RE | Admit: 2021-02-05 | Discharge: 2021-02-05 | Disposition: A | Discharge status: Home / Self Care | Payer: Commercial Managed Care - PPO | Source: Ambulatory Visit | Attending: Hematology and Oncology | Admitting: Hematology and Oncology

## 2021-02-05 DIAGNOSIS — Z170 Estrogen receptor positive status [ER+]: Secondary | ICD-10-CM

## 2021-02-16 NOTE — Progress Notes (Signed)
Patient Care Team: Deland Pretty, MD as PCP - General (Internal Medicine) [name], MD as Consulting Physician (Hematology and Oncology) Jovita Kussmaul, MD as Consulting Physician (General Surgery) Kyung Rudd, MD as Consulting Physician (Radiation Oncology)  DIAGNOSIS:    ICD-10-CM   1. Malignant neoplasm of lower-outer quadrant of right breast of female, estrogen receptor positive ([place])  C50.511 CBC with Differential ([place] Only)   Z17.0 [place] ([place] only)      SUMMARY OF ONCOLOGIC HISTORY: Oncology History  Malignant neoplasm of lower-outer quadrant of right breast of female, estrogen receptor positive ([place])  08/01/2019 Initial Diagnosis   Palpable right breast mass for 1 month.  Mammogram revealed a 5 cm mass 8:30 position, 3 cm intramammary lymph node at 10 o'clock position and 2 enlarged right axillary lymph nodes.  Biopsy revealed grade 1 IDC ER 90%, PR 10%, Ki-67 10%, HER-2 negative.  Intramammary node and axillary lymph node both positive with similar prognostic profile   08/08/2019 -  Neo-Adjuvant Anti-estrogen oral therapy   Anastrozole while deciding treatment plan    02/28/2020 Surgery   Bilateral mastectomies [name]):  Right breast: IDC, 6.5cm, metastatic carcinoma in 6/7 right axillary lymph nodes, 1.5cm, 1.5cm, and 0.3cm, and one intramammary lymph node, 2.5cm Left breast: no evidence of malignancy in the breast or 1 left axillary lymph node.   03/26/2020 - 08/06/2020 Chemotherapy   Dose dense Adriamycin and Cytoxan followed by Taxol x12   07/31/2020 Cancer Staging   Staging form: Breast, AJCC 8th Edition - Pathologic stage from 07/31/2020: Stage IB (pT3, pN2a, cM0, G1, ER+, PR+, HER2-) - Signed by Gardenia Phlegm, NP on 02/03/2021 Histologic grading system: 3 grade system    09/09/2020 - 10/24/2020 Radiation Therapy   Adjuvant radiation   11/2020 -  Anti-estrogen oral therapy    Antiestrogen therapy with anastrozole 1 mg daily (started  11/15/2020), Verzenio started 11/24/2020 (gradually escalating dose)     CHIEF COMPLIANT: Follow-up of right breast cancer  INTERVAL HISTORY: [name] is a 66 y.o. with above-mentioned history of  right breast cancer treated with neoadjuvant antiestrogen therapy, bilateral mastectomies, and adjuvant chemotherapy, currently on radiation therapy. She presents to the clinic today for follow-up.  She is currently on Verzenio and appears to be tolerating it fairly well.  She is on 150 mg p.o. twice daily dosage.  Initially when she went to the 150 mg dose that she felt nauseated but it quickly subsided with antinausea medication.  ALLERGIES:  has No Known Allergies.  MEDICATIONS:  Current Outpatient Medications  Medication Sig Dispense Refill   abemaciclib (VERZENIO) 150 MG tablet Take 1 tablet (150 mg total) by mouth 2 (two) times daily. Swallow tablets whole. Do not chew, crush, or split tablets before swallowing. 56 tablet 3   anastrozole (ARIMIDEX) 1 MG tablet Take 1 tablet (1 mg total) by mouth daily. 90 tablet 3   calcium carbonate (OS-CAL) 1250 (500 Ca) MG chewable tablet Chew 1 tablet by mouth daily.     cholecalciferol (VITAMIN D3) 25 MCG (1000 UNIT) tablet Take 1,000 Units by mouth daily.     levothyroxine (SYNTHROID) 125 MCG tablet Take 1 tablet (125 mcg total) by mouth daily before breakfast. (Patient taking differently: Take 100 mcg by mouth daily before breakfast. Pt is taking 100 mcg since Dec. 13,2022 per PCP)     loperamide (IMODIUM) 2 MG capsule Take by mouth as needed for diarrhea or loose stools. (Patient not taking: Reported on 11/20/2020)  Multiple Vitamins-Minerals (MULTIVITAMIN WITH MINERALS) tablet Take 1 tablet by mouth daily. (Patient not taking: Reported on 11/20/2020)     ondansetron (ZOFRAN) 8 MG tablet Take 1 tablet (8 mg total) by mouth every 8 (eight) hours as needed for nausea or vomiting. (Patient not taking: Reported on 02/03/2021) 30 tablet 3    prochlorperazine (COMPAZINE) 10 MG tablet Take 1 tablet (10 mg total) by mouth every 6 (six) hours as needed for nausea or vomiting. (Patient not taking: Reported on 02/03/2021) 30 tablet 3   rosuvastatin (CRESTOR) 10 MG tablet Take 1 tablet (10 mg total) by mouth daily.     No current facility-administered medications for this visit.    PHYSICAL EXAMINATION: ECOG PERFORMANCE STATUS: 1 - Symptomatic but completely ambulatory  Vitals:   02/17/21 1524  BP: (!) 117/55  Pulse: 79  Resp: 16  Temp: (!) 97.5 F (36.4 C)  SpO2: 100%   Filed Weights   02/17/21 1524  Weight: 143 lb 9.6 oz (65.1 kg)      LABORATORY DATA:  I have reviewed the data as listed CMP Latest Ref Rng & Units 02/17/2021 02/03/2021 01/01/2021  Glucose 70 - 99 mg/dL 95 108(H) 103(H)  BUN 8 - 23 mg/dL _0 Creatinine 0.44 - 1.00 mg/dL 0.91 0.77 0.85  Sodium 135 - 145 mmol/L 140 140 141  Potassium 3.5 - 5.1 mmol/L 3.9 3.9 3.6  Chloride 98 - 111 mmol/L 105 106 105  CO2 22 - 32 mmol/L _1 Calcium 8.9 - 10.3 mg/dL 9.4 9.5 9.5  Total Protein 6.5 - 8.1 g/dL 7.0 6.8 7.0  Total Bilirubin 0.3 - 1.2 mg/dL 0.5 0.6 0.4  Alkaline Phos 38 - 126 U/L 71 69 72  AST 15 - 41 U/L _2 ALT 0 - 44 U/L 24 40 25    Lab Results  Component Value Date   WBC 3.9 (L) 02/17/2021   HGB 10.0 (L) 02/17/2021   HCT 28.7 (L) 02/17/2021   MCV 98.3 02/17/2021   PLT 142 (L) 02/17/2021   NEUTROABS 2.3 02/17/2021    ASSESSMENT & PLAN:  Malignant neoplasm of lower-outer quadrant of right breast of female, estrogen receptor positive ([place]) 08/01/2019:Palpable right breast mass for 1 month.  Mammogram revealed a 5 cm mass 8:30 position, 3 cm intramammary lymph node at 10 o'clock position and 2 enlarged right axillary lymph nodes.  Biopsy revealed grade 1 IDC ER 90%, PR 10%, Ki-67 10%, HER-2 negative.  Intramammary node and axillary lymph node both positive with similar prognostic profile   Breast MRI 08/15/2019: Biopsy-proven  malignancy lower outer quadrant right breast, non-mass enhancement spanning 8.7 cm with probable invasion of underlying pectoral muscle.  Biopsy-proven metastatic lymph node in the right axillary tail.  Mild asymmetric right infraclavicular adenopathy.    Biopsy positive for invasive ductal carcinoma grade 1   CT CAP 08/24/2019: Right axillary and right retropectoral adenopathy compatible with nodal metastases.  Indistinct sclerotic 1.2 cm left acetabular lesion.  No other distant metastases  MammaPrint: Luminal type A, low risk, no benefit to chemotherapy Bone scan: Negative for metastatic disease   Treatment Summary:  1. Neoadjuvant antiestrogen therapy with anastrozole 1 mg daily started 08/08/2019 2. bilateral mastectomies 02/28/2020: Right mastectomy with ALND: IDC 6.5 cm, 7/8 lymph nodes positive; left mastectomy: Benign ER 90%, PR 10%, Ki-67 10%, HER2 negative 3. given the fact that the patient had 7 positive lymph nodes, she received dose dense Adriamycin and Cytoxan followed by Taxol  completed 08/06/2020 4.  Adj radiation 09/09/2020-10/24/2020 5.  Followed by adjuvant antiestrogen therapy with letrozole and abemaciclib ----------------------------------------------------------------------------------------------------------------------------- Treatment plan: Antiestrogen therapy with anastrozole 1 mg daily (started 11/15/2020), Verzenio  started 11/14/2020  (gradually escalating dosing)  Verzenio toxicities: 1.  Intermittent diarrhea 2. intermittent nausea: Being treated with Compazine 3.  Anemia: Hemoglobin stable at 10 g  Since she had bilateral mastectomies there is no role of imaging studies. I would like to obtain CT chest abdomen pelvis and bone scan in July 2023. Patient will come back in 1 month to see John.  After that she could be seen every other month. I will see her back in 3 months.  She can alternate seeing Jenny Reichmann and myself subsequently.    Orders Placed This Encounter   Procedures   CBC with Differential ([place] Only)    Standing Status:   Future    Standing Expiration Date:   02/17/2022   CMP ([place] only)    Standing Status:   Future    Standing Expiration Date:   02/17/2022   The patient has a good understanding of the overall plan. she agrees with it. she will call with any problems that may develop before the next visit here.  Total time spent: 30 mins including face to face time and time spent for planning, charting and coordination of care  Rulon Eisenmenger, MD, MPH 02/17/2021  I, Thana Ates, am acting as scribe for Dr. [name].  I have reviewed the above documentation for accuracy and completeness, and I agree with the above.

## 2021-02-17 ENCOUNTER — Other Ambulatory Visit: Payer: Self-pay

## 2021-02-17 ENCOUNTER — Other Ambulatory Visit (HOSPITAL_COMMUNITY): Payer: Self-pay

## 2021-02-17 ENCOUNTER — Inpatient Hospital Stay (HOSPITAL_BASED_OUTPATIENT_CLINIC_OR_DEPARTMENT_OTHER): Payer: Commercial Managed Care - PPO | Admitting: Hematology and Oncology

## 2021-02-17 ENCOUNTER — Encounter: Payer: Self-pay | Admitting: Hematology and Oncology

## 2021-02-17 ENCOUNTER — Inpatient Hospital Stay: Payer: Commercial Managed Care - PPO | Attending: Hematology and Oncology

## 2021-02-17 VITALS — BP 117/55 | HR 79 | Temp 97.5°F | Resp 16 | Ht 67.0 in | Wt 143.6 lb

## 2021-02-17 DIAGNOSIS — Z9221 Personal history of antineoplastic chemotherapy: Secondary | ICD-10-CM | POA: Insufficient documentation

## 2021-02-17 DIAGNOSIS — D649 Anemia, unspecified: Secondary | ICD-10-CM | POA: Diagnosis not present

## 2021-02-17 DIAGNOSIS — Z170 Estrogen receptor positive status [ER+]: Secondary | ICD-10-CM | POA: Insufficient documentation

## 2021-02-17 DIAGNOSIS — Z79899 Other long term (current) drug therapy: Secondary | ICD-10-CM | POA: Insufficient documentation

## 2021-02-17 DIAGNOSIS — Z9013 Acquired absence of bilateral breasts and nipples: Secondary | ICD-10-CM | POA: Insufficient documentation

## 2021-02-17 DIAGNOSIS — R110 Nausea: Secondary | ICD-10-CM | POA: Insufficient documentation

## 2021-02-17 DIAGNOSIS — Z79811 Long term (current) use of aromatase inhibitors: Secondary | ICD-10-CM | POA: Diagnosis not present

## 2021-02-17 DIAGNOSIS — C773 Secondary and unspecified malignant neoplasm of axilla and upper limb lymph nodes: Secondary | ICD-10-CM | POA: Diagnosis not present

## 2021-02-17 DIAGNOSIS — C50511 Malignant neoplasm of lower-outer quadrant of right female breast: Secondary | ICD-10-CM | POA: Diagnosis present

## 2021-02-17 LAB — CBC WITH DIFFERENTIAL (CANCER CENTER ONLY)
Abs Immature Granulocytes: 0.01 10*3/uL (ref 0.00–0.07)
Basophils Absolute: 0.1 10*3/uL (ref 0.0–0.1)
Basophils Relative: 2 %
Eosinophils Absolute: 0.1 10*3/uL (ref 0.0–0.5)
Eosinophils Relative: 3 %
HCT: 28.7 % — ABNORMAL LOW (ref 36.0–46.0)
Hemoglobin: 10 g/dL — ABNORMAL LOW (ref 12.0–15.0)
Immature Granulocytes: 0 %
Lymphocytes Relative: 27 %
Lymphs Abs: 1.1 10*3/uL (ref 0.7–4.0)
MCH: 34.2 pg — ABNORMAL HIGH (ref 26.0–34.0)
MCHC: 34.8 g/dL (ref 30.0–36.0)
MCV: 98.3 fL (ref 80.0–100.0)
Monocytes Absolute: 0.3 10*3/uL (ref 0.1–1.0)
Monocytes Relative: 9 %
Neutro Abs: 2.3 10*3/uL (ref 1.7–7.7)
Neutrophils Relative %: 59 %
Platelet Count: 142 10*3/uL — ABNORMAL LOW (ref 150–400)
RBC: 2.92 MIL/uL — ABNORMAL LOW (ref 3.87–5.11)
RDW: 16.1 % — ABNORMAL HIGH (ref 11.5–15.5)
WBC Count: 3.9 10*3/uL — ABNORMAL LOW (ref 4.0–10.5)
nRBC: 0 % (ref 0.0–0.2)

## 2021-02-17 LAB — CMP (CANCER CENTER ONLY)
ALT: 24 U/L (ref 0–44)
AST: 17 U/L (ref 15–41)
Albumin: 4.3 g/dL (ref 3.5–5.0)
Alkaline Phosphatase: 71 U/L (ref 38–126)
Anion gap: 5 (ref 5–15)
BUN: 13 mg/dL (ref 8–23)
CO2: 30 mmol/L (ref 22–32)
Calcium: 9.4 mg/dL (ref 8.9–10.3)
Chloride: 105 mmol/L (ref 98–111)
Creatinine: 0.91 mg/dL (ref 0.44–1.00)
GFR, Estimated: >60 mL/min (ref >60)
Glucose, Bld: 95 mg/dL (ref 70–99)
Potassium: 3.9 mmol/L (ref 3.5–5.1)
Sodium: 140 mmol/L (ref 135–145)
Total Bilirubin: 0.5 mg/dL (ref 0.3–1.2)
Total Protein: 7 g/dL (ref 6.5–8.1)

## 2021-02-17 NOTE — Assessment & Plan Note (Signed)
08/01/2019:Palpable right breast mass for 1 month. Mammogram revealed a 5 cm mass 8:30 position, 3 cm intramammary lymph node at 10 o'clock position and 2 enlarged right axillary lymph nodes. Biopsy revealed grade 1 IDC ER 90%, PR 10%, Ki-67 10%, HER-2 negative. Intramammary node and axillary lymph node both positive with similar prognostic profile  Breast MRI 08/15/2019: Biopsy-proven malignancy lower outer quadrant right breast, non-mass enhancement spanning 8.7 cm with probable invasion of underlying pectoral muscle. Biopsy-proven metastatic lymph node in the right axillary tail. Mild asymmetric right infraclavicular adenopathy.Biopsy positive for invasive ductal carcinoma grade 1  CT CAP 08/24/2019: Right axillary and right retropectoral adenopathy compatible with nodal metastases. Indistinct sclerotic 1.2 cm left acetabular lesion. No other distant metastases MammaPrint: Luminal type A, low risk, no benefit to chemotherapy Bone scan: Negative for metastatic disease  TreatmentSummary: 1.Neoadjuvant antiestrogen therapy with anastrozole 1 mg daily started 08/08/2019 2.bilateral mastectomies1/13/2022: Right mastectomy with ALND: IDC 6.5 cm, 7/8 lymph nodes positive;left mastectomy: Benign ER 90%, PR 10%, Ki-67 10%, HER2 negative 3.given the fact that the patient had 7 positive lymph nodes, she receiveddose dense Adriamycin and Cytoxan followed by Taxolcompleted 08/06/2020 4.Adjradiation7/26/2022-10/24/2020 5.Followed by adjuvant antiestrogen therapywith letrozole and abemaciclib ----------------------------------------------------------------------------------------------------------------------------- Treatment plan:Antiestrogen therapy withanastrozole 1 mg daily (started 11/15/2020), Verzenio  started 11/14/2020  (gradually escalating dosing)  Verzenio toxicities: 1.  Intermittent diarrhea 2. intermittent nausea: Being treated with Compazine 3.  Anemia: Hemoglobin  stable at 10 g  Since she had bilateral mastectomies there is no role of imaging studies. I would like to obtain CT chest abdomen pelvis and bone scan in July 2023. Patient will come back in 1 month to see John.  After that she could be seen every other month. I will see her back in 3 months.  She can alternate seeing Jenny Reichmann and myself subsequently.

## 2021-03-17 ENCOUNTER — Other Ambulatory Visit: Payer: Self-pay

## 2021-03-17 ENCOUNTER — Inpatient Hospital Stay: Payer: Commercial Managed Care - PPO | Admitting: Pharmacist

## 2021-03-17 ENCOUNTER — Inpatient Hospital Stay: Payer: Commercial Managed Care - PPO

## 2021-03-17 VITALS — BP 121/62 | HR 86 | Temp 97.7°F | Resp 16 | Ht 67.0 in | Wt 146.9 lb

## 2021-03-17 DIAGNOSIS — C50511 Malignant neoplasm of lower-outer quadrant of right female breast: Secondary | ICD-10-CM

## 2021-03-17 LAB — CMP (CANCER CENTER ONLY)
ALT: 26 U/L (ref 0–44)
AST: 18 U/L (ref 15–41)
Albumin: 4.3 g/dL (ref 3.5–5.0)
Alkaline Phosphatase: 63 U/L (ref 38–126)
Anion gap: 6 (ref 5–15)
BUN: 12 mg/dL (ref 8–23)
CO2: 31 mmol/L (ref 22–32)
Calcium: 9.9 mg/dL (ref 8.9–10.3)
Chloride: 103 mmol/L (ref 98–111)
Creatinine: 0.87 mg/dL (ref 0.44–1.00)
GFR, Estimated: >60 mL/min (ref >60)
Glucose, Bld: 102 mg/dL — ABNORMAL HIGH (ref 70–99)
Potassium: 3.9 mmol/L (ref 3.5–5.1)
Sodium: 140 mmol/L (ref 135–145)
Total Bilirubin: 0.4 mg/dL (ref 0.3–1.2)
Total Protein: 7.1 g/dL (ref 6.5–8.1)

## 2021-03-17 LAB — CBC WITH DIFFERENTIAL (CANCER CENTER ONLY)
Abs Immature Granulocytes: 0.02 10*3/uL (ref 0.00–0.07)
Basophils Absolute: 0.1 10*3/uL (ref 0.0–0.1)
Basophils Relative: 2 %
Eosinophils Absolute: 0.1 10*3/uL (ref 0.0–0.5)
Eosinophils Relative: 3 %
HCT: 28.8 % — ABNORMAL LOW (ref 36.0–46.0)
Hemoglobin: 10.3 g/dL — ABNORMAL LOW (ref 12.0–15.0)
Immature Granulocytes: 1 %
Lymphocytes Relative: 23 %
Lymphs Abs: 1 10*3/uL (ref 0.7–4.0)
MCH: 36.8 pg — ABNORMAL HIGH (ref 26.0–34.0)
MCHC: 35.8 g/dL (ref 30.0–36.0)
MCV: 102.9 fL — ABNORMAL HIGH (ref 80.0–100.0)
Monocytes Absolute: 0.3 10*3/uL (ref 0.1–1.0)
Monocytes Relative: 8 %
Neutro Abs: 2.6 10*3/uL (ref 1.7–7.7)
Neutrophils Relative %: 63 %
Platelet Count: 140 10*3/uL — ABNORMAL LOW (ref 150–400)
RBC: 2.8 MIL/uL — ABNORMAL LOW (ref 3.87–5.11)
RDW: 14.1 % (ref 11.5–15.5)
WBC Count: 4.2 10*3/uL (ref 4.0–10.5)
nRBC: 0 % (ref 0.0–0.2)

## 2021-03-17 NOTE — Progress Notes (Signed)
[name]       Telephone: [phone]?Fax: [phone]   Oncology Clinical Pharmacist Practitioner Progress Note  [name] was contacted via in-person to discuss her chemotherapy regimen for abemaciclib which they receive under the care of Dr. [name].  Current treatment regimen and start date Abemaciclib 150 mg by mouth every 12 hours (started on 12/10/20) 100 mg by mouth every 12 hours started on 11/27/20 50 mg by mouth every 12 hours (started on 11/13/20) Anastrozole 1 mg by mouth (started 11/15/20)  Interval History She continues on abemaciclib 150 mg by mouth  every 12 hours on days 1 to 28 of a 28-day cycle. This is being given  in combination with anastrozole . Therapy is planned to continue until   2 years in the adjuvant setting per the monarchE trial data .   Response to Therapy [name] is doing well.  Her last visit by clinical pharmacy was via a telephone visit on November 14 and she last saw Dr. Lindi Adie on January 3.  She is not reporting any side effects at this time.  When she initially went up to the full dose of abemaciclib in October, she was reporting some nausea and some loose stools.  But these side effects have now subsided.  We discussed the possibility of starting a bisphosphonate due to her last DEXA scan on December 22 showing osteopenia.  She does currently take calcium and vitamin D. We discussed that the current NCCN guidelines state to consider adjuvant bisphosphonate therapy for risk reduction of distant metastases for 3 to 5 years in postmenopausal patients with high risk node-negative or node positive tumors.  Her pathology from her bilateral mastectomies on 02/28/20 was ypT3, ypN2a.  [name] did state that she has an appointment with her dentist in early April, and we did discuss that it is recommended to have a dental exam prior to starting a bisphosphonate.  We will discuss the possibility of adding the bisphosphonate  with Dr. Lindi Adie and [name] said that she is willing to start the bisphosphonate if Dr. Lindi Adie feels that is reasonable.  Dr. Lindi Adie currently is planning on seeing [name] on April 3 with labs.  We discussed with [name] that if he feels that she should start the zoledronic acid sooner, that we will let her know so she can get in to see her dentist sooner if applicable.  She was in agreement with this plan and verbalized understanding.. Labs, vitals, treatment parameters, and manufacturer guidelines assessing toxicity were reviewed with [name] today.  Based on these values, patient is in agreement to continue therapy at this time.  Allergies No Known Allergies  Vitals Vitals with BMI 03/17/2021 02/17/2021 02/03/2021  Height 5\' 7"  5\' 7"  5\' 7"   Weight 146 lbs 14 oz 143 lbs 10 oz 145 lbs 6 oz  BMI 23 84.16 60.63  Systolic 016 010 932  Diastolic 62 55 58  Pulse 86 79 77     Laboratory Data CBC EXTENDED Latest Ref Rng & Units 03/17/2021 02/17/2021 02/03/2021  WBC 4.0 - 10.5 K/uL 4.2 3.9(L) 4.1  RBC 3.87 - 5.11 MIL/uL 2.80(L) 2.92(L) 2.96(L)  HGB 12.0 - 15.0 g/dL 10.3(L) 10.0(L) 10.0(L)  HCT 36.0 - 46.0 % 28.8(L) 28.7(L) 28.0(L)  PLT 150 - 400 K/uL 140(L) 142(L) 168  NEUTROABS 1.7 - 7.7 K/uL 2.6 2.3 2.6  LYMPHSABS 0.7 - 4.0 K/uL 1.0 1.1 0.9    CMP Latest Ref Rng &  Units 03/17/2021 02/17/2021 02/03/2021  Glucose 70 - 99 mg/dL 102(H) 95 108(H)  BUN 8 - 23 mg/dL 12 13 12   Creatinine 0.44 - 1.00 mg/dL 0.87 0.91 0.77  Sodium 135 - 145 mmol/L 140 140 140  Potassium 3.5 - 5.1 mmol/L 3.9 3.9 3.9  Chloride 98 - 111 mmol/L 103 105 106  CO2 22 - 32 mmol/L 31 30 28   Calcium 8.9 - 10.3 mg/dL 9.9 9.4 9.5  Total Protein 6.5 - 8.1 g/dL 7.1 7.0 6.8  Total Bilirubin 0.3 - 1.2 mg/dL 0.4 0.5 0.6  Alkaline Phos 38 - 126 U/L 63 71 69  AST 15 - 41 U/L 18 17 29   ALT 0 - 44 U/L 26 24 40    No results found for: MG  Adverse Effects Assessment Diarrhea: resolved Nausea: resolved Lack of  appetite: resolved  Adherence Assessment [name] reports missing 1 doses over the past 12 weeks.   Reason for missed dose: had surgery Patient was re-educated on importance of adherence.   Access Assessment [name] is currently receiving her abemaciclib through [facility] concerns:  none  Medication Reconciliation The patient's medication list was reviewed today with the patient? Yes New medications or herbal supplements have recently been started? No  Any medications have been discontinued? No  The medication list was updated and reconciled based on the patient's most recent medication list in the electronic medical record (EMR) including herbal products and OTC medications.   Medications Current Outpatient Medications  Medication Sig Dispense Refill   abemaciclib (VERZENIO) 150 MG tablet Take 1 tablet (150 mg total) by mouth 2 (two) times daily. Swallow tablets whole. Do not chew, crush, or split tablets before swallowing. 56 tablet 3   anastrozole (ARIMIDEX) 1 MG tablet Take 1 tablet (1 mg total) by mouth daily. 90 tablet 3   calcium carbonate (OS-CAL) 1250 (500 Ca) MG chewable tablet Chew 1 tablet by mouth daily.     cholecalciferol (VITAMIN D3) 25 MCG (1000 UNIT) tablet Take 1,000 Units by mouth daily.     levothyroxine (SYNTHROID) 125 MCG tablet Take 1 tablet (125 mcg total) by mouth daily before breakfast. (Patient taking differently: Take 100 mcg by mouth daily before breakfast. Pt is taking 100 mcg since Dec. 13,2022 per PCP)     loperamide (IMODIUM) 2 MG capsule Take by mouth as needed for diarrhea or loose stools. (Patient not taking: Reported on 11/20/2020)     Multiple Vitamins-Minerals (MULTIVITAMIN WITH MINERALS) tablet Take 1 tablet by mouth daily. (Patient not taking: Reported on 11/20/2020)     ondansetron (ZOFRAN) 8 MG tablet Take 1 tablet (8 mg total) by mouth every 8 (eight) hours as needed for nausea or vomiting.  (Patient not taking: Reported on 02/03/2021) 30 tablet 3   prochlorperazine (COMPAZINE) 10 MG tablet Take 1 tablet (10 mg total) by mouth every 6 (six) hours as needed for nausea or vomiting. (Patient not taking: Reported on 02/03/2021) 30 tablet 3   rosuvastatin (CRESTOR) 10 MG tablet Take 1 tablet (10 mg total) by mouth daily.     No current facility-administered medications for this visit.    Drug-Drug Interactions (DDIs) DDIs were evaluated? Yes Significant DDIs? No  The patient was instructed to speak with their health care provider and/or the oral chemotherapy pharmacist before starting any new drug, including prescription or over the counter, natural / herbal products, or vitamins.  Supportive Care Diarrhea: we reviewed that diarrhea is common with abemaciclib  and confirmed that she does have loperamide (Imodium) at home.  We reviewed how to take this medication PRN Neutropenia: we discussed the importance of having a thermometer and what the Centers for Disease Control and Prevention (CDC) considers a fever which is 100.53F (38C) or higher.  Gave patient 24/7 triage line to call if any fevers or symptoms ILD/Pneumonitis: we reviewed potential symptoms including cough, shortness, and fatigue.  Hepatotoxicity: WNL VTE: reviewed signs of DVT such as leg swelling, redness, pain, or tenderness and signs of PE such as shortness of breath, rapid or irregular heartbeat, cough, chest pain, or lightheadedness Reviewed to take the medication every 12 hours (with food sometimes can be easier on the stomach) and to take it at the same time every day.   Dosing Assessment Hepatic adjustments needed? No  Renal adjustments needed? No  Toxicity adjustments needed? No  The current dosing regimen is appropriate to continue at this time.  Follow-Up Plan Potentially start zoledronic acid every 6 months for risk reduction and osteopenia while on a aromatase inhibitor in the adjuvant setting. Will need  dental clearance prior Labs and visit with Dr. Lindi Adie on 05/18/21. Dr. Lindi Adie comfortable with alternating visits with clinical pharmacy every other month Labs / pharmacy clinic visit on 07/20/21 Can see Ms. Arutyunyan sooner if needed to start zoledronic acid or for any new symptoms  [name] participated in the discussion, expressed understanding, and voiced agreement with the above plan. All questions were answered to her satisfaction. The patient was advised to contact the clinic at [phone] with any questions or concerns prior to her return visit.   I spent 30 minutes assessing and educating the patient.  Raina Mina, RPH-CPP, 03/17/2021  3:14 PM   **Disclaimer: This note was dictated with voice recognition software. Similar sounding words can inadvertently be transcribed and this note may contain transcription errors which may not have been corrected upon publication of note.**

## 2021-03-20 ENCOUNTER — Other Ambulatory Visit (HOSPITAL_COMMUNITY): Payer: Self-pay

## 2021-03-26 ENCOUNTER — Other Ambulatory Visit (HOSPITAL_COMMUNITY): Payer: Self-pay

## 2021-04-14 ENCOUNTER — Other Ambulatory Visit (HOSPITAL_COMMUNITY): Payer: Self-pay

## 2021-04-15 ENCOUNTER — Other Ambulatory Visit (HOSPITAL_COMMUNITY): Payer: Self-pay

## 2021-04-17 ENCOUNTER — Other Ambulatory Visit (HOSPITAL_COMMUNITY): Payer: Self-pay

## 2021-04-20 ENCOUNTER — Other Ambulatory Visit (HOSPITAL_COMMUNITY): Payer: Self-pay

## 2021-04-21 ENCOUNTER — Other Ambulatory Visit (HOSPITAL_COMMUNITY): Payer: Self-pay

## 2021-04-21 ENCOUNTER — Encounter: Payer: Self-pay | Admitting: Hematology and Oncology

## 2021-04-21 ENCOUNTER — Telehealth: Payer: Self-pay | Admitting: Pharmacist

## 2021-04-21 MED ORDER — VALACYCLOVIR HCL 1 G PO TABS: 1000.0000 mg | ORAL_TABLET | Freq: Three times a day (TID) | ORAL | 0 refills | Status: AC

## 2021-04-21 NOTE — Telephone Encounter (Signed)
[facility]  ?     Telephone: [phone]?Fax: [phone]  ? ?Oncology Clinical Pharmacist Practitioner Progress Note ? ?[name] is a 66 y.o. female with a diagnosis of breast cancer currently on adjuvant abemaciclib under the care of Dr. [name]. She also receives anastrozole. Abemaciclib was started on 02/20/21. ? ?[name] contacted clinical pharmacy today.  Clinical pharmacy contacted the patient back via telephone.  She describes a itchy rash "in clusters" that starts at the bra line on her back and goes down.  She states that this started about a week ago.  She does not feel like the rash is getting any larger but it is also not improving.  We discussed the symptoms with Dr. Lindi Adie and he is under the clinical impression that this could be the start of a herpes zoster infection.  He has requested that the patient start valacyclovir 1000 mg 3 times daily for 7 days.  We discussed this with [name] and she is agreeable to the plan.  A prescription will be sent to her pharmacy of choice and she will start this medication upon receiving. ? ?[name] also describes lack of appetite and we recommended that she could consider Boost or Ensure supplement shakes to see if that may help.  Per her report, her weight at home is 136 pounds, which if accurate is about a 10 pound decrease since her last weight at our facility on 03/17/21.  She states that she is not nauseous or feeling sick, but rather having early satiety.  [name] says that she will report back how she feels regarding her appetite at her next visit with Dr. Lindi Adie which is scheduled for April 3.  At that point, if she is still having appetite issues, Dr. Lindi Adie may consider a dietitian consult. This was reviewed with [name] and she is agreeable to the plan.  ? ?[name] knows to contact the clinic should any new or worsening symptoms occur in the interim before her next visit with Dr. Lindi Adie. She  next sees clinical pharmacy tentatively on 07/20/21. ? ?Clinical pharmacy will continue to support [name] and Dr. [name] as needed. ? ?[name], RPH-CPP,  ?04/21/2021  3:01 PM  ? ?

## 2021-05-12 ENCOUNTER — Other Ambulatory Visit (HOSPITAL_COMMUNITY): Payer: Self-pay

## 2021-05-12 ENCOUNTER — Other Ambulatory Visit: Payer: Self-pay | Admitting: Hematology and Oncology

## 2021-05-13 ENCOUNTER — Other Ambulatory Visit (HOSPITAL_COMMUNITY): Payer: Self-pay

## 2021-05-13 ENCOUNTER — Encounter: Payer: Self-pay | Admitting: Hematology and Oncology

## 2021-05-13 MED ORDER — ABEMACICLIB 150 MG PO TABS
150.0000 mg | ORAL_TABLET | Freq: Two times a day (BID) | ORAL | 3 refills | Status: DC
Start: 2021-05-13 — End: 2021-05-18
  Filled 2021-05-13: qty 56, 28d supply, fill #0

## 2021-05-13 NOTE — Telephone Encounter (Signed)
Hi [name], can you please review and refill if needed. Thanks! ?

## 2021-05-15 ENCOUNTER — Other Ambulatory Visit (HOSPITAL_COMMUNITY): Payer: Self-pay

## 2021-05-15 NOTE — Progress Notes (Signed)
? ?Patient Care Team: ?Katelyn Pretty, MD as PCP - General (Internal Medicine) ?Katelyn Lose, MD as Consulting Physician (Hematology and Oncology) ?Katelyn Kussmaul, MD as Consulting Physician (General Surgery) ?Katelyn Rudd, MD as Consulting Physician (Radiation Oncology) ? ?DIAGNOSIS:  ?Encounter Diagnosis  ?Name Primary?  ? Malignant neoplasm of lower-outer quadrant of right breast of female, estrogen receptor positive ([name])   ? ? ?SUMMARY OF ONCOLOGIC HISTORY: ?Oncology History  ?Malignant neoplasm of lower-outer quadrant of right breast of female, estrogen receptor positive ([name])  ?08/01/2019 Initial Diagnosis  ? Palpable right breast mass for 1 month.  Mammogram revealed a 5 cm mass 8:30 position, 3 cm intramammary lymph node at 10 o'clock position and 2 enlarged right axillary lymph nodes.  Biopsy revealed grade 1 IDC ER 90%, PR 10%, Ki-67 10%, HER-2 negative.  Intramammary node and axillary lymph node both positive with similar prognostic profile ?  ?08/08/2019 -  Neo-Adjuvant Anti-estrogen oral therapy  ? Anastrozole while deciding treatment plan  ?  ?02/28/2020 Surgery  ? Bilateral mastectomies [name]):  ?Right breast: IDC, 6.5cm, metastatic carcinoma in 6/7 right axillary lymph nodes, 1.5cm, 1.5cm, and 0.3cm, and one intramammary lymph node, 2.5cm ?Left breast: no evidence of malignancy in the breast or 1 left axillary lymph node. ?  ?03/26/2020 - 08/06/2020 Chemotherapy  ? Dose dense Adriamycin and Cytoxan followed by Taxol x12 ?  ?07/31/2020 Cancer Staging  ? Staging form: Breast, AJCC 8th Edition ?- Pathologic stage from 07/31/2020: Stage IB (pT3, pN2a, cM0, G1, ER+, PR+, HER2-) - Signed by Katelyn Phlegm, NP on 02/03/2021 ?Histologic grading system: 3 grade system ? ?  ?09/09/2020 - 10/24/2020 Radiation Therapy  ? Adjuvant radiation ?  ?11/2020 -  Anti-estrogen oral therapy  ?  Antiestrogen therapy with anastrozole 1 mg daily (started 11/15/2020), Verzenio started 11/24/2020 (gradually escalating dose) ?   ? ? ?CHIEF COMPLIANT:  Follow-up of right breast cancer ? ?INTERVAL HISTORY: [name] is a  66 y.o. with above-mentioned history of  right breast cancer treated with neoadjuvant antiestrogen therapy, bilateral mastectomies, and adjuvant chemotherapy, currently on radiation therapy. She presents to the clinic today for follow-up. She state that with the [name], she had a changed  in her appetite,It's is better in the morning by the end of the day her appetite is not good.  ? ? ?ALLERGIES:  has No Known Allergies. ? ?MEDICATIONS:  ?Current Outpatient Medications  ?Medication Sig Dispense Refill  ? abemaciclib (VERZENIO) 100 MG tablet Take 1 tablet by mouth 2 times daily. Swallow tablets whole. Do not chew, crush, or split tablets before swallowing. 56 tablet 3  ? anastrozole (ARIMIDEX) 1 MG tablet Take 1 tablet (1 mg total) by mouth daily. 90 tablet 3  ? calcium carbonate (OS-CAL) 1250 (500 Ca) MG chewable tablet Chew 1 tablet by mouth daily.    ? cholecalciferol (VITAMIN D3) 25 MCG (1000 UNIT) tablet Take 1,000 Units by mouth daily.    ? levothyroxine (SYNTHROID) 125 MCG tablet Take 1 tablet (125 mcg total) by mouth daily before breakfast. (Patient taking differently: Take 100 mcg by mouth daily before breakfast. Pt is taking 100 mcg since Dec. 13,2022 per PCP)    ? loperamide (IMODIUM) 2 MG capsule Take by mouth as needed for diarrhea or loose stools. (Patient not taking: Reported on 11/20/2020)    ? Multiple Vitamins-Minerals (MULTIVITAMIN WITH MINERALS) tablet Take 1 tablet by mouth daily. (Patient not taking: Reported on 11/20/2020)    ? ondansetron (ZOFRAN) 8 MG tablet Take  1 tablet (8 mg total) by mouth every 8 (eight) hours as needed for nausea or vomiting. (Patient not taking: Reported on 02/03/2021) 30 tablet 3  ? prochlorperazine (COMPAZINE) 10 MG tablet Take 1 tablet (10 mg total) by mouth every 6 (six) hours as needed for nausea or vomiting. (Patient not taking: Reported on 02/03/2021) 30 tablet  3  ? rosuvastatin (CRESTOR) 10 MG tablet Take 1 tablet (10 mg total) by mouth daily.    ? ?No current facility-administered medications for this visit.  ? ? ?PHYSICAL EXAMINATION: ?ECOG PERFORMANCE STATUS: 1 - Symptomatic but completely ambulatory ? ?Vitals:  ? 05/18/21 1440  ?BP: 139/72  ?Pulse: 73  ?Resp: 16  ?Temp: (!) 97.5 ?F (36.4 ?C)  ?SpO2: 100%  ? ?Filed Weights  ? 05/18/21 1440  ?Weight: 138 lb 4.8 oz (62.7 kg)  ? ?  ? ?LABORATORY DATA:  ?I have reviewed the data as listed ? ?  Latest Ref Rng & Units 05/18/2021  ?  2:26 PM 03/17/2021  ?  2:33 PM 02/17/2021  ?  3:01 PM  ?CMP  ?Glucose 70 - 99 mg/dL 109   102   95    ?BUN 8 - 23 mg/dL '10   12   13    ' ?Creatinine 0.44 - 1.00 mg/dL 0.83   0.87   0.91    ?Sodium 135 - 145 mmol/L 140   140   140    ?Potassium 3.5 - 5.1 mmol/L 3.8   3.9   3.9    ?Chloride 98 - 111 mmol/L 105   103   105    ?CO2 22 - 32 mmol/L '28   31   30    ' ?Calcium 8.9 - 10.3 mg/dL 9.3   9.9   9.4    ?Total Protein 6.5 - 8.1 g/dL 6.8   7.1   7.0    ?Total Bilirubin 0.3 - 1.2 mg/dL 0.4   0.4   0.5    ?Alkaline Phos 38 - 126 U/L 57   63   71    ?AST 15 - 41 U/L 101   18   17    ?ALT 0 - 44 U/L 169   26   24    ? ? ?Lab Results  ?Component Value Date  ? WBC 3.9 (L) 05/18/2021  ? HGB 9.1 (L) 05/18/2021  ? HCT 25.4 (L) 05/18/2021  ? MCV 104.5 (H) 05/18/2021  ? PLT 142 (L) 05/18/2021  ? NEUTROABS 2.5 05/18/2021  ? ? ?ASSESSMENT & PLAN:  ?Malignant neoplasm of lower-outer quadrant of right breast of female, estrogen receptor positive ([place]) ?08/01/2019:Palpable right breast mass for 1 month.  Mammogram revealed a 5 cm mass 8:30 position, 3 cm intramammary lymph node at 10 o'clock position and 2 enlarged right axillary lymph nodes.  Biopsy revealed grade 1 IDC ER 90%, PR 10%, Ki-67 10%, HER-2 negative.  Intramammary node and axillary lymph node both positive with similar prognostic profile ?  ?Breast MRI 08/15/2019: Biopsy-proven malignancy lower outer quadrant right breast, non-mass enhancement spanning 8.7 cm  with probable invasion of underlying pectoral muscle.  Biopsy-proven metastatic lymph node in the right axillary tail.  Mild asymmetric right infraclavicular adenopathy.    Biopsy positive for invasive ductal carcinoma grade 1 ?  ?CT CAP 08/24/2019: Right axillary and right retropectoral adenopathy compatible with nodal metastases.  Indistinct sclerotic 1.2 cm left acetabular lesion.  No other distant metastases ? MammaPrint: Luminal type A, low risk, no benefit to chemotherapy ?Bone  scan: Negative for metastatic disease ?  ?Treatment Summary:  ?1. Neoadjuvant antiestrogen therapy with anastrozole 1 mg daily started 08/08/2019 ?2. bilateral mastectomies 02/28/2020: Right mastectomy with ALND: IDC 6.5 cm, 7/8 lymph nodes positive; left mastectomy: Benign ER 90%, PR 10%, Ki-67 10%, HER2 negative ?3. given the fact that the patient had 7 positive lymph nodes, she received dose dense Adriamycin and Cytoxan followed by Taxol completed 08/06/2020 ?4.  Adj radiation 09/09/2020-10/24/2020 ?5.  Followed by adjuvant antiestrogen therapy with letrozole and abemaciclib ?----------------------------------------------------------------------------------------------------------------------------- ?Treatment plan: Antiestrogen therapy with anastrozole 1 mg daily (started 11/15/2020), Verzenio  started 11/14/2020    ?  ?Verzenio toxicities: ?1.  Intermittent diarrhea ?2. profound decreased appetite and weight loss: Due to Verzenio: Reduce the dosage to 100 mg p.o. twice daily. ?3.  Anemia: Hemoglobin stable at 9 g  ?4.  Elevated LFTs: ? ?Based on all these factors we will reduce the dosage of Verzenio to 100 mg p.o. twice daily. ?  ?Since she had bilateral mastectomies there is no role of imaging studies. ?I would like to obtain CT chest abdomen pelvis and bone scan in July 2023 ?Return to clinic in 2 weeks with labs and follow-up ? ? ? ?No orders of the defined types were placed in this encounter. ? ?The patient has a good understanding of  the overall plan. she agrees with it. she will call with any problems that may develop before the next visit here. ?Total time spent: 30 mins including face to face time and time spent for planning, ch

## 2021-05-18 ENCOUNTER — Inpatient Hospital Stay (HOSPITAL_BASED_OUTPATIENT_CLINIC_OR_DEPARTMENT_OTHER): Payer: Commercial Managed Care - PPO | Admitting: Hematology and Oncology

## 2021-05-18 ENCOUNTER — Other Ambulatory Visit (HOSPITAL_COMMUNITY): Payer: Self-pay

## 2021-05-18 ENCOUNTER — Other Ambulatory Visit: Payer: Self-pay

## 2021-05-18 ENCOUNTER — Inpatient Hospital Stay: Payer: Commercial Managed Care - PPO | Attending: Hematology and Oncology

## 2021-05-18 DIAGNOSIS — Z79811 Long term (current) use of aromatase inhibitors: Secondary | ICD-10-CM | POA: Insufficient documentation

## 2021-05-18 DIAGNOSIS — C50511 Malignant neoplasm of lower-outer quadrant of right female breast: Secondary | ICD-10-CM | POA: Diagnosis present

## 2021-05-18 DIAGNOSIS — R7401 Elevation of levels of liver transaminase levels: Secondary | ICD-10-CM | POA: Insufficient documentation

## 2021-05-18 DIAGNOSIS — R630 Anorexia: Secondary | ICD-10-CM | POA: Insufficient documentation

## 2021-05-18 DIAGNOSIS — Z170 Estrogen receptor positive status [ER+]: Secondary | ICD-10-CM

## 2021-05-18 DIAGNOSIS — Z9013 Acquired absence of bilateral breasts and nipples: Secondary | ICD-10-CM | POA: Diagnosis not present

## 2021-05-18 DIAGNOSIS — D649 Anemia, unspecified: Secondary | ICD-10-CM | POA: Diagnosis not present

## 2021-05-18 DIAGNOSIS — Z79899 Other long term (current) drug therapy: Secondary | ICD-10-CM | POA: Diagnosis not present

## 2021-05-18 DIAGNOSIS — R634 Abnormal weight loss: Secondary | ICD-10-CM | POA: Insufficient documentation

## 2021-05-18 DIAGNOSIS — Z9221 Personal history of antineoplastic chemotherapy: Secondary | ICD-10-CM | POA: Diagnosis not present

## 2021-05-18 LAB — CBC WITH DIFFERENTIAL (CANCER CENTER ONLY)
Abs Immature Granulocytes: 0.01 10*3/uL (ref 0.00–0.07)
Basophils Absolute: 0.1 10*3/uL (ref 0.0–0.1)
Basophils Relative: 2 %
Eosinophils Absolute: 0.2 10*3/uL (ref 0.0–0.5)
Eosinophils Relative: 4 %
HCT: 25.4 % — ABNORMAL LOW (ref 36.0–46.0)
Hemoglobin: 9.1 g/dL — ABNORMAL LOW (ref 12.0–15.0)
Immature Granulocytes: 0 %
Lymphocytes Relative: 23 %
Lymphs Abs: 0.9 10*3/uL (ref 0.7–4.0)
MCH: 37.4 pg — ABNORMAL HIGH (ref 26.0–34.0)
MCHC: 35.8 g/dL (ref 30.0–36.0)
MCV: 104.5 fL — ABNORMAL HIGH (ref 80.0–100.0)
Monocytes Absolute: 0.3 10*3/uL (ref 0.1–1.0)
Monocytes Relative: 8 %
Neutro Abs: 2.5 10*3/uL (ref 1.7–7.7)
Neutrophils Relative %: 63 %
Platelet Count: 142 10*3/uL — ABNORMAL LOW (ref 150–400)
RBC: 2.43 MIL/uL — ABNORMAL LOW (ref 3.87–5.11)
RDW: 15.4 % (ref 11.5–15.5)
WBC Count: 3.9 10*3/uL — ABNORMAL LOW (ref 4.0–10.5)
nRBC: 0 % (ref 0.0–0.2)

## 2021-05-18 LAB — CMP (CANCER CENTER ONLY)
ALT: 169 U/L — ABNORMAL HIGH (ref 0–44)
AST: 101 U/L — ABNORMAL HIGH (ref 15–41)
Albumin: 3.8 g/dL (ref 3.5–5.0)
Alkaline Phosphatase: 57 U/L (ref 38–126)
Anion gap: 7 (ref 5–15)
BUN: 10 mg/dL (ref 8–23)
CO2: 28 mmol/L (ref 22–32)
Calcium: 9.3 mg/dL (ref 8.9–10.3)
Chloride: 105 mmol/L (ref 98–111)
Creatinine: 0.83 mg/dL (ref 0.44–1.00)
GFR, Estimated: >60 mL/min (ref >60)
Glucose, Bld: 109 mg/dL — ABNORMAL HIGH (ref 70–99)
Potassium: 3.8 mmol/L (ref 3.5–5.1)
Sodium: 140 mmol/L (ref 135–145)
Total Bilirubin: 0.4 mg/dL (ref 0.3–1.2)
Total Protein: 6.8 g/dL (ref 6.5–8.1)

## 2021-05-18 MED ORDER — ABEMACICLIB 100 MG PO TABS
100.0000 mg | ORAL_TABLET | Freq: Two times a day (BID) | ORAL | 3 refills | Status: DC
  Filled 2021-05-18: qty 70, 35d supply, fill #0
  Filled 2021-05-18: qty 56, 28d supply, fill #0
  Filled 2021-06-09: qty 56, 28d supply, fill #1
  Filled 2021-07-09: qty 56, 28d supply, fill #2
  Filled 2021-08-03: qty 56, 28d supply, fill #3

## 2021-05-18 NOTE — Assessment & Plan Note (Signed)
08/01/2019:Palpable right breast mass for 1 month. ?Mammogram revealed a 5 cm mass 8:30 position, 3 cm intramammary lymph node at 10 o'clock position and 2 enlarged right axillary lymph nodes. ?Biopsy revealed grade 1 IDC ER 90%, PR 10%, Ki-67 10%, HER-2 negative. ?Intramammary node and axillary lymph node both positive with similar prognostic profile ?? ?Breast MRI 08/15/2019: Biopsy-proven malignancy lower outer quadrant right breast, non-mass enhancement spanning 8.7 cm with probable invasion of underlying pectoral muscle. ?Biopsy-proven metastatic lymph node in the right axillary tail. ?Mild asymmetric right infraclavicular adenopathy.????Biopsy positive for invasive ductal carcinoma grade 1 ?? ?CT CAP 08/24/2019: Right axillary and right retropectoral adenopathy compatible with nodal metastases. ?Indistinct sclerotic 1.2 cm left acetabular lesion. ?No other distant metastases ??MammaPrint: Luminal type A, low risk, no benefit to chemotherapy ?Bone scan: Negative for metastatic disease ?? ?Treatment?Summary:? ?1.?Neoadjuvant antiestrogen therapy with anastrozole 1 mg daily started 08/08/2019 ?2.?bilateral mastectomies?02/28/2020: Right mastectomy with ALND: IDC 6.5 cm, 7/8 lymph nodes positive;?left mastectomy: Benign ER 90%, PR 10%, Ki-67 10%, HER2 negative ?3.?given the fact that the patient had 7 positive lymph nodes, she received?dose dense Adriamycin and Cytoxan followed by Taxol?completed 08/06/2020 ?4.??Adj?radiation?09/09/2020-10/24/2020 ?5.??Followed by adjuvant antiestrogen therapy?with letrozole and abemaciclib ?----------------------------------------------------------------------------------------------------------------------------- ?Treatment plan:?Antiestrogen therapy with?anastrozole 1 mg daily (started?11/15/2020), Verzenio? started 11/14/2020? (gradually escalating dosing) ?? ?Verzenio toxicities: ?1.  Intermittent diarrhea ?2. intermittent nausea: Being treated with Compazine ?3.  Anemia: Hemoglobin  stable at 10 g  ?? ?Since she had bilateral mastectomies there is no role of imaging studies. ?I would like to obtain CT chest abdomen pelvis and bone scan in July 2023 ?Return to clinic after scans in July. ?

## 2021-05-20 ENCOUNTER — Telehealth: Payer: Self-pay | Admitting: Dietician

## 2021-05-20 NOTE — Telephone Encounter (Signed)
Patient screened on MST. First attempt to reach. Provided my cell# on voice mail to return call to set up a nutrition consult. Sent text message with contact information as well. ? ?April Manson, RDN, LDN ?Registered Dietitian, [place] ?Part Time Remote (Usual office hours: Tuesday-Thursday) ?Cell: [phone]   ?

## 2021-05-21 ENCOUNTER — Other Ambulatory Visit (HOSPITAL_COMMUNITY): Payer: Self-pay

## 2021-05-25 NOTE — Progress Notes (Signed)
? ?Patient Care Team: ?Deland Pretty, MD as PCP - General (Internal Medicine) ?[name], MD as Consulting Physician (Hematology and Oncology) ?Jovita Kussmaul, MD as Consulting Physician (General Surgery) ?Kyung Rudd, MD as Consulting Physician (Radiation Oncology) ? ?DIAGNOSIS:  ?Encounter Diagnosis  ?Name Primary?  ? Malignant neoplasm of lower-outer quadrant of right breast of female, estrogen receptor positive ([place])   ? ? ?SUMMARY OF ONCOLOGIC HISTORY: ?Oncology History  ?Malignant neoplasm of lower-outer quadrant of right breast of female, estrogen receptor positive ([place])  ?08/01/2019 Initial Diagnosis  ? Palpable right breast mass for 1 month.  Mammogram revealed a 5 cm mass 8:30 position, 3 cm intramammary lymph node at 10 o'clock position and 2 enlarged right axillary lymph nodes.  Biopsy revealed grade 1 IDC ER 90%, PR 10%, Ki-67 10%, HER-2 negative.  Intramammary node and axillary lymph node both positive with similar prognostic profile ?  ?08/08/2019 -  Neo-Adjuvant Anti-estrogen oral therapy  ? Anastrozole while deciding treatment plan  ?  ?02/28/2020 Surgery  ? Bilateral mastectomies [name]):  ?Right breast: IDC, 6.5cm, metastatic carcinoma in 6/7 right axillary lymph nodes, 1.5cm, 1.5cm, and 0.3cm, and one intramammary lymph node, 2.5cm ?Left breast: no evidence of malignancy in the breast or 1 left axillary lymph node. ?  ?03/26/2020 - 08/06/2020 Chemotherapy  ? Dose dense Adriamycin and Cytoxan followed by Taxol x12 ?  ?07/31/2020 Cancer Staging  ? Staging form: Breast, AJCC 8th Edition ?- Pathologic stage from 07/31/2020: Stage IB (pT3, pN2a, cM0, G1, ER+, PR+, HER2-) - Signed by Gardenia Phlegm, NP on 02/03/2021 ?Histologic grading system: 3 grade system ? ?  ?09/09/2020 - 10/24/2020 Radiation Therapy  ? Adjuvant radiation ?  ?11/2020 -  Anti-estrogen oral therapy  ?  Antiestrogen therapy with anastrozole 1 mg daily (started 11/15/2020), Verzenio started 11/24/2020 (gradually escalating dose) ?   ? ? ?CHIEF COMPLIANT: Follow-up on Verzenio ? ?INTERVAL HISTORY: [name] is a   66 y.o. with above-mentioned history of  right breast cancer. She presents to the clinic today for labs and follow-up. She is tolerating the Verzenio. Denies fatigue appetite is doing good. Denies pain. Overall she states that she is doing good.  We reduced the dosage of Verzenio from 150 twice daily 200 twice daily and she has been doing significantly better.  Energy levels are back to normal.  Taste and appetite are fully normalized.  No diarrhea.  No nausea.  LFTs have also normalized. ? ? ?ALLERGIES:  has No Known Allergies. ? ?MEDICATIONS:  ?Current Outpatient Medications  ?Medication Sig Dispense Refill  ? abemaciclib (VERZENIO) 100 MG tablet Take 1 tablet by mouth 2 times daily. Swallow tablets whole. Do not chew, crush, or split tablets before swallowing. 56 tablet 3  ? anastrozole (ARIMIDEX) 1 MG tablet Take 1 tablet (1 mg total) by mouth daily. 90 tablet 3  ? calcium carbonate (OS-CAL) 1250 (500 Ca) MG chewable tablet Chew 1 tablet by mouth daily.    ? cholecalciferol (VITAMIN D3) 25 MCG (1000 UNIT) tablet Take 1,000 Units by mouth daily.    ? levothyroxine (SYNTHROID) 125 MCG tablet Take 1 tablet (125 mcg total) by mouth daily before breakfast. (Patient taking differently: Take 100 mcg by mouth daily before breakfast. Pt is taking 100 mcg since Dec. 13,2022 per PCP)    ? loperamide (IMODIUM) 2 MG capsule Take by mouth as needed for diarrhea or loose stools. (Patient not taking: Reported on 11/20/2020)    ? Multiple Vitamins-Minerals (MULTIVITAMIN WITH MINERALS) tablet  Take 1 tablet by mouth daily. (Patient not taking: Reported on 11/20/2020)    ? ondansetron (ZOFRAN) 8 MG tablet Take 1 tablet (8 mg total) by mouth every 8 (eight) hours as needed for nausea or vomiting. (Patient not taking: Reported on 02/03/2021) 30 tablet 3  ? prochlorperazine (COMPAZINE) 10 MG tablet Take 1 tablet (10 mg total) by mouth every 6 (six)  hours as needed for nausea or vomiting. (Patient not taking: Reported on 02/03/2021) 30 tablet 3  ? rosuvastatin (CRESTOR) 10 MG tablet Take 1 tablet (10 mg total) by mouth daily.    ? ?No current facility-administered medications for this visit.  ? ? ?PHYSICAL EXAMINATION: ?ECOG PERFORMANCE STATUS: 1 - Symptomatic but completely ambulatory ? ?Vitals:  ? 06/08/21 1458  ?BP: 126/65  ?Pulse: 84  ?Resp: 18  ?Temp: 97.7 ?F (36.5 ?C)  ?SpO2: 100%  ? ?Filed Weights  ? 06/08/21 1458  ?Weight: 140 lb 3.2 oz (63.6 kg)  ? ?  ? ?LABORATORY DATA:  ?I have reviewed the data as listed ? ?  Latest Ref Rng & Units 06/08/2021  ?  2:38 PM 05/18/2021  ?  2:26 PM 03/17/2021  ?  2:33 PM  ?CMP  ?Glucose 70 - 99 mg/dL 98   109   102    ?BUN 8 - 23 mg/dL _0 ?Creatinine 0.44 - 1.00 mg/dL 1.00   0.83   0.87    ?Sodium 135 - 145 mmol/L 139   140   140    ?Potassium 3.5 - 5.1 mmol/L 4.2   3.8   3.9    ?Chloride 98 - 111 mmol/L 103   105   103    ?CO2 22 - 32 mmol/L _1 ?Calcium 8.9 - 10.3 mg/dL 9.6   9.3   9.9    ?Total Protein 6.5 - 8.1 g/dL 7.1   6.8   7.1    ?Total Bilirubin 0.3 - 1.2 mg/dL 0.4   0.4   0.4    ?Alkaline Phos 38 - 126 U/L 64   57   63    ?AST 15 - 41 U/L 26   101   18    ?ALT 0 - 44 U/L 36   169   26    ? ? ?Lab Results  ?Component Value Date  ? WBC 4.4 06/08/2021  ? HGB 10.4 (L) 06/08/2021  ? HCT 29.8 (L) 06/08/2021  ? MCV 108.0 (H) 06/08/2021  ? PLT 204 06/08/2021  ? NEUTROABS 2.7 06/08/2021  ? ? ?ASSESSMENT & PLAN:  ?Malignant neoplasm of lower-outer quadrant of right breast of female, estrogen receptor positive ([place]) ?08/01/2019:Palpable right breast mass for 1 month.  Mammogram revealed a 5 cm mass 8:30 position, 3 cm intramammary lymph node at 10 o'clock position and 2 enlarged right axillary lymph nodes.  Biopsy revealed grade 1 IDC ER 90%, PR 10%, Ki-67 10%, HER-2 negative.  Intramammary node and axillary lymph node both positive with similar prognostic profile ?  ?Breast MRI 08/15/2019:  Biopsy-proven malignancy lower outer quadrant right breast, non-mass enhancement spanning 8.7 cm with probable invasion of underlying pectoral muscle.  Biopsy-proven metastatic lymph node in the right axillary tail.  Mild asymmetric right infraclavicular adenopathy.    Biopsy positive for invasive ductal carcinoma grade 1 ?  ?CT CAP 08/24/2019: Right axillary and right retropectoral adenopathy compatible with nodal metastases.  Indistinct sclerotic 1.2 cm left  acetabular lesion.  No other distant metastases ? MammaPrint: Luminal type A, low risk, no benefit to chemotherapy ?Bone scan: Negative for metastatic disease ?  ?Treatment Summary:  ?1. Neoadjuvant antiestrogen therapy with anastrozole 1 mg daily started 08/08/2019 ?2. bilateral mastectomies 02/28/2020: Right mastectomy with ALND: IDC 6.5 cm, 7/8 lymph nodes positive; left mastectomy: Benign ER 90%, PR 10%, Ki-67 10%, HER2 negative ?3. given the fact that the patient had 7 positive lymph nodes, she received dose dense Adriamycin and Cytoxan followed by Taxol completed 08/06/2020 ?4.  Adj radiation 09/09/2020-10/24/2020 ?5.  Followed by adjuvant antiestrogen therapy with letrozole and abemaciclib ?----------------------------------------------------------------------------------------------------------------------------- ?Treatment plan: Antiestrogen therapy with anastrozole 1 mg daily (started 11/15/2020), Verzenio  started 11/14/2020    ?  ?Verzenio toxicities: ?1.  Intermittent diarrhea: Resolved ?2. profound decreased appetite and weight loss: Due to Verzenio: Marked improvement with 100 mg p.o. twice daily ?3.  Anemia: Hemoglobin stable at 10.4 g  ?4.  Elevated LFTs: Monitoring closely LFTs have normalized. ?  ?Based on all these factors we will remain at 100 mg p.o. twice daily. ?  ?Since she had bilateral mastectomies there is no role of imaging studies. ?I would like to obtain CT chest abdomen pelvis and bone scan in July 2023 ?We can see her every 2 to 3  months ? ? ? ?Orders Placed This Encounter  ?Procedures  ? CBC with Differential ([place] Only)  ?  Standing Status:   Future  ?  Standing Expiration Date:   06/09/2022  ? CMP ([place] only)  ?  Standing [address]

## 2021-06-04 ENCOUNTER — Telehealth: Payer: Self-pay | Admitting: Dietician

## 2021-06-04 ENCOUNTER — Encounter (HOSPITAL_COMMUNITY): Payer: Self-pay

## 2021-06-04 NOTE — Telephone Encounter (Signed)
Nutrition Assessment ? ? ?Reason for Assessment: MST screen for weight loss.  ? ? ?ASSESSMENT: Patient is 66 year old female with malignant neoplasm of lower-outer quadrant of right breast of female, estrogen receptor positive. She is followed by Dr. [name], MD.  She has PMHX that includes thyroid cancer, hypothyroidism, and dyslipidemia. ? ?She reports feeling much better appetite has improved, fatigue has improved. She attributes weight loss to [employer]. Previously wasn't hungry at all in the late afternoon and for dinner meal.  Overall recent return to usual eating pattern has attention to whole foods with good intake of varied plant based foods.  ?Eating pattern and intake: ?Hasn't been enjoying eggs since chemo, but tries in an effort to keep up her protein intake she still eats them occassionally.  ? ?Always eating breakfast usually yogurt and fruit (banana), oatmeal with milk sometimes ?Lunch is usually leftovers ?Snacks mostly on fruit (banana, oranges ?Dinner last night was half chicken breast, squash casserole and salad. ?Usual meal pattern is varied meat and vegetable, pasta with vegetables, doesn't really eat much bread. ? ?Loves cooking and eats home cooked meals most often. ? ?Fluids: coffee (1 cup), hot tea, goal 50-60 water/day  sometimes wine on weekend, Gingerale for nausea, very occasional soda ? ?Takes a Vit D, Calcium, and Vit C, not taking MVI anymore ? ? ? ?Anthropometrics: weight loss 8.6# (5.9%) pat 90 days ? ?Height: 67" ?Weight:  ? ?05/18/21   138.3# ?03/17/21  146.9 ?02/03/21  145.4# ? ?UBW: 145-150 ?BMI: 21.66 ? ?INTERVENTION: Reviewed guidelines for healthy eating patterns. Encouraged weight stability. Provided guidance on calcium recommendations and absorption and suggested she change timing of calcium supplement to  evening meal. Emailed Nutrition Tips for Breast Cancer Survivors. ? ?Next Visit: PRN at patient or provider request ? ?April Manson, RDN, LDN ?Registered Dietitian,  [place] ?Part Time Remote (Usual office hours: Tuesday-Thursday) ?Cell: [phone]   ?

## 2021-06-05 ENCOUNTER — Other Ambulatory Visit (HOSPITAL_COMMUNITY): Payer: Self-pay

## 2021-06-08 ENCOUNTER — Inpatient Hospital Stay (HOSPITAL_BASED_OUTPATIENT_CLINIC_OR_DEPARTMENT_OTHER): Payer: Commercial Managed Care - PPO | Admitting: Hematology and Oncology

## 2021-06-08 ENCOUNTER — Inpatient Hospital Stay: Payer: Commercial Managed Care - PPO

## 2021-06-08 ENCOUNTER — Other Ambulatory Visit: Payer: Self-pay

## 2021-06-08 DIAGNOSIS — C50511 Malignant neoplasm of lower-outer quadrant of right female breast: Secondary | ICD-10-CM

## 2021-06-08 DIAGNOSIS — Z170 Estrogen receptor positive status [ER+]: Secondary | ICD-10-CM | POA: Diagnosis not present

## 2021-06-08 LAB — CBC WITH DIFFERENTIAL (CANCER CENTER ONLY)
Abs Immature Granulocytes: 0.01 10*3/uL (ref 0.00–0.07)
Basophils Absolute: 0.1 10*3/uL (ref 0.0–0.1)
Basophils Relative: 2 %
Eosinophils Absolute: 0.2 10*3/uL (ref 0.0–0.5)
Eosinophils Relative: 5 %
HCT: 29.8 % — ABNORMAL LOW (ref 36.0–46.0)
Hemoglobin: 10.4 g/dL — ABNORMAL LOW (ref 12.0–15.0)
Immature Granulocytes: 0 %
Lymphocytes Relative: 23 %
Lymphs Abs: 1 10*3/uL (ref 0.7–4.0)
MCH: 37.7 pg — ABNORMAL HIGH (ref 26.0–34.0)
MCHC: 34.9 g/dL (ref 30.0–36.0)
MCV: 108 fL — ABNORMAL HIGH (ref 80.0–100.0)
Monocytes Absolute: 0.3 10*3/uL (ref 0.1–1.0)
Monocytes Relative: 8 %
Neutro Abs: 2.7 10*3/uL (ref 1.7–7.7)
Neutrophils Relative %: 62 %
Platelet Count: 204 10*3/uL (ref 150–400)
RBC: 2.76 MIL/uL — ABNORMAL LOW (ref 3.87–5.11)
RDW: 13 % (ref 11.5–15.5)
WBC Count: 4.4 10*3/uL (ref 4.0–10.5)
nRBC: 0 % (ref 0.0–0.2)

## 2021-06-08 LAB — CMP (CANCER CENTER ONLY)
ALT: 36 U/L (ref 0–44)
AST: 26 U/L (ref 15–41)
Albumin: 4.3 g/dL (ref 3.5–5.0)
Alkaline Phosphatase: 64 U/L (ref 38–126)
Anion gap: 7 (ref 5–15)
BUN: 16 mg/dL (ref 8–23)
CO2: 29 mmol/L (ref 22–32)
Calcium: 9.6 mg/dL (ref 8.9–10.3)
Chloride: 103 mmol/L (ref 98–111)
Creatinine: 1 mg/dL (ref 0.44–1.00)
GFR, Estimated: >60 mL/min (ref >60)
Glucose, Bld: 98 mg/dL (ref 70–99)
Potassium: 4.2 mmol/L (ref 3.5–5.1)
Sodium: 139 mmol/L (ref 135–145)
Total Bilirubin: 0.4 mg/dL (ref 0.3–1.2)
Total Protein: 7.1 g/dL (ref 6.5–8.1)

## 2021-06-08 NOTE — Assessment & Plan Note (Signed)
08/01/2019:Palpable right breast mass for 1 month. ?Mammogram revealed a 5 cm mass 8:30 position, 3 cm intramammary lymph node at 10 o'clock position and 2 enlarged right axillary lymph nodes. ?Biopsy revealed grade 1 IDC ER 90%, PR 10%, Ki-67 10%, HER-2 negative. ?Intramammary node and axillary lymph node both positive with similar prognostic profile ?? ?Breast MRI 08/15/2019: Biopsy-proven malignancy lower outer quadrant right breast, non-mass enhancement spanning 8.7 cm with probable invasion of underlying pectoral muscle. ?Biopsy-proven metastatic lymph node in the right axillary tail. ?Mild asymmetric right infraclavicular adenopathy.????Biopsy positive for invasive ductal carcinoma grade 1 ?? ?CT CAP 08/24/2019: Right axillary and right retropectoral adenopathy compatible with nodal metastases. ?Indistinct sclerotic 1.2 cm left acetabular lesion. ?No other distant metastases ??MammaPrint: Luminal type A, low risk, no benefit to chemotherapy ?Bone scan: Negative for metastatic disease ?? ?Treatment?Summary:? ?1.?Neoadjuvant antiestrogen therapy with anastrozole 1 mg daily started 08/08/2019 ?2.?bilateral mastectomies?02/28/2020: Right mastectomy with ALND: IDC 6.5 cm, 7/8 lymph nodes positive;?left mastectomy: Benign ER 90%, PR 10%, Ki-67 10%, HER2 negative ?3.?given the fact that the patient had 7 positive lymph nodes, she received?dose dense Adriamycin and Cytoxan followed by Taxol?completed 08/06/2020 ?4.??Adj?radiation?09/09/2020-10/24/2020 ?5.??Followed by adjuvant antiestrogen therapy?with letrozole and abemaciclib ?----------------------------------------------------------------------------------------------------------------------------- ?Treatment plan:?Antiestrogen therapy with?anastrozole 1 mg daily (started?11/15/2020), Verzenio??started?11/14/2020??  ?? ?Verzenio toxicities: ?1.??Intermittent diarrhea ?2.?profound decreased appetite and weight loss: Due to Verzenio: Reduce the dosage to 100 mg p.o. twice  daily. ?3.??Anemia: Hemoglobin stable at 9 g  ?4.  Elevated LFTs: ?? ?Based on all these factors we will reduce the dosage of Verzenio to 100 mg p.o. twice daily. ?? ?Since she had bilateral mastectomies there is no role of imaging studies. ?I would like to obtain CT chest abdomen pelvis and bone scan in July 2023 ?Return to clinic in 2 weeks with labs and follow-up ?

## 2021-06-09 ENCOUNTER — Other Ambulatory Visit (HOSPITAL_COMMUNITY): Payer: Self-pay

## 2021-06-09 ENCOUNTER — Telehealth: Payer: Self-pay | Admitting: Hematology and Oncology

## 2021-06-09 ENCOUNTER — Encounter: Payer: Self-pay | Admitting: Hematology and Oncology

## 2021-06-09 ENCOUNTER — Other Ambulatory Visit: Payer: Self-pay | Admitting: *Deleted

## 2021-06-09 DIAGNOSIS — C50511 Malignant neoplasm of lower-outer quadrant of right female breast: Secondary | ICD-10-CM

## 2021-06-09 NOTE — Telephone Encounter (Signed)
Scheduled appointment per 4/24 los. Patient is aware. ?

## 2021-06-15 ENCOUNTER — Other Ambulatory Visit (HOSPITAL_COMMUNITY): Payer: Self-pay

## 2021-06-18 ENCOUNTER — Other Ambulatory Visit (HOSPITAL_COMMUNITY): Payer: Self-pay

## 2021-07-08 ENCOUNTER — Other Ambulatory Visit (HOSPITAL_COMMUNITY): Payer: Self-pay

## 2021-07-09 ENCOUNTER — Other Ambulatory Visit (HOSPITAL_COMMUNITY): Payer: Self-pay

## 2021-07-15 ENCOUNTER — Other Ambulatory Visit (HOSPITAL_COMMUNITY): Payer: Self-pay

## 2021-07-20 ENCOUNTER — Other Ambulatory Visit: Payer: Self-pay

## 2021-07-20 ENCOUNTER — Inpatient Hospital Stay: Payer: Commercial Managed Care - PPO | Admitting: Pharmacist

## 2021-07-20 ENCOUNTER — Inpatient Hospital Stay: Payer: Commercial Managed Care - PPO | Attending: Hematology and Oncology

## 2021-07-20 VITALS — BP 120/67 | HR 77 | Temp 97.9°F | Resp 16 | Ht 67.0 in | Wt 140.5 lb

## 2021-07-20 DIAGNOSIS — Z170 Estrogen receptor positive status [ER+]: Secondary | ICD-10-CM

## 2021-07-20 DIAGNOSIS — C50511 Malignant neoplasm of lower-outer quadrant of right female breast: Secondary | ICD-10-CM | POA: Insufficient documentation

## 2021-07-20 LAB — CBC WITH DIFFERENTIAL (CANCER CENTER ONLY)
Abs Immature Granulocytes: 0 10*3/uL (ref 0.00–0.07)
Basophils Absolute: 0.1 10*3/uL (ref 0.0–0.1)
Basophils Relative: 2 %
Eosinophils Absolute: 0.1 10*3/uL (ref 0.0–0.5)
Eosinophils Relative: 2 %
HCT: 33.2 % — ABNORMAL LOW (ref 36.0–46.0)
Hemoglobin: 11.7 g/dL — ABNORMAL LOW (ref 12.0–15.0)
Immature Granulocytes: 0 %
Lymphocytes Relative: 26 %
Lymphs Abs: 1.1 10*3/uL (ref 0.7–4.0)
MCH: 36.7 pg — ABNORMAL HIGH (ref 26.0–34.0)
MCHC: 35.2 g/dL (ref 30.0–36.0)
MCV: 104.1 fL — ABNORMAL HIGH (ref 80.0–100.0)
Monocytes Absolute: 0.4 10*3/uL (ref 0.1–1.0)
Monocytes Relative: 10 %
Neutro Abs: 2.6 10*3/uL (ref 1.7–7.7)
Neutrophils Relative %: 60 %
Platelet Count: 150 10*3/uL (ref 150–400)
RBC: 3.19 MIL/uL — ABNORMAL LOW (ref 3.87–5.11)
RDW: 11.5 % (ref 11.5–15.5)
WBC Count: 4.3 10*3/uL (ref 4.0–10.5)
nRBC: 0 % (ref 0.0–0.2)

## 2021-07-20 LAB — CMP (CANCER CENTER ONLY)
ALT: 53 U/L — ABNORMAL HIGH (ref 0–44)
AST: 27 U/L (ref 15–41)
Albumin: 4.4 g/dL (ref 3.5–5.0)
Alkaline Phosphatase: 70 U/L (ref 38–126)
Anion gap: 5 (ref 5–15)
BUN: 12 mg/dL (ref 8–23)
CO2: 30 mmol/L (ref 22–32)
Calcium: 9.9 mg/dL (ref 8.9–10.3)
Chloride: 105 mmol/L (ref 98–111)
Creatinine: 0.87 mg/dL (ref 0.44–1.00)
GFR, Estimated: >60 mL/min (ref >60)
Glucose, Bld: 92 mg/dL (ref 70–99)
Potassium: 3.8 mmol/L (ref 3.5–5.1)
Sodium: 140 mmol/L (ref 135–145)
Total Bilirubin: 0.4 mg/dL (ref 0.3–1.2)
Total Protein: 6.9 g/dL (ref 6.5–8.1)

## 2021-07-20 NOTE — Progress Notes (Signed)
[place]       Telephone: [phone]?Fax: [phone]   Oncology Clinical Pharmacist Practitioner Progress Note  [name] was contacted via in-person to discuss her chemotherapy regimen for abemaciclib which they receive under the care of Dr. [name].   Current treatment regimen and start date Abemaciclib 100 mg by mouth every 12 hours (started on 05/18/21)  150 mg by mouth every 12 hours (started on 12/10/20) 100 mg by mouth every 12 hours started on 11/27/20 50 mg by mouth every 12 hours (started on 11/13/20) Anastrozole 1 mg by mouth (started 11/15/20)   Interval History [name] continues on abemaciclib 100 mg by mouth  every 12 hours on days 1 to 28 of a 28-day cycle. This is being given  in combination with anastrozole . Therapy is planned to continue until 2 years in the adjuvant setting per the monarchE trial data .  Response to Therapy [name] was seen today by clinical pharmacy as a follow-up to her abemaciclib management.  [name] continues on the 100 mg every 12 hour dose which was prescribed by Dr. Lindi Adie in early April.  [name] was reduced from 150 mg mainly due to side effects such as fatigue and loss of appetite.  [name] continues to do very well on the 100 mg every 12 hours.  [name] is not having to take any antiemetics or antidiarrheals at this time.  [name] just started her new pack of abemaciclib today.  [name] will be traveling for 2 weeks starting on 08/15/21 going on an [place] cruise.  [name] will pick up her abemaciclib from the [facility] a couple of days prior to leaving.  [name] has a bone scan CT CAP on 09/04/21 and we will schedule a telephone encounter with Dr. Lindi Adie for that following week to review those images.  Since he continues to do well on abemaciclib we will next see her in September and then [name] will see Dr. Lindi Adie again with labs in December.  We can continue every 83-monthlabs and visits unless [name] starts to experience side  effects from the abemaciclib or request to be seen sooner.  [name] was in agreement with this plan. Labs, vitals, treatment parameters, and manufacturer guidelines assessing toxicity were reviewed with JQuillian Quincetoday. Based on these values, patient is in agreement to continue abemaciclib therapy at this time.  Allergies No Known Allergies  Vitals    07/20/2021    2:53 PM 06/08/2021    2:58 PM 05/18/2021    2:40 PM  Vitals with BMI  Height '5\' 7"'$  '5\' 7"'$  '5\' 7"'$   Weight 140 lbs 8 oz 140 lbs 3 oz 138 lbs 5 oz  BMI 22 [id number] Systolic [id number] Diastolic 67 65 72  Pulse 77 84 73     Laboratory Data    Latest Ref Rng & Units 07/20/2021    2:33 PM 06/08/2021    2:38 PM 05/18/2021    2:26 PM  CBC EXTENDED  WBC 4.0 - 10.5 K/uL 4.3   4.4   3.9    RBC 3.87 - 5.11 MIL/uL 3.19   2.76   2.43    Hemoglobin 12.0 - 15.0 g/dL 11.7   10.4   9.1    HCT 36.0 - 46.0 % 33.2   29.8   25.4    Platelets 150 - 400 K/uL 150   204   142    NEUT# 1.7 - 7.7  K/uL 2.6   2.7   2.5    Lymph# 0.7 - 4.0 K/uL 1.1   1.0   0.9         Latest Ref Rng & Units 07/20/2021    2:33 PM 06/08/2021    2:38 PM 05/18/2021    2:26 PM  CMP  Glucose 70 - 99 mg/dL 92   98   109    BUN 8 - 23 mg/dL '12   16   10    '$ Creatinine 0.44 - 1.00 mg/dL 0.87   1.00   0.83    Sodium 135 - 145 mmol/L 140   139   140    Potassium 3.5 - 5.1 mmol/L 3.8   4.2   3.8    Chloride 98 - 111 mmol/L 105   103   105    CO2 22 - 32 mmol/L '30   29   28    '$ Calcium 8.9 - 10.3 mg/dL 9.9   9.6   9.3    Total Protein 6.5 - 8.1 g/dL 6.9   7.1   6.8    Total Bilirubin 0.3 - 1.2 mg/dL 0.4   0.4   0.4    Alkaline Phos 38 - 126 U/L 70   64   57    AST 15 - 41 U/L 27   26   101    ALT 0 - 44 U/L 53   36   169      No results found for: MG  Adverse Effects Assessment Fatigue: Resolved since lowering dose of abemaciclib in April Loss of appetite: Resolved since lowering dose of abemaciclib in April  Adherence Assessment [name] reports missing 0  doses over the past 4 weeks.   Reason for missed dose: N/A Patient was re-educated on importance of adherence.   Access Assessment DAILIN SOSNOWSKI is currently receiving her abemaciclib through [facility] concerns: None  Medication Reconciliation The patient's medication list was reviewed today with the patient?  Yes New medications or herbal supplements have recently been started?  No Any medications have been discontinued?  No The medication list was updated and reconciled based on the patient's most recent medication list in the electronic medical record (EMR) including herbal products and OTC medications.   Medications Current Outpatient Medications  Medication Sig Dispense Refill   abemaciclib (VERZENIO) 100 MG tablet Take 1 tablet by mouth 2 times daily. Swallow tablets whole. Do not chew, crush, or split tablets before swallowing. 56 tablet 3   anastrozole (ARIMIDEX) 1 MG tablet Take 1 tablet (1 mg total) by mouth daily. 90 tablet 3   calcium carbonate (OS-CAL) 1250 (500 Ca) MG chewable tablet Chew 1 tablet by mouth daily.     cholecalciferol (VITAMIN D3) 25 MCG (1000 UNIT) tablet Take 1,000 Units by mouth daily.     levothyroxine (SYNTHROID) 125 MCG tablet Take 1 tablet (125 mcg total) by mouth daily before breakfast. (Patient taking differently: Take 100 mcg by mouth daily before breakfast. Pt is taking 100 mcg since Dec. 13,2022 per PCP)     rosuvastatin (CRESTOR) 10 MG tablet Take 1 tablet (10 mg total) by mouth daily.     loperamide (IMODIUM) 2 MG capsule Take by mouth as needed for diarrhea or loose stools. (Patient not taking: Reported on 11/20/2020)     Multiple Vitamins-Minerals (MULTIVITAMIN WITH MINERALS) tablet Take 1 tablet by mouth daily. (Patient not taking: Reported on 07/20/2021)  ondansetron (ZOFRAN) 8 MG tablet Take 1 tablet (8 mg total) by mouth every 8 (eight) hours as needed for nausea or vomiting. (Patient not taking: Reported on  02/03/2021) 30 tablet 3   prochlorperazine (COMPAZINE) 10 MG tablet Take 1 tablet (10 mg total) by mouth every 6 (six) hours as needed for nausea or vomiting. (Patient not taking: Reported on 02/03/2021) 30 tablet 3   No current facility-administered medications for this visit.    Drug-Drug Interactions (DDIs) DDIs were evaluated?  Yes Significant DDIs?  No The patient was instructed to speak with their health care provider and/or the oral chemotherapy pharmacist before starting any new drug, including prescription or over the counter, natural / herbal products, or vitamins.  Supportive Care Diarrhea: we reviewed that diarrhea is common with abemaciclib and confirmed that [name] does have loperamide (Imodium) at home.  We reviewed how to take this medication PRN Neutropenia: we discussed the importance of having a thermometer and what the Centers for Disease Control and Prevention (CDC) considers a fever which is 100.88F (38C) or higher.  Gave patient 24/7 triage line to call if any fevers or symptoms ILD/Pneumonitis: we reviewed potential symptoms including cough, shortness, and fatigue.  Hepatotoxicity: Within normal limits.  Was elevated at 1 visit likely due to the increased dose of abemaciclib.  Since reducing her dose her lab values have been normal. VTE: reviewed signs of DVT such as leg swelling, redness, pain, or tenderness and signs of PE such as shortness of breath, rapid or irregular heartbeat, cough, chest pain, or lightheadedness Reviewed to take the medication every 12 hours (with food sometimes can be easier on the stomach) and to take it at the same time every day.   Dosing Assessment Hepatic adjustments needed?  No Renal adjustments needed?  No Toxicity adjustments needed?  No The current dosing regimen is appropriate to continue at this time.  Follow-Up Plan Continue abemaciclib 100 mg every 12 hours and anastrozole daily Scans scheduled for 09/04/21 Dr. Lindi Adie may review  starting bisphosphonate to reduce breast cancer recurrence if feels it is reasonable. Discussed with Ms. Perreira again today. We will add telephone visit with Dr. Lindi Adie for the week of 09/07/21 to review scans We will add labs with pharmacy clinic visit in September and labs with Dr. Lindi Adie visit in December.  Ms. Tretter can continue every 16-monthlabs as long as [name] continues to tolerate abemaciclib.  [name] knows that [name] can always come into the clinic sooner if needed.  JQuillian Quinceparticipated in the discussion, expressed understanding, and voiced agreement with the above plan. All questions were answered to her satisfaction. The patient was advised to contact the clinic at [phone] with any questions or concerns prior to her return visit.   I spent 30 minutes assessing and educating the patient.  JRaina Mina RPH-CPP, 07/20/2021  3:15 PM   **Disclaimer: This note was dictated with voice recognition software. Similar sounding words can inadvertently be transcribed and this note may contain transcription errors which may not have been corrected upon publication of note.**

## 2021-08-03 ENCOUNTER — Other Ambulatory Visit (HOSPITAL_COMMUNITY): Payer: Self-pay

## 2021-08-10 ENCOUNTER — Other Ambulatory Visit (HOSPITAL_COMMUNITY): Payer: Self-pay

## 2021-08-17 ENCOUNTER — Ambulatory Visit (HOSPITAL_COMMUNITY): Payer: Commercial Managed Care - PPO

## 2021-08-17 ENCOUNTER — Other Ambulatory Visit (HOSPITAL_COMMUNITY): Payer: Commercial Managed Care - PPO

## 2021-09-02 ENCOUNTER — Other Ambulatory Visit (HOSPITAL_COMMUNITY): Payer: Self-pay

## 2021-09-03 ENCOUNTER — Encounter (HOSPITAL_COMMUNITY): Payer: Self-pay

## 2021-09-04 ENCOUNTER — Encounter (HOSPITAL_COMMUNITY)
Admission: RE | Admit: 2021-09-04 | Discharge: 2021-09-04 | Disposition: A | Discharge status: Home / Self Care | Payer: Commercial Managed Care - PPO | Source: Ambulatory Visit | Attending: Hematology and Oncology | Admitting: Hematology and Oncology

## 2021-09-04 ENCOUNTER — Other Ambulatory Visit (HOSPITAL_COMMUNITY): Payer: Self-pay

## 2021-09-04 ENCOUNTER — Ambulatory Visit (HOSPITAL_COMMUNITY)
Admission: RE | Admit: 2021-09-04 | Discharge: 2021-09-04 | Disposition: A | Discharge status: Home / Self Care | Payer: Commercial Managed Care - PPO | Source: Ambulatory Visit | Attending: Hematology and Oncology | Admitting: Hematology and Oncology

## 2021-09-04 ENCOUNTER — Other Ambulatory Visit: Payer: Self-pay | Admitting: Hematology and Oncology

## 2021-09-04 DIAGNOSIS — Z170 Estrogen receptor positive status [ER+]: Secondary | ICD-10-CM | POA: Diagnosis present

## 2021-09-04 DIAGNOSIS — C50511 Malignant neoplasm of lower-outer quadrant of right female breast: Secondary | ICD-10-CM | POA: Insufficient documentation

## 2021-09-04 MED ORDER — ABEMACICLIB 100 MG PO TABS
100.0000 mg | ORAL_TABLET | Freq: Two times a day (BID) | ORAL | 3 refills | Status: DC
  Filled 2021-09-04: qty 56, 28d supply, fill #0
  Filled 2021-10-01: qty 56, 28d supply, fill #1
  Filled 2021-11-02: qty 56, 28d supply, fill #2
  Filled 2021-12-03: qty 56, 28d supply, fill #3

## 2021-09-04 MED ORDER — IOHEXOL 300 MG/ML  SOLN
80.0000 mL | Freq: Once | INTRAMUSCULAR | Status: AC | PRN
  Administered 2021-09-04: 100 mL via INTRAVENOUS

## 2021-09-04 MED ORDER — TECHNETIUM TC 99M MEDRONATE IV KIT
21.8000 | PACK | Freq: Once | INTRAVENOUS | Status: AC
  Administered 2021-09-04: 21.8 via INTRAVENOUS

## 2021-09-04 NOTE — Progress Notes (Signed)
Patient did not answer her phone.  There was no ability to leave a message either. This encounter was created in error - please disregard.

## 2021-09-07 ENCOUNTER — Inpatient Hospital Stay: Payer: Commercial Managed Care - PPO | Attending: Hematology and Oncology | Admitting: Hematology and Oncology

## 2021-09-07 DIAGNOSIS — C50511 Malignant neoplasm of lower-outer quadrant of right female breast: Secondary | ICD-10-CM

## 2021-09-07 NOTE — Assessment & Plan Note (Signed)
08/01/2019:Palpable right breast mass for 1 month. Mammogram revealed a 5 cm mass 8:30 position, 3 cm intramammary lymph node at 10 o'clock position and 2 enlarged right axillary lymph nodes. Biopsy revealed grade 1 IDC ER 90%, PR 10%, Ki-67 10%, HER-2 negative. Intramammary node and axillary lymph node both positive with similar prognostic profile  Breast MRI 08/15/2019: Biopsy-proven malignancy lower outer quadrant right breast, non-mass enhancement spanning 8.7 cm with probable invasion of underlying pectoral muscle. Biopsy-proven metastatic lymph node in the right axillary tail. Mild asymmetric right infraclavicular adenopathy.Biopsy positive for invasive ductal carcinoma grade 1  CT CAP 08/24/2019: Right axillary and right retropectoral adenopathy compatible with nodal metastases. Indistinct sclerotic 1.2 cm left acetabular lesion. No other distant metastases MammaPrint: Luminal type A, low risk, no benefit to chemotherapy Bone scan: Negative for metastatic disease  TreatmentSummary: 1.Neoadjuvant antiestrogen therapy with anastrozole 1 mg daily started 08/08/2019 2.bilateral mastectomies1/13/2022: Right mastectomy with ALND: IDC 6.5 cm, 7/8 lymph nodes positive;left mastectomy: Benign ER 90%, PR 10%, Ki-67 10%, HER2 negative 3.given the fact that the patient had 7 positive lymph nodes, she receiveddose dense Adriamycin and Cytoxan followed by Taxolcompleted 08/06/2020 4.Adjradiation7/26/2022-10/24/2020 5.Followed by adjuvant antiestrogen therapywith letrozole and abemaciclib ----------------------------------------------------------------------------------------------------------------------------- Treatment plan:Antiestrogen therapy withanastrozole 1 mg daily (started10/02/2020), Verzeniostarted9/30/2022  Verzenio toxicities: 1.Intermittent diarrhea: Resolved 2.profound decreased appetite and weight loss: Due to Verzenio: Marked improvement with 100 mg  p.o. twice daily 3.Anemia: Hemoglobin stable at10.4g 4.Elevated LFTs: Monitoring closely LFTs have normalized.  Based on all these factors we will remain at 100 mg p.o. twice daily.  Since she had bilateral mastectomies there is no role of imaging studies.   CT CAP 09/04/2021: No definite evidence of metastatic disease.  Unchanged irregular sclerotic lesion left acetabulum (could be benign).  Radiation pneumonitis Bone scan 09/06/2021: No evidence of bone metastatic disease  Return to clinic in 3 months for labs and follow-up

## 2021-09-08 ENCOUNTER — Other Ambulatory Visit (HOSPITAL_COMMUNITY): Payer: Self-pay

## 2021-09-09 ENCOUNTER — Other Ambulatory Visit (HOSPITAL_COMMUNITY): Payer: Self-pay

## 2021-09-18 ENCOUNTER — Encounter: Payer: Self-pay | Admitting: Hematology and Oncology

## 2021-09-29 ENCOUNTER — Other Ambulatory Visit (HOSPITAL_COMMUNITY): Payer: Self-pay

## 2021-10-01 ENCOUNTER — Other Ambulatory Visit (HOSPITAL_COMMUNITY): Payer: Self-pay

## 2021-10-02 ENCOUNTER — Other Ambulatory Visit: Payer: Self-pay | Admitting: Obstetrics and Gynecology

## 2021-10-02 DIAGNOSIS — Z8249 Family history of ischemic heart disease and other diseases of the circulatory system: Secondary | ICD-10-CM

## 2021-10-05 ENCOUNTER — Telehealth: Payer: Self-pay | Admitting: Pharmacy Technician

## 2021-10-05 ENCOUNTER — Other Ambulatory Visit (HOSPITAL_COMMUNITY): Payer: Self-pay

## 2021-10-05 NOTE — Telephone Encounter (Signed)
Oral Oncology Patient Advocate Encounter  Prior Authorization for Melynda Keller has been approved.    PA# [id number] Effective dates: 10/05/2021 through 10/06/2022  Patient may continue to fill at [facility].    Lady Deutscher, CPhT-Adv Pharmacy Patient Advocate Specialist [place] Patient Advocate Team Direct Number: [phone]  Fax: [phone]

## 2021-10-05 NOTE — Telephone Encounter (Signed)
Oral Oncology Patient Advocate Encounter   Received notification that prior authorization for Verzenio is due for renewal.   PA submitted on 10/05/2021 Key [id number] Status is pending     Lady Deutscher, [place] Patient Advocate Specialist [place] Patient Advocate Team Direct Number: [phone]  Fax: [phone]

## 2021-10-08 ENCOUNTER — Other Ambulatory Visit (HOSPITAL_COMMUNITY): Payer: Self-pay

## 2021-10-13 ENCOUNTER — Other Ambulatory Visit (HOSPITAL_COMMUNITY): Payer: Self-pay

## 2021-10-26 ENCOUNTER — Other Ambulatory Visit: Payer: Self-pay

## 2021-10-26 ENCOUNTER — Inpatient Hospital Stay: Payer: Commercial Managed Care - PPO | Attending: Hematology and Oncology

## 2021-10-26 ENCOUNTER — Inpatient Hospital Stay: Payer: Commercial Managed Care - PPO | Admitting: Pharmacist

## 2021-10-26 ENCOUNTER — Ambulatory Visit: Payer: Commercial Managed Care - PPO | Admitting: Hematology and Oncology

## 2021-10-26 VITALS — BP 122/67 | HR 76 | Temp 97.5°F | Resp 16 | Ht 67.0 in | Wt 143.5 lb

## 2021-10-26 DIAGNOSIS — C773 Secondary and unspecified malignant neoplasm of axilla and upper limb lymph nodes: Secondary | ICD-10-CM | POA: Diagnosis not present

## 2021-10-26 DIAGNOSIS — Z170 Estrogen receptor positive status [ER+]: Secondary | ICD-10-CM

## 2021-10-26 DIAGNOSIS — C50511 Malignant neoplasm of lower-outer quadrant of right female breast: Secondary | ICD-10-CM | POA: Insufficient documentation

## 2021-10-26 LAB — CBC WITH DIFFERENTIAL (CANCER CENTER ONLY)
Abs Immature Granulocytes: 0.01 10*3/uL (ref 0.00–0.07)
Basophils Absolute: 0.1 10*3/uL (ref 0.0–0.1)
Basophils Relative: 1 %
Eosinophils Absolute: 0.1 10*3/uL (ref 0.0–0.5)
Eosinophils Relative: 1 %
HCT: 33.4 % — ABNORMAL LOW (ref 36.0–46.0)
Hemoglobin: 11.7 g/dL — ABNORMAL LOW (ref 12.0–15.0)
Immature Granulocytes: 0 %
Lymphocytes Relative: 24 %
Lymphs Abs: 1.4 10*3/uL (ref 0.7–4.0)
MCH: 35.5 pg — ABNORMAL HIGH (ref 26.0–34.0)
MCHC: 35 g/dL (ref 30.0–36.0)
MCV: 101.2 fL — ABNORMAL HIGH (ref 80.0–100.0)
Monocytes Absolute: 0.4 10*3/uL (ref 0.1–1.0)
Monocytes Relative: 7 %
Neutro Abs: 4 10*3/uL (ref 1.7–7.7)
Neutrophils Relative %: 67 %
Platelet Count: 174 10*3/uL (ref 150–400)
RBC: 3.3 MIL/uL — ABNORMAL LOW (ref 3.87–5.11)
RDW: 13.1 % (ref 11.5–15.5)
WBC Count: 5.9 10*3/uL (ref 4.0–10.5)
nRBC: 0 % (ref 0.0–0.2)

## 2021-10-26 LAB — CMP (CANCER CENTER ONLY)
ALT: 27 U/L (ref 0–44)
AST: 23 U/L (ref 15–41)
Albumin: 4.4 g/dL (ref 3.5–5.0)
Alkaline Phosphatase: 60 U/L (ref 38–126)
Anion gap: 4 — ABNORMAL LOW (ref 5–15)
BUN: 13 mg/dL (ref 8–23)
CO2: 32 mmol/L (ref 22–32)
Calcium: 9.8 mg/dL (ref 8.9–10.3)
Chloride: 103 mmol/L (ref 98–111)
Creatinine: 1.03 mg/dL — ABNORMAL HIGH (ref 0.44–1.00)
GFR, Estimated: >60 mL/min (ref >60)
Glucose, Bld: 138 mg/dL — ABNORMAL HIGH (ref 70–99)
Potassium: 3.8 mmol/L (ref 3.5–5.1)
Sodium: 139 mmol/L (ref 135–145)
Total Bilirubin: 0.4 mg/dL (ref 0.3–1.2)
Total Protein: 7.2 g/dL (ref 6.5–8.1)

## 2021-10-26 NOTE — Progress Notes (Signed)
[place]       Telephone: [phone]?Fax: [phone]   Oncology Clinical Pharmacist Practitioner Progress Note  [name] was contacted via in-person to discuss her chemotherapy regimen for abemaciclib which they receive under the care of Dr. [name].   Current treatment regimen and start date Abemaciclib 100 mg by mouth every 12 hours (started on 05/18/21)  150 mg by mouth every 12 hours (started on 12/10/20) 100 mg by mouth every 12 hours started on 11/27/20 50 mg by mouth every 12 hours (started on 11/13/20) Anastrozole 1 mg by mouth (started 11/15/20)   Interval History She continues on abemaciclib 100 mg by mouth  every 12 hours on days 1 to 28 of a 28-day cycle. This is being given  in combination with anastrozole . Therapy is planned to continue until 2 years in the adjuvant setting per the monarchE trial data.  [name] was seen today by clinical pharmacy as a follow-up to her abemaciclib management.  She was last seen by clinical pharmacy on 07/20/21 and Dr. Lindi Adie on 06/08/21.  She had bone scan and CT CAP on 09/04/21 which showed NED.  Response to Therapy [name] is doing well.  She reports no side effects at this time.  She continues on abemaciclib 100 mg every 12 hours and would like to continue on this dose.  She has not missed any doses of abemaciclib and has not had any changes in her medications.  Her creatinine today is slightly elevated and we discussed the importance of drinking plenty of fluids.  She next sees Dr. Lindi Adie with labs on 01/25/22 and she will see clinical pharmacy again in mid March 2024.  She knows that she can contact Dr. Geralyn Flash clinic sooner with any questions or concerns.  She will tentatively finish out her adjuvant abemaciclib on 11/14/22. Labs, vitals, treatment parameters, and manufacturer guidelines assessing toxicity were reviewed with [name] today. Based on these values, patient is in agreement to  continue abemaciclib therapy at this time.  Allergies No Known Allergies  Vitals    10/26/2021    3:00 PM 07/20/2021    2:53 PM 06/08/2021    2:58 PM  Vitals with BMI  Height '5\' 7"'$  '5\' 7"'$  '5\' 7"'$   Weight 143 lbs 8 oz 140 lbs 8 oz 140 lbs 3 oz  BMI 48.25 22 00.37  Systolic 048 889 169  Diastolic 67 67 65  Pulse 76 77 84   Temp Readings from Last 3 Encounters:  10/26/21 (!) 97.5 F (36.4 C) (Temporal)  07/20/21 97.9 F (36.6 C) (Tympanic)  06/08/21 97.7 F (36.5 C) (Temporal)    Laboratory Data    Latest Ref Rng & Units 10/26/2021    2:13 PM 07/20/2021    2:33 PM 06/08/2021    2:38 PM  CBC EXTENDED  WBC 4.0 - 10.5 K/uL 5.9  4.3  4.4   RBC 3.87 - 5.11 MIL/uL 3.30  3.19  2.76   Hemoglobin 12.0 - 15.0 g/dL 11.7  11.7  10.4   HCT 36.0 - 46.0 % 33.4  33.2  29.8   Platelets 150 - 400 K/uL 174  150  204   NEUT# 1.7 - 7.7 K/uL 4.0  2.6  2.7   Lymph# 0.7 - 4.0 K/uL 1.4  1.1  1.0        Latest Ref Rng & Units 10/26/2021    2:13 PM 07/20/2021    2:33 PM 06/08/2021  2:38 PM  CMP  Glucose 70 - 99 mg/dL 138  92  98   BUN 8 - 23 mg/dL '13  12  16   '$ Creatinine 0.44 - 1.00 mg/dL 1.03  0.87  1.00   Sodium 135 - 145 mmol/L 139  140  139   Potassium 3.5 - 5.1 mmol/L 3.8  3.8  4.2   Chloride 98 - 111 mmol/L 103  105  103   CO2 22 - 32 mmol/L 32  30  29   Calcium 8.9 - 10.3 mg/dL 9.8  9.9  9.6   Total Protein 6.5 - 8.1 g/dL 7.2  6.9  7.1   Total Bilirubin 0.3 - 1.2 mg/dL 0.4  0.4  0.4   Alkaline Phos 38 - 126 U/L 60  70  64   AST 15 - 41 U/L '23  27  26   '$ ALT 0 - 44 U/L 27  53  36     No results found for: "MG"  Adverse Effects Assessment No side effects reported at this time  Adherence Assessment [name] reports missing 0 doses over the past 8 weeks.   Reason for missed dose: N/A Patient was re-educated on importance of adherence.   Access Assessment [name] is currently receiving her abemaciclib through [employer] concerns:  None  Medication Reconciliation The patient's medication list was reviewed today with the patient?  Yes New medications or herbal supplements have recently been started?  No Any medications have been discontinued?  No The medication list was updated and reconciled based on the patient's most recent medication list in the electronic medical record (EMR) including herbal products and OTC medications.   Medications Current Outpatient Medications  Medication Sig Dispense Refill   abemaciclib (VERZENIO) 100 MG tablet Take 1 tablet by mouth 2 times daily. Swallow tablets whole. Do not chew, crush, or split tablets before swallowing. 56 tablet 3   anastrozole (ARIMIDEX) 1 MG tablet Take 1 tablet (1 mg total) by mouth daily. 90 tablet 3   calcium carbonate (OS-CAL) 1250 (500 Ca) MG chewable tablet Chew 1 tablet by mouth daily.     cholecalciferol (VITAMIN D3) 25 MCG (1000 UNIT) tablet Take 1,000 Units by mouth daily.     levothyroxine (SYNTHROID) 125 MCG tablet Take 1 tablet (125 mcg total) by mouth daily before breakfast. (Patient taking differently: Take 100 mcg by mouth daily before breakfast. Pt is taking 100 mcg since Dec. 13,2022 per PCP)     Multiple Vitamins-Minerals (MULTIVITAMIN WITH MINERALS) tablet Take 1 tablet by mouth daily.     rosuvastatin (CRESTOR) 10 MG tablet Take 1 tablet (10 mg total) by mouth daily.     loperamide (IMODIUM) 2 MG capsule Take by mouth as needed for diarrhea or loose stools. (Patient not taking: Reported on 11/20/2020)     ondansetron (ZOFRAN) 8 MG tablet Take 1 tablet (8 mg total) by mouth every 8 (eight) hours as needed for nausea or vomiting. (Patient not taking: Reported on 02/03/2021) 30 tablet 3   prochlorperazine (COMPAZINE) 10 MG tablet Take 1 tablet (10 mg total) by mouth every 6 (six) hours as needed for nausea or vomiting. (Patient not taking: Reported on 02/03/2021) 30 tablet 3   No current facility-administered medications for this visit.     Drug-Drug Interactions (DDIs) DDIs were evaluated?  Yes Significant DDIs?  No The patient was instructed to speak with their health care provider and/or the oral chemotherapy pharmacist before starting  any new drug, including prescription or over the counter, natural / herbal products, or vitamins.  Supportive Care ILD/Pneumonitis: we reviewed potential symptoms including cough, shortness, and fatigue.  VTE: reviewed signs of DVT such as leg swelling, redness, pain, or tenderness and signs of PE such as shortness of breath, rapid or irregular heartbeat, cough, chest pain, or lightheadedness  Dosing Assessment Hepatic adjustments needed?  No Renal adjustments needed?  No Toxicity adjustments needed?  No The current dosing regimen is appropriate to continue at this time.  Follow-Up Plan Continue abemaciclib 100 mg by mouth every 12 hours.  We will not be increasing the dose per patient request. Continue anastrozole 1 mg by mouth daily Labs, Dr. Lindi Adie visit, on 01/25/22 Labs, pharmacy clinic visit, in mid March 2024.  She will likely continue every 76-monthlabs with visits and finish out her adjuvant abemaciclib tentatively on 11/13/22  Katelyn Quinceparticipated in the discussion, expressed understanding, and voiced agreement with the above plan. All questions were answered to her satisfaction. The patient was advised to contact the clinic at [phone] with any questions or concerns prior to her return visit.   I spent 30 minutes assessing and educating the patient.  JRaina Mina RPH-CPP, 10/26/2021  4:24 PM   **Disclaimer: This note was dictated with voice recognition software. Similar sounding words can inadvertently be transcribed and this note may contain transcription errors which may not have been corrected upon publication of note.**

## 2021-10-27 ENCOUNTER — Other Ambulatory Visit (HOSPITAL_COMMUNITY): Payer: Self-pay

## 2021-10-29 ENCOUNTER — Other Ambulatory Visit (HOSPITAL_COMMUNITY): Payer: Self-pay

## 2021-11-02 ENCOUNTER — Other Ambulatory Visit (HOSPITAL_COMMUNITY): Payer: Self-pay

## 2021-11-09 ENCOUNTER — Other Ambulatory Visit (HOSPITAL_COMMUNITY): Payer: Self-pay

## 2021-11-23 ENCOUNTER — Other Ambulatory Visit: Payer: Self-pay | Admitting: Hematology and Oncology

## 2021-12-03 ENCOUNTER — Other Ambulatory Visit (HOSPITAL_COMMUNITY): Payer: Self-pay

## 2021-12-09 ENCOUNTER — Other Ambulatory Visit (HOSPITAL_COMMUNITY): Payer: Self-pay

## 2021-12-18 ENCOUNTER — Encounter: Payer: Self-pay | Admitting: Hematology and Oncology

## 2021-12-18 ENCOUNTER — Ambulatory Visit
Admission: RE | Admit: 2021-12-18 | Discharge: 2021-12-18 | Disposition: A | Discharge status: Home / Self Care | Payer: No Typology Code available for payment source | Source: Ambulatory Visit | Attending: Obstetrics and Gynecology | Admitting: Obstetrics and Gynecology

## 2021-12-18 DIAGNOSIS — Z8249 Family history of ischemic heart disease and other diseases of the circulatory system: Secondary | ICD-10-CM

## 2021-12-30 ENCOUNTER — Other Ambulatory Visit (HOSPITAL_COMMUNITY): Payer: Self-pay

## 2021-12-30 ENCOUNTER — Other Ambulatory Visit: Payer: Self-pay | Admitting: Hematology and Oncology

## 2021-12-30 MED ORDER — ABEMACICLIB 100 MG PO TABS
100.0000 mg | ORAL_TABLET | Freq: Two times a day (BID) | ORAL | 3 refills | Status: DC
  Filled 2021-12-30: qty 56, 28d supply, fill #0
  Filled 2022-02-01: qty 56, 28d supply, fill #1
  Filled 2022-03-02: qty 56, 28d supply, fill #2
  Filled 2022-03-31: qty 56, 28d supply, fill #3

## 2021-12-30 NOTE — Telephone Encounter (Signed)
HI John.  Please review and refill if needed. Thanks!

## 2022-01-05 ENCOUNTER — Other Ambulatory Visit (HOSPITAL_COMMUNITY): Payer: Self-pay

## 2022-01-22 ENCOUNTER — Other Ambulatory Visit: Payer: Self-pay | Admitting: *Deleted

## 2022-01-22 DIAGNOSIS — C50511 Malignant neoplasm of lower-outer quadrant of right female breast: Secondary | ICD-10-CM

## 2022-01-22 NOTE — Progress Notes (Signed)
Patient Care Team: Deland Pretty, MD as PCP - General (Internal Medicine) [name], MD as Consulting Physician (Hematology and Oncology) Jovita Kussmaul, MD as Consulting Physician (General Surgery) Kyung Rudd, MD as Consulting Physician (Radiation Oncology)  DIAGNOSIS: No diagnosis found.  SUMMARY OF ONCOLOGIC HISTORY: Oncology History  Malignant neoplasm of lower-outer quadrant of right breast of female, estrogen receptor positive ([place])  08/01/2019 Initial Diagnosis   Palpable right breast mass for 1 month.  Mammogram revealed a 5 cm mass 8:30 position, 3 cm intramammary lymph node at 10 o'clock position and 2 enlarged right axillary lymph nodes.  Biopsy revealed grade 1 IDC ER 90%, PR 10%, Ki-67 10%, HER-2 negative.  Intramammary node and axillary lymph node both positive with similar prognostic profile   08/08/2019 -  Neo-Adjuvant Anti-estrogen oral therapy   Anastrozole while deciding treatment plan    02/28/2020 Surgery   Bilateral mastectomies [name]):  Right breast: IDC, 6.5cm, metastatic carcinoma in 6/7 right axillary lymph nodes, 1.5cm, 1.5cm, and 0.3cm, and one intramammary lymph node, 2.5cm Left breast: no evidence of malignancy in the breast or 1 left axillary lymph node.   03/26/2020 - 08/06/2020 Chemotherapy   Dose dense Adriamycin and Cytoxan followed by Taxol x12   07/31/2020 Cancer Staging   Staging form: Breast, AJCC 8th Edition - Pathologic stage from 07/31/2020: Stage IB (pT3, pN2a, cM0, G1, ER+, PR+, HER2-) - Signed by Gardenia Phlegm, NP on 02/03/2021 Histologic grading system: 3 grade system   09/09/2020 - 10/24/2020 Radiation Therapy   Adjuvant radiation   11/2020 -  Anti-estrogen oral therapy    Antiestrogen therapy with anastrozole 1 mg daily (started 11/15/2020), Verzenio started 11/24/2020 (gradually escalating dose)     CHIEF COMPLIANT:   INTERVAL HISTORY: [name] is a   ALLERGIES:  has No Known Allergies.  MEDICATIONS:   Current Outpatient Medications  Medication Sig Dispense Refill   abemaciclib (VERZENIO) 100 MG tablet Take 1 tablet by mouth 2 times daily. Swallow tablets whole. Do not chew, crush, or split tablets before swallowing. 56 tablet 3   anastrozole (ARIMIDEX) 1 MG tablet Take 1 tablet (1 mg total) by mouth daily. 90 tablet 3   calcium carbonate (OS-CAL) 1250 (500 Ca) MG chewable tablet Chew 1 tablet by mouth daily.     cholecalciferol (VITAMIN D3) 25 MCG (1000 UNIT) tablet Take 1,000 Units by mouth daily.     levothyroxine (SYNTHROID) 125 MCG tablet Take 1 tablet (125 mcg total) by mouth daily before breakfast. (Patient taking differently: Take 100 mcg by mouth daily before breakfast. Pt is taking 100 mcg since Dec. 13,2022 per PCP)     loperamide (IMODIUM) 2 MG capsule Take by mouth as needed for diarrhea or loose stools. (Patient not taking: Reported on 11/20/2020)     Multiple Vitamins-Minerals (MULTIVITAMIN WITH MINERALS) tablet Take 1 tablet by mouth daily.     ondansetron (ZOFRAN) 8 MG tablet Take 1 tablet (8 mg total) by mouth every 8 (eight) hours as needed for nausea or vomiting. (Patient not taking: Reported on 02/03/2021) 30 tablet 3   prochlorperazine (COMPAZINE) 10 MG tablet Take 1 tablet (10 mg total) by mouth every 6 (six) hours as needed for nausea or vomiting. (Patient not taking: Reported on 02/03/2021) 30 tablet 3   rosuvastatin (CRESTOR) 10 MG tablet Take 1 tablet (10 mg total) by mouth daily.     No current facility-administered medications for this visit.    PHYSICAL EXAMINATION: ECOG PERFORMANCE STATUS: {CHL ONC ECOG  IY:[id number]}  There were no vitals filed for this visit. There were no vitals filed for this visit.  BREAST:*** No palpable masses or nodules in either right or left breasts. No palpable axillary supraclavicular or infraclavicular adenopathy no breast tenderness or nipple discharge. (exam performed in the presence of a chaperone)  LABORATORY DATA:  I have  reviewed the data as listed    Latest Ref Rng & Units 10/26/2021    2:13 PM 07/20/2021    2:33 PM 06/08/2021    2:38 PM  CMP  Glucose 70 - 99 mg/dL 138  92  98   BUN 8 - 23 mg/dL _0 Creatinine 0.44 - 1.00 mg/dL 1.03  0.87  1.00   Sodium 135 - 145 mmol/L 139  140  139   Potassium 3.5 - 5.1 mmol/L 3.8  3.8  4.2   Chloride 98 - 111 mmol/L 103  105  103   CO2 22 - 32 mmol/L 32  30  29   Calcium 8.9 - 10.3 mg/dL 9.8  9.9  9.6   Total Protein 6.5 - 8.1 g/dL 7.2  6.9  7.1   Total Bilirubin 0.3 - 1.2 mg/dL 0.4  0.4  0.4   Alkaline Phos 38 - 126 U/L 60  70  64   AST 15 - 41 U/L _1 ALT 0 - 44 U/L 27  53  36     Lab Results  Component Value Date   WBC 5.9 10/26/2021   HGB 11.7 (L) 10/26/2021   HCT 33.4 (L) 10/26/2021   MCV 101.2 (H) 10/26/2021   PLT 174 10/26/2021   NEUTROABS 4.0 10/26/2021    ASSESSMENT & PLAN:  No problem-specific Assessment & Plan notes found for this encounter.    No orders of the defined types were placed in this encounter.  The patient has a good understanding of the overall plan. she agrees with it. she will call with any problems that may develop before the next visit here. Total time spent: 30 mins including face to face time and time spent for planning, charting and co-ordination of care   Suzzette Righter, [place] 01/22/22 I, Gardiner Coins, am acting as a scribe for Dr. [name]  ***

## 2022-01-25 ENCOUNTER — Inpatient Hospital Stay (HOSPITAL_BASED_OUTPATIENT_CLINIC_OR_DEPARTMENT_OTHER): Payer: Commercial Managed Care - PPO | Admitting: Hematology and Oncology

## 2022-01-25 ENCOUNTER — Inpatient Hospital Stay: Payer: Commercial Managed Care - PPO | Attending: Hematology and Oncology

## 2022-01-25 VITALS — BP 123/71 | HR 72 | Temp 97.3°F | Resp 18 | Ht 67.0 in | Wt 147.7 lb

## 2022-01-25 DIAGNOSIS — C50511 Malignant neoplasm of lower-outer quadrant of right female breast: Secondary | ICD-10-CM | POA: Insufficient documentation

## 2022-01-25 DIAGNOSIS — C778 Secondary and unspecified malignant neoplasm of lymph nodes of multiple regions: Secondary | ICD-10-CM | POA: Diagnosis not present

## 2022-01-25 DIAGNOSIS — Z170 Estrogen receptor positive status [ER+]: Secondary | ICD-10-CM | POA: Insufficient documentation

## 2022-01-25 DIAGNOSIS — Z923 Personal history of irradiation: Secondary | ICD-10-CM | POA: Diagnosis not present

## 2022-01-25 DIAGNOSIS — Z9013 Acquired absence of bilateral breasts and nipples: Secondary | ICD-10-CM | POA: Insufficient documentation

## 2022-01-25 LAB — CMP (CANCER CENTER ONLY)
ALT: 20 U/L (ref 0–44)
AST: 19 U/L (ref 15–41)
Albumin: 4.3 g/dL (ref 3.5–5.0)
Alkaline Phosphatase: 65 U/L (ref 38–126)
Anion gap: 5 (ref 5–15)
BUN: 10 mg/dL (ref 8–23)
CO2: 30 mmol/L (ref 22–32)
Calcium: 10 mg/dL (ref 8.9–10.3)
Chloride: 105 mmol/L (ref 98–111)
Creatinine: 0.84 mg/dL (ref 0.44–1.00)
GFR, Estimated: >60 mL/min
Glucose, Bld: 95 mg/dL (ref 70–99)
Potassium: 4.1 mmol/L (ref 3.5–5.1)
Sodium: 140 mmol/L (ref 135–145)
Total Bilirubin: 0.4 mg/dL (ref 0.3–1.2)
Total Protein: 7 g/dL (ref 6.5–8.1)

## 2022-01-25 LAB — CBC WITH DIFFERENTIAL (CANCER CENTER ONLY)
Abs Immature Granulocytes: 0.01 10*3/uL (ref 0.00–0.07)
Basophils Absolute: 0.1 10*3/uL (ref 0.0–0.1)
Basophils Relative: 2 %
Eosinophils Absolute: 0.2 10*3/uL (ref 0.0–0.5)
Eosinophils Relative: 3 %
HCT: 33 % — ABNORMAL LOW (ref 36.0–46.0)
Hemoglobin: 11.7 g/dL — ABNORMAL LOW (ref 12.0–15.0)
Immature Granulocytes: 0 %
Lymphocytes Relative: 23 %
Lymphs Abs: 1.2 10*3/uL (ref 0.7–4.0)
MCH: 35.7 pg — ABNORMAL HIGH (ref 26.0–34.0)
MCHC: 35.5 g/dL (ref 30.0–36.0)
MCV: 100.6 fL — ABNORMAL HIGH (ref 80.0–100.0)
Monocytes Absolute: 0.4 10*3/uL (ref 0.1–1.0)
Monocytes Relative: 7 %
Neutro Abs: 3.4 10*3/uL (ref 1.7–7.7)
Neutrophils Relative %: 65 %
Platelet Count: 160 10*3/uL (ref 150–400)
RBC: 3.28 MIL/uL — ABNORMAL LOW (ref 3.87–5.11)
RDW: 12 % (ref 11.5–15.5)
WBC Count: 5.3 10*3/uL (ref 4.0–10.5)
nRBC: 0 % (ref 0.0–0.2)

## 2022-01-25 NOTE — Assessment & Plan Note (Addendum)
08/01/2019:Palpable right breast mass for 1 month.  Mammogram revealed a 5 cm mass 8:30 position, 3 cm intramammary lymph node at 10 o'clock position and 2 enlarged right axillary lymph nodes.  Biopsy revealed grade 1 IDC ER 90%, PR 10%, Ki-67 10%, HER-2 negative.  Intramammary node and axillary lymph node both positive with similar prognostic profile   Breast MRI 08/15/2019: Biopsy-proven malignancy lower outer quadrant right breast, non-mass enhancement spanning 8.7 cm with probable invasion of underlying pectoral muscle.  Biopsy-proven metastatic lymph node in the right axillary tail.  Mild asymmetric right infraclavicular adenopathy.    Biopsy positive for invasive ductal carcinoma grade 1   CT CAP 08/24/2019: Right axillary and right retropectoral adenopathy compatible with nodal metastases.  Indistinct sclerotic 1.2 cm left acetabular lesion.  No other distant metastases  MammaPrint: Luminal type A, low risk, no benefit to chemotherapy Bone scan: Negative for metastatic disease   Treatment Summary:  1. Neoadjuvant antiestrogen therapy with anastrozole 1 mg daily started 08/08/2019 2. bilateral mastectomies 02/28/2020: Right mastectomy with ALND: IDC 6.5 cm, 7/8 lymph nodes positive; left mastectomy: Benign ER 90%, PR 10%, Ki-67 10%, HER2 negative 3. given the fact that the patient had 7 positive lymph nodes, she received dose dense Adriamycin and Cytoxan followed by Taxol completed 08/06/2020 4.  Adj radiation 09/09/2020-10/24/2020 5.  Followed by adjuvant antiestrogen therapy with letrozole and abemaciclib ----------------------------------------------------------------------------------------------------------------------------- Treatment plan: Antiestrogen therapy with anastrozole 1 mg daily (started 11/15/2020), Verzenio  started 11/14/2020    Current dose: 100 p.o. twice daily   Verzenio toxicities: 1.  Intermittent diarrhea: Resolved 2. profound decreased appetite and weight loss: Due to  Verzenio: Marked improvement with 100 mg p.o. twice daily 3.  Anemia: Hemoglobin stable at 10.4 g  4.  Elevated LFTs: Monitoring closely LFTs have normalized.  Return to clinic every 3 months for labs and follow-up with Jenny Reichmann and I will see the patient back in September 2024 that will be the end of her Verzinio. Subsequently we will see her once a year  I recommended participation in [place] minimal residual disease testing.

## 2022-01-28 ENCOUNTER — Other Ambulatory Visit: Payer: Self-pay

## 2022-02-01 ENCOUNTER — Other Ambulatory Visit: Payer: Self-pay

## 2022-02-02 ENCOUNTER — Other Ambulatory Visit: Payer: Self-pay

## 2022-02-03 ENCOUNTER — Other Ambulatory Visit: Payer: Self-pay

## 2022-02-12 ENCOUNTER — Other Ambulatory Visit: Payer: Self-pay

## 2022-02-12 DIAGNOSIS — C50511 Malignant neoplasm of lower-outer quadrant of right female breast: Secondary | ICD-10-CM

## 2022-02-19 ENCOUNTER — Other Ambulatory Visit: Payer: Self-pay | Admitting: *Deleted

## 2022-02-19 DIAGNOSIS — C50511 Malignant neoplasm of lower-outer quadrant of right female breast: Secondary | ICD-10-CM

## 2022-02-19 NOTE — Progress Notes (Signed)
Per MD request [employer] request faxed to [phone].

## 2022-03-02 ENCOUNTER — Other Ambulatory Visit (HOSPITAL_COMMUNITY): Payer: Self-pay

## 2022-03-04 ENCOUNTER — Other Ambulatory Visit: Payer: Self-pay

## 2022-03-05 ENCOUNTER — Encounter: Payer: Self-pay | Admitting: Hematology and Oncology

## 2022-03-05 ENCOUNTER — Other Ambulatory Visit (HOSPITAL_COMMUNITY): Payer: Self-pay

## 2022-03-11 ENCOUNTER — Other Ambulatory Visit (HOSPITAL_COMMUNITY): Payer: Self-pay

## 2022-03-26 LAB — SIGNATERA ONLY (NATERA MANAGED)
SIGNATERA MTM READOUT: 0 MTM/ml
SIGNATERA TEST RESULT: NEGATIVE

## 2022-03-31 ENCOUNTER — Telehealth: Payer: Self-pay

## 2022-03-31 ENCOUNTER — Other Ambulatory Visit (HOSPITAL_COMMUNITY): Payer: Self-pay

## 2022-03-31 NOTE — Telephone Encounter (Signed)
Attempted to call pt regarding signatera results lvm for pt to return call back.   

## 2022-04-02 ENCOUNTER — Other Ambulatory Visit: Payer: Self-pay

## 2022-04-05 ENCOUNTER — Encounter (HOSPITAL_COMMUNITY): Payer: Self-pay

## 2022-04-05 ENCOUNTER — Telehealth: Payer: Self-pay

## 2022-04-05 NOTE — Telephone Encounter (Signed)
Called patient at [phone] for previst appt. With nurse and unable to reach anyone.  No answer.  No voice mail.  Called at 4:00and 4:04.

## 2022-04-06 NOTE — Telephone Encounter (Signed)
No call back received so show show letter sent

## 2022-04-17 ENCOUNTER — Other Ambulatory Visit (HOSPITAL_COMMUNITY): Payer: Self-pay

## 2022-04-20 ENCOUNTER — Encounter: Payer: Self-pay | Admitting: Internal Medicine

## 2022-04-27 ENCOUNTER — Other Ambulatory Visit (HOSPITAL_COMMUNITY): Payer: Self-pay

## 2022-04-27 ENCOUNTER — Other Ambulatory Visit: Payer: Self-pay | Admitting: Hematology and Oncology

## 2022-04-28 ENCOUNTER — Inpatient Hospital Stay: Payer: Commercial Managed Care - PPO | Admitting: Pharmacist

## 2022-04-28 ENCOUNTER — Other Ambulatory Visit (HOSPITAL_COMMUNITY): Payer: Self-pay

## 2022-04-28 ENCOUNTER — Other Ambulatory Visit: Payer: Self-pay

## 2022-04-28 ENCOUNTER — Inpatient Hospital Stay: Payer: Commercial Managed Care - PPO | Attending: Hematology and Oncology

## 2022-04-28 VITALS — BP 139/83 | HR 61 | Temp 97.9°F | Resp 18 | Ht 67.0 in | Wt 152.5 lb

## 2022-04-28 DIAGNOSIS — C50511 Malignant neoplasm of lower-outer quadrant of right female breast: Secondary | ICD-10-CM | POA: Diagnosis present

## 2022-04-28 DIAGNOSIS — Z170 Estrogen receptor positive status [ER+]: Secondary | ICD-10-CM | POA: Insufficient documentation

## 2022-04-28 DIAGNOSIS — C773 Secondary and unspecified malignant neoplasm of axilla and upper limb lymph nodes: Secondary | ICD-10-CM | POA: Insufficient documentation

## 2022-04-28 LAB — CBC WITH DIFFERENTIAL (CANCER CENTER ONLY)
Abs Immature Granulocytes: 0.02 10*3/uL (ref 0.00–0.07)
Basophils Absolute: 0.1 10*3/uL (ref 0.0–0.1)
Basophils Relative: 2 %
Eosinophils Absolute: 0.2 10*3/uL (ref 0.0–0.5)
Eosinophils Relative: 3 %
HCT: 31.3 % — ABNORMAL LOW (ref 36.0–46.0)
Hemoglobin: 11.4 g/dL — ABNORMAL LOW (ref 12.0–15.0)
Immature Granulocytes: 0 %
Lymphocytes Relative: 28 %
Lymphs Abs: 1.3 10*3/uL (ref 0.7–4.0)
MCH: 36.5 pg — ABNORMAL HIGH (ref 26.0–34.0)
MCHC: 36.4 g/dL — ABNORMAL HIGH (ref 30.0–36.0)
MCV: 100.3 fL — ABNORMAL HIGH (ref 80.0–100.0)
Monocytes Absolute: 0.4 10*3/uL (ref 0.1–1.0)
Monocytes Relative: 9 %
Neutro Abs: 2.7 10*3/uL (ref 1.7–7.7)
Neutrophils Relative %: 58 %
Platelet Count: 154 10*3/uL (ref 150–400)
RBC: 3.12 MIL/uL — ABNORMAL LOW (ref 3.87–5.11)
RDW: 12.4 % (ref 11.5–15.5)
WBC Count: 4.7 10*3/uL (ref 4.0–10.5)
nRBC: 0 % (ref 0.0–0.2)

## 2022-04-28 LAB — CMP (CANCER CENTER ONLY)
ALT: 26 U/L (ref 0–44)
AST: 21 U/L (ref 15–41)
Albumin: 4.2 g/dL (ref 3.5–5.0)
Alkaline Phosphatase: 63 U/L (ref 38–126)
Anion gap: 8 (ref 5–15)
BUN: 15 mg/dL (ref 8–23)
CO2: 25 mmol/L (ref 22–32)
Calcium: 8.9 mg/dL (ref 8.9–10.3)
Chloride: 105 mmol/L (ref 98–111)
Creatinine: 0.93 mg/dL (ref 0.44–1.00)
GFR, Estimated: >60 mL/min (ref >60)
Glucose, Bld: 146 mg/dL — ABNORMAL HIGH (ref 70–99)
Potassium: 3.7 mmol/L (ref 3.5–5.1)
Sodium: 138 mmol/L (ref 135–145)
Total Bilirubin: 0.3 mg/dL (ref 0.3–1.2)
Total Protein: 7 g/dL (ref 6.5–8.1)

## 2022-04-28 MED ORDER — ABEMACICLIB 100 MG PO TABS
100.0000 mg | ORAL_TABLET | Freq: Two times a day (BID) | ORAL | 6 refills | Status: DC
  Filled 2022-04-28: qty 56, 28d supply, fill #0
  Filled 2022-06-11: qty 56, 28d supply, fill #1
  Filled 2022-07-08 – 2022-07-17 (×2): qty 56, 28d supply, fill #2
  Filled 2022-08-02: qty 56, 28d supply, fill #3
  Filled 2022-09-07: qty 56, 28d supply, fill #4
  Filled 2022-10-05: qty 56, 28d supply, fill #5
  Filled 2022-11-03: qty 56, 28d supply, fill #6

## 2022-04-28 NOTE — Progress Notes (Signed)
[place]       Telephone: [phone]?Fax: [phone]   Oncology Clinical Pharmacist Practitioner Progress Note  [name] was contacted via in-person to discuss her chemotherapy regimen for abemaciclib which they receive under the care of Dr. [name].   Current treatment regimen and start date Abemaciclib 100 mg by mouth every 12 hours (started on 05/18/21)  == max dose per Dr. Lindi Adie 150 mg by mouth every 12 hours (started on 12/10/20) 100 mg by mouth every 12 hours started on 11/27/20 50 mg by mouth every 12 hours (started on 11/13/20)  Anastrozole 1 mg by mouth (started 11/15/20)   Interval History She continues on abemaciclib 100 mg by mouth  every 12 hours on days 1 to 28 of a 28-day cycle. This is being given  in combination with anastrozole . Therapy is planned to continue until 2 years in the adjuvant setting per the monarchE trial data.  [name] was seen today by clinical pharmacy as a follow-up to her abemaciclib management.  She was last seen by clinical pharmacy on 10/26/21 and Dr. Lindi Adie on 01/25/22. At that visit, he recommended being seen every 3 months and he will see her again in September 2024 when she is due to finish abemaciclib. Today we refilled her abemaciclib.  Response to Therapy [name] continues to tolerate abemaciclib very well at the 100 mg every 12 hour dose.  She is not reporting any side effects at this time.  Dr. Lindi Adie prefers for her to's be seen every 3 months with labs and so clinical pharmacy will see her again in June and then Dr. Lindi Adie will see her again in September when she is due to finish up with abemaciclib on 11/13/22.  She knows that she can contact Dr. Geralyn Flash clinic with any questions or concerns in the interim.. Labs, vitals, treatment parameters, and manufacturer guidelines assessing toxicity were reviewed with Quillian Quince today. Based on these values, patient is in agreement to continue abemaciclib  therapy at this time.  Allergies No Known Allergies  Vitals    04/28/2022    2:57 PM 01/25/2022    2:41 PM 10/26/2021    3:00 PM  Oncology Vitals  Height 170 cm 170 cm 170 cm  Weight 69.174 kg 66.996 kg 65.091 kg  Weight (lbs) 152 lbs 8 oz 147 lbs 11 oz 143 lbs 8 oz  BMI 23.88 kg/m2   23.88 kg/m2 23.13 kg/m2   23.13 kg/m2 22.48 kg/m2   22.48 kg/m2  Temp 97.9 F (36.6 C) 97.3 F (36.3 C) 97.5 F (36.4 C)  Pulse Rate 61 72 76  BP 139/83 123/71 122/67  Resp '18 18 16  '$ SpO2  99 % 100 %  BSA (m2) 1.81 m2   1.81 m2 1.78 m2   1.78 m2 1.75 m2   1.75 m2    Laboratory Data    Latest Ref Rng & Units 04/28/2022    2:18 PM 01/25/2022    2:26 PM 10/26/2021    2:13 PM  CBC EXTENDED  WBC 4.0 - 10.5 K/uL 4.7  5.3  5.9   RBC 3.87 - 5.11 MIL/uL 3.12  3.28  3.30   Hemoglobin 12.0 - 15.0 g/dL 11.4  11.7  11.7   HCT 36.0 - 46.0 % 31.3  33.0  33.4   Platelets 150 - 400 K/uL 154  160  174   NEUT# 1.7 - 7.7 K/uL 2.7  3.4  4.0   Lymph#  0.7 - 4.0 K/uL 1.3  1.2  1.4        Latest Ref Rng & Units 04/28/2022    2:18 PM 01/25/2022    2:26 PM 10/26/2021    2:13 PM  CMP  Glucose 70 - 99 mg/dL 146  95  138   BUN 8 - 23 mg/dL '15  10  13   '$ Creatinine 0.44 - 1.00 mg/dL 0.93  0.84  1.03   Sodium 135 - 145 mmol/L 138  140  139   Potassium 3.5 - 5.1 mmol/L 3.7  4.1  3.8   Chloride 98 - 111 mmol/L 105  105  103   CO2 22 - 32 mmol/L 25  30  32   Calcium 8.9 - 10.3 mg/dL 8.9  10.0  9.8   Total Protein 6.5 - 8.1 g/dL 7.0  7.0  7.2   Total Bilirubin 0.3 - 1.2 mg/dL 0.3  0.4  0.4   Alkaline Phos 38 - 126 U/L 63  65  60   AST 15 - 41 U/L '21  19  23   '$ ALT 0 - 44 U/L '26  20  27     '$ No results found for: "MG" No results found for: "CA2729"   Adverse Effects Assessment None reported at this time  Adherence Assessment [name] reports missing 0 doses over the past 12 weeks.   Reason for missed dose: n/a Patient was re-educated on importance of adherence.   Access Assessment TONYIA REICHLIN is currently receiving her abemaciclib through [facility] concerns:  none  Medication Reconciliation The patient's medication list was reviewed today with the patient? Yes New medications or herbal supplements have recently been started? No  Any medications have been discontinued? No  The medication list was updated and reconciled based on the patient's most recent medication list in the electronic medical record (EMR) including herbal products and OTC medications.   Medications Current Outpatient Medications  Medication Sig Dispense Refill   abemaciclib (VERZENIO) 100 MG tablet Take 1 tablet by mouth 2 times daily. Swallow tablets whole. Do not chew, crush, or split tablets before swallowing. 56 tablet 6   anastrozole (ARIMIDEX) 1 MG tablet Take 1 tablet (1 mg total) by mouth daily. 90 tablet 3   calcium carbonate (OS-CAL) 1250 (500 Ca) MG chewable tablet Chew 1 tablet by mouth daily.     cholecalciferol (VITAMIN D3) 25 MCG (1000 UNIT) tablet Take 1,000 Units by mouth daily.     levothyroxine (SYNTHROID) 125 MCG tablet Take 1 tablet (125 mcg total) by mouth daily before breakfast. (Patient taking differently: Take 100 mcg by mouth daily before breakfast. Pt is taking 100 mcg since Dec. 13,2022 per PCP)     loperamide (IMODIUM) 2 MG capsule Take by mouth as needed for diarrhea or loose stools. (Patient not taking: Reported on 11/20/2020)     Multiple Vitamins-Minerals (MULTIVITAMIN WITH MINERALS) tablet Take 1 tablet by mouth daily.     ondansetron (ZOFRAN) 8 MG tablet Take 1 tablet (8 mg total) by mouth every 8 (eight) hours as needed for nausea or vomiting. (Patient not taking: Reported on 02/03/2021) 30 tablet 3   prochlorperazine (COMPAZINE) 10 MG tablet Take 1 tablet (10 mg total) by mouth every 6 (six) hours as needed for nausea or vomiting. (Patient not taking: Reported on 02/03/2021) 30 tablet 3   rosuvastatin (CRESTOR) 10 MG tablet Take 1 tablet  (10 mg total) by mouth daily.  No current facility-administered medications for this visit.    Drug-Drug Interactions (DDIs) DDIs were evaluated? Yes Significant DDIs? No  The patient was instructed to speak with their health care provider and/or the oral chemotherapy pharmacist before starting any new drug, including prescription or over the counter, natural / herbal products, or vitamins.  Supportive Care Diarrhea: we reviewed that diarrhea is common with abemaciclib and confirmed that she does have loperamide (Imodium) at home.  We reviewed how to take this medication PRN. Neutropenia: we discussed the importance of having a thermometer and what the Centers for Disease Control and Prevention (CDC) considers a fever which is 100.41F (38C) or higher.  Gave patient 24/7 triage line to call if any fevers or symptoms. ILD/Pneumonitis: we reviewed potential symptoms including cough, shortness, and fatigue.  VTE: reviewed signs of DVT such as leg swelling, redness, pain, or tenderness and signs of PE such as shortness of breath, rapid or irregular heartbeat, cough, chest pain, or lightheadedness. Reviewed to take the medication every 12 hours (with food sometimes can be easier on the stomach) and to take it at the same time every day. Hepatotoxicity: WNL  Dosing Assessment Hepatic adjustments needed? Yes  Renal adjustments needed? No  Toxicity adjustments needed? No  The current dosing regimen is appropriate to continue at this time.  Follow-Up Plan Continue abemaciclib 100 mg by mouth every 12 hours.  We will not be increasing the dose per patient request. Continue anastrozole 1 mg by mouth daily Labs, Dr. Lindi Adie visit, week of 11/08/22. Due to finish abemaciclib on 11/13/22 Labs, pharmacy clinic visit, in 3 months.   Next due for DEXA scan on 02/06/23  Quillian Quince participated in the discussion, expressed understanding, and voiced agreement with the above plan. All questions were  answered to her satisfaction. The patient was advised to contact the clinic at [phone] with any questions or concerns prior to her return visit.   I spent 30 minutes assessing and educating the patient.  Raina Mina, RPH-CPP, 04/28/2022  3:22 PM   **Disclaimer: This note was dictated with voice recognition software. Similar sounding words can inadvertently be transcribed and this note may contain transcription errors which may not have been corrected upon publication of note.**

## 2022-04-28 NOTE — Telephone Encounter (Signed)
John, you will see this pt today.  Can you please refill. Thanks!

## 2022-04-29 ENCOUNTER — Telehealth: Payer: Self-pay | Admitting: Pharmacist

## 2022-04-29 NOTE — Telephone Encounter (Signed)
Scheduled appointments per 3/13 los. Patient is aware of the made appointments.

## 2022-04-30 ENCOUNTER — Other Ambulatory Visit: Payer: Self-pay

## 2022-05-06 ENCOUNTER — Encounter: Payer: Commercial Managed Care - PPO | Admitting: Internal Medicine

## 2022-05-14 ENCOUNTER — Other Ambulatory Visit (HOSPITAL_COMMUNITY): Payer: Self-pay

## 2022-05-23 ENCOUNTER — Encounter: Payer: Self-pay | Admitting: Hematology and Oncology

## 2022-05-25 ENCOUNTER — Other Ambulatory Visit: Payer: Self-pay

## 2022-05-25 ENCOUNTER — Inpatient Hospital Stay: Payer: Commercial Managed Care - PPO | Attending: Hematology and Oncology | Admitting: Physician Assistant

## 2022-05-25 VITALS — BP 144/77 | HR 70 | Temp 97.5°F | Resp 15 | Wt 153.1 lb

## 2022-05-25 DIAGNOSIS — C50511 Malignant neoplasm of lower-outer quadrant of right female breast: Secondary | ICD-10-CM | POA: Insufficient documentation

## 2022-05-25 DIAGNOSIS — Z170 Estrogen receptor positive status [ER+]: Secondary | ICD-10-CM | POA: Insufficient documentation

## 2022-05-25 DIAGNOSIS — Z79899 Other long term (current) drug therapy: Secondary | ICD-10-CM | POA: Insufficient documentation

## 2022-05-25 DIAGNOSIS — R21 Rash and other nonspecific skin eruption: Secondary | ICD-10-CM | POA: Insufficient documentation

## 2022-05-25 MED ORDER — TRIAMCINOLONE ACETONIDE 0.5 % EX OINT: 1.0000 | TOPICAL_OINTMENT | Freq: Two times a day (BID) | CUTANEOUS | 0 refills | Status: DC | PRN

## 2022-05-25 NOTE — Progress Notes (Signed)
Symptom Management Consult Note [facility]    Patient Care Team: Merri Brunette, MD as PCP - General (Internal Medicine) Serena Croissant, MD as Consulting Physician (Hematology and Oncology) Griselda Miner, MD as Consulting Physician (General Surgery) Dorothy Puffer, MD as Consulting Physician (Radiation Oncology)    Name / MRN / DOB: [name]  [id number]  Jul 02, 1955   Date of visit: 05/25/2022   Chief Complaint/Reason for visit: rash   Current Therapy: Verzenio and Anastrazole   ASSESSMENT & PLAN: Patient is a 67 y.o. female  with oncologic history of malignant neoplasm of lower-outer quadrant of right breast of female, estrogen receptor positive  followed by Dr. Pamelia Hoit.  I have viewed most recent oncology note and lab work.    #Malignant neoplasm of lower-outer quadrant of right breast of female, estrogen receptor positive  - Next appointment with pharmacist is 07/21/2022   #Rash -Has been on verzenio since 10/2020 and Anastrozole since 11/2019 - As rash is localized will prescribe Kenalog ointment. Patient instructed to to apply ointment to face as it can cause skin atrophy. -Encouraged to start OTC zyrtec as well. - Discussed rash with Dr. Johnathan Hausen who agrees with plan to hold Anastrozole until next oncology appointment. - Patient encouraged to RTC if symptoms do not improve.     Heme/Onc History: Oncology History  Malignant neoplasm of lower-outer quadrant of right breast of female, estrogen receptor positive  08/01/2019 Initial Diagnosis   Palpable right breast mass for 1 month.  Mammogram revealed a 5 cm mass 8:30 position, 3 cm intramammary lymph node at 10 o'clock position and 2 enlarged right axillary lymph nodes.  Biopsy revealed grade 1 IDC ER 90%, PR 10%, Ki-67 10%, HER-2 negative.  Intramammary node and axillary lymph node both positive with similar prognostic profile   08/08/2019 -  Neo-Adjuvant Anti-estrogen oral therapy   Anastrozole  while deciding treatment plan    02/28/2020 Surgery   Bilateral mastectomies Carolynne Edouard):  Right breast: IDC, 6.5cm, metastatic carcinoma in 6/7 right axillary lymph nodes, 1.5cm, 1.5cm, and 0.3cm, and one intramammary lymph node, 2.5cm Left breast: no evidence of malignancy in the breast or 1 left axillary lymph node.   03/26/2020 - 08/06/2020 Chemotherapy   Dose dense Adriamycin and Cytoxan followed by Taxol x12   07/31/2020 Cancer Staging   Staging form: Breast, AJCC 8th Edition - Pathologic stage from 07/31/2020: Stage IB (pT3, pN2a, cM0, G1, ER+, PR+, HER2-) - Signed by Loa Socks, NP on 02/03/2021 Histologic grading system: 3 grade system   09/09/2020 - 10/24/2020 Radiation Therapy   Adjuvant radiation   11/2020 -  Anti-estrogen oral therapy    Antiestrogen therapy with anastrozole 1 mg daily (started 11/15/2020), Verzenio started 11/24/2020 (gradually escalating dose)       Interval history-: [name] is a 67 y.o. female with oncologic history as above presenting to [facility] today with chief complaint of rash x approximately 2 weeks. She presents unaccompanied to clinic today.  Patient states the rash started on her right shoulder and itched.  Rash then spread to her mid back and waistline.  She denies any new medications.  No one in the home has similar rash.  She has tried moisturizing the rash with Cetaphil and Lubriderm lotions which gives her brief itching relief.  She has not tried any over-the-counter medications.  She denies history of similar rash.  She has not had any prolonged sun exposure. She denies any fever, chills, joint pain, fatigue,  diarrhea.     ROS  All other systems are reviewed and are negative for acute change except as noted in the HPI.    No Known Allergies   Past Medical History:  Diagnosis Date   Complication of anesthesia    Dyslipidemia    Hypothyroidism    Pneumonia    PONV (postoperative nausea and vomiting)    Thyroid cancer  (HCC)      Past Surgical History:  Procedure Laterality Date   MASTECTOMY WITH AXILLARY LYMPH NODE DISSECTION Bilateral 02/28/2020   Procedure: BILATERAL MASTECTOMY WITH RIGHT SENTINEL LYMPH NODE MAPPING AND RIGHT TARGETED RADIOACTIVE SEED GUIDED  LYMPH NODE DISSECTION;  Surgeon: Griselda Mineroth, Paul III, MD;  Location: [facility] OR;  Service: General;  Laterality: Bilateral;  PEC BLOCK, RNFA (SHARON HITCHCOCK)   PORT-A-CATH REMOVAL Left 10/13/2020   Procedure: REMOVAL PORT-A-CATH;  Surgeon: Griselda Mineroth, Paul III, MD;  Location: [facility];  Service: General;  Laterality: Left;   PORTACATH PLACEMENT Left 03/21/2020   Procedure: PORT PLACEMENT WITH ULTRASOUND;  Surgeon: Griselda Mineroth, Paul III, MD;  Location: [facility];  Service: General;  Laterality: Left;   THYROIDECTOMY      Social History   Socioeconomic History   Marital status: Married    Spouse name: Not on file   Number of children: Not on file   Years of education: Not on file   Highest education level: Not on file  Occupational History   Not on file  Tobacco Use   Smoking status: Never   Smokeless tobacco: Never  Vaping Use   Vaping Use: Never used  Substance and Sexual Activity   Alcohol use: Never   Drug use: Never   Sexual activity: Not on file  Other Topics Concern   Not on file  Social History Narrative   Not on file   Social Determinants of Health   Financial Resource Strain: Low Risk  (02/03/2021)   Overall Financial Resource Strain (CARDIA)    Difficulty of Paying Living Expenses: Not hard at all  Food Insecurity: No Food Insecurity (02/03/2021)   Hunger Vital Sign    Worried About Running Out of Food in the Last Year: Never true    Ran Out of Food in the Last Year: Never true  Transportation Needs: No Transportation Needs (02/03/2021)   PRAPARE - Administrator, Civil ServiceTransportation    Lack of Transportation (Medical): No    Lack of Transportation (Non-Medical): No  Physical Activity: Sufficiently Active (02/03/2021)    Exercise Vital Sign    Days of Exercise per Week: 4 days    Minutes of Exercise per Session: 50 min  Stress: No Stress Concern Present (02/03/2021)   [employer] - Occupational Stress Questionnaire    Feeling of Stress : Not at all  Social Connections: Socially Integrated (02/03/2021)   Social Connection and Isolation Panel [NHANES]    Frequency of Communication with Friends and Family: More than three times a week    Frequency of Social Gatherings with Friends and Family: Three times a week    Attends Religious Services: 1 to 4 times per year    Active Member of Clubs or Organizations: No    Attends BankerClub or Organization Meetings: 1 to 4 times per year    Marital Status: Married  Catering managerntimate Partner Violence: Not At Risk (02/03/2021)   Humiliation, Afraid, Rape, and Kick questionnaire    Fear of Current or Ex-Partner: No    Emotionally Abused: No    Physically  Abused: No    Sexually Abused: No    Family History  Problem Relation Age of Onset   Breast cancer Paternal Grandmother 63     Current Outpatient Medications:    triamcinolone ointment (KENALOG) 0.5 %, Apply 1 Application topically 2 (two) times daily as needed., Disp: 30 g, Rfl: 0   abemaciclib (VERZENIO) 100 MG tablet, Take 1 tablet by mouth 2 times daily. Swallow tablets whole. Do not chew, crush, or split tablets before swallowing., Disp: 56 tablet, Rfl: 6   anastrozole (ARIMIDEX) 1 MG tablet, Take 1 tablet (1 mg total) by mouth daily., Disp: 90 tablet, Rfl: 3   calcium carbonate (OS-CAL) 1250 (500 Ca) MG chewable tablet, Chew 1 tablet by mouth daily., Disp: , Rfl:    cholecalciferol (VITAMIN D3) 25 MCG (1000 UNIT) tablet, Take 1,000 Units by mouth daily., Disp: , Rfl:    levothyroxine (SYNTHROID) 125 MCG tablet, Take 1 tablet (125 mcg total) by mouth daily before breakfast. (Patient taking differently: Take 100 mcg by mouth daily before breakfast. Pt is taking 100 mcg since Dec. 13,2022 per PCP),  Disp: , Rfl:    loperamide (IMODIUM) 2 MG capsule, Take by mouth as needed for diarrhea or loose stools. (Patient not taking: Reported on 11/20/2020), Disp: , Rfl:    Multiple Vitamins-Minerals (MULTIVITAMIN WITH MINERALS) tablet, Take 1 tablet by mouth daily., Disp: , Rfl:    ondansetron (ZOFRAN) 8 MG tablet, Take 1 tablet (8 mg total) by mouth every 8 (eight) hours as needed for nausea or vomiting. (Patient not taking: Reported on 02/03/2021), Disp: 30 tablet, Rfl: 3   prochlorperazine (COMPAZINE) 10 MG tablet, Take 1 tablet (10 mg total) by mouth every 6 (six) hours as needed for nausea or vomiting. (Patient not taking: Reported on 02/03/2021), Disp: 30 tablet, Rfl: 3   rosuvastatin (CRESTOR) 10 MG tablet, Take 1 tablet (10 mg total) by mouth daily., Disp: , Rfl:   PHYSICAL EXAM: ECOG FS:1 - Symptomatic but completely ambulatory    Vitals:   05/25/22 1000  BP: (!) 144/77  Pulse: 70  Resp: 15  Temp: (!) 97.5 F (36.4 C)  TempSrc: Oral  SpO2: 97%  Weight: 153 lb 1.6 oz (69.4 kg)   Physical Exam Vitals and nursing note reviewed.  Constitutional:      Appearance: She is well-developed. She is not ill-appearing or toxic-appearing.  HENT:     Head: Normocephalic.     Nose: Nose normal.  Eyes:     Conjunctiva/sclera: Conjunctivae normal.  Neck:     Vascular: No JVD.  Cardiovascular:     Rate and Rhythm: Normal rate.  Pulmonary:     Effort: Pulmonary effort is normal.  Abdominal:     General: There is no distension.  Musculoskeletal:     Cervical back: Normal range of motion.  Skin:    General: Skin is warm and dry.     Findings: Rash present.     Comments: Rash on right shoulder, mid back and anterior waist. Please see media below  Neurological:     Mental Status: She is oriented to person, place, and time.           LABORATORY DATA: I have reviewed the data as listed    Latest Ref Rng & Units 04/28/2022    2:18 PM 01/25/2022    2:26 PM 10/26/2021    2:13 PM   CBC  WBC 4.0 - 10.5 K/uL 4.7  5.3  5.9   Hemoglobin 12.0 -  15.0 g/dL 50.5  39.7  67.3   Hematocrit 36.0 - 46.0 % 31.3  33.0  33.4   Platelets 150 - 400 K/uL 154  160  174         Latest Ref Rng & Units 04/28/2022    2:18 PM 01/25/2022    2:26 PM 10/26/2021    2:13 PM  CMP  Glucose 70 - 99 mg/dL 419  95  379   BUN 8 - 23 mg/dL 15  10  13    Creatinine 0.44 - 1.00 mg/dL 0.24  0.97  3.53   Sodium 135 - 145 mmol/L 138  140  139   Potassium 3.5 - 5.1 mmol/L 3.7  4.1  3.8   Chloride 98 - 111 mmol/L 105  105  103   CO2 22 - 32 mmol/L 25  30  32   Calcium 8.9 - 10.3 mg/dL 8.9  29.9  9.8   Total Protein 6.5 - 8.1 g/dL 7.0  7.0  7.2   Total Bilirubin 0.3 - 1.2 mg/dL 0.3  0.4  0.4   Alkaline Phos 38 - 126 U/L 63  65  60   AST 15 - 41 U/L 21  19  23    ALT 0 - 44 U/L 26  20  27         RADIOGRAPHIC STUDIES (from last 24 hours if applicable) I have personally reviewed the radiological images as listed and agreed with the findings in the report. No results found.      Visit Diagnosis: 1. Malignant neoplasm of lower-outer quadrant of right breast of female, estrogen receptor positive   2. Rash      No orders of the defined types were placed in this encounter.   All questions were answered. The patient knows to call the clinic with any problems, questions or concerns. No barriers to learning was detected.  I have spent a total of 30 minutes minutes of face-to-face and non-face-to-face time, preparing to see the patient, obtaining and/or reviewing separately obtained history, performing a medically appropriate examination, counseling and educating the patient, ordering medication, documenting clinical information in the electronic health record, and care coordination (communications with other health care professionals or caregivers).    Thank you for allowing me to participate in the care of this patient.    Shanon Ace, PA-C Department of Hematology/Oncology [facility] at [facility] Phone: [phone]  [phone]    05/25/2022 11:25 AM

## 2022-06-01 ENCOUNTER — Other Ambulatory Visit (HOSPITAL_COMMUNITY): Payer: Self-pay

## 2022-06-01 ENCOUNTER — Encounter: Payer: Self-pay | Admitting: *Deleted

## 2022-06-01 NOTE — Progress Notes (Signed)
Received mychart message from pt with complaint of ongoing rash despite stopping Anastrozole and applying Kenalog cream.  Per MD pt needing to be seen by [facility] for further evaluation and tx.  Appt scheduled, pt verbalized understanding.

## 2022-06-04 ENCOUNTER — Inpatient Hospital Stay (HOSPITAL_BASED_OUTPATIENT_CLINIC_OR_DEPARTMENT_OTHER): Payer: Commercial Managed Care - PPO | Admitting: Physician Assistant

## 2022-06-04 VITALS — BP 124/65 | HR 77 | Temp 97.7°F | Resp 16 | Ht 67.0 in | Wt 153.1 lb

## 2022-06-04 DIAGNOSIS — C50511 Malignant neoplasm of lower-outer quadrant of right female breast: Secondary | ICD-10-CM

## 2022-06-04 DIAGNOSIS — Z170 Estrogen receptor positive status [ER+]: Secondary | ICD-10-CM

## 2022-06-04 DIAGNOSIS — R21 Rash and other nonspecific skin eruption: Secondary | ICD-10-CM

## 2022-06-04 MED ORDER — TRIAMCINOLONE ACETONIDE 0.5 % EX OINT: 1.0000 | TOPICAL_OINTMENT | Freq: Two times a day (BID) | CUTANEOUS | 0 refills | Status: DC | PRN

## 2022-06-04 NOTE — Progress Notes (Signed)
Symptom Management Consult Note [facility]    Patient Care Team: Merri Brunette, MD as PCP - General (Internal Medicine) Serena Croissant, MD as Consulting Physician (Hematology and Oncology) Griselda Miner, MD as Consulting Physician (General Surgery) Dorothy Puffer, MD as Consulting Physician (Radiation Oncology)    Name / MRN / DOB: [name]  [id number]  1955/10/07   Date of visit: 06/04/2022   Chief Complaint/Reason for visit: rash   Current Therapy: Verzenio and Anastrazole    ASSESSMENT & PLAN: Patient is a 67 y.o. female  with oncologic history of malignant neoplasm of lower-outer quadrant of right breast, estrogen receptor positive followed by Dr. Pamelia Hoit.  I have viewed most recent oncology note and lab work.    #Malignant neoplasm of lower-outer quadrant of right breast of female, estrogen receptor positive  - Next appointment with pharmacist is 07/21/22   #Rash Has been holding anastrozole x 11 days.  On exam rash has improved compared to last visit. Today today appears more urticarial. -Will give her a refill for triamcinolone and have her follow-up with dermatologist if rash does not resolve.  I offered patient Medrol Dosepak to attempt resolution of rash.  However she would like to avoid p.o. steroids as she has had difficulty tolerating them in the past. -Patient is agreeable with plan.      Heme/Onc History: Oncology History  Malignant neoplasm of lower-outer quadrant of right breast of female, estrogen receptor positive  08/01/2019 Initial Diagnosis   Palpable right breast mass for 1 month.  Mammogram revealed a 5 cm mass 8:30 position, 3 cm intramammary lymph node at 10 o'clock position and 2 enlarged right axillary lymph nodes.  Biopsy revealed grade 1 IDC ER 90%, PR 10%, Ki-67 10%, HER-2 negative.  Intramammary node and axillary lymph node both positive with similar prognostic profile   08/08/2019 -  Neo-Adjuvant Anti-estrogen oral  therapy   Anastrozole while deciding treatment plan    02/28/2020 Surgery   Bilateral mastectomies Carolynne Edouard):  Right breast: IDC, 6.5cm, metastatic carcinoma in 6/7 right axillary lymph nodes, 1.5cm, 1.5cm, and 0.3cm, and one intramammary lymph node, 2.5cm Left breast: no evidence of malignancy in the breast or 1 left axillary lymph node.   03/26/2020 - 08/06/2020 Chemotherapy   Dose dense Adriamycin and Cytoxan followed by Taxol x12   07/31/2020 Cancer Staging   Staging form: Breast, AJCC 8th Edition - Pathologic stage from 07/31/2020: Stage IB (pT3, pN2a, cM0, G1, ER+, PR+, HER2-) - Signed by Loa Socks, NP on 02/03/2021 Histologic grading system: 3 grade system   09/09/2020 - 10/24/2020 Radiation Therapy   Adjuvant radiation   11/2020 -  Anti-estrogen oral therapy    Antiestrogen therapy with anastrozole 1 mg daily (started 11/15/2020), Verzenio started 11/24/2020 (gradually escalating dose)       Interval history-: [name] is a 67 y.o. female with oncologic history as above presenting to [facility] today with chief complaint of rash.  She presents unaccompanied to clinic.  Patient seen for heme rash on 05/25/22.  There was concern it could be related to her anastrozole so she since discontinued it.  She was prescribed triamcinolone ointment and encouraged to use Zyrtec to help with the itching.  Patient states that the ointment does help however the rash has not totally resolved.  She denies any new areas of rash.  Denies any new medications, close, soaps or lotions.  She does note that when out in the sun doing carpool  this week it seemed like the rash worsened.  She denies any fever or chills.  Denies any rash on palms or soles.     ROS  All other systems are reviewed and are negative for acute change except as noted in the HPI.    No Known Allergies   Past Medical History:  Diagnosis Date   Complication of anesthesia    Dyslipidemia    Hypothyroidism     Pneumonia    PONV (postoperative nausea and vomiting)    Thyroid cancer (HCC)      Past Surgical History:  Procedure Laterality Date   MASTECTOMY WITH AXILLARY LYMPH NODE DISSECTION Bilateral 02/28/2020   Procedure: BILATERAL MASTECTOMY WITH RIGHT SENTINEL LYMPH NODE MAPPING AND RIGHT TARGETED RADIOACTIVE SEED GUIDED  LYMPH NODE DISSECTION;  Surgeon: Griselda Miner, MD;  Location: [facility] OR;  Service: General;  Laterality: Bilateral;  PEC BLOCK, RNFA (SHARON HITCHCOCK)   PORT-A-CATH REMOVAL Left 10/13/2020   Procedure: REMOVAL PORT-A-CATH;  Surgeon: Griselda Miner, MD;  Location: [facility];  Service: General;  Laterality: Left;   PORTACATH PLACEMENT Left 03/21/2020   Procedure: PORT PLACEMENT WITH ULTRASOUND;  Surgeon: Griselda Miner, MD;  Location: [facility];  Service: General;  Laterality: Left;   THYROIDECTOMY      Social History   Socioeconomic History   Marital status: Married    Spouse name: Not on file   Number of children: Not on file   Years of education: Not on file   Highest education level: Not on file  Occupational History   Not on file  Tobacco Use   Smoking status: Never   Smokeless tobacco: Never  Vaping Use   Vaping Use: Never used  Substance and Sexual Activity   Alcohol use: Never   Drug use: Never   Sexual activity: Not on file  Other Topics Concern   Not on file  Social History Narrative   Not on file   Social Determinants of Health   Financial Resource Strain: Low Risk  (02/03/2021)   Overall Financial Resource Strain (CARDIA)    Difficulty of Paying Living Expenses: Not hard at all  Food Insecurity: No Food Insecurity (02/03/2021)   Hunger Vital Sign    Worried About Running Out of Food in the Last Year: Never true    Ran Out of Food in the Last Year: Never true  Transportation Needs: No Transportation Needs (02/03/2021)   PRAPARE - Administrator, Civil Service (Medical): No    Lack of Transportation  (Non-Medical): No  Physical Activity: Sufficiently Active (02/03/2021)   Exercise Vital Sign    Days of Exercise per Week: 4 days    Minutes of Exercise per Session: 50 min  Stress: No Stress Concern Present (02/03/2021)   [employer] of Occupational Health - Occupational Stress Questionnaire    Feeling of Stress : Not at all  Social Connections: Socially Integrated (02/03/2021)   Social Connection and Isolation Panel [NHANES]    Frequency of Communication with Friends and Family: More than three times a week    Frequency of Social Gatherings with Friends and Family: Three times a week    Attends Religious Services: 1 to 4 times per year    Active Member of Clubs or Organizations: No    Attends Banker Meetings: 1 to 4 times per year    Marital Status: Married  Catering manager Violence: Not At Risk (02/03/2021)   Humiliation, Afraid, Rape,  and Kick questionnaire    Fear of Current or Ex-Partner: No    Emotionally Abused: No    Physically Abused: No    Sexually Abused: No    Family History  Problem Relation Age of Onset   Breast cancer Paternal Grandmother 1     Current Outpatient Medications:    abemaciclib (VERZENIO) 100 MG tablet, Take 1 tablet by mouth 2 times daily. Swallow tablets whole. Do not chew, crush, or split tablets before swallowing., Disp: 56 tablet, Rfl: 6   anastrozole (ARIMIDEX) 1 MG tablet, Take 1 tablet (1 mg total) by mouth daily., Disp: 90 tablet, Rfl: 3   calcium carbonate (OS-CAL) 1250 (500 Ca) MG chewable tablet, Chew 1 tablet by mouth daily., Disp: , Rfl:    cholecalciferol (VITAMIN D3) 25 MCG (1000 UNIT) tablet, Take 1,000 Units by mouth daily., Disp: , Rfl:    levothyroxine (SYNTHROID) 125 MCG tablet, Take 1 tablet (125 mcg total) by mouth daily before breakfast. (Patient taking differently: Take 100 mcg by mouth daily before breakfast. Pt is taking 100 mcg since Dec. 13,2022 per PCP), Disp: , Rfl:    loperamide (IMODIUM) 2 MG  capsule, Take by mouth as needed for diarrhea or loose stools. (Patient not taking: Reported on 11/20/2020), Disp: , Rfl:    Multiple Vitamins-Minerals (MULTIVITAMIN WITH MINERALS) tablet, Take 1 tablet by mouth daily., Disp: , Rfl:    ondansetron (ZOFRAN) 8 MG tablet, Take 1 tablet (8 mg total) by mouth every 8 (eight) hours as needed for nausea or vomiting. (Patient not taking: Reported on 02/03/2021), Disp: 30 tablet, Rfl: 3   prochlorperazine (COMPAZINE) 10 MG tablet, Take 1 tablet (10 mg total) by mouth every 6 (six) hours as needed for nausea or vomiting. (Patient not taking: Reported on 02/03/2021), Disp: 30 tablet, Rfl: 3   rosuvastatin (CRESTOR) 10 MG tablet, Take 1 tablet (10 mg total) by mouth daily., Disp: , Rfl:    triamcinolone ointment (KENALOG) 0.5 %, Apply 1 Application topically 2 (two) times daily as needed., Disp: 30 g, Rfl: 0  PHYSICAL EXAM: ECOG FS:1 - Symptomatic but completely ambulatory    Vitals:   06/04/22 1449  BP: 124/65  Pulse: 77  Resp: 16  Temp: 97.7 F (36.5 C)  TempSrc: Oral  SpO2: 100%  Weight: 153 lb 1.6 oz (69.4 kg)  Height: 5\' 7"  (1.702 m)   Physical Exam Vitals and nursing note reviewed.  Constitutional:      Appearance: She is not ill-appearing or toxic-appearing.  HENT:     Head: Normocephalic.  Eyes:     Conjunctiva/sclera: Conjunctivae normal.  Cardiovascular:     Rate and Rhythm: Normal rate.  Pulmonary:     Effort: Pulmonary effort is normal.  Abdominal:     General: There is no distension.  Musculoskeletal:     Cervical back: Normal range of motion.  Skin:    General: Skin is warm and dry.     Findings: Rash present.     Comments: Urticarial rash on mid back and waistline.  Neurological:     Mental Status: She is alert.        LABORATORY DATA: I have reviewed the data as listed    Latest Ref Rng & Units 04/28/2022    2:18 PM 01/25/2022    2:26 PM 10/26/2021    2:13 PM  CBC  WBC 4.0 - 10.5 K/uL 4.7  5.3  5.9    Hemoglobin 12.0 - 15.0 g/dL 91.4  78.2  11.7  Hematocrit 36.0 - 46.0 % 31.3  33.0  33.4   Platelets 150 - 400 K/uL 154  160  174         Latest Ref Rng & Units 04/28/2022    2:18 PM 01/25/2022    2:26 PM 10/26/2021    2:13 PM  CMP  Glucose 70 - 99 mg/dL 161  95  096   BUN 8 - 23 mg/dL Creatinine 0.44 - 1.00 mg/dL 0.45  4.09  8.11   Sodium 135 - 145 mmol/L 138  140  139   Potassium 3.5 - 5.1 mmol/L 3.7  4.1  3.8   Chloride 98 - 111 mmol/L 105  105  103   CO2 22 - 32 mmol/L 25  30  32   Calcium 8.9 - 10.3 mg/dL 8.9  91.4  9.8   Total Protein 6.5 - 8.1 g/dL 7.0  7.0  7.2   Total Bilirubin 0.3 - 1.2 mg/dL 0.3  0.4  0.4   Alkaline Phos 38 - 126 U/L 63  65  60   AST 15 - 41 U/L ALT 0 - 44 U/L RADIOGRAPHIC STUDIES (from last 24 hours if applicable) I have personally reviewed the radiological images as listed and agreed with the findings in the report. No results found.      Visit Diagnosis: No diagnosis found.   No orders of the defined types were placed in this encounter.   All questions were answered. The patient knows to call the clinic with any problems, questions or concerns. No barriers to learning was detected.  I have spent a total of 30 minutes minutes of face-to-face and non-face-to-face time, preparing to see the patient, obtaining and/or reviewing separately obtained history, performing a medically appropriate examination, counseling and educating the patient, ordering medication, documenting clinical information in the electronic health record.    Thank you for allowing me to participate in the care of this patient.    Shanon Ace, PA-C Department of Hematology/Oncology [facility] at [facility] Phone: [phone]  [phone]    06/04/2022 4:43 PM

## 2022-06-07 ENCOUNTER — Encounter: Payer: Self-pay | Admitting: Hematology and Oncology

## 2022-06-08 ENCOUNTER — Other Ambulatory Visit (HOSPITAL_COMMUNITY): Payer: Self-pay

## 2022-06-11 ENCOUNTER — Other Ambulatory Visit (HOSPITAL_COMMUNITY): Payer: Self-pay

## 2022-06-21 ENCOUNTER — Ambulatory Visit (AMBULATORY_SURGERY_CENTER): Payer: Commercial Managed Care - PPO

## 2022-06-21 VITALS — Ht 67.0 in | Wt 147.0 lb

## 2022-06-21 DIAGNOSIS — Z1211 Encounter for screening for malignant neoplasm of colon: Secondary | ICD-10-CM

## 2022-06-21 NOTE — Progress Notes (Signed)

## 2022-06-23 ENCOUNTER — Other Ambulatory Visit: Payer: Self-pay | Admitting: Hematology and Oncology

## 2022-07-08 ENCOUNTER — Other Ambulatory Visit (HOSPITAL_COMMUNITY): Payer: Self-pay

## 2022-07-15 ENCOUNTER — Encounter: Payer: Self-pay | Admitting: Internal Medicine

## 2022-07-16 ENCOUNTER — Other Ambulatory Visit (HOSPITAL_COMMUNITY): Payer: Self-pay

## 2022-07-17 ENCOUNTER — Other Ambulatory Visit (HOSPITAL_COMMUNITY): Payer: Self-pay

## 2022-07-18 ENCOUNTER — Encounter: Payer: Self-pay | Admitting: Certified Registered Nurse Anesthetist

## 2022-07-21 ENCOUNTER — Inpatient Hospital Stay: Payer: Commercial Managed Care - PPO | Admitting: Pharmacist

## 2022-07-21 ENCOUNTER — Inpatient Hospital Stay: Payer: Commercial Managed Care - PPO | Attending: Hematology and Oncology

## 2022-07-21 ENCOUNTER — Other Ambulatory Visit: Payer: Self-pay

## 2022-07-21 VITALS — BP 117/61 | HR 82 | Temp 97.6°F | Resp 18 | Ht 67.0 in | Wt 152.9 lb

## 2022-07-21 DIAGNOSIS — C50511 Malignant neoplasm of lower-outer quadrant of right female breast: Secondary | ICD-10-CM | POA: Insufficient documentation

## 2022-07-21 DIAGNOSIS — Z170 Estrogen receptor positive status [ER+]: Secondary | ICD-10-CM | POA: Diagnosis not present

## 2022-07-21 LAB — CMP (CANCER CENTER ONLY)
ALT: 23 U/L (ref 0–44)
AST: 19 U/L (ref 15–41)
Albumin: 4.3 g/dL (ref 3.5–5.0)
Alkaline Phosphatase: 65 U/L (ref 38–126)
Anion gap: 5 (ref 5–15)
BUN: 15 mg/dL (ref 8–23)
CO2: 30 mmol/L (ref 22–32)
Calcium: 9.5 mg/dL (ref 8.9–10.3)
Chloride: 104 mmol/L (ref 98–111)
Creatinine: 1.02 mg/dL — ABNORMAL HIGH (ref 0.44–1.00)
GFR, Estimated: >60 mL/min (ref >60)
Glucose, Bld: 108 mg/dL — ABNORMAL HIGH (ref 70–99)
Potassium: 3.9 mmol/L (ref 3.5–5.1)
Sodium: 139 mmol/L (ref 135–145)
Total Bilirubin: 0.4 mg/dL (ref 0.3–1.2)
Total Protein: 6.9 g/dL (ref 6.5–8.1)

## 2022-07-21 LAB — CBC WITH DIFFERENTIAL (CANCER CENTER ONLY)
Abs Immature Granulocytes: 0.02 10*3/uL (ref 0.00–0.07)
Basophils Absolute: 0.1 10*3/uL (ref 0.0–0.1)
Basophils Relative: 1 %
Eosinophils Absolute: 0.3 10*3/uL (ref 0.0–0.5)
Eosinophils Relative: 6 %
HCT: 35.3 % — ABNORMAL LOW (ref 36.0–46.0)
Hemoglobin: 12.2 g/dL (ref 12.0–15.0)
Immature Granulocytes: 0 %
Lymphocytes Relative: 18 %
Lymphs Abs: 1 10*3/uL (ref 0.7–4.0)
MCH: 34.4 pg — ABNORMAL HIGH (ref 26.0–34.0)
MCHC: 34.6 g/dL (ref 30.0–36.0)
MCV: 99.4 fL (ref 80.0–100.0)
Monocytes Absolute: 0.4 10*3/uL (ref 0.1–1.0)
Monocytes Relative: 7 %
Neutro Abs: 4 10*3/uL (ref 1.7–7.7)
Neutrophils Relative %: 68 %
Platelet Count: 207 10*3/uL (ref 150–400)
RBC: 3.55 MIL/uL — ABNORMAL LOW (ref 3.87–5.11)
RDW: 11.4 % — ABNORMAL LOW (ref 11.5–15.5)
WBC Count: 5.9 10*3/uL (ref 4.0–10.5)
nRBC: 0 % (ref 0.0–0.2)

## 2022-07-21 MED ORDER — ANASTROZOLE 1 MG PO TABS: 1.0000 mg | ORAL_TABLET | Freq: Every day | ORAL | 3 refills | Status: DC

## 2022-07-21 NOTE — Progress Notes (Signed)
[facility]       Telephone: [phone]?Fax: [phone]   Oncology Clinical Pharmacist Practitioner Progress Note  [name] was contacted via in-person to discuss her chemotherapy regimen for abemaciclib which they receive under the care of Dr. Serena Croissant.   Current treatment regimen and start date Abemaciclib 100 mg by mouth every 12 hours (started on 05/18/21)  == max dose per Dr. Pamelia Hoit 150 mg by mouth every 12 hours (started on 12/10/20) 100 mg by mouth every 12 hours started on 11/27/20 50 mg by mouth every 12 hours (started on 11/13/20)   Anastrozole 1 mg by mouth (started 11/15/20)   Interval History She continues on abemaciclib 100 mg by mouth  every 12 hours on days 1 to 28 of a 28-day cycle. This is being given in combination with anastrozole . Therapy is planned to continue until 2 years in the adjuvant setting per the monarchE trial data.  [name] was seen today by clinical pharmacy as a follow-up to her abemaciclib management.  She was last seen by clinical pharmacy on 04/28/22 and Dr. Pamelia Hoit on 01/25/22.   She was seen by [facility] on 4/9//24 and 06/04/22 for a rash. Her anastrozole was held as a precaution. She was then referred to dermatology who determined she had a staph infection and was prescribed cephalexin 500 mg TID x 10 days on 07/13/22 by Dr. Glynn Octave and triamcinolone ointment. Today [name] says the rash is much improved and not itchy any longer. She will be restarting anastrozole this evening. A prescription refill for anastrozole was sent to her local pharmacy of choice. We updated her medication list to reflect these changes.  Response to Therapy Ms. Awtrey continues to do very well on abemaciclib.  As above, she was holding anastrozole as a precaution until being seen in clinic. We have discussed restarting anastrozole this evening since dermatology felt that the rash was more likely due to a staph infection versus  anastrozole.  She was in agreement with this plan.    Her serum creatinine is slightly elevated but this is likely due to her not drinking enough fluids as it has been slightly elevated in the past.  She was encouraged to drink more fluids today.  She did state that she has had a "dry mouth" at times lately.  She is scheduled for a routine colonoscopy on 07/23/22 and she will next see Dr. Pamelia Hoit with labs on 11/08/22.  She had duplicate lab and visit orders on 10/26/22 with Dr. Pamelia Hoit but these will be removed per her request.  She is due to finish up her adjuvant abemaciclib on 11/13/22.  Her last DEXA scan was 02/05/21. We will add a repeat DEXA to be done at the end of this year.  She can follow with clinical pharmacy on an as-needed basis going forward.  She knows that she can contact Dr. Earmon [place] clinic prior to September if needed.  She verbalized understanding of the plan and is in agreement.  Labs, vitals, treatment parameters, and manufacturer guidelines assessing toxicity were reviewed with [name] today. Based on these values, patient is in agreement to continue abemaciclib therapy at this time.  Allergies No Known Allergies  Vitals    07/21/2022    2:59 PM 06/21/2022   10:05 AM 06/04/2022    2:49 PM  Oncology Vitals  Height 170 cm 170 cm 170 cm  Weight 69.355 kg 66.679 kg 69.446 kg  Weight (lbs)  152 lbs 14 oz 147 lbs 153 lbs 2 oz  BMI 23.95 kg/m2   23.95 kg/m2 23.02 kg/m2   23.02 kg/m2 23.98 kg/m2   23.98 kg/m2  Temp 97.6 F (36.4 C)  97.7 F (36.5 C)  Pulse Rate 82  77  BP 117/61  124/65  Resp 18  16  SpO2 99 %  100 %  BSA (m2) 1.81 m2   1.81 m2 1.78 m2   1.78 m2 1.81 m2   1.81 m2    Laboratory Data    Latest Ref Rng & Units 07/21/2022    2:46 PM 04/28/2022    2:18 PM 01/25/2022    2:26 PM  CBC EXTENDED  WBC 4.0 - 10.5 K/uL 5.9  4.7  5.3   RBC 3.87 - 5.11 MIL/uL 3.55  3.12  3.28   Hemoglobin 12.0 - 15.0 g/dL 40.9  81.1  91.4   HCT 36.0 - 46.0 % 35.3  31.3  33.0    Platelets 150 - 400 K/uL 207  154  160   NEUT# 1.7 - 7.7 K/uL 4.0  2.7  3.4   Lymph# 0.7 - 4.0 K/uL 1.0  1.3  1.2        Latest Ref Rng & Units 07/21/2022    2:46 PM 04/28/2022    2:18 PM 01/25/2022    2:26 PM  CMP  Glucose 70 - 99 mg/dL 782  956  95   BUN 8 - 23 mg/dL 15  15  10    Creatinine 0.44 - 1.00 mg/dL 2.13  0.86  5.78   Sodium 135 - 145 mmol/L 139  138  140   Potassium 3.5 - 5.1 mmol/L 3.9  3.7  4.1   Chloride 98 - 111 mmol/L 104  105  105   CO2 22 - 32 mmol/L 30  25  30    Calcium 8.9 - 10.3 mg/dL 9.5  8.9  46.9   Total Protein 6.5 - 8.1 g/dL 6.9  7.0  7.0   Total Bilirubin 0.3 - 1.2 mg/dL 0.4  0.3  0.4   Alkaline Phos 38 - 126 U/L 65  63  65   AST 15 - 41 U/L 19  21  19    ALT 0 - 44 U/L 23  26  20     Adverse Effects Assessment Serum creatinine: Has been slightly elevated in the past.  At her last 2 visits her serum creatinine was normal.  We encouraged her to drink more fluids today.  Adherence Assessment [name] reports missing 0 doses over the past 12 weeks.   Reason for missed dose: N/A Patient was re-educated on importance of adherence.   Access Assessment [name] is currently receiving her abemaciclib through [facility] concerns: None  Medication Reconciliation The patient's medication list was reviewed today with the patient?  Yes New medications or herbal supplements have recently been started?  Yes, cephalexin and triamcinolone ointment.  Medication list updated Any medications have been discontinued?  Yes, medication list updated to reflect removal of several medications including ondansetron and prochlorperazine. The medication list was updated and reconciled based on the patient's most recent medication list in the electronic medical record (EMR) including herbal products and OTC medications.   Medications Current Outpatient Medications  Medication Sig Dispense Refill   cephALEXin (KEFLEX) 500 MG  capsule Take 500 mg by mouth 3 (three) times daily.     abemaciclib (VERZENIO) 100 MG tablet Take 1 tablet by  mouth 2 times daily. Swallow tablets whole. Do not chew, crush, or split tablets before swallowing. 56 tablet 6   anastrozole (ARIMIDEX) 1 MG tablet Take 1 tablet (1 mg total) by mouth daily. 90 tablet 3   calcium carbonate (OS-CAL) 1250 (500 Ca) MG chewable tablet Chew 1 tablet by mouth daily.     cholecalciferol (VITAMIN D3) 25 MCG (1000 UNIT) tablet Take 1,000 Units by mouth daily.     levothyroxine (SYNTHROID) 125 MCG tablet Take 1 tablet (125 mcg total) by mouth daily before breakfast. (Patient taking differently: Take 100 mcg by mouth daily before breakfast. Pt is taking 100 mcg since Dec. 13,2022 per PCP)     Multiple Vitamins-Minerals (MULTIVITAMIN WITH MINERALS) tablet Take 1 tablet by mouth daily.     rosuvastatin (CRESTOR) 10 MG tablet Take 1 tablet (10 mg total) by mouth daily.     triamcinolone ointment (KENALOG) 0.5 % Apply 1 Application topically 2 (two) times daily as needed. 30 g 0   No current facility-administered medications for this visit.   Drug-Drug Interactions (DDIs) DDIs were evaluated? Yes Significant DDIs?  No The patient was instructed to speak with their health care provider and/or the oral chemotherapy pharmacist before starting any new drug, including prescription or over the counter, natural / herbal products, or vitamins.  Supportive Care Diarrhea: we reviewed that diarrhea is common with abemaciclib and confirmed that she does have loperamide (Imodium) at home.  We reviewed how to take this medication PRN. Neutropenia: we discussed the importance of having a thermometer and what the Centers for Disease Control and Prevention (CDC) considers a fever which is 100.78F (38C) or higher.  Gave patient 24/7 triage line to call if any fevers or symptoms. ILD/Pneumonitis: we reviewed potential symptoms including cough, shortness, and fatigue.  VTE: reviewed  signs of DVT such as leg swelling, redness, pain, or tenderness and signs of PE such as shortness of breath, rapid or irregular heartbeat, cough, chest pain, or lightheadedness. Reviewed to take the medication every 12 hours (with food sometimes can be easier on the stomach) and to take it at the same time every day. Hepatotoxicity:WNL Drug interactions with grapefruit products Encouraged to drink more fluids as her serum creatinine was slightly elevated today  Dosing Assessment Hepatic adjustments needed?  No Renal adjustments needed?  No Toxicity adjustments needed?  No The current dosing regimen is appropriate to continue at this time.  Follow-Up Plan Continue abemaciclib 100 mg by mouth every 12 hours.  We will not be increasing the dose per patient request. She will finish up on 11/13/22 Restart anastrozole 1 mg by mouth daily.  Refill sent to her local pharmacy of choice Labs, Dr. Pamelia Hoit visit, week of 11/08/22.  She can see clinical pharmacy on an as-needed basis going forward Next due for DEXA scan on 02/06/23.  Order placed  [name] participated in the discussion, expressed understanding, and voiced agreement with the above plan. All questions were answered to her satisfaction. The patient was advised to contact the clinic at [phone] with any questions or concerns prior to her return visit.   I spent 30 minutes assessing and educating the patient.  Philip Eckersley A. Odetta Pink, PharmD, BCOP, CPP  Anselm Lis, RPH-CPP, 07/21/2022  3:57 PM   **Disclaimer: This note was dictated with voice recognition software. Similar sounding words can inadvertently be transcribed and this note may contain transcription errors which may not have been corrected upon publication of note.**

## 2022-07-23 ENCOUNTER — Ambulatory Visit (AMBULATORY_SURGERY_CENTER): Payer: Commercial Managed Care - PPO | Admitting: Internal Medicine

## 2022-07-23 ENCOUNTER — Encounter: Payer: Self-pay | Admitting: Internal Medicine

## 2022-07-23 VITALS — BP 121/82 | HR 61 | Temp 99.1°F | Resp 15 | Ht 67.0 in | Wt 147.0 lb

## 2022-07-23 DIAGNOSIS — Z1211 Encounter for screening for malignant neoplasm of colon: Secondary | ICD-10-CM | POA: Diagnosis present

## 2022-07-23 MED ORDER — SODIUM CHLORIDE 0.9 % IV SOLN: 500.0000 mL | Freq: Once | INTRAVENOUS | Status: DC

## 2022-07-23 NOTE — Progress Notes (Signed)
[facility] History and Physical   Primary Care Physician:  Merri Brunette, MD   Reason for Procedure:   Colon cancer screening  Plan:    colonoscopy     HPI: [name] is a 67 y.o. female here for screening exam   Past Medical History:  Diagnosis Date   Anemia    Complication of anesthesia    Dyslipidemia    Hypothyroidism    Pneumonia    PONV (postoperative nausea and vomiting)    Thyroid cancer [facility])     Past Surgical History:  Procedure Laterality Date   MASTECTOMY WITH AXILLARY LYMPH NODE DISSECTION Bilateral 02/28/2020   Procedure: BILATERAL MASTECTOMY WITH RIGHT SENTINEL LYMPH NODE MAPPING AND RIGHT TARGETED RADIOACTIVE SEED GUIDED  LYMPH NODE DISSECTION;  Surgeon: Griselda Miner, MD;  Location: [facility] OR;  Service: General;  Laterality: Bilateral;  PEC BLOCK, RNFA (SHARON [facility]ITCHCOCK)   PORT-A-CATH REMOVAL Left 10/13/2020   Procedure: REMOVAL PORT-A-CATH;  Surgeon: Griselda Miner, MD;  Location: [facility];  Service: General;  Laterality: Left;   PORTACATH PLACEMENT Left 03/21/2020   Procedure: PORT PLACEMENT WITH ULTRASOUND;  Surgeon: Griselda Miner, MD;  Location: [facility];  Service: General;  Laterality: Left;   THYROIDECTOMY      Prior to Admission medications   Medication Sig Start Date End Date Taking? Authorizing Provider  abemaciclib (VERZENIO) 100 MG tablet Take 1 tablet by mouth 2 times daily. Swallow tablets whole. Do not chew, crush, or split tablets before swallowing. 04/28/22  Yes Serena Croissant, MD  anastrozole (ARIMIDEX) 1 MG tablet Take 1 tablet (1 mg total) by mouth daily. 07/21/22  Yes Serena Croissant, MD  calcium carbonate (OS-CAL) 1250 (500 Ca) MG chewable tablet Chew 1 tablet by mouth daily.   Yes [provider]  cholecalciferol (VITAMIN D3) 25 MCG (1000 UNIT) tablet Take 1,000 Units by mouth daily.   Yes [provider]  levothyroxine (SYNTHROID) 125 MCG tablet Take 1 tablet (125 mcg  total) by mouth daily before breakfast. Patient taking differently: Take 100 mcg by mouth daily before breakfast. Pt is taking 100 mcg since Dec. 13,2022 per PCP 08/28/19  Yes Serena Croissant, MD  Multiple Vitamins-Minerals (MULTIVITAMIN WITH MINERALS) tablet Take 1 tablet by mouth daily.   Yes [provider]  rosuvastatin (CRESTOR) 10 MG tablet Take 1 tablet (10 mg total) by mouth daily. 08/28/19  Yes Serena Croissant, MD  augmented betamethasone dipropionate (DIPROLENE-AF) 0.05 % cream Apply 1 a small amount to affected area twice a day as needed Patient not taking: Reported on 07/23/2022 07/09/22   [provider]  mupirocin ointment (BACTROBAN) 2 % SMARTSIG:Sparingly Both Nares Twice Daily Patient not taking: Reported on 07/23/2022 07/13/22   [provider]  triamcinolone ointment (KENALOG) 0.5 % Apply 1 Application topically 2 (two) times daily as needed. Patient not taking: Reported on 07/23/2022 07/21/22   Serena Croissant, MD    Current Outpatient Medications  Medication Sig Dispense Refill   abemaciclib (VERZENIO) 100 MG tablet Take 1 tablet by mouth 2 times daily. Swallow tablets whole. Do not chew, crush, or split tablets before swallowing. 56 tablet 6   anastrozole (ARIMIDEX) 1 MG tablet Take 1 tablet (1 mg total) by mouth daily. 90 tablet 3   calcium carbonate (OS-CAL) 1250 (500 Ca) MG chewable tablet Chew 1 tablet by mouth daily.     cholecalciferol (VITAMIN D3) 25 MCG (1000 UNIT) tablet Take 1,000 Units by mouth daily.  levothyroxine (SYNTHROID) 125 MCG tablet Take 1 tablet (125 mcg total) by mouth daily before breakfast. (Patient taking differently: Take 100 mcg by mouth daily before breakfast. Pt is taking 100 mcg since Dec. 13,2022 per PCP)     Multiple Vitamins-Minerals (MULTIVITAMIN WITH MINERALS) tablet Take 1 tablet by mouth daily.     rosuvastatin (CRESTOR) 10 MG tablet Take 1 tablet (10 mg total) by mouth daily.     augmented betamethasone dipropionate  (DIPROLENE-AF) 0.05 % cream Apply 1 a small amount to affected area twice a day as needed (Patient not taking: Reported on 07/23/2022)     mupirocin ointment (BACTROBAN) 2 % SMARTSIG:Sparingly Both Nares Twice Daily (Patient not taking: Reported on 07/23/2022)     triamcinolone ointment (KENALOG) 0.5 % Apply 1 Application topically 2 (two) times daily as needed. (Patient not taking: Reported on 07/23/2022) 30 g 0   Current Facility-Administered Medications  Medication Dose Route Frequency Provider Last Rate Last Admin   0.9 %  sodium chloride infusion  500 mL Intravenous Once Iva Boop, MD        Allergies as of 07/23/2022   (No Known Allergies)    Family History  Problem Relation Age of Onset   Breast cancer Paternal Grandmother 25   Colon cancer Neg Hx    Colon polyps Neg Hx    Esophageal cancer Neg Hx    Prostate cancer Neg Hx    Rectal cancer Neg Hx     Social History   Socioeconomic History   Marital status: Married    Spouse name: Not on file   Number of children: Not on file   Years of education: Not on file   Highest education level: Not on file  Occupational History   Not on file  Tobacco Use   Smoking status: Never   Smokeless tobacco: Never  Vaping Use   Vaping Use: Never used  Substance and Sexual Activity   Alcohol use: Never   Drug use: Never   Sexual activity: Not on file  Other Topics Concern   Not on file  Social History Narrative   Not on file   Social Determinants of Health   Financial Resource Strain: Low Risk  (02/03/2021)   Overall Financial Resource Strain (CARDIA)    Difficulty of Paying Living Expenses: Not hard at all  Food Insecurity: No Food Insecurity (02/03/2021)   Hunger Vital Sign    Worried About Running Out of Food in the Last Year: Never true    Ran Out of Food in the Last Year: Never true  Transportation Needs: No Transportation Needs (02/03/2021)   PRAPARE - Administrator, Civil Service (Medical): No    Lack  of Transportation (Non-Medical): No  Physical Activity: Sufficiently Active (02/03/2021)   Exercise Vital Sign    Days of Exercise per Week: 4 days    Minutes of Exercise per Session: 50 min  Stress: No Stress Concern Present (02/03/2021)   [employer] of Occupational Health - Occupational Stress Questionnaire    Feeling of Stress : Not at all  Social Connections: Socially Integrated (02/03/2021)   Social Connection and Isolation Panel [NHANES]    Frequency of Communication with Friends and Family: More than three times a week    Frequency of Social Gatherings with Friends and Family: Three times a week    Attends Religious Services: 1 to 4 times per year    Active Member of Clubs or Organizations: No    Attends  Club or Organization Meetings: 1 to 4 times per year    Marital Status: Married  Catering manager Violence: Not At Risk (02/03/2021)   Humiliation, Afraid, Rape, and Kick questionnaire    Fear of Current or Ex-Partner: No    Emotionally Abused: No    Physically Abused: No    Sexually Abused: No    Review of Systems:  All other review of systems negative except as mentioned in the HPI.  Physical Exam: Vital signs BP 126/70   Pulse 63   Temp 99.1 F (37.3 C)   Ht 5\' 7"  (1.702 m)   Wt 147 lb (66.7 kg)   SpO2 99%   BMI 23.02 kg/m   General:   Alert,  Well-developed, well-nourished, pleasant and cooperative in NAD Lungs:  Clear throughout to auscultation.   Heart:  Regular rate and rhythm; no murmurs, clicks, rubs,  or gallops. Abdomen:  Soft, nontender and nondistended. Normal bowel sounds.   Neuro/Psych:  Alert and cooperative. Normal mood and affect. A and O x 3   @Rye Dorado  Sena Slate, MD, [facility] [phone] (pager) 07/23/2022 10:18 AM@

## 2022-07-23 NOTE — Patient Instructions (Addendum)
Your exam was normal - no polyps or cancer were seen.  Consider a repeat screening test in 10 years.  I appreciate the opportunity to care for you. Iva Boop, MD, FACG  YOU HAD AN ENDOSCOPIC PROCEDURE TODAY AT THE [facility]:   Refer to the procedure report that was given to you for any specific questions about what was found during the examination.  If the procedure report does not answer your questions, please call your gastroenterologist to clarify.  If you requested that your care partner not be given the details of your procedure findings, then the procedure report has been included in a sealed envelope for you to review at your convenience later.  YOU SHOULD EXPECT: Some feelings of bloating in the abdomen. Passage of more gas than usual.  Walking can help get rid of the air that was put into your GI tract during the procedure and reduce the bloating. If you had a lower endoscopy (such as a colonoscopy or flexible sigmoidoscopy) you may notice spotting of blood in your stool or on the toilet paper. If you underwent a bowel prep for your procedure, you may not have a normal bowel movement for a few days.  Please Note:  You might notice some irritation and congestion in your nose or some drainage.  This is from the oxygen used during your procedure.  There is no need for concern and it should clear up in a day or so.  SYMPTOMS TO REPORT IMMEDIATELY:   Following colonoscopy   Excessive bleeding or blood clots per rectum/in stool Significant abdominal tenderness or worsening of abdominal pain New abdominal swelling Fever above 100 F or higher   For urgent or emergent issues, a gastroenterologist can be reached at any hour by calling [phone]. Do not use MyChart messaging for urgent concerns.    DIET:  We do recommend a small meal at first, but then you may proceed to your regular diet.  Drink plenty of fluids but you should avoid alcoholic beverages for 24  hours.  MEDICATIONS: Continue present medications.  FOLLOW UP: Repeat colonoscopy in 10 years for surveillance purposes.  Thank you for allowing [place] to provide for your healthcare needs today.  ACTIVITY:  You should plan to take it easy for the rest of today and you should NOT DRIVE or use heavy machinery until tomorrow (because of the sedation medicines used during the test).    FOLLOW UP: Our staff will call the number listed on your records the next business day following your procedure.  We will call around 7:15- 8:00 am to check on you and address any questions or concerns that you may have regarding the information given to you following your procedure. If we do not reach you, we will leave a message.     If any biopsies were taken you will be contacted by phone or by letter within the next 1-3 weeks.  Please call us at [phone] if you have not heard about the biopsies in 3 weeks.    SIGNATURES/CONFIDENTIALITY: You and/or your care partner have signed paperwork which will be entered into your electronic medical record.  These signatures attest to the fact that that the information above on your After Visit Summary has been reviewed and is understood.  Full responsibility of the confidentiality of this discharge information lies with you and/or your care-partner.

## 2022-07-23 NOTE — Op Note (Signed)
[facility] Patient Name: [name] Procedure Date: 07/23/2022 10:27 AM MRN: [id number] Endoscopist: Iva Boop , MD, [id number] Age: 67 Referring MD:  Date of Birth: January 13, 1956 Gender: Female Account #: [id number] Procedure:                Colonoscopy Indications:              Screening for colorectal malignant neoplasm Medicines:                Propofol per Anesthesia, Monitored Anesthesia Care Procedure:                Pre-Anesthesia Assessment:                           - Prior to the procedure, a History and Physical                            was performed, and patient medications and                            allergies were reviewed. The patient's tolerance of                            previous anesthesia was also reviewed. The risks                            and benefits of the procedure and the sedation                            options and risks were discussed with the patient.                            All questions were answered, and informed consent                            was obtained. Prior Anticoagulants: The patient has                            taken no anticoagulant or antiplatelet agents. ASA                            Grade Assessment: II - A patient with mild systemic                            disease. After reviewing the risks and benefits,                            the patient was deemed in satisfactory condition to                            undergo the procedure.                           After obtaining informed consent, the colonoscope  was passed under direct vision. Throughout the                            procedure, the patient's blood pressure, pulse, and                            oxygen saturations were monitored continuously. The                            Olympus PCF-H190DL ([id number]) Colonoscope was                            introduced through the anus and advanced to the the                             cecum, identified by appendiceal orifice and                            ileocecal valve. The ileocecal valve, appendiceal                            orifice, and rectum were photographed. The quality                            of the bowel preparation was good. The colonoscopy                            was performed without difficulty. The patient                            tolerated the procedure well. The bowel preparation                            used was Miralax via split dose instruction. Scope In: 10:35:12 AM Scope Out: 10:50:41 AM Scope Withdrawal Time: 0 hours 12 minutes 30 seconds  Total Procedure Duration: 0 hours 15 minutes 29 seconds  Findings:                 The perianal and digital rectal examinations were                            normal.                           The colon (entire examined portion) appeared normal.                           No additional abnormalities were found on                            retroflexion. Complications:            No immediate complications. Estimated blood loss:                            None. Estimated Blood  Loss:     Estimated blood loss: none. Impression:               - The entire examined colon is normal.                           - No specimens collected. Recommendation:           - Consider repeat colonoscopy in 10 years for                            screening purposes.                           - Patient has a contact number available for                            emergencies. The signs and symptoms of potential                            delayed complications were discussed with the                            patient. Return to normal activities tomorrow.                            Written discharge instructions were provided to the                            patient.                           - Resume previous diet.                           - Continue present medications. Iva Boop, MD 07/23/2022  10:58:51 AM This report has been signed electronically.

## 2022-07-23 NOTE — Progress Notes (Signed)
Report given to PACU, vss 

## 2022-07-26 ENCOUNTER — Telehealth: Payer: Self-pay | Admitting: Pharmacist

## 2022-07-26 ENCOUNTER — Telehealth: Payer: Self-pay | Admitting: *Deleted

## 2022-07-26 NOTE — Telephone Encounter (Signed)
Cancelled appointments per los. Left voicemail.

## 2022-07-26 NOTE — Telephone Encounter (Signed)
  Follow up Call-     07/23/2022   10:06 AM  Call back number  Post procedure Call Back phone  # [phone]  Permission to leave phone message Yes     Patient questions:  Do you have a fever, pain , or abdominal swelling? No. Pain Score  0 *  Have you tolerated food without any problems? Yes.    Have you been able to return to your normal activities? Yes.    Do you have any questions about your discharge instructions: Diet   No. Medications  No. Follow up visit  No.  Do you have questions or concerns about your Care? No.  Actions: * If pain score is 4 or above: No action needed, pain <4.

## 2022-07-28 ENCOUNTER — Other Ambulatory Visit: Payer: Self-pay

## 2022-07-29 ENCOUNTER — Other Ambulatory Visit (HOSPITAL_COMMUNITY): Payer: Self-pay

## 2022-08-02 ENCOUNTER — Other Ambulatory Visit: Payer: Self-pay

## 2022-08-09 ENCOUNTER — Other Ambulatory Visit (HOSPITAL_COMMUNITY): Payer: Self-pay

## 2022-08-31 ENCOUNTER — Other Ambulatory Visit (HOSPITAL_COMMUNITY): Payer: Self-pay

## 2022-09-01 LAB — SIGNATERA
SIGNATERA MTM READOUT: 0 MTM/ml
SIGNATERA TEST RESULT: NEGATIVE

## 2022-09-02 ENCOUNTER — Other Ambulatory Visit (HOSPITAL_COMMUNITY): Payer: Self-pay

## 2022-09-06 ENCOUNTER — Telehealth: Payer: Self-pay

## 2022-09-06 NOTE — Telephone Encounter (Signed)
Called pt per MD to advise Signatera testing was negative/not detected. Pt verbalized understanding of results and knows [facility] will be in touch to schedule 3 mo repeat lab.   

## 2022-09-07 ENCOUNTER — Other Ambulatory Visit: Payer: Self-pay

## 2022-09-07 ENCOUNTER — Other Ambulatory Visit (HOSPITAL_COMMUNITY): Payer: Self-pay

## 2022-09-16 ENCOUNTER — Other Ambulatory Visit (HOSPITAL_COMMUNITY): Payer: Self-pay

## 2022-09-20 ENCOUNTER — Other Ambulatory Visit: Payer: Self-pay

## 2022-10-05 ENCOUNTER — Other Ambulatory Visit (HOSPITAL_COMMUNITY): Payer: Self-pay

## 2022-10-05 ENCOUNTER — Other Ambulatory Visit: Payer: Self-pay

## 2022-10-08 ENCOUNTER — Other Ambulatory Visit (HOSPITAL_COMMUNITY): Payer: Self-pay

## 2022-10-16 ENCOUNTER — Other Ambulatory Visit (HOSPITAL_COMMUNITY): Payer: Self-pay

## 2022-10-21 ENCOUNTER — Telehealth: Payer: Self-pay | Admitting: Hematology and Oncology

## 2022-10-21 NOTE — Telephone Encounter (Signed)
Rescheduled appointments per provider on call. Left voicemail with appointment changes.

## 2022-10-25 ENCOUNTER — Telehealth: Payer: Self-pay | Admitting: Hematology and Oncology

## 2022-10-26 ENCOUNTER — Inpatient Hospital Stay: Payer: Commercial Managed Care - PPO | Admitting: Hematology and Oncology

## 2022-10-26 ENCOUNTER — Inpatient Hospital Stay: Payer: Commercial Managed Care - PPO

## 2022-10-27 ENCOUNTER — Other Ambulatory Visit (HOSPITAL_COMMUNITY): Payer: Self-pay

## 2022-11-01 ENCOUNTER — Other Ambulatory Visit (HOSPITAL_COMMUNITY): Payer: Self-pay

## 2022-11-03 ENCOUNTER — Other Ambulatory Visit (HOSPITAL_COMMUNITY): Payer: Self-pay

## 2022-11-08 ENCOUNTER — Inpatient Hospital Stay: Payer: Commercial Managed Care - PPO | Admitting: Hematology and Oncology

## 2022-11-08 ENCOUNTER — Other Ambulatory Visit: Payer: Self-pay

## 2022-11-08 ENCOUNTER — Inpatient Hospital Stay: Payer: Commercial Managed Care - PPO

## 2022-11-23 ENCOUNTER — Inpatient Hospital Stay: Payer: Commercial Managed Care - PPO | Attending: Hematology and Oncology

## 2022-11-23 ENCOUNTER — Inpatient Hospital Stay (HOSPITAL_BASED_OUTPATIENT_CLINIC_OR_DEPARTMENT_OTHER): Payer: Commercial Managed Care - PPO | Admitting: Hematology and Oncology

## 2022-11-23 VITALS — BP 107/54 | HR 72 | Temp 97.8°F | Resp 18 | Ht 67.0 in | Wt 154.9 lb

## 2022-11-23 DIAGNOSIS — Z79811 Long term (current) use of aromatase inhibitors: Secondary | ICD-10-CM | POA: Diagnosis not present

## 2022-11-23 DIAGNOSIS — C50511 Malignant neoplasm of lower-outer quadrant of right female breast: Secondary | ICD-10-CM | POA: Diagnosis not present

## 2022-11-23 DIAGNOSIS — Z170 Estrogen receptor positive status [ER+]: Secondary | ICD-10-CM

## 2022-11-23 DIAGNOSIS — Z9221 Personal history of antineoplastic chemotherapy: Secondary | ICD-10-CM | POA: Insufficient documentation

## 2022-11-23 DIAGNOSIS — Z9013 Acquired absence of bilateral breasts and nipples: Secondary | ICD-10-CM | POA: Insufficient documentation

## 2022-11-23 DIAGNOSIS — Z79899 Other long term (current) drug therapy: Secondary | ICD-10-CM | POA: Diagnosis not present

## 2022-11-23 DIAGNOSIS — D649 Anemia, unspecified: Secondary | ICD-10-CM | POA: Diagnosis not present

## 2022-11-23 DIAGNOSIS — Z923 Personal history of irradiation: Secondary | ICD-10-CM | POA: Diagnosis not present

## 2022-11-23 DIAGNOSIS — C773 Secondary and unspecified malignant neoplasm of axilla and upper limb lymph nodes: Secondary | ICD-10-CM | POA: Diagnosis not present

## 2022-11-23 LAB — CBC WITH DIFFERENTIAL (CANCER CENTER ONLY)
Abs Immature Granulocytes: 0.03 10*3/uL (ref 0.00–0.07)
Basophils Absolute: 0.1 10*3/uL (ref 0.0–0.1)
Basophils Relative: 1 %
Eosinophils Absolute: 0.5 10*3/uL (ref 0.0–0.5)
Eosinophils Relative: 6 %
HCT: 35.8 % — ABNORMAL LOW (ref 36.0–46.0)
Hemoglobin: 12.2 g/dL (ref 12.0–15.0)
Immature Granulocytes: 0 %
Lymphocytes Relative: 17 %
Lymphs Abs: 1.3 10*3/uL (ref 0.7–4.0)
MCH: 33 pg (ref 26.0–34.0)
MCHC: 34.1 g/dL (ref 30.0–36.0)
MCV: 96.8 fL (ref 80.0–100.0)
Monocytes Absolute: 0.6 10*3/uL (ref 0.1–1.0)
Monocytes Relative: 8 %
Neutro Abs: 5.1 10*3/uL (ref 1.7–7.7)
Neutrophils Relative %: 68 %
Platelet Count: 199 10*3/uL (ref 150–400)
RBC: 3.7 MIL/uL — ABNORMAL LOW (ref 3.87–5.11)
RDW: 11.9 % (ref 11.5–15.5)
WBC Count: 7.6 10*3/uL (ref 4.0–10.5)
nRBC: 0 % (ref 0.0–0.2)

## 2022-11-23 LAB — CMP (CANCER CENTER ONLY)
ALT: 34 U/L (ref 0–44)
AST: 26 U/L (ref 15–41)
Albumin: 4.2 g/dL (ref 3.5–5.0)
Alkaline Phosphatase: 58 U/L (ref 38–126)
Anion gap: 6 (ref 5–15)
BUN: 16 mg/dL (ref 8–23)
CO2: 31 mmol/L (ref 22–32)
Calcium: 9.3 mg/dL (ref 8.9–10.3)
Chloride: 104 mmol/L (ref 98–111)
Creatinine: 0.78 mg/dL (ref 0.44–1.00)
GFR, Estimated: >60 mL/min (ref >60)
Glucose, Bld: 108 mg/dL — ABNORMAL HIGH (ref 70–99)
Potassium: 3.6 mmol/L (ref 3.5–5.1)
Sodium: 141 mmol/L (ref 135–145)
Total Bilirubin: 0.4 mg/dL (ref 0.3–1.2)
Total Protein: 6.7 g/dL (ref 6.5–8.1)

## 2022-11-23 NOTE — Assessment & Plan Note (Signed)
08/01/2019:Palpable right breast mass for 1 month.  Mammogram revealed a 5 cm mass 8:30 position, 3 cm intramammary lymph node at 10 o'clock position and 2 enlarged right axillary lymph nodes.  Biopsy revealed grade 1 IDC ER 90%, PR 10%, Ki-67 10%, HER-2 negative.  Intramammary node and axillary lymph node both positive with similar prognostic profile   Breast MRI 08/15/2019: Biopsy-proven malignancy lower outer quadrant right breast, non-mass enhancement spanning 8.7 cm with probable invasion of underlying pectoral muscle.  Biopsy-proven metastatic lymph node in the right axillary tail.  Mild asymmetric right infraclavicular adenopathy.    Biopsy positive for invasive ductal carcinoma grade 1   CT CAP 08/24/2019: Right axillary and right retropectoral adenopathy compatible with nodal metastases.  Indistinct sclerotic 1.2 cm left acetabular lesion.  No other distant metastases  MammaPrint: Luminal type A, low risk, no benefit to chemotherapy Bone scan: Negative for metastatic disease   Treatment Summary:  1. Neoadjuvant antiestrogen therapy with anastrozole 1 mg daily started 08/08/2019 2. bilateral mastectomies 02/28/2020: Right mastectomy with ALND: IDC 6.5 cm, 7/8 lymph nodes positive; left mastectomy: Benign ER 90%, PR 10%, Ki-67 10%, HER2 negative 3. given the fact that the patient had 7 positive lymph nodes, she received dose dense Adriamycin and Cytoxan followed by Taxol completed 08/06/2020 4.  Adj radiation 09/09/2020-10/24/2020 5.  Followed by adjuvant antiestrogen therapy with letrozole and abemaciclib ----------------------------------------------------------------------------------------------------------------------------- Treatment plan: Antiestrogen therapy with anastrozole 1 mg daily (started 11/15/2020), Verzenio  started 11/14/2020    Current dose: 100 p.o. twice daily   Verzenio toxicities: 1.  Intermittent diarrhea: Resolved 2. profound decreased appetite and weight loss: Due to  Verzenio: Marked improvement with 100 mg p.o. twice daily 3.  Anemia: Hemoglobin stable at 10.4 g  4.  Elevated LFTs: Monitoring closely LFTs have normalized.   This concludes Verzinio therapy. Signatera testing: Negative Follow-up with me back in 1 year

## 2022-11-24 ENCOUNTER — Telehealth: Payer: Self-pay | Admitting: Hematology and Oncology

## 2022-11-24 NOTE — Telephone Encounter (Signed)
Per Gudena LOS 10/8 Left patient a message in regards to scheduled appointment times/dates for follow up also left callback number if needed for scheduling

## 2022-11-24 NOTE — Progress Notes (Signed)
Patient Care Team: Merri Brunette, MD as PCP - General (Internal Medicine) Serena Croissant, MD as Consulting Physician (Hematology and Oncology) Griselda Miner, MD as Consulting Physician (General Surgery) Dorothy Puffer, MD as Consulting Physician (Radiation Oncology)  DIAGNOSIS:  Encounter Diagnosis  Name Primary?   Malignant neoplasm of lower-outer quadrant of right breast of female, estrogen receptor positive (HCC) Yes    SUMMARY OF ONCOLOGIC HISTORY: Oncology History  Malignant neoplasm of lower-outer quadrant of right breast of female, estrogen receptor positive (HCC)  08/01/2019 Initial Diagnosis   Palpable right breast mass for 1 month.  Mammogram revealed a 5 cm mass 8:30 position, 3 cm intramammary lymph node at 10 o'clock position and 2 enlarged right axillary lymph nodes.  Biopsy revealed grade 1 IDC ER 90%, PR 10%, Ki-67 10%, HER-2 negative.  Intramammary node and axillary lymph node both positive with similar prognostic profile   08/08/2019 -  Neo-Adjuvant Anti-estrogen oral therapy   Anastrozole while deciding treatment plan    02/28/2020 Surgery   Bilateral mastectomies Carolynne Edouard):  Right breast: IDC, 6.5cm, metastatic carcinoma in 6/7 right axillary lymph nodes, 1.5cm, 1.5cm, and 0.3cm, and one intramammary lymph node, 2.5cm Left breast: no evidence of malignancy in the breast or 1 left axillary lymph node.   03/26/2020 - 08/06/2020 Chemotherapy   Dose dense Adriamycin and Cytoxan followed by Taxol x12   07/31/2020 Cancer Staging   Staging form: Breast, AJCC 8th Edition - Pathologic stage from 07/31/2020: Stage IB (pT3, pN2a, cM0, G1, ER+, PR+, HER2-) - Signed by Loa Socks, NP on 02/03/2021 Histologic grading system: 3 grade system   09/09/2020 - 10/24/2020 Radiation Therapy   Adjuvant radiation   11/2020 -  Anti-estrogen oral therapy    Antiestrogen therapy with anastrozole 1 mg daily (started 11/15/2020), Verzenio started 11/24/2020 (gradually escalating dose)      CHIEF COMPLIANT: Follow-up after completion of Verzinio  Discussed the use of AI scribe software for clinical note transcription with the patient, who gave verbal consent to proceed.  History of Present Illness   The patient, with a history of cancer, presents for a follow-up visit after completing her treatment. She reports no issues with her bowels, which can sometimes be loose due to the treatment. She had one instance where the treatment felt too strong, but after adjusting the dosage, she has been doing well. She has no issues with this arrangement and is open to continuing it indefinitely. She also mentions a CT cardiac scoring done last year, which yielded good results, and she expresses relief given her mother's history of heart disease.     ALLERGIES:  has No Known Allergies.  MEDICATIONS:  Current Outpatient Medications  Medication Sig Dispense Refill   anastrozole (ARIMIDEX) 1 MG tablet Take 1 tablet (1 mg total) by mouth daily. 90 tablet 3   calcium carbonate (OS-CAL) 1250 (500 Ca) MG chewable tablet Chew 1 tablet by mouth daily.     cholecalciferol (VITAMIN D3) 25 MCG (1000 UNIT) tablet Take 1,000 Units by mouth daily.     levothyroxine (SYNTHROID) 125 MCG tablet Take 1 tablet (125 mcg total) by mouth daily before breakfast. (Patient taking differently: Take 100 mcg by mouth daily before breakfast. Pt is taking 100 mcg since Dec. 13,2022 per PCP)     Multiple Vitamins-Minerals (MULTIVITAMIN WITH MINERALS) tablet Take 1 tablet by mouth daily.     mupirocin ointment (BACTROBAN) 2 % SMARTSIG:Sparingly Both Nares Twice Daily (Patient not taking: Reported on 07/23/2022)  rosuvastatin (CRESTOR) 10 MG tablet Take 1 tablet (10 mg total) by mouth daily.     triamcinolone ointment (KENALOG) 0.5 % Apply 1 Application topically 2 (two) times daily as needed. (Patient not taking: Reported on 07/23/2022) 30 g 0   No current facility-administered medications for this visit.    PHYSICAL  EXAMINATION: ECOG PERFORMANCE STATUS: 1 - Symptomatic but completely ambulatory  Vitals:   11/23/22 1516  BP: (!) 107/54  Pulse: 72  Resp: 18  Temp: 97.8 F (36.6 C)  SpO2: 99%   Filed Weights   11/23/22 1516  Weight: 154 lb 14.4 oz (70.3 kg)     LABORATORY DATA:  I have reviewed the data as listed    Latest Ref Rng & Units 11/23/2022    3:02 PM 07/21/2022    2:46 PM 04/28/2022    2:18 PM  CMP  Glucose 70 - 99 mg/dL 696  295  284   BUN 8 - 23 mg/dL 16  15  15    Creatinine 0.44 - 1.00 mg/dL 1.32  4.40  1.02   Sodium 135 - 145 mmol/L 141  139  138   Potassium 3.5 - 5.1 mmol/L 3.6  3.9  3.7   Chloride 98 - 111 mmol/L 104  104  105   CO2 22 - 32 mmol/L 31  30  25    Calcium 8.9 - 10.3 mg/dL 9.3  9.5  8.9   Total Protein 6.5 - 8.1 g/dL 6.7  6.9  7.0   Total Bilirubin 0.3 - 1.2 mg/dL 0.4  0.4  0.3   Alkaline Phos 38 - 126 U/L 58  65  63   AST 15 - 41 U/L 26  19  21    ALT 0 - 44 U/L 34  23  26     Lab Results  Component Value Date   WBC 7.6 11/23/2022   HGB 12.2 11/23/2022   HCT 35.8 (L) 11/23/2022   MCV 96.8 11/23/2022   PLT 199 11/23/2022   NEUTROABS 5.1 11/23/2022    ASSESSMENT & PLAN:  Malignant neoplasm of lower-outer quadrant of right breast of female, estrogen receptor positive (HCC) 08/01/2019:Palpable right breast mass for 1 month.  Mammogram revealed a 5 cm mass 8:30 position, 3 cm intramammary lymph node at 10 o'clock position and 2 enlarged right axillary lymph nodes.  Biopsy revealed grade 1 IDC ER 90%, PR 10%, Ki-67 10%, HER-2 negative.  Intramammary node and axillary lymph node both positive with similar prognostic profile   Breast MRI 08/15/2019: Biopsy-proven malignancy lower outer quadrant right breast, non-mass enhancement spanning 8.7 cm with probable invasion of underlying pectoral muscle.  Biopsy-proven metastatic lymph node in the right axillary tail.  Mild asymmetric right infraclavicular adenopathy.    Biopsy positive for invasive ductal carcinoma  grade 1   CT CAP 08/24/2019: Right axillary and right retropectoral adenopathy compatible with nodal metastases.  Indistinct sclerotic 1.2 cm left acetabular lesion.  No other distant metastases  MammaPrint: Luminal type A, low risk, no benefit to chemotherapy Bone scan: Negative for metastatic disease   Treatment Summary:  1. Neoadjuvant antiestrogen therapy with anastrozole 1 mg daily started 08/08/2019 2. bilateral mastectomies 02/28/2020: Right mastectomy with ALND: IDC 6.5 cm, 7/8 lymph nodes positive; left mastectomy: Benign ER 90%, PR 10%, Ki-67 10%, HER2 negative 3. given the fact that the patient had 7 positive lymph nodes, she received dose dense Adriamycin and Cytoxan followed by Taxol completed 08/06/2020 4.  Adj radiation 09/09/2020-10/24/2020 5.  Followed by  adjuvant antiestrogen therapy with letrozole and abemaciclib ----------------------------------------------------------------------------------------------------------------------------- Treatment plan: Antiestrogen therapy with anastrozole 1 mg daily (started 11/15/2020), Verzenio  started 11/14/2020    Current dose: 100 p.o. twice daily   Verzenio toxicities: 1.  Intermittent diarrhea: Resolved 2. profound decreased appetite and weight loss: Due to Verzenio: Marked improvement with 100 mg p.o. twice daily 3.  Anemia: Hemoglobin stable at 10.4 g  4.  Elevated LFTs: Monitoring closely LFTs have normalized.   This concludes Verzinio therapy. Signatera testing: Negative Follow-up with me back in 1 year   No orders of the defined types were placed in this encounter.  The patient has a good understanding of the overall plan. she agrees with it. she will call with any problems that may develop before the next visit here. Total time spent: 30 mins including face to face time and time spent for planning, charting and co-ordination of care   Tamsen Meek, MD 11/24/22

## 2022-11-29 NOTE — Telephone Encounter (Signed)
error 

## 2022-12-06 LAB — SIGNATERA
SIGNATERA MTM READOUT: 0 MTM/ml
SIGNATERA TEST RESULT: NEGATIVE

## 2022-12-07 ENCOUNTER — Other Ambulatory Visit: Payer: Self-pay

## 2022-12-07 NOTE — Progress Notes (Signed)
Therapy completed as per provider note from 10/8. Disenrolled patient.

## 2023-01-28 ENCOUNTER — Encounter: Payer: Self-pay | Admitting: Internal Medicine

## 2023-03-22 DIAGNOSIS — L821 Other seborrheic keratosis: Secondary | ICD-10-CM | POA: Diagnosis not present

## 2023-03-22 DIAGNOSIS — D0439 Carcinoma in situ of skin of other parts of face: Secondary | ICD-10-CM | POA: Diagnosis not present

## 2023-03-22 DIAGNOSIS — Z85828 Personal history of other malignant neoplasm of skin: Secondary | ICD-10-CM | POA: Diagnosis not present

## 2023-03-22 DIAGNOSIS — D225 Melanocytic nevi of trunk: Secondary | ICD-10-CM | POA: Diagnosis not present

## 2023-03-22 DIAGNOSIS — L814 Other melanin hyperpigmentation: Secondary | ICD-10-CM | POA: Diagnosis not present

## 2023-04-15 ENCOUNTER — Other Ambulatory Visit: Payer: Commercial Managed Care - PPO

## 2023-05-11 DIAGNOSIS — R3121 Asymptomatic microscopic hematuria: Secondary | ICD-10-CM | POA: Diagnosis not present

## 2023-05-18 DIAGNOSIS — R3121 Asymptomatic microscopic hematuria: Secondary | ICD-10-CM | POA: Diagnosis not present

## 2023-05-26 ENCOUNTER — Telehealth: Payer: Self-pay | Admitting: *Deleted

## 2023-05-26 DIAGNOSIS — Z170 Estrogen receptor positive status [ER+]: Secondary | ICD-10-CM

## 2023-05-26 NOTE — Telephone Encounter (Signed)
Per MD request, RN placed call to pt regarding negative (Not Detected) recent Signatera testing.  No answer, LVM for pt to return call to the office.  

## 2023-05-27 ENCOUNTER — Encounter: Payer: Self-pay | Admitting: Hematology and Oncology

## 2023-06-03 ENCOUNTER — Telehealth: Payer: Self-pay

## 2023-06-03 ENCOUNTER — Encounter: Payer: Self-pay | Admitting: Hematology and Oncology

## 2023-06-03 NOTE — Telephone Encounter (Signed)
 Called pt per MD to advise Signatera testing was negative/not detected. Pt verbalized understanding of results and knows [facility] will be in touch to schedule 3-6 mo repeat lab.

## 2023-07-06 ENCOUNTER — Other Ambulatory Visit: Payer: Self-pay | Admitting: Hematology and Oncology

## 2023-07-18 DIAGNOSIS — L011 Impetiginization of other dermatoses: Secondary | ICD-10-CM | POA: Diagnosis not present

## 2023-07-18 DIAGNOSIS — L0889 Other specified local infections of the skin and subcutaneous tissue: Secondary | ICD-10-CM | POA: Diagnosis not present

## 2023-07-18 DIAGNOSIS — L309 Dermatitis, unspecified: Secondary | ICD-10-CM | POA: Diagnosis not present

## 2023-07-18 DIAGNOSIS — D0439 Carcinoma in situ of skin of other parts of face: Secondary | ICD-10-CM | POA: Diagnosis not present

## 2023-07-18 DIAGNOSIS — Z85828 Personal history of other malignant neoplasm of skin: Secondary | ICD-10-CM | POA: Diagnosis not present

## 2023-07-21 DIAGNOSIS — R3121 Asymptomatic microscopic hematuria: Secondary | ICD-10-CM | POA: Diagnosis not present

## 2023-07-25 DIAGNOSIS — R3121 Asymptomatic microscopic hematuria: Secondary | ICD-10-CM | POA: Diagnosis not present

## 2023-07-25 DIAGNOSIS — K449 Diaphragmatic hernia without obstruction or gangrene: Secondary | ICD-10-CM | POA: Diagnosis not present

## 2023-07-25 DIAGNOSIS — R3129 Other microscopic hematuria: Secondary | ICD-10-CM | POA: Diagnosis not present

## 2023-08-11 DIAGNOSIS — R3121 Asymptomatic microscopic hematuria: Secondary | ICD-10-CM | POA: Diagnosis not present

## 2023-11-07 DIAGNOSIS — Z01419 Encounter for gynecological examination (general) (routine) without abnormal findings: Secondary | ICD-10-CM | POA: Diagnosis not present

## 2023-11-07 DIAGNOSIS — Z6824 Body mass index (BMI) 24.0-24.9, adult: Secondary | ICD-10-CM | POA: Diagnosis not present

## 2023-11-16 ENCOUNTER — Other Ambulatory Visit: Payer: Self-pay | Admitting: *Deleted

## 2023-11-16 DIAGNOSIS — Z170 Estrogen receptor positive status [ER+]: Secondary | ICD-10-CM

## 2023-11-16 NOTE — Progress Notes (Signed)
 Signatera renewal orders placed per MD request.

## 2023-11-28 ENCOUNTER — Inpatient Hospital Stay: Payer: Commercial Managed Care - PPO | Attending: Hematology and Oncology | Admitting: Hematology and Oncology

## 2023-11-28 VITALS — BP 128/64 | HR 75 | Temp 97.7°F | Resp 17 | Wt 148.6 lb

## 2023-11-28 DIAGNOSIS — Z9013 Acquired absence of bilateral breasts and nipples: Secondary | ICD-10-CM | POA: Insufficient documentation

## 2023-11-28 DIAGNOSIS — Z170 Estrogen receptor positive status [ER+]: Secondary | ICD-10-CM | POA: Insufficient documentation

## 2023-11-28 DIAGNOSIS — C778 Secondary and unspecified malignant neoplasm of lymph nodes of multiple regions: Secondary | ICD-10-CM | POA: Insufficient documentation

## 2023-11-28 DIAGNOSIS — Z780 Asymptomatic menopausal state: Secondary | ICD-10-CM

## 2023-11-28 DIAGNOSIS — Z79811 Long term (current) use of aromatase inhibitors: Secondary | ICD-10-CM | POA: Insufficient documentation

## 2023-11-28 DIAGNOSIS — Z1732 Human epidermal growth factor receptor 2 negative status: Secondary | ICD-10-CM | POA: Insufficient documentation

## 2023-11-28 DIAGNOSIS — C50511 Malignant neoplasm of lower-outer quadrant of right female breast: Secondary | ICD-10-CM | POA: Insufficient documentation

## 2023-11-28 DIAGNOSIS — M8580 Other specified disorders of bone density and structure, unspecified site: Secondary | ICD-10-CM | POA: Insufficient documentation

## 2023-11-28 DIAGNOSIS — Z1721 Progesterone receptor positive status: Secondary | ICD-10-CM | POA: Insufficient documentation

## 2023-11-28 DIAGNOSIS — Z79899 Other long term (current) drug therapy: Secondary | ICD-10-CM | POA: Insufficient documentation

## 2023-11-28 NOTE — Progress Notes (Signed)
 Patient Care Team: Clarice Nottingham, MD as PCP - General (Internal Medicine) Odean Potts, MD as Consulting Physician (Hematology and Oncology) Curvin Deward MOULD, MD as Consulting Physician (General Surgery) Dewey Rush, MD as Consulting Physician (Radiation Oncology)  DIAGNOSIS:  Encounter Diagnosis  Name Primary?   Malignant neoplasm of lower-outer quadrant of right breast of female, estrogen receptor positive (HCC) Yes    SUMMARY OF ONCOLOGIC HISTORY: Oncology History  Malignant neoplasm of lower-outer quadrant of right breast of female, estrogen receptor positive (HCC)  08/01/2019 Initial Diagnosis   Palpable right breast mass for 1 month.  Mammogram revealed a 5 cm mass 8:30 position, 3 cm intramammary lymph node at 10 o'clock position and 2 enlarged right axillary lymph nodes.  Biopsy revealed grade 1 IDC ER 90%, PR 10%, Ki-67 10%, HER-2 negative.  Intramammary node and axillary lymph node both positive with similar prognostic profile   08/08/2019 -  Neo-Adjuvant Anti-estrogen oral therapy   Anastrozole  while deciding treatment plan    02/28/2020 Surgery   Bilateral mastectomies [facility]):  Right breast: IDC, 6.5cm, metastatic carcinoma in 6/7 right axillary lymph nodes, 1.5cm, 1.5cm, and 0.3cm, and one intramammary lymph node, 2.5cm Left breast: no evidence of malignancy in the breast or 1 left axillary lymph node.   03/26/2020 - 08/06/2020 Chemotherapy   Dose dense Adriamycin  and Cytoxan  followed by Taxol  x12   07/31/2020 Cancer Staging   Staging form: Breast, AJCC 8th Edition - Pathologic stage from 07/31/2020: Stage IB (pT3, pN2a, cM0, G1, ER+, PR+, HER2-) - Signed by Crawford Morna Pickle, NP on 02/03/2021 Histologic grading system: 3 grade system   09/09/2020 - 10/24/2020 Radiation Therapy   Adjuvant radiation   11/2020 -  Anti-estrogen oral therapy    Antiestrogen therapy with anastrozole  1 mg daily (started 11/15/2020), Verzenio  started 11/24/2020 (gradually escalating dose)      CHIEF COMPLIANT:   HISTORY OF PRESENT ILLNESS:  History of Present Illness [name] is a 68 year old female with a history of breast cancer who presents for routine follow-up.  She completed treatment with Verzenio  a year ago and continues to take anastrozole  without any issues. Anastrozole  was started approximately six months before her surgery in January 2022, around June 2021. She has been on anastrozole  for several years and is tolerating it well, with no side effects.  A bone density scan in December 2022 at the breast center showed a T-score of minus 1.5. She is due for another bone density scan and is considering alternative locations for the scan.  She continues to have regular blood draws and recently completed a Neutera test last week. The results are pending, but previous tests have been satisfactory.     ALLERGIES:  has no known allergies.  MEDICATIONS:  Current Outpatient Medications  Medication Sig Dispense Refill   anastrozole  (ARIMIDEX ) 1 MG tablet Take 1 tablet (1 mg total) by mouth daily. 90 tablet 3   calcium  carbonate (OS-CAL) 1250 (500 Ca) MG chewable tablet Chew 1 tablet by mouth daily.     cholecalciferol (VITAMIN D3) 25 MCG (1000 UNIT) tablet Take 1,000 Units by mouth daily.     levothyroxine  (SYNTHROID ) 125 MCG tablet Take 1 tablet (125 mcg total) by mouth daily before breakfast.     Multiple Vitamins-Minerals (MULTIVITAMIN WITH MINERALS) tablet Take 1 tablet by mouth daily.     rosuvastatin  (CRESTOR ) 10 MG tablet Take 1 tablet (10 mg total) by mouth daily.     mupirocin ointment (BACTROBAN) 2 % SMARTSIG:Sparingly Both  Nares Twice Daily (Patient not taking: Reported on 11/28/2023)     triamcinolone  ointment (KENALOG ) 0.5 % Apply 1 Application topically 2 (two) times daily as needed. (Patient not taking: Reported on 11/28/2023) 30 g 0   No current facility-administered medications for this visit.    PHYSICAL EXAMINATION: ECOG PERFORMANCE STATUS:  1 - Symptomatic but completely ambulatory  Vitals:   11/28/23 1524  BP: 128/64  Pulse: 75  Resp: 17  Temp: 97.7 F (36.5 C)  SpO2: 100%   Filed Weights   11/28/23 1524  Weight: 148 lb 9.6 oz (67.4 kg)    Physical Exam   (exam performed in the presence of a chaperone)  LABORATORY DATA:  I have reviewed the data as listed    Latest Ref Rng & Units 11/23/2022    3:02 PM 07/21/2022    2:46 PM 04/28/2022    2:18 PM  CMP  Glucose 70 - 99 mg/dL 891  891  853   BUN 8 - 23 mg/dL 16  15  15    Creatinine 0.44 - 1.00 mg/dL 9.21  8.97  9.06   Sodium 135 - 145 mmol/L 141  139  138   Potassium 3.5 - 5.1 mmol/L 3.6  3.9  3.7   Chloride 98 - 111 mmol/L 104  104  105   CO2 22 - 32 mmol/L 31  30  25    Calcium  8.9 - 10.3 mg/dL 9.3  9.5  8.9   Total Protein 6.5 - 8.1 g/dL 6.7  6.9  7.0   Total Bilirubin 0.3 - 1.2 mg/dL 0.4  0.4  0.3   Alkaline Phos 38 - 126 U/L 58  65  63   AST 15 - 41 U/L 26  19  21    ALT 0 - 44 U/L 34  23  26     Lab Results  Component Value Date   WBC 7.6 11/23/2022   HGB 12.2 11/23/2022   HCT 35.8 (L) 11/23/2022   MCV 96.8 11/23/2022   PLT 199 11/23/2022   NEUTROABS 5.1 11/23/2022    ASSESSMENT & PLAN:  Malignant neoplasm of lower-outer quadrant of right breast of female, estrogen receptor positive (HCC) 08/01/2019:Palpable right breast mass for 1 month.  Mammogram revealed a 5 cm mass 8:30 position, 3 cm intramammary lymph node at 10 o'clock position and 2 enlarged right axillary lymph nodes.  Biopsy revealed grade 1 IDC ER 90%, PR 10%, Ki-67 10%, HER-2 negative.  Intramammary node and axillary lymph node both positive with similar prognostic profile   Breast MRI 08/15/2019: Biopsy-proven malignancy lower outer quadrant right breast, non-mass enhancement spanning 8.7 cm with probable invasion of underlying pectoral muscle.  Biopsy-proven metastatic lymph node in the right axillary tail.  Mild asymmetric right infraclavicular adenopathy.    Biopsy positive for  invasive ductal carcinoma grade 1   CT CAP 08/24/2019: Right axillary and right retropectoral adenopathy compatible with nodal metastases.  Indistinct sclerotic 1.2 cm left acetabular lesion.  No other distant metastases  MammaPrint: Luminal type A, low risk, no benefit to chemotherapy Bone scan: Negative for metastatic disease   Treatment Summary:  1. Neoadjuvant antiestrogen therapy with anastrozole  1 mg daily started 08/08/2019 2. bilateral mastectomies 02/28/2020: Right mastectomy with ALND: IDC 6.5 cm, 7/8 lymph nodes positive; left mastectomy: Benign ER 90%, PR 10%, Ki-67 10%, HER2 negative 3. given the fact that the patient had 7 positive lymph nodes, she received dose dense Adriamycin  and Cytoxan  followed by Taxol  completed 08/06/2020 4.  Adj  radiation 09/09/2020-10/24/2020 5.  Followed by adjuvant antiestrogen therapy with letrozole (started 11/15/2020) and abemaciclib  (completed 11/24/2022 ----------------------------------------------------------------------------------------------------------------------------- Treatment plan: Antiestrogen therapy with anastrozole  1 mg daily (started 11/15/2020), Verzenio   started 11/14/2020 completed 11/24/2022   Anastrozole  toxicities:  Signatera testing: Negative  Breast cancer surveillance:  Chest exam 11/28/2023: Benign No role for imaging since she had bilateral mastectomies  Return to clinic in 1 year for follow-up    Assessment & Plan Estrogen receptor positive malignant neoplasm of lower-outer quadrant of right breast Estrogen receptor positive breast cancer, on anastrozole  since diagnosis with good tolerance. Completed Verzenio  a year ago. No current need for scans or phentermine if Neutera is negative. - Continue anastrozole  therapy. - Renew anastrozole  prescription for another year. - Monitor Neutera results; no scans or phentermine needed if negative.  Osteopenia Osteopenia with T-score of -1.5, requires monitoring due to anastrozole   therapy. - Order bone density scan at [facility] location. - Schedule bone density scan in approximately three months.      No orders of the defined types were placed in this encounter.  The patient has a good understanding of the overall plan. she agrees with it. she will call with any problems that may develop before the next visit here.  I personally spent a total of 30 minutes in the care of the patient today including preparing to see the patient, getting/reviewing separately obtained history, performing a medically appropriate exam/evaluation, counseling and educating, placing orders, referring and communicating with other health care professionals, documenting clinical information in the EHR, independently interpreting results, communicating results, and coordinating care.   Viinay K Cinthia Rodden, MD 11/28/23

## 2023-11-28 NOTE — Assessment & Plan Note (Signed)
 08/01/2019:Palpable right breast mass for 1 month.  Mammogram revealed a 5 cm mass 8:30 position, 3 cm intramammary lymph node at 10 o'clock position and 2 enlarged right axillary lymph nodes.  Biopsy revealed grade 1 IDC ER 90%, PR 10%, Ki-67 10%, HER-2 negative.  Intramammary node and axillary lymph node both positive with similar prognostic profile   Breast MRI 08/15/2019: Biopsy-proven malignancy lower outer quadrant right breast, non-mass enhancement spanning 8.7 cm with probable invasion of underlying pectoral muscle.  Biopsy-proven metastatic lymph node in the right axillary tail.  Mild asymmetric right infraclavicular adenopathy.    Biopsy positive for invasive ductal carcinoma grade 1   CT CAP 08/24/2019: Right axillary and right retropectoral adenopathy compatible with nodal metastases.  Indistinct sclerotic 1.2 cm left acetabular lesion.  No other distant metastases  MammaPrint: Luminal type A, low risk, no benefit to chemotherapy Bone scan: Negative for metastatic disease   Treatment Summary:  1. Neoadjuvant antiestrogen therapy with anastrozole  1 mg daily started 08/08/2019 2. bilateral mastectomies 02/28/2020: Right mastectomy with ALND: IDC 6.5 cm, 7/8 lymph nodes positive; left mastectomy: Benign ER 90%, PR 10%, Ki-67 10%, HER2 negative 3. given the fact that the patient had 7 positive lymph nodes, she received dose dense Adriamycin  and Cytoxan  followed by Taxol  completed 08/06/2020 4.  Adj radiation 09/09/2020-10/24/2020 5.  Followed by adjuvant antiestrogen therapy with letrozole (started 11/15/2020) and abemaciclib  (completed 11/24/2022 ----------------------------------------------------------------------------------------------------------------------------- Treatment plan: Antiestrogen therapy with anastrozole  1 mg daily (started 11/15/2020), Verzenio   started 11/14/2020 completed 11/24/2022   Anastrozole  toxicities:  Signatera testing: Negative  Breast cancer surveillance:  Chest exam  11/28/2023: Benign No role for imaging since she had bilateral mastectomies  Return to clinic in 1 year for follow-up Follow-up with me back in 1 year

## 2023-11-30 LAB — SIGNATERA ONLY (NATERA MANAGED)
SIGNATERA MTM READOUT: 0 MTM/ml
SIGNATERA TEST RESULT: NEGATIVE

## 2024-03-12 ENCOUNTER — Other Ambulatory Visit: Payer: Self-pay

## 2024-11-27 ENCOUNTER — Inpatient Hospital Stay: Payer: Self-pay | Admitting: Hematology and Oncology
